# Patient Record
Sex: Female | Born: 1940 | Race: White | Hispanic: No | Marital: Married | Smoking: Former smoker
Health system: Southern US, Community
[De-identification: ages and names within clinical notes are randomized; demographics above are authoritative.]

## PROBLEM LIST (undated history)

## (undated) DIAGNOSIS — M7052 Other bursitis of knee, left knee: Secondary | ICD-10-CM

## (undated) DIAGNOSIS — N189 Chronic kidney disease, unspecified: Secondary | ICD-10-CM

## (undated) DIAGNOSIS — Z974 Presence of external hearing-aid: Secondary | ICD-10-CM

## (undated) DIAGNOSIS — Z87898 Personal history of other specified conditions: Secondary | ICD-10-CM

## (undated) DIAGNOSIS — I10 Essential (primary) hypertension: Secondary | ICD-10-CM

## (undated) DIAGNOSIS — S81002A Unspecified open wound, left knee, initial encounter: Secondary | ICD-10-CM

## (undated) DIAGNOSIS — E785 Hyperlipidemia, unspecified: Secondary | ICD-10-CM

## (undated) DIAGNOSIS — K5730 Diverticulosis of large intestine without perforation or abscess without bleeding: Secondary | ICD-10-CM

## (undated) DIAGNOSIS — K589 Irritable bowel syndrome without diarrhea: Secondary | ICD-10-CM

## (undated) DIAGNOSIS — Z85828 Personal history of other malignant neoplasm of skin: Secondary | ICD-10-CM

## (undated) DIAGNOSIS — E041 Nontoxic single thyroid nodule: Secondary | ICD-10-CM

## (undated) DIAGNOSIS — K219 Gastro-esophageal reflux disease without esophagitis: Secondary | ICD-10-CM

## (undated) DIAGNOSIS — Z9889 Other specified postprocedural states: Secondary | ICD-10-CM

## (undated) DIAGNOSIS — T7840XA Allergy, unspecified, initial encounter: Secondary | ICD-10-CM

## (undated) DIAGNOSIS — M810 Age-related osteoporosis without current pathological fracture: Secondary | ICD-10-CM

## (undated) DIAGNOSIS — Z87440 Personal history of urinary (tract) infections: Secondary | ICD-10-CM

## (undated) DIAGNOSIS — M1990 Unspecified osteoarthritis, unspecified site: Secondary | ICD-10-CM

## (undated) DIAGNOSIS — H269 Unspecified cataract: Secondary | ICD-10-CM

## (undated) HISTORY — PX: POSTERIOR REPAIR: SHX2254

## (undated) HISTORY — DX: Age-related osteoporosis without current pathological fracture: M81.0

## (undated) HISTORY — DX: Allergy, unspecified, initial encounter: T78.40XA

## (undated) HISTORY — PX: EXCISION MORTON'S NEUROMA: SHX5013

## (undated) HISTORY — DX: Irritable bowel syndrome, unspecified: K58.9

## (undated) HISTORY — DX: Chronic kidney disease, unspecified: N18.9

## (undated) HISTORY — PX: TRANSTHORACIC ECHOCARDIOGRAM: SHX275

## (undated) HISTORY — PX: APPENDECTOMY: SHX54

## (undated) HISTORY — PX: SPINE SURGERY: SHX786

## (undated) HISTORY — DX: Unspecified cataract: H26.9

## (undated) HISTORY — DX: Hyperlipidemia, unspecified: E78.5

## (undated) HISTORY — DX: Gastro-esophageal reflux disease without esophagitis: K21.9

## (undated) HISTORY — PX: EYE SURGERY: SHX253

## (undated) HISTORY — PX: KNEE CARTILAGE SURGERY: SHX688

## (undated) HISTORY — DX: Essential (primary) hypertension: I10

## (undated) HISTORY — PX: COLONOSCOPY: SHX174

## (undated) HISTORY — PX: JOINT REPLACEMENT: SHX530

## (undated) HISTORY — PX: TONSILLECTOMY AND ADENOIDECTOMY: SUR1326

---

## 1960-02-06 HISTORY — PX: BREAST BIOPSY: SHX20

## 1977-02-05 HISTORY — PX: VAGINAL HYSTERECTOMY: SUR661

## 1994-02-05 HISTORY — PX: KNEE ARTHROSCOPY W/ ACL RECONSTRUCTION: SHX1858

## 1998-03-29 ENCOUNTER — Ambulatory Visit (HOSPITAL_COMMUNITY): Admission: RE | Admit: 1998-03-29 | Discharge: 1998-03-29 | Payer: Self-pay | Admitting: Obstetrics & Gynecology

## 1999-06-14 ENCOUNTER — Other Ambulatory Visit: Admission: RE | Admit: 1999-06-14 | Discharge: 1999-06-14 | Payer: Self-pay | Admitting: Orthopedic Surgery

## 2002-05-21 ENCOUNTER — Ambulatory Visit (HOSPITAL_COMMUNITY): Admission: RE | Admit: 2002-05-21 | Discharge: 2002-05-21 | Payer: Self-pay | Admitting: Obstetrics and Gynecology

## 2003-03-09 ENCOUNTER — Ambulatory Visit (HOSPITAL_COMMUNITY): Admission: RE | Admit: 2003-03-09 | Discharge: 2003-03-09 | Payer: Self-pay | Admitting: Family Medicine

## 2003-04-29 ENCOUNTER — Ambulatory Visit (HOSPITAL_COMMUNITY): Admission: RE | Admit: 2003-04-29 | Discharge: 2003-04-29 | Payer: Self-pay | Admitting: Obstetrics and Gynecology

## 2006-04-03 ENCOUNTER — Ambulatory Visit: Payer: Self-pay | Admitting: Cardiovascular Disease

## 2007-05-22 ENCOUNTER — Ambulatory Visit: Payer: Self-pay | Admitting: Cardiovascular Disease

## 2008-01-09 ENCOUNTER — Ambulatory Visit: Payer: Self-pay | Admitting: Internal Medicine

## 2008-01-20 ENCOUNTER — Ambulatory Visit: Payer: Self-pay | Admitting: Internal Medicine

## 2010-04-12 ENCOUNTER — Ambulatory Visit (HOSPITAL_COMMUNITY)
Admission: RE | Admit: 2010-04-12 | Discharge: 2010-04-12 | Disposition: A | Discharge status: Home / Self Care | Payer: MEDICARE | Source: Ambulatory Visit | Attending: Family Medicine | Admitting: Family Medicine

## 2010-04-12 DIAGNOSIS — M79609 Pain in unspecified limb: Secondary | ICD-10-CM | POA: Insufficient documentation

## 2010-04-12 DIAGNOSIS — M6281 Muscle weakness (generalized): Secondary | ICD-10-CM | POA: Insufficient documentation

## 2010-04-12 DIAGNOSIS — R262 Difficulty in walking, not elsewhere classified: Secondary | ICD-10-CM | POA: Insufficient documentation

## 2010-04-12 DIAGNOSIS — IMO0001 Reserved for inherently not codable concepts without codable children: Secondary | ICD-10-CM | POA: Insufficient documentation

## 2010-04-19 ENCOUNTER — Ambulatory Visit (HOSPITAL_COMMUNITY)
Admission: RE | Admit: 2010-04-19 | Discharge: 2010-04-19 | Disposition: A | Discharge status: Home / Self Care | Payer: MEDICARE | Source: Ambulatory Visit | Attending: *Deleted | Admitting: *Deleted

## 2010-04-26 ENCOUNTER — Ambulatory Visit (HOSPITAL_COMMUNITY)
Admission: RE | Admit: 2010-04-26 | Discharge: 2010-04-26 | Disposition: A | Discharge status: Home / Self Care | Payer: MEDICARE | Source: Ambulatory Visit | Attending: *Deleted | Admitting: *Deleted

## 2010-05-01 ENCOUNTER — Ambulatory Visit (HOSPITAL_COMMUNITY)
Admission: RE | Admit: 2010-05-01 | Discharge: 2010-05-01 | Disposition: A | Discharge status: Home / Self Care | Payer: MEDICARE | Source: Ambulatory Visit | Attending: *Deleted | Admitting: *Deleted

## 2010-05-08 ENCOUNTER — Ambulatory Visit (HOSPITAL_COMMUNITY)
Admission: RE | Admit: 2010-05-08 | Discharge: 2010-05-08 | Disposition: A | Discharge status: Home / Self Care | Payer: MEDICARE | Source: Ambulatory Visit | Attending: Family Medicine | Admitting: Family Medicine

## 2010-05-08 DIAGNOSIS — M79609 Pain in unspecified limb: Secondary | ICD-10-CM | POA: Insufficient documentation

## 2010-05-08 DIAGNOSIS — R262 Difficulty in walking, not elsewhere classified: Secondary | ICD-10-CM | POA: Insufficient documentation

## 2010-05-08 DIAGNOSIS — IMO0001 Reserved for inherently not codable concepts without codable children: Secondary | ICD-10-CM | POA: Insufficient documentation

## 2010-05-08 DIAGNOSIS — M6281 Muscle weakness (generalized): Secondary | ICD-10-CM | POA: Insufficient documentation

## 2010-06-20 NOTE — Assessment & Plan Note (Signed)
[facility]                       [place] CARDIOLOGY OFFICE NOTE   NAME:[name], [name]                      MRN:          [id number]  DATE:05/22/2007                            DOB:          12-Feb-1940    [name] is seen today in follow up.  She has had previous atypical  chest pain.  She had a cardiac CT at [facility] on Jun 21, 2005, and I was able to finally review the report and get a disk of the  images   She had a right dominant circulation with no significant coronary artery  disease, coronary calcium scoring was not performed.  There were no  significant noncardiac findings. The study was read by Titus Mould.   Since I saw the patient, last, she has had 2 episodes of atypical chest  pain.  They occurred at night.  There were in recumbency.  They may  represent reflux.  They were kind of a sharp, burning-type pain.  They  lasted for less than an hour.   She has not had any problems with exercise, in fact, she and [name] husband  are going to be doing some biking around [employer] soon.  She has otherwise been active.   She has not any significant PND or orthopnea.  No lower extremity edema,  no palpitations or syncope.   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  She has had hypertension.  She has been watching [name] blood  pressure at home and they been stable on low-dose beta blocker; and we  have not had to add medication.   REVIEW OF SYSTEMS:  Otherwise negative.   MEDS INCLUDE:  1. Premarin 0.45 three times a week.  2. Calcium.  3. Glucosamine.  4. Metoprolol 25 b.i.d.  5. Baby aspirin a day.   PHYSICAL EXAMINATION:  [name] exam is remarkable for a healthy-appearing  elderly white female in no distress.  Weight is 128. blood pressure 134/80, pulse 76 and regular, afebrile,  respiratory rate 14.  HEENT:  Unremarkable.  Carotids normal without bruit.  No  lymphadenopathy, no thyromegaly, nor  JVP elevation.  LUNGS:  Clear, good diaphragmatic motion.  No wheezing.  S1 and S2 normal heart sounds.  PMI normal.  ABDOMEN:  Benign bowel sounds.  Positive AAA.  No tenderness.  No bruit.  No hepatosplenomegaly, no hepatojugular reflux.  Distal pulses are intact.  No edema.  NEURO:  Nonfocal.  SKIN:  Warm and dry.  No muscular weakness.   IMPRESSION:  1. Atypical chest pain.  Follow up stress echo.  I told the patient I      do not want to do a Myoview or a CT on [name], due to radiation      dosages.  I would also like to see which she does with exercise.      She has had some nonspecific lateral ST-segment changes in the      past, and she will need an imaging modality; and, I think, echo is      the  most reasonable  2. History of supraventricular tachycardia, quiescent.  Continue beta      blockers.  3. Borderline hypertension, currently stable.  Continue beta blocker      may need to add ACE inhibitor in the future.   So long as [name] stress echo is normal, she will be seen back in a year.     Noralyn Pick. Eden Emms, MD, [facility]  Electronically Signed    PCN/MedQ  DD: 05/22/2007  DT: 05/22/2007  Job #: [id number]

## 2010-06-23 NOTE — Assessment & Plan Note (Signed)
[facility]                       [place] CARDIOLOGY OFFICE NOTE   NAME:HAIGHTEmmilia, Sowder                      MRN:          [id number]  DATE:04/03/2006                            DOB:          08-21-1940    Ms. Salih is seen today as a new consult to me.  She is referred by Dr.  Lilyan Punt and Dr. Stephani Police, where she works at [employer].  She has previously been seen approximately 3 years ago by  Dr. Dorethea Clan.  She has history of palpitations and a brief episode of SVT.  She has been on longstanding beta blockers.  The patient is very  intelligent.  She had multiple questions today.  Her palpitations only  occur maybe once every month or two.  She has had a previous Holter  monitor.  She feels that her palpitations are stable.  She has not had  any syncope, chest pain, PND, or orthopnea.   I talked to her at length about new technology including CardioNet  monitoring should her palpitations increase in frequency.  She asked me  if her beta blocker was at a correct dose, and I told her since her  resting heart rate was in the 60s, I thought that it was.   Coronary risk factors include borderline hypertension.   FAMILY HISTORY:  Negative.   She is a nondiabetic and nonsmoker.   The patient otherwise has been doing well.   PAST MEDICAL HISTORY:  Remarkable for:  1. Some diverticulitis.  2. Previous breast biopsy.  3. Tonsillectomy.  4. She had a right ACL repair in 1996.  5. Previous hysterectomy.  6. Appendectomy.   SHE HAS NO KNOWN ALLERGIES.   REVIEW OF SYSTEMS:  As indicated above with no syncope, stable  palpitations, and no chest pain.   Her medicines include:  1. Premarin 0.5 mg a day.  2. Calcium.  3. Glucosamine.  4. Lopressor 25 b.i.d.   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  She is happily  married.  She has 2 older children in their 73s who are doing well.  She  continues to be active.   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   PHYSICAL EXAMINATION:  She looks well.  Blood pressure is 130/80.  Pulse is 68 and regular.  HEENT:  Normal.  LUNGS:  Clear.  Carotids are normal without bruit.  There is no lymphadenopathy.  ABDOMEN:  Benign.  There is no hepatosplenomegaly.  No AAA.  Distal pulses intact with no edema.  She is status post appendectomy, hysterectomy and right knee surgery.   IMPRESSION:  The patient's palpitations appear benign.  She has a  documented episode of supraventricular tachycardia by Holter previously.  She will continue her beta blocker at her current dose.  I told her she  could switch to Toprol 50 if she wanted a once-a-day drug, however, this  was not generic.   The patient appears to be borderline hypertensive.  She had some blood  pressure readings at home that are also borderline.  I told her to cut  out the salt in her diet.  She will increase her activity levels.  If  she runs over 150/90 consistently, I think she should probably be  started on Lisinopril 10 mg or Altace 5 mg.  She will call us after  monitoring her blood pressures at home.   The patient did have a cardiac CTA and calcium scoring with Dr. Katrinka Blazing  over at [facility] last fall.  I suspect that she was one of  the test patients when they started their program.  I asked her what her  calcium score was, and she did not know.  She will try to bring the  report by for me to review.  I told her if she really wanted to know  what her 5 and 10 year prognosis was, that the cardiac CTA would be the  best exam for me to be able to tell her this.   She will have fasting lipid profile checked here in our office, as this  has not been done in over 4 years.  I did tell her it would probably be  reasonable for her to take a baby aspirin a day for cardiovascular and  stroke prophylaxis.   It was a pleasure meeting Ms.  Bostock today.     Noralyn Pick. Eden Emms, MD, [facility]  Electronically Signed    PCN/MedQ  DD: 04/03/2006  DT: 04/03/2006  Job #: [id number]

## 2010-06-23 NOTE — Procedures (Signed)
NAME:  SHARETA, FISHBAUGH                         ACCOUNT NO.:  [id number]   MEDICAL RECORD NO.:  [id number]                   PATIENT TYPE:  OUT   LOCATION:  RAD                                  FACILITY:  [facility]   PHYSICIAN:  [place] Bing, M.D.               DATE OF BIRTH:  08-05-1940   DATE OF PROCEDURE:  DATE OF DISCHARGE:                                  ECHOCARDIOGRAM   REFERRING PHYSICIAN:  1. Dr. Lorin Picket A. Luking.  2. Dr. Tressia Miners.   CLINICAL DATA:  A 70 year old woman with chest pain.   M-MODE:  1. Aorta 2.8.  2. Left atrium 3.8.  3. Septum 1.2.  4. Posterior wall 1.0.  5. LV diastole 3.5.  6. LV systole 2.6.   FINDINGS:  1. Technically adequate echocardiographic study.  2. Normal left atrium, right atrium, and right ventricle.  3. Mild aortic valvular sclerosis and annular calcification.  4. Normal valve function.  5. Normal tricuspid and mitral valve; very mild mitral annular     calcification; trivial tricuspid regurgitation.  6. Normal internal dimension of the left ventricle; borderline LVH.  7. Normal regional and global LV systolic function.  8. Normal inferior vena cava.      ___________________________________________                                            [place] Bing, M.D.   RR/MEDQ  D:  04/29/2003  T:  04/29/2003  Job:  [id number]

## 2010-06-23 NOTE — Op Note (Signed)
   NAME:  [name], [name]                         ACCOUNT NO.:  [id number]   MEDICAL RECORD NO.:  [id number]                   PATIENT TYPE:  AMB   LOCATION:  DAY                                  FACILITY:  [facility]   PHYSICIAN:  Tilda Burrow, M.D.              DATE OF BIRTH:  [birth year]   DATE OF PROCEDURE:  DATE OF DISCHARGE:                                 OPERATIVE REPORT   PREOPERATIVE DIAGNOSIS:  Rectocele.   POSTOPERATIVE DIAGNOSIS:  Rectocele.   OPERATION/PROCEDURE:  Posterior repair.   ASSISTANT:  None.   ANESTHESIA:  General.   COMPLICATIONS:  None.   FINDINGS:  A small high rectocele easily reapproximated.   DESCRIPTION OF PROCEDURE:  The patient was prepped and draped for vaginal  procedure with the legs in the candy cane supports.  The vaginal epithelium  was dissected off the underlying connective tissue with a doubly gloved  right index finger placed into the rectum.  We were able to identify good  perirectal support tissue that could be pulled out with a series of  interrupted 0 Dexon pop off sutures to reapproximate and rebuild the  peroneal body.  These were tied down with a series of approximately eight  stitches.  This resulted in good peroneal body allotment.  We then proceeded  to rebuild the peroneal body itself and reduce the introitus size by  dissecting beneath the skin at the level of the hymen remnants, identify  tissues adjacent to the bulbo cavernosus muscles and pulling together also  with 0 Dexon pop off sutures.  The vaginal mucosa was trimmed slightly and  then reapproximated.  There was excellent hemostasis and no vaginal packing  was necessary and the patient had only in and out catheterization.  She will  be allowed to go home today.                                                Tilda Burrow, M.D.    JVF/MEDQ  D:  05/21/2002  T:  05/21/2002  Job:  [id number]

## 2010-06-23 NOTE — Procedures (Signed)
NAME:  [name], [name] NO.:  [id number]   MEDICAL RECORD NO.:  [id number]                   PATIENT TYPE:  OUT   LOCATION:  DFTL                                 FACILITY:  [facility]   PHYSICIAN:  Donna Bernard, M.D.             DATE OF BIRTH:  1940-10-30   DATE OF PROCEDURE:  DATE OF DISCHARGE:  03/09/2003                                    STRESS TEST   INDICATION FOR TEST:  Chest discomfort in a patient with a positive family  history for heart disease.   STRESS TEST:  Stress test was performed at standard __________ protocol.  Resting EKG revealed normal sinus rhythm with no significant ST-T changes.  The patient tolerated the first stage well.  There was normal hypertensive  response.  On into the second stage, the patient reached and surpassed her  __________ predicted heart rate of 133.  The patient, in fact, achieved a  maximum heart rate of 160.  This was achieved right at the cessation of the  test at the start of the third stage.  At 0.08 seconds past the J point,  there were no significant ST segment changes noted.  In areas where some  depression was evident the slope was nicely ascending.  During the rest  phase the ST segments remained within normal limits.   IMPRESSION:  Negative adequate stress test.   PLAN:  The patient was encouraged to get with exercise program.      ___________________________________________                                            Donna Bernard, M.D.   WSL/MEDQ  D:  03/16/2003  T:  03/17/2003  Job:  [id number]

## 2010-06-23 NOTE — Op Note (Signed)
NAME:  [name], [name]                         ACCOUNT NO.:  [id number]   MEDICAL RECORD NO.:  [id number]                   PATIENT TYPE:  AMB   LOCATION:  DAY                                  FACILITY:  [facility]   PHYSICIAN:  Tilda Burrow, M.D.              DATE OF BIRTH:  1940-11-03   DATE OF PROCEDURE:  05/21/2002  DATE OF DISCHARGE:                                 OPERATIVE REPORT   PREOPERATIVE DIAGNOSIS:  Rectocele.   POSTOPERATIVE DIAGNOSIS:  Rectocele.   PROCEDURE:  Posterior vaginal repair.   SURGEON:  Tilda Burrow, M.D.   ASSISTANTCurley Spice.   ANESTHESIA:  General with laryngeal mask anesthesia.   COMPLICATIONS:  None.   FINDINGS:  A small, high rectocele, corrected nicely.   DESCRIPTION OF PROCEDURE:  The patient was taken to the operating room and  prepped and draped for vaginal procedure with legs in the candy cane  support, with general anesthesia easily introduced.  A scopolamine patch was  placed to assist with postoperative nausea due to prior experiences.   After prepping and draping of the vagina, an Allis clamp was used to grasp  the hymen remnants on either side of the old episiotomy.  The posterior  vaginal epithelium was infiltrated with Xylocaine with epinephrine and a  transverse incision made at the level of the hymen remnant.  We then  dissected the vaginal mucosa off the underlying connective tissue and  rectocele.  Digital rectal exam was then performed with a double-gloved  right index finger and then Allis clamps used to grasp the lateral  pararectal connective tissue on either side that could be pulled into the  midline and overlie the rectocele defect.  These were pulled together with a  series of interrupted 0 Dexon sutures.  The gloved hand was removed and  changed and then tied down with good posterior perineal support achieved.  Vaginal apex was soft and flexible.  Vaginal length was not shortened.   Introitus was reduced in  size somewhat with the placement of two additional  stitches at the level of the hymen remnant that pulled together tissue from  either side of the old episiotomy scar.  The procedure was so hemostatic  that vaginal packing was not warranted.  The vaginal epithelium was pulled  together with interrupted 2-0 chromic sutures and the procedure considered  completed successfully.  In-and-out catheterization obtained approximately  100 mL of clear urine.  The patient will go home from day surgery.   Discharge medications include Tylox x15 tablets and doxycycline 100 mg  b.i.d. for seven days.  Follow-up in two weeks.                                               Tilda Burrow,  M.D.    JVF/MEDQ  D:  05/21/2002  T:  05/21/2002  Job:  [id number]

## 2010-06-23 NOTE — H&P (Signed)
NAME:  [name], [name] NO.:  [id number]   MEDICAL RECORD NO.:  [id number]                  PATIENT TYPE:   LOCATION:                                       FACILITY:   PHYSICIAN:  Tilda Burrow, M.D.              DATE OF BIRTH:  12/31/1940   DATE OF ADMISSION:  05/20/2002  DATE OF DISCHARGE:                                HISTORY & PHYSICAL   REASON FOR ADMISSION:  Rectocele repair 05/20/2002.   HISTORY OF PRESENT ILLNESS:  This 70 year old female, postmenopausal, status  post vaginal hysterectomy at age 51, admitted at this time for correction of  longstanding rectocele which has become progressively more symptomatic.  The  patient was referred to our office through the courtesy of Dr. Lacretia Nicks. Simone Curia originally in the year 2000.  She chose not to pursue referral and  evaluation for correction at that time, but after three years decided that  the symptoms of splinting and difficulty with rectal bleeding and discomfort  with bowel movements had reached the point of necessitating repair.  The  patient was seen and evaluated, confirmed that the rectocele exists, and  history seems completely consistent with symptomatic rectocele.  Additionally, the patient had colonoscopy which was negative except for  currently inactive sigmoid diverticulosis, not bleeding at this time.   PAST MEDICAL HISTORY:  Benign.   PAST SURGICAL HISTORY:  1. Tonsillectomy at age 41.  2. Appendectomy at age 79.  3. Medial meniscectomy of right knee at age 60.  40. Total hysterectomy at age 66.  5. ACL replacement of right knee at age 58.   ALLERGIES:  PENICILLIN and some PAIN MEDICINES.   MEDICATIONS:  Premarin 0.625 mg p.o. every 3 days.   SOCIAL HISTORY:  The patient is active, employed at [employer].  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   PHYSICAL EXAMINATION:  VITAL SIGNS:  Height 5 feet  1 inch, weight 126-1/2  pounds.  Blood pressure 128/70, pulse 76,  HEENT:  Pupils are equal, round, and reactive.  Extraocular movements  intact.  NECK: Supple, trachea midline.  CHEST: Clear to auscultation.  BREASTS:  Exam deferred.  HEART:  Benign.  ABDOMEN:  Slim without masses.  PELVIC:  External genitalia shows agglutination of the clitoral hood  entirely consistent with patient's complaints of decreased responsiveness.  Vaginal exam shows normal tissue with right introitus.  Cervix and uterus  are absent.  Vaginal length slightly shortened status post hysterectomy.  Adnexa negative for masses or tenderness.  RECTAL:  Rectocele warranting repair.   IMPRESSION:  1. Symptomatic rectocele.  2. History of stable sigmoid colon diverticulosis.  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   PLAN:  Posterior repair.   Lengthy discussion of procedure with specific discussion of technique  including risks of procedure including abdominal  perforation, infection,  complication warranting further repair all discussed with the patient.  She  left the area completely satisfied with apparent understanding of the  procedure and its inherent risks and potential complications.                                               Tilda Burrow, M.D.    JVF/MEDQ  D:  05/18/2002  T:  05/18/2002  Job:  [id number]   cc:   Donna Bernard, M.D.  [address]  [place]  [place] [address]  Fax: [phone]

## 2011-06-14 ENCOUNTER — Encounter (HOSPITAL_COMMUNITY): Payer: Self-pay

## 2011-06-18 NOTE — Patient Instructions (Addendum)
20 [name]  06/18/2011   Your procedure is scheduled on: 5.20.13  [facility] to [facility] [place] [facility].  Call this number if you have problems the morning of surgery: [phone]   Remember:   Do not eat food:After Midnight.  May have clear liquids:until Midnight .  Clear liquids include soda, tea, black coffee, apple or grape juice, broth.  Take these medicines the morning of surgery with A SIP OF WATER: lopressor   Do not wear jewelry, make-up or nail polish.  Do not wear lotions, powders, or perfumes. You may wear deodorant.  Do not shave 48 hours prior to surgery.  Do not bring valuables to the hospital.  Contacts, dentures or bridgework may not be worn into surgery.  Leave suitcase in the car. After surgery it may be brought to your room.  For patients admitted to the hospital, checkout time is 11:00 AM the day of discharge.   Patients discharged the day of surgery will not be allowed to drive home.  Name and phone number of your driver: family  Special Instructions: N/A   Please read over the following fact sheets that you were given: Pain Booklet, Anesthesia Post-op Instructions and Care and Recovery After Surgery   PATIENT INSTRUCTIONS POST-ANESTHESIA  IMMEDIATELY FOLLOWING SURGERY:  Do not drive or operate machinery for the first twenty four hours after surgery.  Do not make any important decisions for twenty four hours after surgery or while taking narcotic pain medications or sedatives.  If you develop intractable nausea and vomiting or a severe headache please notify your doctor immediately.  FOLLOW-UP:  Please make an appointment with your surgeon as instructed. You do not need to follow up with anesthesia unless specifically instructed to do so.  WOUND CARE INSTRUCTIONS (if applicable):  Keep a dry clean dressing on the anesthesia/puncture wound site if there is drainage.  Once the wound has quit draining you may leave it open to air.  Generally you should leave the  bandage intact for twenty four hours unless there is drainage.  If the epidural site drains for more than 36-48 hours please call the anesthesia department.  QUESTIONS?:  Please feel free to call your physician or the hospital operator if you have any questions, and they will be happy to assist you.     [facility] Department [address] [place] [phone]    Cataract A cataract is a clouding of the lens of the eye. When a lens becomes cloudy, vision is reduced based on the degree and nature of the clouding. Many cataracts reduce vision to some degree. Some cataracts make people more near-sighted as they develop. Other cataracts increase glare. Cataracts that are ignored and become worse can sometimes look white. The white color can be seen through the pupil. CAUSES   Aging. However, cataracts may occur at any age, even in newborns.   Certain drugs.   Trauma to the eye.   Certain diseases such as diabetes.   Specific eye diseases such as chronic inflammation inside the eye or a sudden attack of a rare form of glaucoma.   Inherited or acquired medical problems.  SYMPTOMS   Gradual, progressive drop in vision in the affected eye.   Severe, rapid visual loss. This most often happens when trauma is the cause.  DIAGNOSIS  To detect a cataract, an eye doctor examines the lens. Cataracts are best diagnosed with an exam of the eyes with the pupils enlarged (dilated) by  drops.  TREATMENT  For an early cataract, vision may improve by using different eyeglasses or stronger lighting. If that does not help your vision, surgery is the only effective treatment. A cataract needs to be surgically removed when vision loss interferes with your everyday activities, such as driving, reading, or watching TV. A cataract may also have to be removed if it prevents examination or treatment of another eye problem. Surgery removes the cloudy lens and usually replaces it with a  substitute lens (intraocular lens, IOL).  At a time when both you and your doctor agree, the cataract will be surgically removed. If you have cataracts in both eyes, only one is usually removed at a time. This allows the operated eye to heal and be out of danger from any possible problems after surgery (such as infection or poor wound healing). In rare cases, a cataract may be doing damage to your eye. In these cases, your caregiver may advise surgical removal right away. The vast majority of people who have cataract surgery have better vision afterward. HOME CARE INSTRUCTIONS  If you are not planning surgery, you may be asked to do the following:  Use different eyeglasses.   Use stronger or brighter lighting.   Ask your eye doctor about reducing your medicine dose or changing medicines if it is thought that a medicine caused your cataract. Changing medicines does not make the cataract go away on its own.   Become familiar with your surroundings. Poor vision can lead to injury. Avoid bumping into things on the affected side. You are at a higher risk for tripping or falling.   Exercise extreme care when driving or operating machinery.   Wear sunglasses if you are sensitive to bright light or experiencing problems with glare.  SEEK IMMEDIATE MEDICAL CARE IF:   You have a worsening or sudden vision loss.   You notice redness, swelling, or increasing pain in the eye.   You have a fever.  Document Released: 01/22/2005 Document Revised: 01/11/2011 Document Reviewed: 09/15/2010 [facility] Patient Information 2012 [place], [place].

## 2011-06-19 ENCOUNTER — Other Ambulatory Visit: Payer: Self-pay

## 2011-06-19 ENCOUNTER — Encounter (HOSPITAL_COMMUNITY): Payer: Self-pay

## 2011-06-19 ENCOUNTER — Encounter (HOSPITAL_COMMUNITY)
Admission: RE | Admit: 2011-06-19 | Discharge: 2011-06-19 | Disposition: A | Discharge status: Home / Self Care | Payer: Medicare Other | Source: Ambulatory Visit | Attending: Ophthalmology | Admitting: Ophthalmology

## 2011-06-19 HISTORY — DX: Unspecified osteoarthritis, unspecified site: M19.90

## 2011-06-19 LAB — BASIC METABOLIC PANEL
CO2: 29 mEq/L (ref 19–32)
Chloride: 97 mEq/L (ref 96–112)
Creatinine, Ser: 0.63 mg/dL (ref 0.50–1.10)
GFR calc Af Amer: >90 mL/min (ref >90)
Potassium: 4.9 mEq/L (ref 3.5–5.1)
Sodium: 140 mEq/L (ref 135–145)

## 2011-06-19 LAB — HEMOGLOBIN AND HEMATOCRIT, BLOOD
HCT: 41.2 % (ref 36.0–46.0)
Hemoglobin: 14 g/dL (ref 12.0–15.0)

## 2011-06-19 MED ORDER — CYCLOPENTOLATE-PHENYLEPHRINE 0.2-1 % OP SOLN: 1.0000 [drp] | OPHTHALMIC | Status: DC

## 2011-06-19 MED ORDER — CYCLOPENTOLATE-PHENYLEPHRINE 0.2-1 % OP SOLN
OPHTHALMIC | Status: AC
Start: 2011-06-19 — End: 2011-06-19
  Filled 2011-06-19: qty 2

## 2011-06-25 ENCOUNTER — Encounter (HOSPITAL_COMMUNITY)
Admission: RE | Disposition: A | Discharge status: Home / Self Care | Payer: Self-pay | Source: Ambulatory Visit | Attending: Ophthalmology

## 2011-06-25 ENCOUNTER — Encounter (HOSPITAL_COMMUNITY): Payer: Self-pay | Admitting: Anesthesiology

## 2011-06-25 ENCOUNTER — Ambulatory Visit (HOSPITAL_COMMUNITY): Payer: Medicare Other | Admitting: Anesthesiology

## 2011-06-25 ENCOUNTER — Encounter (HOSPITAL_COMMUNITY): Payer: Self-pay | Admitting: *Deleted

## 2011-06-25 ENCOUNTER — Ambulatory Visit (HOSPITAL_COMMUNITY)
Admission: RE | Admit: 2011-06-25 | Discharge: 2011-06-25 | Disposition: A | Discharge status: Home / Self Care | Payer: Medicare Other | Source: Ambulatory Visit | Attending: Ophthalmology | Admitting: Ophthalmology

## 2011-06-25 DIAGNOSIS — I10 Essential (primary) hypertension: Secondary | ICD-10-CM | POA: Insufficient documentation

## 2011-06-25 DIAGNOSIS — Z01810 Encounter for preprocedural cardiovascular examination: Secondary | ICD-10-CM | POA: Insufficient documentation

## 2011-06-25 DIAGNOSIS — H2510 Age-related nuclear cataract, unspecified eye: Secondary | ICD-10-CM | POA: Insufficient documentation

## 2011-06-25 DIAGNOSIS — Z79899 Other long term (current) drug therapy: Secondary | ICD-10-CM | POA: Insufficient documentation

## 2011-06-25 DIAGNOSIS — Z01812 Encounter for preprocedural laboratory examination: Secondary | ICD-10-CM | POA: Insufficient documentation

## 2011-06-25 HISTORY — PX: CATARACT EXTRACTION W/PHACO: SHX586

## 2011-06-25 SURGERY — PHACOEMULSIFICATION, CATARACT, WITH IOL INSERTION
Anesthesia: Monitor Anesthesia Care | Site: Eye | Laterality: Right | Wound class: Clean

## 2011-06-25 MED ORDER — LACTATED RINGERS IV SOLN
INTRAVENOUS | Status: DC
  Administered 2011-06-25: 1000 mL via INTRAVENOUS

## 2011-06-25 MED ORDER — EPINEPHRINE HCL 1 MG/ML IJ SOLN
INTRAOCULAR | Status: DC | PRN
  Administered 2011-06-25: 08:00:00

## 2011-06-25 MED ORDER — TETRACAINE 0.5 % OP SOLN OPTIME - NO CHARGE
OPHTHALMIC | Status: DC | PRN
  Administered 2011-06-25: 2 [drp] via OPHTHALMIC

## 2011-06-25 MED ORDER — MIDAZOLAM HCL 2 MG/2ML IJ SOLN
1.0000 mg | INTRAMUSCULAR | Status: DC | PRN
  Administered 2011-06-25: 2 mg via INTRAVENOUS

## 2011-06-25 MED ORDER — LIDOCAINE 3.5 % OP GEL OPTIME - NO CHARGE
OPHTHALMIC | Status: DC | PRN
Start: 2011-06-25 — End: 2011-06-25
  Administered 2011-06-25: 2 [drp] via OPHTHALMIC

## 2011-06-25 MED ORDER — MIDAZOLAM HCL 2 MG/2ML IJ SOLN
INTRAMUSCULAR | Status: AC
  Administered 2011-06-25: 2 mg via INTRAVENOUS
  Filled 2011-06-25: qty 2

## 2011-06-25 MED ORDER — MOXIFLOXACIN HCL 0.5 % OP SOLN - NO CHARGE
1.0000 [drp] | Freq: Once | OPHTHALMIC | Status: DC
  Filled 2011-06-25: qty 3

## 2011-06-25 MED ORDER — TETRACAINE HCL 0.5 % OP SOLN: 1.0000 [drp] | Freq: Once | OPHTHALMIC | Status: DC

## 2011-06-25 MED ORDER — FENTANYL CITRATE 0.05 MG/ML IJ SOLN
INTRAMUSCULAR | Status: AC
  Administered 2011-06-25: 25 ug via INTRAVENOUS
  Filled 2011-06-25: qty 2

## 2011-06-25 MED ORDER — FENTANYL CITRATE 0.05 MG/ML IJ SOLN
INTRAMUSCULAR | Status: AC
  Filled 2011-06-25: qty 2

## 2011-06-25 MED ORDER — KETOROLAC TROMETHAMINE 0.4 % OP SOLN - NO CHARGE
1.0000 [drp] | Freq: Once | OPHTHALMIC | Status: AC
  Administered 2011-06-25: 1 [drp] via OPHTHALMIC
  Filled 2011-06-25: qty 5

## 2011-06-25 MED ORDER — EPINEPHRINE HCL 1 MG/ML IJ SOLN
INTRAMUSCULAR | Status: AC
  Filled 2011-06-25: qty 1

## 2011-06-25 MED ORDER — BSS IO SOLN
INTRAOCULAR | Status: DC | PRN
  Administered 2011-06-25: 15 mL via INTRAOCULAR

## 2011-06-25 MED ORDER — GATIFLOXACIN 0.5 % OP SOLN OPTIME - NO CHARGE
OPHTHALMIC | Status: DC | PRN
  Administered 2011-06-25: 1 [drp] via OPHTHALMIC

## 2011-06-25 MED ORDER — NA HYALUR & NA CHOND-NA HYALUR 0.55-0.5 ML IO KIT
PACK | INTRAOCULAR | Status: DC | PRN
  Administered 2011-06-25: 1 via OPHTHALMIC

## 2011-06-25 MED ORDER — TETRACAINE HCL 0.5 % OP SOLN
OPHTHALMIC | Status: AC
  Filled 2011-06-25: qty 2

## 2011-06-25 MED ORDER — GATIFLOXACIN 0.5 % OP SOLN OPTIME - NO CHARGE
1.0000 [drp] | Freq: Once | OPHTHALMIC | Status: AC
  Administered 2011-06-25: 1 [drp] via OPHTHALMIC
  Filled 2011-06-25: qty 2.5

## 2011-06-25 MED ORDER — LACTATED RINGERS IV SOLN
INTRAVENOUS | Status: DC | PRN
  Administered 2011-06-25: 07:00:00 via INTRAVENOUS

## 2011-06-25 MED ORDER — CYCLOPENTOLATE-PHENYLEPHRINE 0.2-1 % OP SOLN
1.0000 [drp] | Freq: Once | OPHTHALMIC | Status: AC
  Administered 2011-06-25: 1 [drp] via OPHTHALMIC

## 2011-06-25 MED ORDER — FENTANYL CITRATE 0.05 MG/ML IJ SOLN
25.0000 ug | INTRAMUSCULAR | Status: DC | PRN
  Administered 2011-06-25: 25 ug via INTRAVENOUS

## 2011-06-25 SURGICAL SUPPLY — 27 items
CAPSULAR TENSION RING-AMO (OPHTHALMIC RELATED) IMPLANT
CLOTH BEACON ORANGE TIMEOUT ST (SAFETY) ×1 IMPLANT
GLOVE BIO SURGEON STRL SZ7.5 (GLOVE) ×1 IMPLANT
GLOVE BIOGEL M 6.5 STRL (GLOVE) IMPLANT
GLOVE BIOGEL PI IND STRL 6.5 (GLOVE) IMPLANT
GLOVE BIOGEL PI IND STRL 7.0 (GLOVE) IMPLANT
GLOVE BIOGEL PI INDICATOR 6.5 (GLOVE)
GLOVE BIOGEL PI INDICATOR 7.0 (GLOVE)
GLOVE ECLIPSE 6.5 STRL STRAW (GLOVE) IMPLANT
GLOVE ECLIPSE 7.5 STRL STRAW (GLOVE) IMPLANT
GLOVE EXAM NITRILE LRG STRL (GLOVE) IMPLANT
GLOVE EXAM NITRILE MD LF STRL (GLOVE) ×1 IMPLANT
GLOVE SKINSENSE NS SZ6.5 (GLOVE)
GLOVE SKINSENSE NS SZ7.0 (GLOVE)
GLOVE SKINSENSE STRL SZ6.5 (GLOVE) IMPLANT
GLOVE SKINSENSE STRL SZ7.0 (GLOVE) IMPLANT
INST SET CATARACT ~~LOC~~ (KITS) ×2 IMPLANT
KIT VITRECTOMY (OPHTHALMIC RELATED) IMPLANT
PAD ARMBOARD 7.5X6 YLW CONV (MISCELLANEOUS) ×1 IMPLANT
PROC W NO LENS (INTRAOCULAR LENS)
PROC W SPEC LENS (INTRAOCULAR LENS)
PROCESS W NO LENS (INTRAOCULAR LENS) IMPLANT
PROCESS W SPEC LENS (INTRAOCULAR LENS) IMPLANT
RING MALYGIN (MISCELLANEOUS) IMPLANT
SIGHTPATH CAT PROC W REG LENS (Ophthalmic Related) ×2 IMPLANT
VISCOELASTIC ADDITIONAL (OPHTHALMIC RELATED) IMPLANT
WATER STERILE IRR 250ML POUR (IV SOLUTION) ×1 IMPLANT

## 2011-06-25 NOTE — H&P (Signed)
I have reviewed the pre printed H&P, the patient was re-examined, and I have identified no significant interval changes in the patient's medical condition.  There is no change in the plan of care since the history and physical of record. 

## 2011-06-25 NOTE — Anesthesia Postprocedure Evaluation (Signed)
  Anesthesia Post-op Note  Patient: [name]  Procedure(s) Performed: Procedure(s) (LRB): CATARACT EXTRACTION PHACO AND INTRAOCULAR LENS PLACEMENT (IOC) (Right)  Patient Location: PACU and Short Stay  Anesthesia Type: MAC  Level of Consciousness: awake, alert  and oriented  Airway and Oxygen Therapy: Patient Spontanous Breathing  Post-op Pain: none  Post-op Assessment: Post-op Vital signs reviewed, Patient's Cardiovascular Status Stable, Respiratory Function Stable, Patent Airway, No signs of Nausea or vomiting and Pain level controlled  Post-op Vital Signs: Reviewed  Complications: No apparent anesthesia complications

## 2011-06-25 NOTE — Op Note (Signed)
See scanned op note dated today  

## 2011-06-25 NOTE — Preoperative (Signed)
Beta Blockers   Reason not to administer Beta Blockers:Not Applicable 

## 2011-06-25 NOTE — Anesthesia Preprocedure Evaluation (Signed)
Anesthesia Evaluation  Patient identified by MRN, date of birth, ID band Patient awake    Reviewed: Allergy & Precautions, H&P , NPO status , Patient's Chart, lab work & pertinent test results, reviewed documented beta blocker date and time   History of Anesthesia Complications Negative for: history of anesthetic complications  Airway Mallampati: I      Dental  (+) Teeth Intact   Pulmonary neg pulmonary ROS,  breath sounds clear to auscultation        Cardiovascular hypertension, Pt. on medications + dysrhythmias Supra Ventricular Tachycardia Rhythm:Regular Rate:Bradycardia     Neuro/Psych    GI/Hepatic negative GI ROS,   Endo/Other    Renal/GU      Musculoskeletal   Abdominal   Peds  Hematology   Anesthesia Other Findings   Reproductive/Obstetrics                           Anesthesia Physical Anesthesia Plan  ASA: II  Anesthesia Plan: MAC   Post-op Pain Management:    Induction: Intravenous  Airway Management Planned: Nasal Cannula  Additional Equipment:   Intra-op Plan:   Post-operative Plan:   Informed Consent: I have reviewed the patients History and Physical, chart, labs and discussed the procedure including the risks, benefits and alternatives for the proposed anesthesia with the patient or authorized representative who has indicated his/her understanding and acceptance.     Plan Discussed with:   Anesthesia Plan Comments:         Anesthesia Quick Evaluation  

## 2011-06-25 NOTE — Transfer of Care (Signed)
Immediate Anesthesia Transfer of Care Note  Patient: [name]  Procedure(s) Performed: Procedure(s) (LRB): CATARACT EXTRACTION PHACO AND INTRAOCULAR LENS PLACEMENT (IOC) (Right)  Patient Location: PACU and Short Stay  Anesthesia Type: MAC  Level of Consciousness: awake, alert  and oriented  Airway & Oxygen Therapy: Patient Spontanous Breathing  Post-op Assessment: Report given to PACU RN  Post vital signs: Reviewed  Complications: No apparent anesthesia complications

## 2011-06-25 NOTE — Brief Op Note (Cosign Needed)
06/25/2011  8:10 AM  PATIENT:  [name]  71 y.o. female  PRE-OPERATIVE DIAGNOSIS:  nuclear cataract right eye  POST-OPERATIVE DIAGNOSIS:  nuclear cataract right eye  PROCEDURE:  Procedure(s): CATARACT EXTRACTION PHACO AND INTRAOCULAR LENS PLACEMENT (IOC)  SURGEON:  Surgeon(s): Susa Simmonds, MD  ASSISTANTS: Marya Landry, CST   ANESTHESIA STAFF: Kristopher Key, CRNA - CRNA Laurene Footman, MD - Anesthesiologist  ANESTHESIA:   topical and MAC  REQUESTED LENS POWER: 21.0 D  LENS IMPLANT INFORMATION: Alcon SN60WF  S/n [id number]  Exp  03/2015  CUMULATIVE DISSIPATED ENERGY:8.90  INDICATIONS:see H&P  OP FINDINGS:dense nuclear sclerotic cataract  COMPLICATIONS:None  DICTATION #: none  PLAN OF CARE: see office notes-pt. instructions  PATIENT DISPOSITION:  Short Stay

## 2011-06-28 ENCOUNTER — Encounter (HOSPITAL_COMMUNITY): Payer: Self-pay | Admitting: Ophthalmology

## 2012-01-07 ENCOUNTER — Other Ambulatory Visit: Payer: Self-pay | Admitting: Family Medicine

## 2012-01-07 ENCOUNTER — Ambulatory Visit (HOSPITAL_COMMUNITY)
Admission: RE | Admit: 2012-01-07 | Discharge: 2012-01-07 | Disposition: A | Discharge status: Home / Self Care | Payer: Medicare Other | Source: Ambulatory Visit | Attending: Family Medicine | Admitting: Family Medicine

## 2012-01-07 DIAGNOSIS — M79669 Pain in unspecified lower leg: Secondary | ICD-10-CM

## 2012-01-07 DIAGNOSIS — M79642 Pain in left hand: Secondary | ICD-10-CM

## 2012-01-07 DIAGNOSIS — M19049 Primary osteoarthritis, unspecified hand: Secondary | ICD-10-CM | POA: Insufficient documentation

## 2012-01-07 DIAGNOSIS — M79609 Pain in unspecified limb: Secondary | ICD-10-CM | POA: Insufficient documentation

## 2012-04-25 ENCOUNTER — Encounter (HOSPITAL_COMMUNITY): Payer: Self-pay | Admitting: Pharmacy Technician

## 2012-04-28 NOTE — Patient Instructions (Addendum)
Your procedure is scheduled on: 05/05/2012  Report to [facility] at  615    AM.  Call this number if you have problems the morning of surgery: [phone]   Do not eat food or drink liquids :After Midnight.      Take these medicines the morning of surgery with A SIP OF WATER: lopressor   Do not wear jewelry, make-up or nail polish.  Do not wear lotions, powders, or perfumes.   Do not shave 48 hours prior to surgery.  Do not bring valuables to the hospital.  Contacts, dentures or bridgework may not be worn into surgery.  Leave suitcase in the car. After surgery it may be brought to your room.  For patients admitted to the hospital, checkout time is 11:00 AM the day of discharge.   Patients discharged the day of surgery will not be allowed to drive home.  :     Please read over the following fact sheets that you were given: Coughing and Deep Breathing, Surgical Site Infection Prevention, Anesthesia Post-op Instructions and Care and Recovery After Surgery    Cataract A cataract is a clouding of the lens of the eye. When a lens becomes cloudy, vision is reduced based on the degree and nature of the clouding. Many cataracts reduce vision to some degree. Some cataracts make people more near-sighted as they develop. Other cataracts increase glare. Cataracts that are ignored and become worse can sometimes look white. The white color can be seen through the pupil. CAUSES   Aging. However, cataracts may occur at any age, even in newborns.   Certain drugs.   Trauma to the eye.   Certain diseases such as diabetes.   Specific eye diseases such as chronic inflammation inside the eye or a sudden attack of a rare form of glaucoma.   Inherited or acquired medical problems.  SYMPTOMS   Gradual, progressive drop in vision in the affected eye.   Severe, rapid visual loss. This most often happens when trauma is the cause.  DIAGNOSIS  To detect a cataract, an eye doctor examines the lens.  Cataracts are best diagnosed with an exam of the eyes with the pupils enlarged (dilated) by drops.  TREATMENT  For an early cataract, vision may improve by using different eyeglasses or stronger lighting. If that does not help your vision, surgery is the only effective treatment. A cataract needs to be surgically removed when vision loss interferes with your everyday activities, such as driving, reading, or watching TV. A cataract may also have to be removed if it prevents examination or treatment of another eye problem. Surgery removes the cloudy lens and usually replaces it with a substitute lens (intraocular lens, IOL).  At a time when both you and your doctor agree, the cataract will be surgically removed. If you have cataracts in both eyes, only one is usually removed at a time. This allows the operated eye to heal and be out of danger from any possible problems after surgery (such as infection or poor wound healing). In rare cases, a cataract may be doing damage to your eye. In these cases, your caregiver may advise surgical removal right away. The vast majority of people who have cataract surgery have better vision afterward. HOME CARE INSTRUCTIONS  If you are not planning surgery, you may be asked to do the following:  Use different eyeglasses.   Use stronger or brighter lighting.   Ask your eye doctor about reducing your medicine dose or changing  medicines if it is thought that a medicine caused your cataract. Changing medicines does not make the cataract go away on its own.   Become familiar with your surroundings. Poor vision can lead to injury. Avoid bumping into things on the affected side. You are at a higher risk for tripping or falling.   Exercise extreme care when driving or operating machinery.   Wear sunglasses if you are sensitive to bright light or experiencing problems with glare.  SEEK IMMEDIATE MEDICAL CARE IF:   You have a worsening or sudden vision loss.   You notice  redness, swelling, or increasing pain in the eye.   You have a fever.  Document Released: 01/22/2005 Document Revised: 01/11/2011 Document Reviewed: 09/15/2010 [facility] Patient Information 2012 [place].PATIENT INSTRUCTIONS POST-ANESTHESIA  IMMEDIATELY FOLLOWING SURGERY:  Do not drive or operate machinery for the first twenty four hours after surgery.  Do not make any important decisions for twenty four hours after surgery or while taking narcotic pain medications or sedatives.  If you develop intractable nausea and vomiting or a severe headache please notify your doctor immediately.  FOLLOW-UP:  Please make an appointment with your surgeon as instructed. You do not need to follow up with anesthesia unless specifically instructed to do so.  WOUND CARE INSTRUCTIONS (if applicable):  Keep a dry clean dressing on the anesthesia/puncture wound site if there is drainage.  Once the wound has quit draining you may leave it open to air.  Generally you should leave the bandage intact for twenty four hours unless there is drainage.  If the epidural site drains for more than 36-48 hours please call the anesthesia department.  QUESTIONS?:  Please feel free to call your physician or the hospital operator if you have any questions, and they will be happy to assist you.

## 2012-04-29 ENCOUNTER — Encounter (HOSPITAL_COMMUNITY): Payer: Self-pay

## 2012-04-29 ENCOUNTER — Encounter (HOSPITAL_COMMUNITY)
Admission: RE | Admit: 2012-04-29 | Discharge: 2012-04-29 | Disposition: A | Discharge status: Home / Self Care | Payer: Medicare Other | Source: Ambulatory Visit | Attending: Ophthalmology | Admitting: Ophthalmology

## 2012-04-29 ENCOUNTER — Telehealth: Payer: Self-pay | Admitting: Family Medicine

## 2012-04-29 ENCOUNTER — Encounter (HOSPITAL_COMMUNITY): Payer: Self-pay | Admitting: Pharmacy Technician

## 2012-04-29 LAB — BASIC METABOLIC PANEL
BUN: 13 mg/dL (ref 6–23)
CO2: 30 mEq/L (ref 19–32)
Calcium: 10.1 mg/dL (ref 8.4–10.5)
GFR calc non Af Amer: 87 mL/min — ABNORMAL LOW (ref >90)
Glucose, Bld: 73 mg/dL (ref 70–99)
Potassium: 5.1 mEq/L (ref 3.5–5.1)

## 2012-04-29 MED ORDER — CYCLOPENTOLATE-PHENYLEPHRINE 0.2-1 % OP SOLN
OPHTHALMIC | Status: AC
  Filled 2012-04-29: qty 2

## 2012-04-29 NOTE — Telephone Encounter (Signed)
Tell pt I generally do not prescribe hot tub purchases. Will need ov to discuss.

## 2012-04-29 NOTE — Telephone Encounter (Signed)
Patient notified that Dr. Lubertha South generally do not prescribe hot tub purchases. Will need ov to discuss. Patient refused to come in for ov. She stated it would be pointless.

## 2012-04-29 NOTE — Telephone Encounter (Signed)
Patients states she needs a letter for [employer].  Ordered Hot Tub and was told that if she has a letter stating she needs this for Medical necessity then she gets discount.  Call Patient when letter is ready.

## 2012-05-05 ENCOUNTER — Encounter (HOSPITAL_COMMUNITY): Payer: Self-pay | Admitting: *Deleted

## 2012-05-05 ENCOUNTER — Ambulatory Visit (HOSPITAL_COMMUNITY)
Admission: RE | Admit: 2012-05-05 | Discharge: 2012-05-05 | Disposition: A | Discharge status: Home / Self Care | Payer: Medicare Other | Source: Ambulatory Visit | Attending: Ophthalmology | Admitting: Ophthalmology

## 2012-05-05 ENCOUNTER — Encounter (HOSPITAL_COMMUNITY)
Admission: RE | Disposition: A | Discharge status: Home / Self Care | Payer: Self-pay | Source: Ambulatory Visit | Attending: Ophthalmology

## 2012-05-05 ENCOUNTER — Encounter (HOSPITAL_COMMUNITY): Payer: Self-pay | Admitting: Anesthesiology

## 2012-05-05 ENCOUNTER — Ambulatory Visit (HOSPITAL_COMMUNITY): Payer: Medicare Other | Admitting: Anesthesiology

## 2012-05-05 DIAGNOSIS — Z01812 Encounter for preprocedural laboratory examination: Secondary | ICD-10-CM | POA: Insufficient documentation

## 2012-05-05 DIAGNOSIS — I10 Essential (primary) hypertension: Secondary | ICD-10-CM | POA: Insufficient documentation

## 2012-05-05 DIAGNOSIS — H2510 Age-related nuclear cataract, unspecified eye: Secondary | ICD-10-CM | POA: Insufficient documentation

## 2012-05-05 HISTORY — PX: CATARACT EXTRACTION W/PHACO: SHX586

## 2012-05-05 SURGERY — PHACOEMULSIFICATION, CATARACT, WITH IOL INSERTION
Anesthesia: Monitor Anesthesia Care | Site: Eye | Laterality: Left | Wound class: Clean

## 2012-05-05 MED ORDER — LIDOCAINE HCL 3.5 % OP GEL
OPHTHALMIC | Status: AC
  Filled 2012-05-05: qty 5

## 2012-05-05 MED ORDER — BSS IO SOLN
INTRAOCULAR | Status: DC | PRN
  Administered 2012-05-05: 15 mL via INTRAOCULAR

## 2012-05-05 MED ORDER — POVIDONE-IODINE 5 % OP SOLN
OPHTHALMIC | Status: DC | PRN
  Administered 2012-05-05: 1 via OPHTHALMIC

## 2012-05-05 MED ORDER — CYCLOPENTOLATE-PHENYLEPHRINE 0.2-1 % OP SOLN
1.0000 [drp] | Freq: Once | OPHTHALMIC | Status: AC
  Administered 2012-05-05: 1 [drp] via OPHTHALMIC

## 2012-05-05 MED ORDER — MIDAZOLAM HCL 2 MG/2ML IJ SOLN
INTRAMUSCULAR | Status: AC
  Filled 2012-05-05: qty 2

## 2012-05-05 MED ORDER — EPINEPHRINE HCL 1 MG/ML IJ SOLN
INTRAMUSCULAR | Status: AC
  Filled 2012-05-05: qty 1

## 2012-05-05 MED ORDER — EPINEPHRINE HCL 1 MG/ML IJ SOLN
INTRAMUSCULAR | Status: DC | PRN
  Administered 2012-05-05: 08:00:00

## 2012-05-05 MED ORDER — LIDOCAINE 3.5 % OP GEL OPTIME - NO CHARGE
OPHTHALMIC | Status: DC | PRN
  Administered 2012-05-05: 2 [drp] via OPHTHALMIC

## 2012-05-05 MED ORDER — LIDOCAINE HCL 3.5 % OP GEL: Freq: Once | OPHTHALMIC | Status: DC

## 2012-05-05 MED ORDER — TETRACAINE HCL 0.5 % OP SOLN
OPHTHALMIC | Status: AC
  Filled 2012-05-05: qty 2

## 2012-05-05 MED ORDER — MOXIFLOXACIN HCL 0.5 % OP SOLN - NO CHARGE
1.0000 [drp] | Freq: Once | OPHTHALMIC | Status: DC
  Filled 2012-05-05: qty 3

## 2012-05-05 MED ORDER — MIDAZOLAM HCL 5 MG/5ML IJ SOLN
INTRAMUSCULAR | Status: DC | PRN
  Administered 2012-05-05 (×2): 0.5 mg via INTRAVENOUS
  Administered 2012-05-05: 1 mg via INTRAVENOUS

## 2012-05-05 MED ORDER — TETRACAINE 0.5 % OP SOLN OPTIME - NO CHARGE
OPHTHALMIC | Status: DC | PRN
  Administered 2012-05-05: 2 [drp] via OPHTHALMIC

## 2012-05-05 MED ORDER — GATIFLOXACIN 0.5 % OP SOLN OPTIME - NO CHARGE
1.0000 [drp] | Freq: Once | OPHTHALMIC | Status: AC
  Administered 2012-05-05: 1 [drp] via OPHTHALMIC
  Filled 2012-05-05: qty 2.5

## 2012-05-05 MED ORDER — TETRACAINE HCL 0.5 % OP SOLN: 1.0000 [drp] | OPHTHALMIC | Status: DC

## 2012-05-05 MED ORDER — GATIFLOXACIN 0.5 % OP SOLN OPTIME - NO CHARGE
OPHTHALMIC | Status: DC | PRN
  Administered 2012-05-05: 1 [drp] via OPHTHALMIC

## 2012-05-05 MED ORDER — KETOROLAC TROMETHAMINE 0.4 % OP SOLN - NO CHARGE
1.0000 [drp] | Freq: Once | OPHTHALMIC | Status: AC
  Administered 2012-05-05: 1 [drp] via OPHTHALMIC
  Filled 2012-05-05: qty 5

## 2012-05-05 MED ORDER — NA HYALUR & NA CHOND-NA HYALUR 0.55-0.5 ML IO KIT
PACK | INTRAOCULAR | Status: DC | PRN
  Administered 2012-05-05: 1 via OPHTHALMIC

## 2012-05-05 MED ORDER — MIDAZOLAM HCL 2 MG/2ML IJ SOLN
1.0000 mg | INTRAMUSCULAR | Status: DC | PRN
  Administered 2012-05-05: 2 mg via INTRAVENOUS

## 2012-05-05 MED ORDER — LACTATED RINGERS IV SOLN
INTRAVENOUS | Status: DC
  Administered 2012-05-05: 1000 mL via INTRAVENOUS

## 2012-05-05 SURGICAL SUPPLY — 27 items
CAPSULAR TENSION RING-AMO (OPHTHALMIC RELATED) IMPLANT
CLOTH BEACON ORANGE TIMEOUT ST (SAFETY) ×1 IMPLANT
GLOVE BIO SURGEON STRL SZ7.5 (GLOVE) IMPLANT
GLOVE BIOGEL M 6.5 STRL (GLOVE) IMPLANT
GLOVE BIOGEL PI IND STRL 6.5 (GLOVE) IMPLANT
GLOVE BIOGEL PI IND STRL 7.0 (GLOVE) IMPLANT
GLOVE BIOGEL PI INDICATOR 6.5 (GLOVE) ×1
GLOVE BIOGEL PI INDICATOR 7.0 (GLOVE) ×1
GLOVE ECLIPSE 6.5 STRL STRAW (GLOVE) IMPLANT
GLOVE ECLIPSE 7.5 STRL STRAW (GLOVE) IMPLANT
GLOVE EXAM NITRILE LRG STRL (GLOVE) IMPLANT
GLOVE EXAM NITRILE MD LF STRL (GLOVE) IMPLANT
GLOVE SKINSENSE NS SZ6.5 (GLOVE)
GLOVE SKINSENSE NS SZ7.0 (GLOVE)
GLOVE SKINSENSE STRL SZ6.5 (GLOVE) IMPLANT
GLOVE SKINSENSE STRL SZ7.0 (GLOVE) IMPLANT
INST SET CATARACT ~~LOC~~ (KITS) ×2 IMPLANT
KIT VITRECTOMY (OPHTHALMIC RELATED) IMPLANT
PAD ARMBOARD 7.5X6 YLW CONV (MISCELLANEOUS) ×1 IMPLANT
PROC W NO LENS (INTRAOCULAR LENS)
PROC W SPEC LENS (INTRAOCULAR LENS)
PROCESS W NO LENS (INTRAOCULAR LENS) IMPLANT
PROCESS W SPEC LENS (INTRAOCULAR LENS) IMPLANT
RING MALYGIN (MISCELLANEOUS) IMPLANT
SIGHTPATH CAT PROC W REG LENS (Ophthalmic Related) ×2 IMPLANT
VISCOELASTIC ADDITIONAL (OPHTHALMIC RELATED) IMPLANT
WATER STERILE IRR 250ML POUR (IV SOLUTION) ×1 IMPLANT

## 2012-05-05 NOTE — H&P (Signed)
I have reviewed the pre printed H&P, the patient was re-examined, and I have identified no significant interval changes in the patient's medical condition.  There is no change in the plan of care since the history and physical of record. 

## 2012-05-05 NOTE — Anesthesia Preprocedure Evaluation (Signed)
Anesthesia Evaluation  Patient identified by MRN, date of birth, ID band Patient awake    Reviewed: Allergy & Precautions, H&P , NPO status , Patient's Chart, lab work & pertinent test results, reviewed documented beta blocker date and time   History of Anesthesia Complications Negative for: history of anesthetic complications  Airway Mallampati: I      Dental  (+) Teeth Intact   Pulmonary neg pulmonary ROS,  breath sounds clear to auscultation        Cardiovascular hypertension, Pt. on medications + dysrhythmias Supra Ventricular Tachycardia Rhythm:Regular Rate:Bradycardia     Neuro/Psych    GI/Hepatic negative GI ROS,   Endo/Other    Renal/GU      Musculoskeletal   Abdominal   Peds  Hematology   Anesthesia Other Findings   Reproductive/Obstetrics                           Anesthesia Physical Anesthesia Plan  ASA: II  Anesthesia Plan: MAC   Post-op Pain Management:    Induction: Intravenous  Airway Management Planned: Nasal Cannula  Additional Equipment:   Intra-op Plan:   Post-operative Plan:   Informed Consent: I have reviewed the patients History and Physical, chart, labs and discussed the procedure including the risks, benefits and alternatives for the proposed anesthesia with the patient or authorized representative who has indicated his/her understanding and acceptance.     Plan Discussed with:   Anesthesia Plan Comments:         Anesthesia Quick Evaluation

## 2012-05-05 NOTE — Brief Op Note (Signed)
05/05/2012  8:30 AM  PATIENT:  Elnora Morrison  72 y.o. female  PRE-OPERATIVE DIAGNOSIS:  nuclear cataract left eye  POST-OPERATIVE DIAGNOSIS:  nuclear cataract left eye  PROCEDURE:  Procedure(s): CATARACT EXTRACTION PHACO AND INTRAOCULAR LENS PLACEMENT (IOC)  SURGEON:  Surgeon(s): Susa Simmonds, MD  ASSISTANTS:  [facility] kENDRICK  CST  ANESTHESIA STAFF: Anesthesiologist: Laurene Footman, MD CRNA: Franco Nones, CRNA  ANESTHESIA:   topical and MAC  REQUESTED LENS POWER: 22.0  LENS IMPLANT INFORMATION:  Alcon SN60WF s/n [id number]  CUMULATIVE DISSIPATED ENERGY:12.78  INDICATIONS:see H&P indications for specifics  OP FINDINGS:dense NS  COMPLICATIONS:None  DICTATION #: see scanned note  PLAN OF CARE: KPE w iol TODAY  PATIENT DISPOSITION:  Short Stay

## 2012-05-05 NOTE — Transfer of Care (Signed)
Immediate Anesthesia Transfer of Care Note  Patient: [name]  Procedure(s) Performed: Procedure(s) (LRB): CATARACT EXTRACTION PHACO AND INTRAOCULAR LENS PLACEMENT (IOC) (Left)  Patient Location: Shortstay  Anesthesia Type: MAC  Level of Consciousness: awake  Airway & Oxygen Therapy: Patient Spontanous Breathing   Post-op Assessment: Report given to PACU RN, Post -op Vital signs reviewed and stable and Patient moving all extremities  Post vital signs: Reviewed and stable  Complications: No apparent anesthesia complications

## 2012-05-05 NOTE — Anesthesia Procedure Notes (Signed)
Procedure Name: MAC Date/Time: 05/05/2012 7:31 AM Performed by: Franco Nones Pre-anesthesia Checklist: Patient identified, Emergency Drugs available, Suction available, Timeout performed and Patient being monitored Patient Re-evaluated:Patient Re-evaluated prior to inductionOxygen Delivery Method: Nasal Cannula

## 2012-05-05 NOTE — Op Note (Signed)
SEE SCANNED NOTE DONE TOCAY

## 2012-05-05 NOTE — Anesthesia Postprocedure Evaluation (Signed)
  Anesthesia Post-op Note  Patient: [name]  Procedure(s) Performed: Procedure(s) (LRB): CATARACT EXTRACTION PHACO AND INTRAOCULAR LENS PLACEMENT (IOC) (Left)  Patient Location:  Short Stay  Anesthesia Type: MAC  Level of Consciousness: awake  Airway and Oxygen Therapy: Patient Spontanous Breathing  Post-op Pain: none  Post-op Assessment: Post-op Vital signs reviewed, Patient's Cardiovascular Status Stable, Respiratory Function Stable, Patent Airway, No signs of Nausea or vomiting and Pain level controlled  Post-op Vital Signs: Reviewed and stable  Complications: No apparent anesthesia complications

## 2012-05-06 ENCOUNTER — Encounter (HOSPITAL_COMMUNITY): Payer: Self-pay | Admitting: Ophthalmology

## 2012-07-15 ENCOUNTER — Encounter: Payer: Self-pay | Admitting: Family Medicine

## 2012-07-30 ENCOUNTER — Encounter: Payer: Self-pay | Admitting: *Deleted

## 2012-09-10 ENCOUNTER — Other Ambulatory Visit: Payer: Self-pay

## 2012-10-16 ENCOUNTER — Encounter: Payer: Self-pay | Admitting: Family Medicine

## 2012-10-16 ENCOUNTER — Ambulatory Visit (INDEPENDENT_AMBULATORY_CARE_PROVIDER_SITE_OTHER): Payer: Medicare Other | Admitting: Family Medicine

## 2012-10-16 VITALS — BP 152/82 | Temp 98.0°F | Ht 61.0 in | Wt 120.2 lb

## 2012-10-16 DIAGNOSIS — K5289 Other specified noninfective gastroenteritis and colitis: Secondary | ICD-10-CM

## 2012-10-16 DIAGNOSIS — M5430 Sciatica, unspecified side: Secondary | ICD-10-CM

## 2012-10-16 DIAGNOSIS — K529 Noninfective gastroenteritis and colitis, unspecified: Secondary | ICD-10-CM

## 2012-10-16 DIAGNOSIS — K2970 Gastritis, unspecified, without bleeding: Secondary | ICD-10-CM

## 2012-10-16 DIAGNOSIS — M5432 Sciatica, left side: Secondary | ICD-10-CM

## 2012-10-16 MED ORDER — PANTOPRAZOLE SODIUM 40 MG PO TBEC: 40.0000 mg | DELAYED_RELEASE_TABLET | Freq: Every day | ORAL | Status: DC

## 2012-10-16 MED ORDER — NORTRIPTYLINE HCL 25 MG PO CAPS: 25.0000 mg | ORAL_CAPSULE | Freq: Every day | ORAL | Status: DC

## 2012-10-16 NOTE — Progress Notes (Signed)
  Subjective:    Patient ID: [name], female    DOB: [birth year], 72 y.o.   MRN: [id number]  HPI Comments: Also complaining of lower back pain. This is a chronic condition. It is now radiating to her left shin.   Diarrhea  This is a new problem. The current episode started 1 to 4 weeks ago. The problem occurs 2 to 4 times per day. The problem has been gradually improving. Associated symptoms include bloating and increased flatus. She has tried nothing for the symptoms.   Diarrhea ten days agoon flax supplement, has helped considerably.now out of the ordinary.loose stool, small, some loose gas, no abd pain with it.  Not improving. Queries parasite.ques diverticulosis . Three to four. No one else with loose stools. Had a leg inj to glute and hamstring, bad injury, went right to the ortho, Took a long time to recover hip xray-neg  Left hip, back rad to leg and then on into shin Positive history sciatica in the past.  Review of Systems  Gastrointestinal: Positive for diarrhea, bloating and flatus.   no back injury no change in urinary habits.     Objective:   Physical Exam  Alert no acute distress. Lungs clear. Heart regular rate and rhythm. Left buttock some pain with deep palpation. Plus minus straight leg raise on the left. Abdomen soft good bowel sounds no left lower quadrant tenderness. Distinct epigastric tenderness.      Assessment & Plan:  Impression #1 gastritis discussed at length likely secondary to significant had refill use recently due to #2 #2 intermittent sciatica not enough to do MRI yet discussed at great length. #3 gastroenteritis 10 day duration. Highly doubt parasites right. Somewhat better not enough to pull trigger on full workup yet. Plan Protonix daily. Nortriptyline 25 each bedtime when necessary for neuropathic discomfort. Recheck in several weeks. WSL

## 2012-11-17 ENCOUNTER — Encounter: Payer: Self-pay | Admitting: Family Medicine

## 2012-11-17 ENCOUNTER — Ambulatory Visit (INDEPENDENT_AMBULATORY_CARE_PROVIDER_SITE_OTHER): Payer: Medicare Other | Admitting: Family Medicine

## 2012-11-17 VITALS — BP 130/90 | Ht 61.0 in | Wt 116.0 lb

## 2012-11-17 DIAGNOSIS — R109 Unspecified abdominal pain: Secondary | ICD-10-CM

## 2012-11-17 DIAGNOSIS — R21 Rash and other nonspecific skin eruption: Secondary | ICD-10-CM

## 2012-11-17 NOTE — Progress Notes (Signed)
  Subjective:    Patient ID: [name], female    DOB: [birth year], 72 y.o.   MRN: [id number]  HPI Patient is here today for f/u visit from 9/11. Her sciatica is better.  She is still having abnormal bowel movements. This has been going on for 6 weeks now.  Still having a lot of gi symptoms. Loose stools, since of tenesmus, Heartburn type symtoms,  Hx of reflux in the past,  A lot of gi symptoms  No foods no meds  Sciatica improved. Patient stopped the nortriptyline.  Patient has a spot on her left cheek. Couple weeks duration. Her prior dermatologist no longer accepts her insurance.,    Review of Systems 12 system review Otherwise negative.    Objective:   Physical Exam  Alert no apparent distress. HEENT normal. Lungs clear. Heart regular rate and rhythm. Abdomen diffuse tenderness no rebound no guarding no masses. No CVA tenderness. Straight leg raise negative. Left cheek concerning lesion which may represent superficial basal cell skin cancer discussed      Assessment & Plan:  Impression #1 persistent abdominal discomfort and loose stools. Last colonoscopy over 5 years ago. Minimal response to proton pump inhibitor. Time for GI consultation discussed. #2 skin lesion probable early skin cancer patient's dermatologist no longer accepting Medicare. At least not her product. Plan GI consult rationale discussed. Dermatology consult rationale discussed.

## 2012-11-18 ENCOUNTER — Telehealth: Payer: Self-pay | Admitting: Internal Medicine

## 2012-11-18 NOTE — Telephone Encounter (Signed)
Spoke with patient and she has had diarrhea since Labor Day. She has seen her PCP twice for this and is now referred to GI. She states she is on Protonix daily. The stool is loose with mucous. She also has bloating. Scheduled with Doug Sou, PA tomorrow at 1:30 pm.

## 2012-11-19 ENCOUNTER — Encounter: Payer: Self-pay | Admitting: *Deleted

## 2012-11-20 ENCOUNTER — Encounter: Payer: Self-pay | Admitting: Gastroenterology

## 2012-11-20 ENCOUNTER — Other Ambulatory Visit: Payer: Self-pay | Admitting: Family Medicine

## 2012-11-20 ENCOUNTER — Ambulatory Visit (INDEPENDENT_AMBULATORY_CARE_PROVIDER_SITE_OTHER): Payer: Medicare Other | Admitting: Gastroenterology

## 2012-11-20 VITALS — BP 172/98 | HR 72 | Ht 61.0 in | Wt 115.1 lb

## 2012-11-20 DIAGNOSIS — R194 Change in bowel habit: Secondary | ICD-10-CM

## 2012-11-20 DIAGNOSIS — R197 Diarrhea, unspecified: Secondary | ICD-10-CM

## 2012-11-20 DIAGNOSIS — R198 Other specified symptoms and signs involving the digestive system and abdomen: Secondary | ICD-10-CM

## 2012-11-20 MED ORDER — MOVIPREP 100 G PO SOLR: 1.0000 | Freq: Once | ORAL | Status: DC

## 2012-11-20 MED ORDER — RIFAXIMIN 550 MG PO TABS: 550.0000 mg | ORAL_TABLET | Freq: Two times a day (BID) | ORAL | Status: DC

## 2012-11-20 NOTE — Patient Instructions (Addendum)
You have been scheduled for a colonoscopy with propofol. Please follow written instructions given to you at your visit today.  Please pick up your prep kit at the pharmacy within the next 1-3 days. If you use inhalers (even only as needed), please bring them with you on the day of your procedure. Your physician has requested that you go to www.startemmi.com and enter the access code given to you at your visit today. This web site gives a general overview about your procedure. However, you should still follow specific instructions given to you by our office regarding your preparation for the procedure.  We have given you samples of the following medication to take: Xifaxan 550 mg-Take 1 tablet twice daily  We have sent the following medications to your pharmacy for you to pick up at your convenience: Xifaxan 550 mg-Take 1 tablet twice daily  CC: Dr Lina Sar, Dr Lubertha South

## 2012-11-21 ENCOUNTER — Encounter: Payer: Self-pay | Admitting: Gastroenterology

## 2012-11-21 ENCOUNTER — Encounter: Payer: Self-pay | Admitting: Internal Medicine

## 2012-11-21 DIAGNOSIS — R197 Diarrhea, unspecified: Secondary | ICD-10-CM | POA: Insufficient documentation

## 2012-11-21 DIAGNOSIS — R194 Change in bowel habit: Secondary | ICD-10-CM | POA: Insufficient documentation

## 2012-11-21 NOTE — Progress Notes (Signed)
11/21/2012 [name] [id number] 12-01-1940   HISTORY OF PRESENT ILLNESS:  Patient is a pleasant 72 year old female who is a patient of Dr. Regino Schultze.  She presents to our office today with complaints of change in bowels with diarrhea/loose stools. She says that all of her symptoms began Labor Day weekend when she had sudden onset of severe diarrhea. Despite this diarrhea she did not feel "sick ".  She was having several bowel movements a day, approximately 6-8 times per day. The severe diarrhea lasted a few days, however, she still continued with loose-watery stools and would pass some stool every time she would sit down to urinate. Her issues continued to improve slightly, but she is still having very loose stools that come out in pieces.  She also feels very bloated and has some diffuse abdominal discomfort. She reports that she has lost about 5 or 6 pounds.  She has not seen any blood in her stool.  She is very concerned about this because prior to its onset several weeks ago she had a very regular bowel regimen for quite some time.  She does take a daily probiotic. Her last colonoscopy was almost 5 years ago in December 2009. At that time she was found to have only diverticulosis in the sigmoid colon and it was recommended that she have a repeat procedure in 10 years.    Past Medical History  Diagnosis Date  . Dysrhythmia     palpatations  . Diverticulosis   . Arthritis   . HOH (hard of hearing)     bilateral hearing aids  . Hypertension   . DDD (degenerative disc disease)   . Tachyarrhythmia    Past Surgical History  Procedure Laterality Date  . Tonsillectomy    . Appendectomy    . Right knee arthroscopy    . Anterior and posterior repair    . Right acl    . Abdominal hysterectomy    . Cataract extraction w/phaco  06/25/2011    Procedure: CATARACT EXTRACTION PHACO AND INTRAOCULAR LENS PLACEMENT (IOC);  Surgeon: Susa Simmonds, MD;  Location: [facility];  Service: Ophthalmology;   Laterality: Right;  CDE: 8.90  . Excision morton's neuroma Right   . Cataract extraction w/phaco Left 05/05/2012    Procedure: CATARACT EXTRACTION PHACO AND INTRAOCULAR LENS PLACEMENT (IOC);  Surgeon: Susa Simmonds, MD;  Location: [facility];  Service: Ophthalmology;  Laterality: Left;  CDE 12.78  . Adenoidectomy    . Breast biopsy      reports that she has quit smoking. Her smoking use included Cigarettes. She has a 5 pack-year smoking history. She quit smokeless tobacco use about 51 years ago. She reports that she drinks alcohol. She reports that she does not use illicit drugs. family history includes CVA in her mother; Hypertension in her mother; Throat cancer in her father. There is no history of Pseudochol deficiency, Malignant hyperthermia, Hypotension, or Anesthesia problems. Allergies  Allergen Reactions  . Codeine     REACTION: syncope  . Hydrocodone Nausea Only    Dizzy  . Sulfonamide Derivatives Other (See Comments)    Unknown reaction  . Penicillins Rash      Outpatient Encounter Prescriptions as of 11/20/2012  Medication Sig Dispense Refill  . Calcium Carbonate (CALCIUM 600 PO) Take 600 mg by mouth daily.      Marland Kitchen estradiol (ESTRACE) 0.1 MG/GM vaginal cream Place 2 g vaginally once a week.      . Glucosamine-Chondroitin-Vit D3 1500-1200-800 MG-MG-UNIT  PACK Take 1 tablet by mouth daily.      Marland Kitchen ibuprofen (ADVIL,MOTRIN) 200 MG tablet Take 200 mg by mouth every 6 (six) hours as needed for pain.      . Multiple Vitamins-Minerals (CENTRUM SILVER) tablet Take 1 tablet by mouth daily.      . OMEGA 3 1000 MG CAPS Take 2 capsules by mouth at bedtime.      . Probiotic Product (PROBIOTIC DAILY PO) Take 1 tablet by mouth daily.      . [DISCONTINUED] metoprolol (LOPRESSOR) 50 MG tablet Take 25 mg by mouth 2 (two) times daily.      Marland Kitchen MOVIPREP 100 G SOLR Take 1 kit (200 g total) by mouth once.  1 kit  0  . rifaximin (XIFAXAN) 550 MG TABS tablet Take 1 tablet (550 mg total) by mouth 2 (two)  times daily.  12 tablet  0  . [DISCONTINUED] nortriptyline (PAMELOR) 25 MG capsule Take 1 capsule (25 mg total) by mouth at bedtime.  30 capsule  2  . [DISCONTINUED] pantoprazole (PROTONIX) 40 MG tablet Take 1 tablet (40 mg total) by mouth daily.  30 tablet  2  . [DISCONTINUED] rifaximin (XIFAXAN) 550 MG TABS tablet Take 1 tablet (550 mg total) by mouth 2 (two) times daily.  8 tablet  0   No facility-administered encounter medications on file as of 11/20/2012.     REVIEW OF SYSTEMS  : All other systems reviewed and negative except where noted in the History of Present Illness.   PHYSICAL EXAM: BP 172/98  Pulse 72  Ht 5\' 1"  (1.549 m)  Wt 115 lb 2 oz (52.22 kg)  BMI 21.76 kg/m2 General: Well developed white female in no acute distress Head: Normocephalic and atraumatic Eyes:  Sclerae anicteric, conjunctiva pink. Ears: Normal auditory acuity Lungs: Clear throughout to auscultation Heart: Regular rate and rhythm Abdomen: Soft, non-distended.  BS present.  Mild diffuse TTP without R/R/G. Rectal: Deferred.  Will be done at the time of colonoscopy. Musculoskeletal: Symmetrical with no gross deformities  Skin: No lesions on visible extremities Extremities: No edema  Neurological: Alert oriented x 4, grossly nonfocal Cervical Nodes:  No significant cervical adenopathy Psychological:  Alert and cooperative. Normal mood and affect  ASSESSMENT AND PLAN: -Change in bowels, initially acute onset of diarrhea and now persistent loose stools.  I think this may be something as benign as a gastroenteritis with post-infectious IBS, but the patient is very concerned.  Last colonoscopy was almost 5 years ago and she is requesting a repeat procedure for piece of mind and reassurance.  In the interim I am going to have her take Xifaxan 500 mg BID for ten days as well as to continue her daily probiotic.  If Xifaxan is not covered by her insurance then we could give empirically give a course of Flagyl 250 mg  TID for 10 days.  The risks, benefits, and alternatives were discussed with the patient and she consents to proceed.

## 2012-11-21 NOTE — Progress Notes (Signed)
Reviewed and agree with disposition.

## 2012-11-26 ENCOUNTER — Encounter: Payer: Self-pay | Admitting: Internal Medicine

## 2012-11-26 ENCOUNTER — Ambulatory Visit (AMBULATORY_SURGERY_CENTER): Payer: Medicare Other | Admitting: Internal Medicine

## 2012-11-26 ENCOUNTER — Encounter: Payer: Self-pay | Admitting: *Deleted

## 2012-11-26 VITALS — BP 161/92 | HR 55 | Temp 97.3°F | Resp 19 | Ht 61.0 in | Wt 115.0 lb

## 2012-11-26 DIAGNOSIS — R198 Other specified symptoms and signs involving the digestive system and abdomen: Secondary | ICD-10-CM

## 2012-11-26 DIAGNOSIS — D126 Benign neoplasm of colon, unspecified: Secondary | ICD-10-CM

## 2012-11-26 DIAGNOSIS — R197 Diarrhea, unspecified: Secondary | ICD-10-CM

## 2012-11-26 MED ORDER — SODIUM CHLORIDE 0.9 % IV SOLN: 500.0000 mL | INTRAVENOUS | Status: DC

## 2012-11-26 MED ORDER — DICYCLOMINE HCL 10 MG PO CAPS: 10.0000 mg | ORAL_CAPSULE | Freq: Two times a day (BID) | ORAL | Status: DC

## 2012-11-26 NOTE — Patient Instructions (Signed)
Discharge instructions given with verbal understanding. Handouts on polyps,diverticulosis and hemorrhoids. Resume previous medications. YOU HAD AN ENDOSCOPIC PROCEDURE TODAY AT THE [facility]: Refer to the procedure report that was given to you for any specific questions about what was found during the examination.  If the procedure report does not answer your questions, please call your gastroenterologist to clarify.  If you requested that your care partner not be given the details of your procedure findings, then the procedure report has been included in a sealed envelope for you to review at your convenience later.  YOU SHOULD EXPECT: Some feelings of bloating in the abdomen. Passage of more gas than usual.  Walking can help get rid of the air that was put into your GI tract during the procedure and reduce the bloating. If you had a lower endoscopy (such as a colonoscopy or flexible sigmoidoscopy) you may notice spotting of blood in your stool or on the toilet paper. If you underwent a bowel prep for your procedure, then you may not have a normal bowel movement for a few days.  DIET: Your first meal following the procedure should be a light meal and then it is ok to progress to your normal diet.  A half-sandwich or bowl of soup is an example of a good first meal.  Heavy or fried foods are harder to digest and may make you feel nauseous or bloated.  Likewise meals heavy in dairy and vegetables can cause extra gas to form and this can also increase the bloating.  Drink plenty of fluids but you should avoid alcoholic beverages for 24 hours.  ACTIVITY: Your care partner should take you home directly after the procedure.  You should plan to take it easy, moving slowly for the rest of the day.  You can resume normal activity the day after the procedure however you should NOT DRIVE or use heavy machinery for 24 hours (because of the sedation medicines used during the test).    SYMPTOMS TO REPORT  IMMEDIATELY: A gastroenterologist can be reached at any hour.  During normal business hours, 8:30 AM to 5:00 PM Monday through Friday, call [phone].  After hours and on weekends, please call the GI answering service at [phone] who will take a message and have the physician on call contact you.   Following lower endoscopy (colonoscopy or flexible sigmoidoscopy):  Excessive amounts of blood in the stool  Significant tenderness or worsening of abdominal pains  Swelling of the abdomen that is new, acute  Fever of 100F or higher  FOLLOW UP: If any biopsies were taken you will be contacted by phone or by letter within the next 1-3 weeks.  Call your gastroenterologist if you have not heard about the biopsies in 3 weeks.  Our staff will call the home number listed on your records the next business day following your procedure to check on you and address any questions or concerns that you may have at that time regarding the information given to you following your procedure. This is a courtesy call and so if there is no answer at the home number and we have not heard from you through the emergency physician on call, we will assume that you have returned to your regular daily activities without incident.  SIGNATURES/CONFIDENTIALITY: You and/or your care partner have signed paperwork which will be entered into your electronic medical record.  These signatures attest to the fact that that the information above on your After Visit Summary   has been reviewed and is understood.  Full responsibility of the confidentiality of this discharge information lies with you and/or your care-partner. 

## 2012-11-26 NOTE — Progress Notes (Signed)
Called to room to assist during endoscopic procedure.  Patient ID and intended procedure confirmed with present staff. Received instructions for my participation in the procedure from the performing physician.  

## 2012-11-26 NOTE — Op Note (Signed)
[facility] [address] [place], [address]   COLONOSCOPY PROCEDURE REPORT  PATIENT: [name], [name]  MR#: [id number] BIRTHDATE: Oct 27, 1940 , 72  yrs. old GENDER: Female ENDOSCOPIST: Hart Carwin, MD REFERRED BY:  Lilyan Punt, M.D. PROCEDURE DATE:  11/26/2012 PROCEDURE:   Colonoscopy with cold biopsy polypectomy ASA CLASS:   Class II INDICATIONS:Bloating, Change in bowel habits, Unexplained diarrhea, and colon 2009- diverticuli, had acute gastro-enteritis 2 months ago, still having abnormal B.M's. MEDICATIONS: MAC sedation, administered by CRNA and propofol (Diprivan) 250mg  IV  DESCRIPTION OF PROCEDURE:   After the risks and benefits and of the procedure were explained, informed consent was obtained.  A digital rectal exam revealed no abnormalities of the rectum.    The [id number] 1528 endoscope was introduced through the anus and advanced to the cecum, which was identified by both the appendix and ileocecal valve .  The quality of the prep was good, using MoviPrep .  The instrument was then slowly withdrawn as the colon was fully examined.     COLON FINDINGS: A diminutive smooth flat polyp was found in the sigmoid colon.  A polypectomy was performed with cold forceps.  The resection was complete and the polyp tissue was completely retrieved.   Mild diverticulosis was noted in the sigmoid colon. Small internal hemorrhoids were found. Random biopsies of the colon taken    Retroflexed views revealed no abnormalities.     The scope was then withdrawn from the patient and the procedure completed.  COMPLICATIONS: There were no complications. ENDOSCOPIC IMPRESSION: 1.   Diminutive flat polyp was found in the sigmoid colon; polypectomy was performed with cold forceps 2.   Mild diverticulosis was noted in the sigmoid colon 3.   Small internal hemorrhoids  RECOMMENDATIONS: Await pathology results Bentyl 10 mg po bid Metamucil 1 tsp daily OV 4 weeks Recall  colon 10 years  REPEAT EXAM:  cc:  _______________________________ eSignedHart Carwin, MD 11/26/2012 10:27 AM     PATIENT NAME:  [name], [name] MR#: [id number]

## 2012-11-27 ENCOUNTER — Telehealth: Payer: Self-pay

## 2012-11-27 NOTE — Telephone Encounter (Signed)
  Follow up Call-  Call back number 11/26/2012  Post procedure Call Back phone  # [phone]  Permission to leave phone message Yes     Patient questions:  Do you have a fever, pain , or abdominal swelling? no Pain Score  0 *  Have you tolerated food without any problems? yes  Have you been able to return to your normal activities? yes  Do you have any questions about your discharge instructions: Diet   no Medications  no Follow up visit  no  Do you have questions or concerns about your Care? no  Actions: * If pain score is 4 or above: No action needed, pain <4.

## 2012-12-01 ENCOUNTER — Encounter: Payer: Self-pay | Admitting: Internal Medicine

## 2012-12-11 ENCOUNTER — Other Ambulatory Visit: Payer: Self-pay

## 2012-12-24 ENCOUNTER — Ambulatory Visit (INDEPENDENT_AMBULATORY_CARE_PROVIDER_SITE_OTHER): Payer: Medicare Other

## 2012-12-24 DIAGNOSIS — Z23 Encounter for immunization: Secondary | ICD-10-CM

## 2012-12-30 ENCOUNTER — Ambulatory Visit: Payer: Medicare Other | Admitting: Internal Medicine

## 2013-01-13 ENCOUNTER — Encounter: Payer: Self-pay | Admitting: Family Medicine

## 2013-01-13 ENCOUNTER — Ambulatory Visit (INDEPENDENT_AMBULATORY_CARE_PROVIDER_SITE_OTHER): Payer: Medicare Other | Admitting: Family Medicine

## 2013-01-13 VITALS — BP 130/80 | Temp 97.8°F | Ht 61.0 in | Wt 116.5 lb

## 2013-01-13 DIAGNOSIS — N390 Urinary tract infection, site not specified: Secondary | ICD-10-CM

## 2013-01-13 LAB — POCT URINALYSIS DIPSTICK
Blood, UA: 500
Spec Grav, UA: <=1.005

## 2013-01-13 MED ORDER — CIPROFLOXACIN HCL 250 MG PO TABS: 250.0000 mg | ORAL_TABLET | Freq: Two times a day (BID) | ORAL | Status: DC

## 2013-01-13 NOTE — Progress Notes (Signed)
   Subjective:    Patient ID: [name], female    DOB: 11-22-1940, 71 y.o.   MRN: [id number]  Urinary Tract Infection  This is a new problem. The current episode started today. The problem occurs every urination. The problem has been unchanged. The quality of the pain is described as burning. The pain is moderate. There has been no fever. Associated symptoms include frequency and urgency. She has tried nothing for the symptoms. The treatment provided no relief.   Dysuria and discomfort   Using acid tabs and cranberry dose daily, increased urination  No vom no fever no chills  Used azo, , no hx of pyelonephritis,   Review of Systems  Genitourinary: Positive for urgency and frequency.   no chest pain no back pain no fever or chills ROS otherwise negative     Objective:   Physical Exam  Alert no apparent distress. HEENT normal. Lungs clear heart regular rate and rhythm. No CVA tenderness mild suprapubic tenderness  Urine 1520 white blood cells per high-power field      Assessment & Plan:  Impression 1 urinary tract infection many questions answered. Easily 15 minutes spent most in discussion UTIs 10 to occur during. Soon after intercourse. Patient urinates before having sex. Plan Cipro 250 twice a day 5 days. One refill to use parameters discussed. WSL

## 2013-01-20 ENCOUNTER — Encounter: Payer: Self-pay | Admitting: Internal Medicine

## 2013-01-20 ENCOUNTER — Ambulatory Visit (INDEPENDENT_AMBULATORY_CARE_PROVIDER_SITE_OTHER): Payer: Medicare Other | Admitting: Internal Medicine

## 2013-01-20 VITALS — BP 146/86 | HR 64 | Ht 60.5 in | Wt 113.0 lb

## 2013-01-20 DIAGNOSIS — K589 Irritable bowel syndrome without diarrhea: Secondary | ICD-10-CM

## 2013-01-20 NOTE — Progress Notes (Signed)
[name] 10-Mar-1940 [id number]   History of Present Illness:  This is a 72 year old white female who had a colonoscopy in October 2014 for colorectal screening and was found to have mild diverticulosis and a small polyp which came back as normal mucosa. She initially presented with diarrhea. She was started on Metamucil 1 heaping teaspoon daily and Bentyl 10 mg twice a day. She stopped taking Bentyl after about 2 weeks. She has continued on Metamucil which she increased to 2 teaspoons a day but has recently cut back down to 1 teaspoon daily. She was somewhat unhappy with me about not being able to discuss IBS with me after the procedure. She wanted to make appointments to see me but had to wait almost 2 months. In the meantime, she went online and obtained information on IBS. She is doing very well. She is having regular bowel movements, no pain or mucus. There is no family history of colon cancer. She will not need a recall colonoscopy due to age.  Past Medical History  Diagnosis Date  . Dysrhythmia     palpatations  . Diverticulosis   . Arthritis   . HOH (hard of hearing)     bilateral hearing aids  . Hypertension   . DDD (degenerative disc disease)   . Tachyarrhythmia     Past Surgical History  Procedure Laterality Date  . Tonsillectomy    . Appendectomy    . Right knee arthroscopy    . Anterior and posterior repair    . Right acl    . Abdominal hysterectomy    . Cataract extraction w/phaco  06/25/2011    Procedure: CATARACT EXTRACTION PHACO AND INTRAOCULAR LENS PLACEMENT (IOC);  Surgeon: Susa Simmonds, MD;  Location: [facility];  Service: Ophthalmology;  Laterality: Right;  CDE: 8.90  . Excision morton's neuroma Right   . Cataract extraction w/phaco Left 05/05/2012    Procedure: CATARACT EXTRACTION PHACO AND INTRAOCULAR LENS PLACEMENT (IOC);  Surgeon: Susa Simmonds, MD;  Location: [facility];  Service: Ophthalmology;  Laterality: Left;  CDE 12.78  . Adenoidectomy    . Breast  biopsy      Allergies  Allergen Reactions  . Codeine     REACTION: syncope  . Hydrocodone Nausea Only    Dizzy  . Sulfonamide Derivatives Other (See Comments)    Unknown reaction  . Penicillins Rash    Review of Systems: Currently all systems negative  The remainder of the 10 point ROS is negative except as outlined in the H&P  Physical Exam: General Appearance Well developed, in no distress Psychological Normal mood and affect  Assessment and Plan:   Problem #61 72 year old white female with irritable bowel syndrome which is currently under good control. We had a long discussion today and answered all patient's questions concerning IBS, high-fiber diet and antispasmodics. I have given her a pamphlet on gas producing foods as well as high-fiber diet and irritable bowel syndrome. She does not have to be followed regularly. I will see her on an as necessary basis.    Lina Sar 01/20/2013

## 2013-01-20 NOTE — Patient Instructions (Signed)
Dr Romero Belling

## 2013-01-22 ENCOUNTER — Encounter: Payer: Self-pay | Admitting: Family Medicine

## 2013-01-22 ENCOUNTER — Ambulatory Visit (INDEPENDENT_AMBULATORY_CARE_PROVIDER_SITE_OTHER): Payer: Medicare Other | Admitting: Family Medicine

## 2013-01-22 VITALS — BP 132/88 | HR 70 | Ht 61.0 in | Wt 111.6 lb

## 2013-01-22 DIAGNOSIS — Z79899 Other long term (current) drug therapy: Secondary | ICD-10-CM

## 2013-01-22 DIAGNOSIS — E782 Mixed hyperlipidemia: Secondary | ICD-10-CM

## 2013-01-22 NOTE — Progress Notes (Signed)
   Subjective:    Patient ID: [name], female    DOB: May 27, 1940, 72 y.o.   MRN: [id number]  [facility]. Has not had any falls in the last 6 months. Mini cog pass. Wants to get results of bone density test. Still having sciatica pain in left leg. Has had flu vaccine, pneumonia, and shingles vaccine.   Results for orders placed in visit on 01/13/13  POCT URINALYSIS DIPSTICK      Result Value Range   Color, UA       Clarity, UA       Glucose, UA       Bilirubin, UA       Ketones, UA       Spec Grav, UA <=1.005     Blood, UA 500     pH, UA 7.0     Protein, UA       Urobilinogen, UA       Nitrite, UA       Leukocytes, UA 4+      Calcium supp plus,   Exercising staying active, now regularly  Last mammo this summer and normal    Feels down at times, but now active Review of Systems  Constitutional: Negative for activity change, appetite change and fatigue.  HENT: Negative for congestion, ear discharge and rhinorrhea.   Eyes: Negative for discharge.  Respiratory: Negative for cough, chest tightness and wheezing.   Cardiovascular: Negative for chest pain.  Gastrointestinal: Negative for vomiting and abdominal pain.  Genitourinary: Negative for frequency and difficulty urinating.  Musculoskeletal: Negative for neck pain.  Allergic/Immunologic: Negative for environmental allergies and food allergies.  Neurological: Negative for weakness and headaches.  Psychiatric/Behavioral: Negative for behavioral problems and agitation.       Objective:   Physical Exam  Constitutional: She is oriented to person, place, and time. She appears well-developed and well-nourished.  HENT:  Head: Normocephalic.  Right Ear: External ear normal.  Left Ear: External ear normal.  Eyes: Pupils are equal, round, and reactive to light.  Neck: Normal range of motion. No thyromegaly present.  Cardiovascular: Normal rate, regular rhythm, normal heart sounds and intact distal pulses.     No murmur heard. Pulmonary/Chest: Effort normal and breath sounds normal. No respiratory distress. She has no wheezes.  Abdominal: Soft. Bowel sounds are normal. She exhibits no distension and no mass. There is no tenderness.  Genitourinary: Vagina normal and uterus normal.  Musculoskeletal: Normal range of motion. She exhibits no edema and no tenderness.  Lymphadenopathy:    She has no cervical adenopathy.  Neurological: She is alert and oriented to person, place, and time. She exhibits normal muscle tone.  Skin: Skin is warm and dry.  Psychiatric: She has a normal mood and affect. Her behavior is normal.          Assessment & Plan:  #1 wellness exam. #2 irritable bowel syndrome sees a specialist for this. #3 chronic sciatica ongoing concern #4 bone density test mild osteopenia and stable plan diet exercise discussed maintain yearly mammograms no need for colonoscopy at vaccines discussed. Patient to schedule appointment at one point for chronic sciatica. WSL

## 2013-01-23 ENCOUNTER — Other Ambulatory Visit: Payer: Self-pay | Admitting: Family Medicine

## 2013-01-23 LAB — BASIC METABOLIC PANEL
BUN: 11 mg/dL (ref 6–23)
CO2: 26 mEq/L (ref 19–32)
Chloride: 98 mEq/L (ref 96–112)
Creat: 0.64 mg/dL (ref 0.50–1.10)
Glucose, Bld: 86 mg/dL (ref 70–99)
Potassium: 4.5 mEq/L (ref 3.5–5.3)

## 2013-01-23 LAB — HEPATIC FUNCTION PANEL
ALT: 20 U/L (ref 0–35)
AST: 18 U/L (ref 0–37)
Albumin: 4.6 g/dL (ref 3.5–5.2)
Bilirubin, Direct: 0.1 mg/dL (ref 0.0–0.3)
Total Protein: 6.8 g/dL (ref 6.0–8.3)

## 2013-01-23 LAB — LIPID PANEL
Cholesterol: 185 mg/dL (ref 0–200)
Triglycerides: 63 mg/dL (ref <150)
VLDL: 13 mg/dL (ref 0–40)

## 2013-02-01 ENCOUNTER — Encounter: Payer: Self-pay | Admitting: Family Medicine

## 2013-02-10 HISTORY — PX: MOHS SURGERY: SUR867

## 2013-05-11 ENCOUNTER — Other Ambulatory Visit: Payer: Self-pay | Admitting: Internal Medicine

## 2013-06-01 ENCOUNTER — Ambulatory Visit (INDEPENDENT_AMBULATORY_CARE_PROVIDER_SITE_OTHER): Payer: Medicare Other | Admitting: Family Medicine

## 2013-06-01 ENCOUNTER — Encounter: Payer: Self-pay | Admitting: Family Medicine

## 2013-06-01 VITALS — BP 120/82 | Ht 61.0 in | Wt 114.2 lb

## 2013-06-01 DIAGNOSIS — M545 Low back pain, unspecified: Secondary | ICD-10-CM

## 2013-06-01 DIAGNOSIS — G8929 Other chronic pain: Secondary | ICD-10-CM | POA: Insufficient documentation

## 2013-06-01 DIAGNOSIS — M549 Dorsalgia, unspecified: Secondary | ICD-10-CM

## 2013-06-01 NOTE — Progress Notes (Signed)
   Subjective:    Patient ID: [name], female    DOB: 1940/09/22, 73 y.o.   MRN: [id number]  Back Pain This is a chronic problem. The current episode started more than 1 year ago. The problem occurs constantly. The problem has been gradually worsening since onset. The pain is present in the lumbar spine. The pain radiates to the left thigh. The pain is at a severity of 5/10. The pain is moderate. The pain is the same all the time. Stiffness is present all day. She has tried NSAIDs and home exercises (Tens Unit) for the symptoms. The treatment provided moderate relief.   Patient states she has no other concerns at this time.   Tried tens unit and it seems to help  Pain is ongoing and comes and goes  Uses advil up to sic per day  Hx of not the best lifting, did a lot of volunter ems work  2005 mri degen changes and bulging discs.  Pain comes and goes. Currently not very bad.    Review of Systems  Musculoskeletal: Positive for back pain.  no chest pain no abdominal pain no change in bowel habits     Objective:   Physical Exam Alert no apparent distress. Lungs clear. Heart regular in rhythm. Lumbar region no obvious tenderness. No sciatic notch tenderness. Negative straight leg raise.       Assessment & Plan:  Impression intermittent chronic back pain with sciatica features. Plan PT consult for exercise education. WSL

## 2013-06-09 ENCOUNTER — Ambulatory Visit (HOSPITAL_COMMUNITY)
Admission: RE | Admit: 2013-06-09 | Discharge: 2013-06-09 | Disposition: A | Discharge status: Home / Self Care | Payer: Medicare Other | Source: Ambulatory Visit | Attending: Family Medicine | Admitting: Family Medicine

## 2013-06-09 DIAGNOSIS — M6281 Muscle weakness (generalized): Secondary | ICD-10-CM | POA: Diagnosis not present

## 2013-06-09 DIAGNOSIS — M25659 Stiffness of unspecified hip, not elsewhere classified: Secondary | ICD-10-CM | POA: Insufficient documentation

## 2013-06-09 DIAGNOSIS — M545 Low back pain, unspecified: Secondary | ICD-10-CM | POA: Diagnosis not present

## 2013-06-09 DIAGNOSIS — G8929 Other chronic pain: Secondary | ICD-10-CM | POA: Insufficient documentation

## 2013-06-09 DIAGNOSIS — G5700 Lesion of sciatic nerve, unspecified lower limb: Secondary | ICD-10-CM | POA: Diagnosis not present

## 2013-06-09 DIAGNOSIS — IMO0001 Reserved for inherently not codable concepts without codable children: Secondary | ICD-10-CM | POA: Insufficient documentation

## 2013-06-09 DIAGNOSIS — I10 Essential (primary) hypertension: Secondary | ICD-10-CM | POA: Diagnosis not present

## 2013-06-09 NOTE — Evaluation (Addendum)
Physical Therapy Evaluation  Patient Details  Name: KYRIANNA BARLETTA MRN: [id number] Date of Birth: 12/07/1940  Today's Date: 06/09/2013 Time: 1345-1430 PT Time Calculation (min): 45 min    Charges: 1 evaluation, therEx 1412-1430          Visit#: 1 of 1  Re-eval:   Assessment Diagnosis: Piriformis syndrome:  difficulty sitting due to limited hip strength and mobility Prior Therapy: no  Authorization: [employer]    Authorization Visit#: 1 of 1   Past Medical History:  Past Medical History  Diagnosis Date  . Dysrhythmia     palpatations  . Diverticulosis   . Arthritis   . HOH (hard of hearing)     bilateral hearing aids  . Hypertension   . DDD (degenerative disc disease)   . Tachyarrhythmia    Past Surgical History:  Past Surgical History  Procedure Laterality Date  . Tonsillectomy    . Appendectomy    . Right knee arthroscopy    . Anterior and posterior repair    . Right acl    . Abdominal hysterectomy    . Cataract extraction w/phaco  06/25/2011    Procedure: CATARACT EXTRACTION PHACO AND INTRAOCULAR LENS PLACEMENT (IOC);  Surgeon: Williams Che, MD;  Location: [facility];  Service: Ophthalmology;  Laterality: Right;  CDE: 8.90  . Excision morton's neuroma Right   . Cataract extraction w/phaco Left 05/05/2012    Procedure: CATARACT EXTRACTION PHACO AND INTRAOCULAR LENS PLACEMENT (IOC);  Surgeon: Williams Che, MD;  Location: [facility];  Service: Ophthalmology;  Laterality: Left;  CDE 12.78  . Adenoidectomy    . Breast biopsy      Subjective Symptoms/Limitations Symptoms: pain radiates from low back down leg and into calf Pertinent History: Complaint of Lt low back pain that radiates How long can you sit comfortably?: 42min befor needing to change positions How long can you stand comfortably?: no difficulty How long can you walk comfortably?: no diffficulty Repetition: Increases Symptoms Pain Assessment Currently in Pain?: Yes Pain Location: Back Pain Orientation:  Left Pain Type: Chronic pain Pain Radiating Towards: Lt foot Pain Onset: More than a month ago Pain Frequency: Constant Pain Relieving Factors: stretching, and exercise, resting.  Multiple Pain Sites: No  Assessment LLE AROM (degrees) Overall AROM Left Lower Extremity: Within functional limits for tasks assessed LLE Strength Left Hip Flexion: 4/5 Left Hip Extension: 3+/5 Left Hip ABduction: 3+/5 Left Knee Flexion: 4/5 Left Knee Extension: 5/5  Exercise/Treatments Piriformis stretch 3way 10x 3sec each Anterior lunge 10x Lateral lunge 10x  Physical Therapy Assessment and Plan PT Assessment and Plan Clinical Impression Statement: Patient displays signs and symptoms consistent with piriformis syndrome includign limited piriformis muscle mobility and tenderness, as well as decreased glut medius and maximus strength. Patient demonstrated significant pain releif following stretching exercise s and was educated in performance of strengthenign exercises to improve hip stability and decrease strain on piriformis as patinet will only attend therapy this oone visit due to travel and busy schedule. .     Goals    Problem List Patient Active Problem List   Diagnosis Date Noted  . Chronic back pain 06/01/2013  . Change in bowel habits 11/21/2012  . Diarrhea 11/21/2012    PT - End of Session Activity Tolerance: Patient tolerated treatment well General Behavior During Therapy: WFL for tasks assessed/performed  GP   Functional Assessment Tool Used: clinical judgement Functional Limitation: Mobility: Walking and moving around Mobility: Walking and Moving Around Current Status (W0981): At least  60 percent but less than 80 percent impaired, limited or restricted Mobility: Walking and Moving Around Goal Status [phone]): At least 60 percent but less than 80 percent impaired, limited or restricted   Ijeoma Loor R Shyrl Obi 06/09/2013, 2:32 PM  Physician Documentation Your signature is required to  indicate approval of the treatment plan as stated above.  Please sign and either send electronically or make a copy of this report for your files and return this physician signed original.   Please mark one 1.__approve of plan  2. ___approve of plan with the following conditions.   ______________________________                                                          _____________________ Physician Signature                                                                                                             Date

## 2013-06-30 NOTE — Addendum Note (Signed)
Encounter addended by: Debby Bud, OT on: 06/30/2013 11:54 AM<BR>     Documentation filed: Episodes

## 2013-07-09 ENCOUNTER — Ambulatory Visit (INDEPENDENT_AMBULATORY_CARE_PROVIDER_SITE_OTHER): Payer: Medicare Other | Admitting: Nurse Practitioner

## 2013-07-09 VITALS — BP 112/68 | Temp 98.3°F | Ht 61.0 in | Wt 116.0 lb

## 2013-07-09 DIAGNOSIS — T148 Other injury of unspecified body region: Secondary | ICD-10-CM

## 2013-07-09 DIAGNOSIS — W57XXXA Bitten or stung by nonvenomous insect and other nonvenomous arthropods, initial encounter: Secondary | ICD-10-CM

## 2013-07-14 ENCOUNTER — Encounter: Payer: Self-pay | Admitting: Nurse Practitioner

## 2013-07-14 NOTE — Progress Notes (Signed)
Subjective:  Presents to discuss 2 tick bites that she has had within the past 2 weeks. Has pulled 1 off of her back area and 1 on the hip area. Had a faint rash around the 1 on her back area which has improved. Both areas are mildly pruritic. No fever. No headache. No joint pain. No other rash. States she had [facility] spotted fever 2 years ago. Keep [place] a tick calendar.  Objective:   BP 112/68  Temp(Src) 98.3 F (36.8 C) (Oral)  Ht 5\' 1"  (1.549 m)  Wt 116 lb (52.617 kg)  BMI 21.93 kg/m2 NAD. Alert, oriented. Lungs clear. Heart regular rate rhythm. 1 small slightly raised pink lesion noted on the back and the other one near the buttock. No other rash noted.  Assessment: Tick bites   Plan: Topical hydrocortisone cream as directed for itching. Daily antihistamines as directed. Lengthy discussion regarding tick disease. Patient to call back if any of the symptoms develop. Continue to keep a tick calendar.

## 2013-08-18 ENCOUNTER — Encounter: Payer: Self-pay | Admitting: Family Medicine

## 2013-10-01 ENCOUNTER — Other Ambulatory Visit: Payer: Self-pay | Admitting: Family Medicine

## 2013-10-15 NOTE — Addendum Note (Signed)
Encounter addended by: Leia Alf, PT on: 10/15/2013  5:51 PM<BR>     Documentation filed: Clinical Notes

## 2013-11-20 ENCOUNTER — Encounter: Payer: Self-pay | Admitting: Internal Medicine

## 2013-12-15 ENCOUNTER — Ambulatory Visit: Payer: Medicare Other | Admitting: *Deleted

## 2013-12-15 ENCOUNTER — Ambulatory Visit (INDEPENDENT_AMBULATORY_CARE_PROVIDER_SITE_OTHER): Payer: Medicare Other | Admitting: *Deleted

## 2013-12-15 DIAGNOSIS — Z23 Encounter for immunization: Secondary | ICD-10-CM

## 2013-12-30 ENCOUNTER — Telehealth: Payer: Self-pay | Admitting: Family Medicine

## 2013-12-30 MED ORDER — METOPROLOL TARTRATE 50 MG PO TABS: ORAL_TABLET | ORAL | Status: DC

## 2013-12-30 NOTE — Telephone Encounter (Signed)
Pt is in [place] and forgot her  metoprolol (LOPRESSOR) 50 MG tablet at home   She wants [place] to send 5 pills to [facility] there Please call it into [phone]

## 2013-12-30 NOTE — Telephone Encounter (Signed)
Patient notified

## 2014-02-12 DIAGNOSIS — L821 Other seborrheic keratosis: Secondary | ICD-10-CM | POA: Diagnosis not present

## 2014-02-12 DIAGNOSIS — Z85828 Personal history of other malignant neoplasm of skin: Secondary | ICD-10-CM | POA: Diagnosis not present

## 2014-02-12 DIAGNOSIS — B079 Viral wart, unspecified: Secondary | ICD-10-CM | POA: Diagnosis not present

## 2014-02-15 ENCOUNTER — Encounter: Payer: Self-pay | Admitting: Family Medicine

## 2014-02-15 ENCOUNTER — Ambulatory Visit (INDEPENDENT_AMBULATORY_CARE_PROVIDER_SITE_OTHER): Payer: Medicare Other | Admitting: Family Medicine

## 2014-02-15 VITALS — BP 142/88 | Ht 61.0 in | Wt 118.0 lb

## 2014-02-15 DIAGNOSIS — K219 Gastro-esophageal reflux disease without esophagitis: Secondary | ICD-10-CM | POA: Diagnosis not present

## 2014-02-15 DIAGNOSIS — I499 Cardiac arrhythmia, unspecified: Secondary | ICD-10-CM | POA: Diagnosis not present

## 2014-02-15 MED ORDER — METOPROLOL TARTRATE 50 MG PO TABS: ORAL_TABLET | ORAL | Status: DC

## 2014-02-15 NOTE — Progress Notes (Signed)
   Subjective:    Patient ID: [name], female    DOB: 07-22-1940, 74 y.o.   MRN: [id number]  HPIMed check up.  Having a fullness in throat from time to time.  Reflux.    Irregular heart beat. Taking metoprolol. Hx in the last couple months pt will experience pressure in the chest, doesn't last very long just a minute or so  Usually at night, infrequently, May or may nt wake the pt up  Pt has gallblader  Off and on occurrence of these  Develops a transient fullness in ten throat, seems to assos with rapid run of heart rate  Exercising regularly staying very active hiking and walking and exercse,, not doing cardio hard effort.  orig re for metoprolo was the run of irreg beat  During the day time does not experience the cp     Rarely gets burping belching tast Wants to discuss if she should be taking a baby asa.    Review of Systems No headache no exertional chest pain no nausea no diaphoresis no abdominal pain no change in bowel habits    Objective:   Physical Exam  Alert no apparent distress. Vitals stable. HEENT normal. Lungs clear. Heart regular in rhythm. Abdomen benign.  EKG normal sinus rhythm no significant ST-T changes      Assessment & Plan:  Impression 1 substernal chest discomfort 1 minute in duration rare in occurrence almost exclusively at night. No association with daytime symptoms despite regular exercise program. Highly likely reflux discussed #2 throat fullness transient and association with irregular heartbeats. Also highly likely benign discussed plan reassurance. No major workup this time. At the end of visit patient noted she's had progressive difficulty with knees and would like to see an orthopedic doctor. We will get her a list of good orthopedic doctors. WSL

## 2014-03-12 ENCOUNTER — Encounter: Payer: Self-pay | Admitting: Family Medicine

## 2014-03-17 DIAGNOSIS — M1711 Unilateral primary osteoarthritis, right knee: Secondary | ICD-10-CM | POA: Diagnosis not present

## 2014-03-29 DIAGNOSIS — H903 Sensorineural hearing loss, bilateral: Secondary | ICD-10-CM | POA: Diagnosis not present

## 2014-05-12 DIAGNOSIS — L570 Actinic keratosis: Secondary | ICD-10-CM | POA: Diagnosis not present

## 2014-05-12 DIAGNOSIS — C44722 Squamous cell carcinoma of skin of right lower limb, including hip: Secondary | ICD-10-CM | POA: Diagnosis not present

## 2014-05-12 DIAGNOSIS — L821 Other seborrheic keratosis: Secondary | ICD-10-CM | POA: Diagnosis not present

## 2014-05-12 DIAGNOSIS — Z85828 Personal history of other malignant neoplasm of skin: Secondary | ICD-10-CM | POA: Diagnosis not present

## 2014-05-12 DIAGNOSIS — D485 Neoplasm of uncertain behavior of skin: Secondary | ICD-10-CM | POA: Diagnosis not present

## 2014-06-09 DIAGNOSIS — C44722 Squamous cell carcinoma of skin of right lower limb, including hip: Secondary | ICD-10-CM | POA: Diagnosis not present

## 2014-07-02 DIAGNOSIS — M899 Disorder of bone, unspecified: Secondary | ICD-10-CM | POA: Diagnosis not present

## 2014-07-19 ENCOUNTER — Encounter: Payer: Self-pay | Admitting: Family Medicine

## 2014-07-19 ENCOUNTER — Encounter: Payer: Self-pay | Admitting: *Deleted

## 2014-07-19 ENCOUNTER — Ambulatory Visit (INDEPENDENT_AMBULATORY_CARE_PROVIDER_SITE_OTHER): Payer: Medicare Other | Admitting: Family Medicine

## 2014-07-19 VITALS — BP 138/86 | Ht 60.75 in | Wt 118.0 lb

## 2014-07-19 DIAGNOSIS — Z0000 Encounter for general adult medical examination without abnormal findings: Secondary | ICD-10-CM | POA: Diagnosis not present

## 2014-07-19 DIAGNOSIS — Z79899 Other long term (current) drug therapy: Secondary | ICD-10-CM

## 2014-07-19 MED ORDER — IMIQUIMOD 5 % EX CREA
TOPICAL_CREAM | CUTANEOUS | Status: DC
Start: 2014-07-19 — End: 2014-08-31

## 2014-07-19 MED ORDER — SULFAMETHOXAZOLE-TRIMETHOPRIM 800-160 MG PO TABS: 1.0000 | ORAL_TABLET | Freq: Two times a day (BID) | ORAL | Status: DC

## 2014-07-19 NOTE — Progress Notes (Signed)
Subjective:    Patient ID: [name], female    DOB: 1940-08-07, 74 y.o.   MRN: [id number]  HPI AWV- Annual Wellness Visit  The patient was seen for their annual wellness visit. The patient's past medical history, surgical history, and family history were reviewed. Pertinent vaccines were reviewed ( tetanus, pneumonia, shingles, flu) The patient's medication list was reviewed and updated.  The height and weight were entered. The patient's current BMI is: 22.48  Cognitive screening was completed. Outcome of Mini - Cog: pass  Falls within the past 6 months: one fall. Walking across road at night in the dark. Fall assessment pass.   Current tobacco usage:  (All patients who use tobacco were given written and verbal information on quitting)  Recent listing of emergency department/hospitalizations over the past year were reviewed.  current specialist the patient sees on a regular basis: none   Medicare annual wellness visit patient questionnaire was reviewed.  A written screening schedule for the patient for the next 5-10 years was given. Appropriate discussion of followup regarding next visit was discussed.  Concerns about UTI. No symptoms now. Keep bactrim on hand and needs refill to keep on hand.   Wants to discuss stool test. Last colonoscopy was Oct  2014.   Colon was doing oct 2014  BMs doing so much better  Exercise wide open staying active, [place]  >this Results for orders placed or performed in visit on 01/22/13  Lipid panel  Result Value Ref Range   Cholesterol 185 0 - 200 mg/dL   Triglycerides 63 <150 mg/dL   HDL 65 >39 mg/dL   Total CHOL/HDL Ratio 2.8 Ratio   VLDL 13 0 - 40 mg/dL   LDL Cholesterol 107 (H) 0 - 99 mg/dL  Basic metabolic panel  Result Value Ref Range   Sodium 136 135 - 145 mEq/L   Potassium 4.5 3.5 - 5.3 mEq/L   Chloride 98 96 - 112 mEq/L   CO2 26 19 - 32 mEq/L   Glucose, Bld 86 70 - 99 mg/dL   BUN 11 6 - 23  mg/dL   Creat 0.64 0.50 - 1.10 mg/dL   Calcium 9.2 8.4 - 10.5 mg/dL  Hepatic function panel  Result Value Ref Range   Total Bilirubin 0.7 0.3 - 1.2 mg/dL   Bilirubin, Direct 0.1 0.0 - 0.3 mg/dL   Indirect Bilirubin 0.6 0.0 - 0.9 mg/dL   Alkaline Phosphatase 64 39 - 117 U/L   AST 18 0 - 37 U/L   ALT 20 0 - 35 U/L   Total Protein 6.8 6.0 - 8.3 g/dL   Albumin 4.6 3.5 - 5.2 g/dL      Review of Systems  Constitutional: Negative for activity change, appetite change and fatigue.  HENT: Negative for congestion, ear discharge and rhinorrhea.   Eyes: Negative for discharge.  Respiratory: Negative for cough, chest tightness and wheezing.   Cardiovascular: Negative for chest pain.  Gastrointestinal: Negative for vomiting and abdominal pain.  Genitourinary: Negative for frequency and difficulty urinating.  Musculoskeletal: Negative for neck pain.  Allergic/Immunologic: Negative for environmental allergies and food allergies.  Neurological: Negative for weakness and headaches.  Psychiatric/Behavioral: Negative for behavioral problems and agitation.  All other systems reviewed and are negative.      Objective:   Physical Exam  Constitutional: She is oriented to person, place, and time. She appears well-developed and well-nourished.  HENT:  Head: Normocephalic.  Right Ear: External ear normal.  Left Ear: External ear normal.  Eyes: Pupils are equal, round, and reactive to light.  Neck: Normal range of motion. No thyromegaly present.  Cardiovascular: Normal rate, regular rhythm, normal heart sounds and intact distal pulses.   No murmur heard. Pulmonary/Chest: Effort normal and breath sounds normal. No respiratory distress. She has no wheezes.  Abdominal: Soft. Bowel sounds are normal. She exhibits no distension and no mass. There is no tenderness.  Genitourinary:  Breast exam within normal limits  Musculoskeletal: Normal range of motion. She exhibits no edema or tenderness.    Lymphadenopathy:    She has no cervical adenopathy.  Neurological: She is alert and oriented to person, place, and time. She exhibits normal muscle tone.  Skin: Skin is warm and dry.  Psychiatric: She has a normal mood and affect. Her behavior is normal.  Vitals reviewed.         Assessment & Plan:  Impression well adult exam #2 osteopenia on bone density test. Test shows virtually same strength is 2 years ago. Positive family history of osteoporosis. Personal history of osteopenia but T score remains at 1.5 therefore recommend no prescription medication. Plan heme occult cards. Diet exercise discussed. WSL

## 2014-07-19 NOTE — Patient Instructions (Signed)
Molluscum Contagiosum Molluscum contagiosum is a viral infection of the skin that causes smooth surfaced, firm, small (3 to 5 mm), dome-shaped bumps (papules) which are flesh-colored. The bumps usually do not hurt or itch. In children, they most often appear on the face, trunk, arms and legs. In adults, the growths are commonly found on the genitals, thighs, face, neck, and belly (abdomen). The infection may be spread to others by close (skin to skin) contact (such as occurs in schools and swimming pools), sharing towels and clothing, and through sexual contact. The bumps usually disappear without treatment in 2 to 4 months, especially in children. You may have them treated to avoid spreading them. Scraping (curetting) the middle part (central plug) of the bump with a needle or sharp curette, or application of liquid nitrogen for 8 or 9 seconds usually cures the infection. HOME CARE INSTRUCTIONS   Do not scratch the bumps. This may spread the infection to other parts of the body and to other people.  Avoid close contact with others, including sexual contact, until the bumps disappear. Do not share towels or clothing.  If liquid nitrogen was used, blisters will form. Leave the blisters alone and cover with a bandage. The tops will fall off by themselves in 7 to 14 days.  Four months without a lesion is usually a cure. SEEK IMMEDIATE MEDICAL CARE IF:  You have a fever.  You develop swelling, redness, pain, tenderness, or warmth in the areas of the bumps. They may be infected. Document Released: 01/20/2000 Document Revised: 04/16/2011 Document Reviewed: 07/02/2008 [facility] Patient Information 2015 [place], [place]. This information is not intended to replace advice given to you by your health care provider. Make sure you discuss any questions you have with your health care provider.

## 2014-07-22 ENCOUNTER — Telehealth: Payer: Self-pay | Admitting: *Deleted

## 2014-07-22 NOTE — Telephone Encounter (Signed)
Copy of pt's bone density test from [facility] sent back in folder on bookshelf. Pt in office this week.

## 2014-07-23 NOTE — Telephone Encounter (Signed)
[facility] 6/17

## 2014-07-23 NOTE — Telephone Encounter (Signed)
Notify pt bones nearly as strong as before only fiv per cent further loss, t score still wy too low at 1.5 to consider rx meds, even with fam hx of osteoporosis, stay on same approach

## 2014-07-26 ENCOUNTER — Encounter: Payer: Self-pay | Admitting: Family Medicine

## 2014-07-27 NOTE — Telephone Encounter (Signed)
[facility] 07/27/14

## 2014-07-28 NOTE — Telephone Encounter (Signed)
Discussed with patient. Patient advised bones nearly as strong as before only five percent further loss, t- score still way too low at 1.5 to consider rx meds, even with fam hx of osteoporosis, stay on same approach. Patient verbalized understanding.

## 2014-07-28 NOTE — Telephone Encounter (Signed)
[facility] 6/22

## 2014-08-02 ENCOUNTER — Other Ambulatory Visit: Payer: Self-pay

## 2014-08-24 ENCOUNTER — Telehealth: Payer: Self-pay | Admitting: Family Medicine

## 2014-08-24 ENCOUNTER — Other Ambulatory Visit: Payer: Self-pay

## 2014-08-24 DIAGNOSIS — T148 Other injury of unspecified body region: Secondary | ICD-10-CM | POA: Diagnosis not present

## 2014-08-24 DIAGNOSIS — Z79899 Other long term (current) drug therapy: Secondary | ICD-10-CM | POA: Diagnosis not present

## 2014-08-24 DIAGNOSIS — W57XXXA Bitten or stung by nonvenomous insect and other nonvenomous arthropods, initial encounter: Secondary | ICD-10-CM

## 2014-08-24 DIAGNOSIS — Z0000 Encounter for general adult medical examination without abnormal findings: Secondary | ICD-10-CM | POA: Diagnosis not present

## 2014-08-24 NOTE — Telephone Encounter (Signed)
Patient walked in today stating that she was going in for routine labs at [facility] ordered by Dr.Steve. She stated that she had some recent tick bites with muscle stiffness and wanted to know if we could add a lyme titter to her blood work so that she would not have to get stuck multiple times. This nurse talked with Dr. Nicki Reaper and told him patient's concerns. Dr.Scott gave me a verbal order to do a Lyme disease titter on the patient and to have her follow up with Dr.Steve within the next few weeks. Patient verbalized understanding.

## 2014-08-25 LAB — BASIC METABOLIC PANEL
BUN/Creatinine Ratio: 14 (ref 11–26)
BUN: 10 mg/dL (ref 8–27)
CALCIUM: 9.6 mg/dL (ref 8.7–10.3)
CHLORIDE: 98 mmol/L (ref 97–108)
CO2: 26 mmol/L (ref 18–29)
Creatinine, Ser: 0.73 mg/dL (ref 0.57–1.00)
GFR calc Af Amer: 93 mL/min/{1.73_m2} (ref >59)
GFR, EST NON AFRICAN AMERICAN: 81 mL/min/{1.73_m2} (ref >59)
GLUCOSE: 91 mg/dL (ref 65–99)
POTASSIUM: 4.6 mmol/L (ref 3.5–5.2)
SODIUM: 138 mmol/L (ref 134–144)

## 2014-08-25 LAB — HEPATIC FUNCTION PANEL
ALT: 20 IU/L (ref 0–32)
AST: 18 IU/L (ref 0–40)
Albumin: 4.9 g/dL — ABNORMAL HIGH (ref 3.5–4.8)
Alkaline Phosphatase: 67 IU/L (ref 39–117)
BILIRUBIN TOTAL: 0.6 mg/dL (ref 0.0–1.2)
BILIRUBIN, DIRECT: 0.16 mg/dL (ref 0.00–0.40)
TOTAL PROTEIN: 7 g/dL (ref 6.0–8.5)

## 2014-08-25 LAB — LIPID PANEL
CHOL/HDL RATIO: 2.2 ratio (ref 0.0–4.4)
Cholesterol, Total: 216 mg/dL — ABNORMAL HIGH (ref 100–199)
HDL: 98 mg/dL (ref >39)
LDL Calculated: 103 mg/dL — ABNORMAL HIGH (ref 0–99)
TRIGLYCERIDES: 76 mg/dL (ref 0–149)
VLDL Cholesterol Cal: 15 mg/dL (ref 5–40)

## 2014-08-25 LAB — B. BURGDORFI ANTIBODIES: Lyme IgG/IgM Ab: <0.91 {ISR} (ref 0.00–0.90)

## 2014-08-31 ENCOUNTER — Encounter: Payer: Self-pay | Admitting: Nurse Practitioner

## 2014-08-31 ENCOUNTER — Ambulatory Visit (INDEPENDENT_AMBULATORY_CARE_PROVIDER_SITE_OTHER): Payer: Medicare Other | Admitting: Nurse Practitioner

## 2014-08-31 VITALS — BP 130/82 | Ht 61.0 in | Wt 119.0 lb

## 2014-08-31 DIAGNOSIS — E782 Mixed hyperlipidemia: Secondary | ICD-10-CM | POA: Diagnosis not present

## 2014-09-01 ENCOUNTER — Encounter: Payer: Self-pay | Admitting: Nurse Practitioner

## 2014-09-01 NOTE — Progress Notes (Signed)
Subjective:  Presents to discuss most recent lab work. Healthy diet. Active lifestyle.  Objective:   BP 130/82 mmHg  Ht 5\' 1"  (1.549 m)  Wt 119 lb (53.978 kg)  BMI 22.50 kg/m2 NAD. Alert, oriented. Reviewed results from most recent lab work 08/24/2014. Questions and concerns discussed. HDL 98, LDL 103.  Assessment: Mixed hyperlipidemia resolved  Plan: Continue healthy diet and regular activity. Reassured about cholesterol due to extremely high HDL and LDL close to 100. Resume routine follow-up.

## 2014-09-06 DIAGNOSIS — Z1231 Encounter for screening mammogram for malignant neoplasm of breast: Secondary | ICD-10-CM | POA: Diagnosis not present

## 2014-09-22 ENCOUNTER — Other Ambulatory Visit: Payer: Self-pay | Admitting: *Deleted

## 2014-09-22 DIAGNOSIS — L821 Other seborrheic keratosis: Secondary | ICD-10-CM | POA: Diagnosis not present

## 2014-09-22 DIAGNOSIS — Z0000 Encounter for general adult medical examination without abnormal findings: Secondary | ICD-10-CM

## 2014-09-22 DIAGNOSIS — D485 Neoplasm of uncertain behavior of skin: Secondary | ICD-10-CM | POA: Diagnosis not present

## 2014-09-22 DIAGNOSIS — L820 Inflamed seborrheic keratosis: Secondary | ICD-10-CM | POA: Diagnosis not present

## 2014-09-22 DIAGNOSIS — Z85828 Personal history of other malignant neoplasm of skin: Secondary | ICD-10-CM | POA: Diagnosis not present

## 2014-09-22 LAB — POC HEMOCCULT BLD/STL (HOME/3-CARD/SCREEN)
Card #2 Fecal Occult Blod, POC: NEGATIVE
Card #3 Fecal Occult Blood, POC: NEGATIVE
FECAL OCCULT BLD: NEGATIVE

## 2014-10-01 ENCOUNTER — Encounter: Payer: Self-pay | Admitting: Family Medicine

## 2014-11-19 ENCOUNTER — Telehealth: Payer: Self-pay | Admitting: Family Medicine

## 2014-11-19 MED ORDER — METOPROLOL TARTRATE 50 MG PO TABS
ORAL_TABLET | ORAL | Status: DC
Start: 2014-11-19 — End: 2015-04-08

## 2014-11-19 NOTE — Telephone Encounter (Signed)
Called patient and informed her that prescription was sent into requested pharmacy.

## 2014-11-19 NOTE — Telephone Encounter (Signed)
Patient is out of town and needing medication refill on metoprolol 50 mg. She would like 90 day supply if possible. Faxed to [facility] in [place] #[phone]

## 2014-12-16 ENCOUNTER — Ambulatory Visit (INDEPENDENT_AMBULATORY_CARE_PROVIDER_SITE_OTHER): Payer: Medicare Other

## 2014-12-16 DIAGNOSIS — Z23 Encounter for immunization: Secondary | ICD-10-CM | POA: Diagnosis not present

## 2015-01-20 DIAGNOSIS — H04522 Eversion of left lacrimal punctum: Secondary | ICD-10-CM | POA: Diagnosis not present

## 2015-02-16 DIAGNOSIS — L821 Other seborrheic keratosis: Secondary | ICD-10-CM | POA: Diagnosis not present

## 2015-02-16 DIAGNOSIS — C44519 Basal cell carcinoma of skin of other part of trunk: Secondary | ICD-10-CM | POA: Diagnosis not present

## 2015-02-16 DIAGNOSIS — D1801 Hemangioma of skin and subcutaneous tissue: Secondary | ICD-10-CM | POA: Diagnosis not present

## 2015-02-16 DIAGNOSIS — Z85828 Personal history of other malignant neoplasm of skin: Secondary | ICD-10-CM | POA: Diagnosis not present

## 2015-02-16 DIAGNOSIS — D485 Neoplasm of uncertain behavior of skin: Secondary | ICD-10-CM | POA: Diagnosis not present

## 2015-02-16 DIAGNOSIS — L812 Freckles: Secondary | ICD-10-CM | POA: Diagnosis not present

## 2015-02-16 DIAGNOSIS — D225 Melanocytic nevi of trunk: Secondary | ICD-10-CM | POA: Diagnosis not present

## 2015-02-17 ENCOUNTER — Other Ambulatory Visit: Payer: Self-pay | Admitting: Family Medicine

## 2015-03-02 DIAGNOSIS — H538 Other visual disturbances: Secondary | ICD-10-CM | POA: Diagnosis not present

## 2015-03-28 DIAGNOSIS — M1711 Unilateral primary osteoarthritis, right knee: Secondary | ICD-10-CM | POA: Diagnosis not present

## 2015-03-28 DIAGNOSIS — M25561 Pain in right knee: Secondary | ICD-10-CM | POA: Diagnosis not present

## 2015-04-08 ENCOUNTER — Other Ambulatory Visit: Payer: Self-pay | Admitting: Family Medicine

## 2015-04-21 DIAGNOSIS — C44519 Basal cell carcinoma of skin of other part of trunk: Secondary | ICD-10-CM | POA: Diagnosis not present

## 2015-04-22 ENCOUNTER — Ambulatory Visit (INDEPENDENT_AMBULATORY_CARE_PROVIDER_SITE_OTHER): Payer: Medicare Other | Admitting: Family Medicine

## 2015-04-22 ENCOUNTER — Encounter: Payer: Self-pay | Admitting: Family Medicine

## 2015-04-22 VITALS — BP 172/108 | Ht 60.75 in | Wt 122.0 lb

## 2015-04-22 DIAGNOSIS — I10 Essential (primary) hypertension: Secondary | ICD-10-CM | POA: Diagnosis not present

## 2015-04-22 MED ORDER — METOPROLOL TARTRATE 50 MG PO TABS: ORAL_TABLET | ORAL | Status: DC

## 2015-04-22 MED ORDER — LOSARTAN POTASSIUM 50 MG PO TABS: 50.0000 mg | ORAL_TABLET | ORAL | Status: DC

## 2015-04-22 NOTE — Progress Notes (Signed)
   Subjective:    Patient ID: [name], female    DOB: 01/21/1941, 75 y.o.   MRN: [id number]  Hypertension This is a chronic problem. The current episode started more than 1 year ago. Treatments tried: metoprolol. There are no compliance problems (very active, eats healthy).    BP yesterday was 191/104, 182/98, 173/97, 168/101, 182/98, 176/98. Increased metoprolol to one whole tablet last night and one whole tab this am instead of one half bid.   Today BP 162/95, 169/91.   Numbers decent in the past,    BP. Patient states completely compliant with medications   Review of Systems  no headache no chest pain no back pain abdominal pain no change in bowel habits    Objective:   Physical Exam   alert vital stable HET normal blood pressure repeat 162/92 both arms. Lungs clear. Heart regular in rhythm ankles without edema      Assessment & Plan:   impression hypertension suboptimal control discussed at great length. We can questions answered 25 minutes spent most in discussion plan maintain 1 metoprolol twice a day. Add losartan 50 every morning side effects benefits discussed. Symptom care discussed. Other questions answered chronic blood work follow-up as scheduled WSL

## 2015-04-26 DIAGNOSIS — M1711 Unilateral primary osteoarthritis, right knee: Secondary | ICD-10-CM | POA: Diagnosis not present

## 2015-05-03 ENCOUNTER — Other Ambulatory Visit: Payer: Self-pay | Admitting: Family Medicine

## 2015-05-03 DIAGNOSIS — M1711 Unilateral primary osteoarthritis, right knee: Secondary | ICD-10-CM | POA: Diagnosis not present

## 2015-05-11 DIAGNOSIS — M1711 Unilateral primary osteoarthritis, right knee: Secondary | ICD-10-CM | POA: Diagnosis not present

## 2015-05-17 ENCOUNTER — Encounter: Payer: Self-pay | Admitting: Family Medicine

## 2015-05-17 ENCOUNTER — Ambulatory Visit (INDEPENDENT_AMBULATORY_CARE_PROVIDER_SITE_OTHER): Payer: Medicare Other | Admitting: Family Medicine

## 2015-05-17 ENCOUNTER — Ambulatory Visit (HOSPITAL_COMMUNITY)
Admission: RE | Admit: 2015-05-17 | Discharge: 2015-05-17 | Disposition: A | Discharge status: Home / Self Care | Payer: Medicare Other | Source: Ambulatory Visit | Attending: Family Medicine | Admitting: Family Medicine

## 2015-05-17 VITALS — BP 132/86 | Ht 60.75 in | Wt 122.0 lb

## 2015-05-17 DIAGNOSIS — M1611 Unilateral primary osteoarthritis, right hip: Secondary | ICD-10-CM | POA: Diagnosis not present

## 2015-05-17 DIAGNOSIS — K2100 Gastro-esophageal reflux disease with esophagitis, without bleeding: Secondary | ICD-10-CM

## 2015-05-17 DIAGNOSIS — M19071 Primary osteoarthritis, right ankle and foot: Secondary | ICD-10-CM | POA: Diagnosis not present

## 2015-05-17 DIAGNOSIS — M79661 Pain in right lower leg: Secondary | ICD-10-CM | POA: Diagnosis not present

## 2015-05-17 DIAGNOSIS — M8580 Other specified disorders of bone density and structure, unspecified site: Secondary | ICD-10-CM | POA: Diagnosis not present

## 2015-05-17 DIAGNOSIS — M1711 Unilateral primary osteoarthritis, right knee: Secondary | ICD-10-CM | POA: Diagnosis not present

## 2015-05-17 MED ORDER — METOPROLOL TARTRATE 50 MG PO TABS: ORAL_TABLET | ORAL | Status: DC

## 2015-05-17 MED ORDER — LOSARTAN POTASSIUM 50 MG PO TABS: 50.0000 mg | ORAL_TABLET | ORAL | Status: DC

## 2015-05-17 NOTE — Progress Notes (Signed)
   Subjective:    Patient ID: [name], female    DOB: 20-Mar-1940, 75 y.o.   MRN: [id number]  Hypertension This is a chronic problem. Treatments tried: losartan, metoprolol. There are no compliance problems.   pt brought in copy of bp readings. Blood pressure numbers much improved on current regimen.  Pt wants to discuss bone density report. Done in May of 2016.  Pt has stopped taking calcium because of reports she has read about calcium. Pt using yogurt daily, and taking a glass of milk regulaly. His stopped her calcium supplement. Bone density test reveals osteopenia  Pain in both hips. Pain for about 6 months. Sciatica pain sometime, more often and right leg. Also pain in the hands  Pain in right shin. Started last summer. Pt states she had skin cancer in the same area the pain is in. Worry somewhat about whether the cancer moderate spread to her shin.   Review of Systems No headache, no major weight loss or weight gain, no chest pain no back pain abdominal pain no change in bowel habits complete ROS otherwise negative     Objective:   Physical Exam Alert vital stable no acute distress H&T normal lungs clear heart regular rhythm hip good internal and external rotation bilateral some pain bilateral. Negative straight leg raise on the right right anterior shin some tenderness to deep palpation ankles without edema blood pressure good on repeat       Assessment & Plan:  Impression hypertension improved control discussed #2 osteopenia discussed patient should maintain calcium and vitamin D. Rationale discussed. #3 right leg pain with concern on part patient #4 history of intermittent sciatica not a big problem now #5 probable element of hip arthritis discuss plan diet exercise discussed. When necessary medication use of anti-inflammatory discussed maintain same blood pressure meds recheck Year for wellness exam WSL

## 2015-05-21 DIAGNOSIS — M1611 Unilateral primary osteoarthritis, right hip: Secondary | ICD-10-CM | POA: Insufficient documentation

## 2015-05-21 DIAGNOSIS — M8580 Other specified disorders of bone density and structure, unspecified site: Secondary | ICD-10-CM | POA: Insufficient documentation

## 2015-06-22 DIAGNOSIS — M1711 Unilateral primary osteoarthritis, right knee: Secondary | ICD-10-CM | POA: Diagnosis not present

## 2015-08-16 ENCOUNTER — Encounter: Payer: Self-pay | Admitting: Family Medicine

## 2015-08-16 ENCOUNTER — Ambulatory Visit (INDEPENDENT_AMBULATORY_CARE_PROVIDER_SITE_OTHER): Payer: Medicare Other | Admitting: Family Medicine

## 2015-08-16 VITALS — BP 120/80 | Temp 98.1°F | Ht 60.75 in | Wt 120.4 lb

## 2015-08-16 DIAGNOSIS — R21 Rash and other nonspecific skin eruption: Secondary | ICD-10-CM | POA: Diagnosis not present

## 2015-08-16 MED ORDER — DOXYCYCLINE HYCLATE 100 MG PO TABS: 100.0000 mg | ORAL_TABLET | Freq: Two times a day (BID) | ORAL | Status: DC

## 2015-08-16 NOTE — Progress Notes (Signed)
   Subjective:    Patient ID: [name], female    DOB: Dec 10, 1940, 75 y.o.   MRN: [id number]  Rash This is a new problem. The current episode started in the past 7 days. The affected locations include the face and groin. It is unknown if there was an exposure to a precipitant. Treatments tried: Saline Soaks.   Patient has concerns of mole to left shoulder.   Below eyelid and irritated, now spreading  Now has a tick bite with a rash, then the rash went  Now resolved today,,  No working theories on the facial rash, usually uses seventy or similar  Some use of wart complound applic to a gson     Review of Systems  Skin: Positive for rash.   No cough no congestion no headache    Objective:   Physical Exam  Alert vital stable face abruption impetigo-like in nature crusty yellow region swollen comply corrections and tenderness and surrounding inflammation      Assessment & Plan:   Impression impetigo plan docs he 100 twice a day 10 days local measures discussed multiple questions answered

## 2015-08-31 ENCOUNTER — Other Ambulatory Visit: Payer: Self-pay | Admitting: Family Medicine

## 2015-09-19 ENCOUNTER — Encounter: Payer: Self-pay | Admitting: Family Medicine

## 2015-09-19 ENCOUNTER — Ambulatory Visit (INDEPENDENT_AMBULATORY_CARE_PROVIDER_SITE_OTHER): Payer: Medicare Other | Admitting: Family Medicine

## 2015-09-19 VITALS — BP 120/74 | Temp 98.6°F | Ht 60.75 in | Wt 120.0 lb

## 2015-09-19 DIAGNOSIS — R300 Dysuria: Secondary | ICD-10-CM

## 2015-09-19 DIAGNOSIS — Z87440 Personal history of urinary (tract) infections: Secondary | ICD-10-CM | POA: Diagnosis not present

## 2015-09-19 DIAGNOSIS — N3000 Acute cystitis without hematuria: Secondary | ICD-10-CM

## 2015-09-19 LAB — POCT URINALYSIS DIPSTICK
PH UA: 8
Spec Grav, UA: <=1.005

## 2015-09-19 MED ORDER — LOSARTAN POTASSIUM 50 MG PO TABS: 50.0000 mg | ORAL_TABLET | Freq: Every morning | ORAL | 0 refills | Status: DC

## 2015-09-19 MED ORDER — SULFAMETHOXAZOLE-TRIMETHOPRIM 800-160 MG PO TABS: 1.0000 | ORAL_TABLET | Freq: Two times a day (BID) | ORAL | 1 refills | Status: DC

## 2015-09-19 NOTE — Progress Notes (Signed)
   Subjective:    Patient ID: [name], female    DOB: 06-12-1940, 75 y.o.   MRN: [id number]  Urinary Tract Infection   This is a new problem. The current episode started yesterday. Pertinent negatives include no flank pain. Treatments tried: azo, bactrim.   Pt would like a refill on antibiotic because she states she gets frequent uti.  She denies high fevers chills sweats  Review of Systems  Constitutional: Negative for fatigue and fever.  Respiratory: Negative for cough.   Gastrointestinal: Negative for abdominal pain.  Genitourinary: Positive for dysuria. Negative for flank pain.       Objective:   Physical Exam  Constitutional: She appears well-developed.  HENT:  Head: Normocephalic.  Nose: Nose normal.  Mouth/Throat: Oropharynx is clear and moist. No oropharyngeal exudate.  Neck: Neck supple.  Cardiovascular: Normal rate and normal heart sounds.   No murmur heard. Pulmonary/Chest: Effort normal and breath sounds normal. She has no wheezes.  Abdominal: Soft. She exhibits no distension. There is no tenderness.  Lymphadenopathy:    She has no cervical adenopathy.  Skin: Skin is warm and dry.  Nursing note and vitals reviewed.   Patient overall doing very well lungs clear heart regular abdomen soft      Assessment & Plan:  UTI antibiotics prescribed warning signs discussed follow-up if problems

## 2015-09-19 NOTE — Addendum Note (Signed)
Addended by: Carmelina Noun on: 09/19/2015 01:39 PM   Modules accepted: Orders

## 2015-09-21 ENCOUNTER — Other Ambulatory Visit: Payer: Self-pay

## 2015-09-21 LAB — URINE CULTURE

## 2015-09-21 MED ORDER — SULFAMETHOXAZOLE-TRIMETHOPRIM 800-160 MG PO TABS: 1.0000 | ORAL_TABLET | Freq: Two times a day (BID) | ORAL | 1 refills | Status: DC

## 2015-09-21 MED ORDER — CEFPROZIL 500 MG PO TABS
500.0000 mg | ORAL_TABLET | Freq: Two times a day (BID) | ORAL | 0 refills | Status: DC
Start: 2015-09-21 — End: 2015-09-22

## 2015-09-22 MED ORDER — CEFPROZIL 500 MG PO TABS: 500.0000 mg | ORAL_TABLET | Freq: Two times a day (BID) | ORAL | 0 refills | Status: DC

## 2015-09-22 NOTE — Addendum Note (Signed)
Addended by: Ofilia Neas R on: 09/22/2015 02:08 PM   Modules accepted: Orders

## 2015-10-04 DIAGNOSIS — Z1231 Encounter for screening mammogram for malignant neoplasm of breast: Secondary | ICD-10-CM | POA: Diagnosis not present

## 2015-10-07 ENCOUNTER — Telehealth: Payer: Self-pay | Admitting: Family Medicine

## 2015-10-07 NOTE — Telephone Encounter (Signed)
Please review screening mammogram results in Results folder, from [place].

## 2015-12-01 ENCOUNTER — Other Ambulatory Visit: Payer: Self-pay | Admitting: Family Medicine

## 2015-12-09 ENCOUNTER — Encounter: Payer: Self-pay | Admitting: Family Medicine

## 2015-12-09 DIAGNOSIS — M8580 Other specified disorders of bone density and structure, unspecified site: Secondary | ICD-10-CM

## 2015-12-12 DIAGNOSIS — M1711 Unilateral primary osteoarthritis, right knee: Secondary | ICD-10-CM | POA: Diagnosis not present

## 2015-12-13 ENCOUNTER — Encounter: Payer: Self-pay | Admitting: Family Medicine

## 2015-12-13 ENCOUNTER — Ambulatory Visit (INDEPENDENT_AMBULATORY_CARE_PROVIDER_SITE_OTHER): Payer: Medicare Other | Admitting: Family Medicine

## 2015-12-13 VITALS — BP 132/80 | Ht 61.0 in | Wt 120.0 lb

## 2015-12-13 DIAGNOSIS — I10 Essential (primary) hypertension: Secondary | ICD-10-CM | POA: Diagnosis not present

## 2015-12-13 DIAGNOSIS — Z23 Encounter for immunization: Secondary | ICD-10-CM | POA: Diagnosis not present

## 2015-12-13 DIAGNOSIS — Z0000 Encounter for general adult medical examination without abnormal findings: Secondary | ICD-10-CM

## 2015-12-13 MED ORDER — METOPROLOL TARTRATE 50 MG PO TABS: ORAL_TABLET | ORAL | 1 refills | Status: DC

## 2015-12-13 MED ORDER — LOSARTAN POTASSIUM 50 MG PO TABS: 50.0000 mg | ORAL_TABLET | Freq: Every morning | ORAL | 0 refills | Status: DC

## 2015-12-13 NOTE — Progress Notes (Signed)
   Subjective:    Patient ID: [name], female    DOB: 10/21/1940, 75 y.o.   MRN: [id number]  HPI  AWV- Annual Wellness Visit  The patient was seen for their annual wellness visit. The patient's past medical history, surgical history, and family history were reviewed. Pertinent vaccines were reviewed ( tetanus, pneumonia, shingles, flu) The patient's medication list was reviewed and updated.  The height and weight were entered. The patient's current BMI is: 22.67 kg/m  Cognitive screening was completed. Outcome of Mini - Cog: Pass  Falls within the past 6 months:None   Current tobacco usage: None  (All patients who use tobacco were given written and verbal information on quitting)  Recent listing of emergency department/hospitalizations over the past year were reviewed.  current specialist the patient sees on a regular basis: Patient see Orthopedic specialist for knee. Every 6 months.    Medicare annual wellness visit patient questionnaire was reviewed.  A written screening schedule for the patient for the next 5-10 years was given. Appropriate discussion of followup regarding next visit was discussed.  Pt had very bad pneum vaccine rsn in the past    Blood pressure medicine and blood pressure levels reviewed today with patient. Compliant with blood pressure medicine. States does not miss a dose. No obvious side effects. Blood pressure generally good when checked elsewhere. Watching salt intake.  Pos hx of osteopenia 124 ovder 78  Review of Systems  Constitutional: Negative.  Negative for activity change, appetite change and fatigue.  HENT: Negative for congestion, ear discharge and rhinorrhea.   Eyes: Negative for discharge.  Respiratory: Negative for cough, chest tightness and wheezing.   Cardiovascular: Negative for chest pain.  Gastrointestinal: Negative for abdominal pain and vomiting.  Genitourinary: Negative for difficulty urinating and frequency.    Musculoskeletal: Negative for neck pain.  Allergic/Immunologic: Negative for environmental allergies and food allergies.  Neurological: Negative for weakness and headaches.  Psychiatric/Behavioral: Negative for agitation and behavioral problems.  All other systems reviewed and are negative.      Objective:   Physical Exam  Constitutional: She is oriented to person, place, and time. She appears well-developed and well-nourished.  HENT:  Head: Normocephalic.  Right Ear: External ear normal.  Left Ear: External ear normal.  Eyes: Pupils are equal, round, and reactive to light.  Neck: Normal range of motion. No thyromegaly present.  Cardiovascular: Normal rate, regular rhythm, normal heart sounds and intact distal pulses.   No murmur heard. Pulmonary/Chest: Effort normal and breath sounds normal. No respiratory distress. She has no wheezes.  Abdominal: Soft. Bowel sounds are normal. She exhibits no distension and no mass. There is no tenderness.  Musculoskeletal: Normal range of motion. She exhibits no edema or tenderness.  Lymphadenopathy:    She has no cervical adenopathy.  Neurological: She is alert and oriented to person, place, and time. She exhibits normal muscle tone.  Skin: Skin is warm and dry.  Psychiatric: She has a normal mood and affect. Her behavior is normal.  Vitals reviewed.         Assessment & Plan:  Well adult exam up-to-date on colonoscopy. Diet/exercise discussed up-to-date on vaccinations Hemoccult cards today #2 hypertension good control discussed maintain same meds #3 osteopenia bone density test stable discussed to maintain vitamin D and calcium supplement. Appropriate blood work discussed. Medications refilled.

## 2015-12-19 DIAGNOSIS — M1711 Unilateral primary osteoarthritis, right knee: Secondary | ICD-10-CM | POA: Diagnosis not present

## 2015-12-20 DIAGNOSIS — Z0000 Encounter for general adult medical examination without abnormal findings: Secondary | ICD-10-CM | POA: Diagnosis not present

## 2015-12-21 LAB — LIPID PANEL
CHOL/HDL RATIO: 2.1 ratio (ref 0.0–4.4)
Cholesterol, Total: 214 mg/dL — ABNORMAL HIGH (ref 100–199)
HDL: 102 mg/dL (ref >39)
LDL CALC: 100 mg/dL — AB (ref 0–99)
Triglycerides: 62 mg/dL (ref 0–149)
VLDL CHOLESTEROL CAL: 12 mg/dL (ref 5–40)

## 2015-12-21 LAB — BASIC METABOLIC PANEL
BUN/Creatinine Ratio: 18 (ref 12–28)
BUN: 12 mg/dL (ref 8–27)
CALCIUM: 9.9 mg/dL (ref 8.7–10.3)
CO2: 26 mmol/L (ref 18–29)
CREATININE: 0.67 mg/dL (ref 0.57–1.00)
Chloride: 97 mmol/L (ref 96–106)
GFR calc Af Amer: 99 mL/min/{1.73_m2} (ref >59)
GFR, EST NON AFRICAN AMERICAN: 86 mL/min/{1.73_m2} (ref >59)
GLUCOSE: 92 mg/dL (ref 65–99)
Potassium: 4.9 mmol/L (ref 3.5–5.2)
Sodium: 140 mmol/L (ref 134–144)

## 2015-12-21 LAB — HEPATIC FUNCTION PANEL
ALBUMIN: 4.9 g/dL — AB (ref 3.5–4.8)
ALK PHOS: 57 IU/L (ref 39–117)
ALT: 25 IU/L (ref 0–32)
AST: 23 IU/L (ref 0–40)
Bilirubin Total: 0.6 mg/dL (ref 0.0–1.2)
Bilirubin, Direct: 0.17 mg/dL (ref 0.00–0.40)
TOTAL PROTEIN: 6.7 g/dL (ref 6.0–8.5)

## 2015-12-23 ENCOUNTER — Encounter: Payer: Self-pay | Admitting: Family Medicine

## 2015-12-23 ENCOUNTER — Other Ambulatory Visit: Payer: Self-pay | Admitting: *Deleted

## 2015-12-23 DIAGNOSIS — Z0000 Encounter for general adult medical examination without abnormal findings: Secondary | ICD-10-CM

## 2015-12-23 LAB — POC HEMOCCULT BLD/STL (HOME/3-CARD/SCREEN)
Card #2 Fecal Occult Blod, POC: NEGATIVE
FECAL OCCULT BLD: NEGATIVE
Fecal Occult Blood, POC: NEGATIVE

## 2015-12-26 DIAGNOSIS — M1711 Unilateral primary osteoarthritis, right knee: Secondary | ICD-10-CM | POA: Diagnosis not present

## 2016-01-18 DIAGNOSIS — M1711 Unilateral primary osteoarthritis, right knee: Secondary | ICD-10-CM | POA: Diagnosis not present

## 2016-01-24 DIAGNOSIS — M1711 Unilateral primary osteoarthritis, right knee: Secondary | ICD-10-CM | POA: Diagnosis not present

## 2016-02-08 DIAGNOSIS — M1711 Unilateral primary osteoarthritis, right knee: Secondary | ICD-10-CM | POA: Diagnosis not present

## 2016-02-09 DIAGNOSIS — M5136 Other intervertebral disc degeneration, lumbar region: Secondary | ICD-10-CM | POA: Diagnosis not present

## 2016-02-09 DIAGNOSIS — M25552 Pain in left hip: Secondary | ICD-10-CM | POA: Diagnosis not present

## 2016-02-10 DIAGNOSIS — M1711 Unilateral primary osteoarthritis, right knee: Secondary | ICD-10-CM | POA: Diagnosis not present

## 2016-02-14 DIAGNOSIS — M1711 Unilateral primary osteoarthritis, right knee: Secondary | ICD-10-CM | POA: Diagnosis not present

## 2016-02-16 DIAGNOSIS — M1711 Unilateral primary osteoarthritis, right knee: Secondary | ICD-10-CM | POA: Diagnosis not present

## 2016-02-17 DIAGNOSIS — L814 Other melanin hyperpigmentation: Secondary | ICD-10-CM | POA: Diagnosis not present

## 2016-02-17 DIAGNOSIS — L821 Other seborrheic keratosis: Secondary | ICD-10-CM | POA: Diagnosis not present

## 2016-02-17 DIAGNOSIS — L570 Actinic keratosis: Secondary | ICD-10-CM | POA: Diagnosis not present

## 2016-02-17 DIAGNOSIS — L905 Scar conditions and fibrosis of skin: Secondary | ICD-10-CM | POA: Diagnosis not present

## 2016-02-17 DIAGNOSIS — Z85828 Personal history of other malignant neoplasm of skin: Secondary | ICD-10-CM | POA: Diagnosis not present

## 2016-02-20 DIAGNOSIS — M1711 Unilateral primary osteoarthritis, right knee: Secondary | ICD-10-CM | POA: Diagnosis not present

## 2016-02-21 DIAGNOSIS — M1711 Unilateral primary osteoarthritis, right knee: Secondary | ICD-10-CM | POA: Diagnosis not present

## 2016-02-24 ENCOUNTER — Telehealth: Payer: Self-pay | Admitting: Family Medicine

## 2016-02-24 ENCOUNTER — Encounter: Payer: Self-pay | Admitting: Family Medicine

## 2016-02-24 ENCOUNTER — Ambulatory Visit (INDEPENDENT_AMBULATORY_CARE_PROVIDER_SITE_OTHER): Payer: Medicare Other | Admitting: Family Medicine

## 2016-02-24 VITALS — BP 168/98 | Ht 61.0 in | Wt 121.0 lb

## 2016-02-24 DIAGNOSIS — R55 Syncope and collapse: Secondary | ICD-10-CM

## 2016-02-24 DIAGNOSIS — I10 Essential (primary) hypertension: Secondary | ICD-10-CM | POA: Diagnosis not present

## 2016-02-24 NOTE — Telephone Encounter (Signed)
Patient scheduled office visit today for follow up

## 2016-02-24 NOTE — Progress Notes (Signed)
   Subjective:    Patient ID: [name], female    DOB: 25-Dec-1940, 76 y.o.   MRN: [id number]  Hypertension  This is a chronic problem. The current episode started more than 1 year ago.  pt brought in bp readings.   Passed out last night. Called ems but did not transport to hospital.   intnese pain , severe in nature  Felt very dizzy  Felt like was going to pass out  Passed out completely  Then completely passed out  Pt saw hte e  Ems folks did a telemetry,All normal per patient.  147 ove 78    BP has been up at times the past recent weeks. Particularly last few days. Patient brings in numbers most in the 150s to 160 no spot number of per 80s to low 90s  Review of Systems No headache, no major weight loss or weight gain, no chest pain no back pain abdominal pain no change in bowel habits complete ROS otherwise negative     Objective:   Physical Exam Alert vitals stable, NAD. Blood pressure Still mildly elevated on repeat. HEENT normal. Lungs clear. Heart regular rate and rhythm. Blood pressure 152/88 bilateral.  No major orthostatic changes     Assessment & Plan:  Impression #1 status post vasovagal syncope. Occurred in the background of sudden intense neck pain when she woke up with a crick-like pain in her neck. Also complicated by occurring immediately after urinating. Also complicated by been on multiple meds for blood pressure #2 recent elevation of blood pressure. Plan very long discussion held. #1 in problem #2 laying against each other with problem #1 temporarily low blood pressure too low, and problem #2 sustained elevated. For now will maintain blood pressure medications at same level. Cardiology referral due to syncopal event. Rationale discussed.

## 2016-02-24 NOTE — Telephone Encounter (Signed)
Patient having BP problems.  States it has been running high for the last few days.  Anywhere from 150/90 to 181/90. Was feeling bad last night so called EMS and they said BP was normal so did not transport to ER wants to get in today but only same day appts left.  Please call pt with advice at [phone]. (BP usually comes down after she takes her medicine.)

## 2016-02-24 NOTE — Telephone Encounter (Signed)
Call pt ive her appt today

## 2016-02-27 ENCOUNTER — Encounter: Payer: Self-pay | Admitting: Family Medicine

## 2016-03-21 ENCOUNTER — Ambulatory Visit: Payer: Medicare Other | Admitting: Cardiology

## 2016-03-26 DIAGNOSIS — M1711 Unilateral primary osteoarthritis, right knee: Secondary | ICD-10-CM | POA: Diagnosis not present

## 2016-03-30 ENCOUNTER — Encounter (INDEPENDENT_AMBULATORY_CARE_PROVIDER_SITE_OTHER): Payer: Self-pay

## 2016-03-30 ENCOUNTER — Encounter: Payer: Self-pay | Admitting: Gastroenterology

## 2016-03-30 ENCOUNTER — Ambulatory Visit (INDEPENDENT_AMBULATORY_CARE_PROVIDER_SITE_OTHER): Payer: Medicare Other | Admitting: Gastroenterology

## 2016-03-30 VITALS — BP 150/78 | HR 72 | Ht 60.0 in | Wt 122.1 lb

## 2016-03-30 DIAGNOSIS — K582 Mixed irritable bowel syndrome: Secondary | ICD-10-CM

## 2016-03-30 DIAGNOSIS — Z1211 Encounter for screening for malignant neoplasm of colon: Secondary | ICD-10-CM | POA: Diagnosis not present

## 2016-03-30 NOTE — Progress Notes (Signed)
HPI :  76 y/o female with a history of hypertension , diverticulosis , IBS , here for follow up for IBS. Previously seen by Dr. Olevia Perches, last in 01/2013.   She had been on metamucil and flax seed for her symptoms over time, and increased water intake. Compared to how she was prior to this intervention, she has had significant improvement. She has not had a follow up since her last colonoscopy in 2014. She reported prior to starting this regimen she would have intermittent loose stools and constipation, as well as difficulty controlling bowel movements. She had some gas and bloating previously along with cramping.   On her present regimen she is having regular bowel movements, most days she will have a bowel movement, formed and soft. She previously had rectal bleeding in the setting of straining which resolved after starting metamucil. She tolerates the metamucil without gas or bloating. She's had negative hemeoccult in 2017 and 2016.  No FH of colon cancer.   Colonoscopy 2014 - mild diverticulosis without adenomas. No evidence of microscopic colitis.   Past Medical History:  Diagnosis Date  . Arthritis   . DDD (degenerative disc disease)   . Diverticulosis   . Dysrhythmia    palpatations  . HOH (hard of hearing)    bilateral hearing aids  . Hypertension   . IBS (irritable bowel syndrome)   . Tachyarrhythmia      Past Surgical History:  Procedure Laterality Date  . ABDOMINAL HYSTERECTOMY    . ADENOIDECTOMY    . ANTERIOR AND POSTERIOR REPAIR    . APPENDECTOMY    . BREAST BIOPSY    . CATARACT EXTRACTION W/PHACO  06/25/2011   Procedure: CATARACT EXTRACTION PHACO AND INTRAOCULAR LENS PLACEMENT (IOC);  Surgeon: Williams Che, MD;  Location: [facility];  Service: Ophthalmology;  Laterality: Right;  CDE: 8.90  . CATARACT EXTRACTION W/PHACO Left 05/05/2012   Procedure: CATARACT EXTRACTION PHACO AND INTRAOCULAR LENS PLACEMENT (IOC);  Surgeon: Williams Che, MD;  Location: [facility];   Service: Ophthalmology;  Laterality: Left;  CDE 12.78  . EXCISION MORTON'S NEUROMA Right   . right ACL    . right knee arthroscopy    . TONSILLECTOMY     Family History  Problem Relation Age of Onset  . Hypertension Mother   . CVA Mother   . Throat cancer Father   . Pseudochol deficiency Neg Hx   . Malignant hyperthermia Neg Hx   . Hypotension Neg Hx   . Anesthesia problems Neg Hx    Social History  Substance Use Topics  . Smoking status: Former Smoker    Packs/day: 0.50    Years: 10.00    Types: Cigarettes    Quit date: 04/29/1961  . Smokeless tobacco: Never Used  . Alcohol use Yes     Comment: socially   Current Outpatient Prescriptions  Medication Sig Dispense Refill  . estradiol (ESTRACE) 0.1 MG/GM vaginal cream Place 2 g vaginally once a week.    . Flaxseed, Linseed, (FLAX PO) Take by mouth daily.    Marland Kitchen ibuprofen (ADVIL,MOTRIN) 200 MG tablet Take 200 mg by mouth as needed. Reported on 08/16/2015    . losartan (COZAAR) 50 MG tablet Take 1 tablet (50 mg total) by mouth every morning. 90 tablet 0  . metoprolol (LOPRESSOR) 50 MG tablet TAKE (1) TABLET TWICE DAILY. 180 tablet 1  . Multiple Vitamins-Minerals (CENTRUM SILVER) tablet Take 1 tablet by mouth daily.    . OMEGA 3 1000 MG  CAPS Take 1 capsule by mouth at bedtime.     . Probiotic Product (PROBIOTIC DAILY PO) Take 1 tablet by mouth daily.    . Psyllium (METAMUCIL PO) Take by mouth daily.     No current facility-administered medications for this visit.    Allergies  Allergen Reactions  . Codeine     REACTION: syncope  . Hydrocodone Nausea Only    Dizzy  . Penicillins Rash     Review of Systems: All systems reviewed and negative except where noted in HPI.   Lab Results  Component Value Date   HGB 14.4 04/29/2012   HCT 41.7 04/29/2012    Lab Results  Component Value Date   CREATININE 0.67 12/20/2015   BUN 12 12/20/2015   NA 140 12/20/2015   K 4.9 12/20/2015   CL 97 12/20/2015   CO2 26 12/20/2015    Lab Results  Component Value Date   ALT 25 12/20/2015   AST 23 12/20/2015   ALKPHOS 57 12/20/2015   BILITOT 0.6 12/20/2015     Physical Exam: BP (!) 150/78 (BP Location: Left Arm, Patient Position: Sitting, Cuff Size: Normal)   Pulse 72   Ht 5' (1.524 m) Comment: height measured without shoes  Wt 122 lb 2 oz (55.4 kg)   BMI 23.85 kg/m  Constitutional: Pleasant,well-developed, female in no acute distress. HEENT: Normocephalic and atraumatic. Conjunctivae are normal. No scleral icterus. Neck supple.  Cardiovascular: Normal rate, regular rhythm.  Pulmonary/chest: Effort normal and breath sounds normal. No wheezing, rales or rhonchi. Abdominal: Soft, nondistended, nontender.There are no masses palpable. No hepatomegaly. Extremities: no edema Lymphadenopathy: No cervical adenopathy noted. Neurological: Alert and oriented to person place and time. Skin: Skin is warm and dry. No rashes noted. Psychiatric: Normal mood and affect. Behavior is normal.   ASSESSMENT AND PLAN: 76 year old female here to establish care for IBS, we also discussed colon cancer screening.  IBS - mixed formed IBS historically, she is responded very well to fiber supplementation which has regularized her bowel habits. She has no symptoms at this time that bother her. I discussed IBS with her in general and treatment options moving forward. She can continue her fiber supplementation in contact me in the interim if she has any questions or concerns.   Colon cancer screening - her last colonoscopy was in 2014 which was normal without polyps. I counseled her that she does not need any further stool testing given this result, and that this colonoscopy is valid for 10 years. Further I don't feel she warrants any further colon cancer screening in general, given she has no history of adenomatous polyps, her next exam would not be due until she is 76 years old, at which point the risks of screening likely outweigh the  benefits. She verbalized understanding and agreed.  [place] Cellar, MD [facility] Pager [phone]  CC: Mikey Kirschner, MD

## 2016-03-30 NOTE — Patient Instructions (Signed)
No changes per Dr Havery Moros.

## 2016-04-05 ENCOUNTER — Other Ambulatory Visit: Payer: Self-pay | Admitting: Family Medicine

## 2016-04-09 ENCOUNTER — Encounter: Payer: Self-pay | Admitting: Cardiology

## 2016-04-09 ENCOUNTER — Ambulatory Visit (INDEPENDENT_AMBULATORY_CARE_PROVIDER_SITE_OTHER): Payer: Medicare Other | Admitting: Cardiology

## 2016-04-09 VITALS — BP 142/80 | HR 65 | Ht 61.0 in | Wt 120.0 lb

## 2016-04-09 DIAGNOSIS — R002 Palpitations: Secondary | ICD-10-CM

## 2016-04-09 DIAGNOSIS — Z823 Family history of stroke: Secondary | ICD-10-CM

## 2016-04-09 DIAGNOSIS — I10 Essential (primary) hypertension: Secondary | ICD-10-CM | POA: Diagnosis not present

## 2016-04-09 DIAGNOSIS — Z87898 Personal history of other specified conditions: Secondary | ICD-10-CM

## 2016-04-09 NOTE — Patient Instructions (Signed)
Your physician wants you to follow-up in: 1 year with Dr Ferne Reus will receive a reminder letter in the mail two months in advance. If you don't receive a letter, please call our office to schedule the follow-up appointment.   Your physician has requested that you have an echocardiogram. Echocardiography is a painless test that uses sound waves to create images of your heart. It provides your doctor with information about the size and shape of your heart and how well your heart's chambers and valves are working. This procedure takes approximately one hour. There are no restrictions for this procedure.    Your physician has requested that you have a carotid duplex. This test is an ultrasound of the carotid arteries in your neck. It looks at blood flow through these arteries that supply the brain with blood. Allow one hour for this exam. There are no restrictions or special instructions.   Your physician recommends that you continue on your current medications as directed. Please refer to the Current Medication list given to you today.    If you need a refill on your cardiac medications before your next appointment, please call your pharmacy.    Thank you for choosing [facility] !

## 2016-04-09 NOTE — Progress Notes (Signed)
Cardiology Office Note  Date: 04/09/2016   ID: ARALEE OKUBO, DOB 1940/06/10, MRN [id number]  PCP: Mickie Hillier, MD  Consulting Cardiologist: Rozann Lesches, MD   Chief Complaint  Patient presents with  . History of syncope    History of Present Illness: [name] is a 76 y.o. female referred for cardiology consultation by Dr. Wolfgang Phoenix. She is referred for the evaluation of an apparent episode of vasovagal syncope based on review of Dr. Lance Sell office note in January. We discussed her symptoms today. She states that she has been feeling fine, no recent palpitations or dizziness. She had fallen asleep in a chair with her neck turned, woke up with a lot of pain and a "crick" in her neck. She got up to go to the bathroom, while she was urinating she passed out. She states that she has had syncope in the past after a very painful injection. Otherwise she reports no exertional shortness of breath, no significant palpitations on beta blocker, no prior history of cardiac arrhythmia. No sudden unexplained syncope with activity.  She states that her mother had a history of poorly controlled blood pressure and stroke, and that they are very similar in general. She has been compliant with her medications including Lopressor and Cozaar.  I personally reviewed her ECG from today which shows sinus rhythm with nonspecific T-wave changes.  She does not report any history of cardiovascular disease. Palpitations were worked up approximately 10 years ago without obvious arrhythmia. No known atrial fibrillation.  Past Medical History:  Diagnosis Date  . Arthritis   . DDD (degenerative disc disease)   . Diverticulosis   . Essential hypertension   . HOH (hard of hearing)    Bilateral hearing aids  . IBS (irritable bowel syndrome)   . Palpitations     Past Surgical History:  Procedure Laterality Date  . ABDOMINAL HYSTERECTOMY    . ADENOIDECTOMY    . ANTERIOR AND POSTERIOR REPAIR    .  APPENDECTOMY    . BREAST BIOPSY    . CATARACT EXTRACTION W/PHACO  06/25/2011   Procedure: CATARACT EXTRACTION PHACO AND INTRAOCULAR LENS PLACEMENT (IOC);  Surgeon: Williams Che, MD;  Location: [facility];  Service: Ophthalmology;  Laterality: Right;  CDE: 8.90  . CATARACT EXTRACTION W/PHACO Left 05/05/2012   Procedure: CATARACT EXTRACTION PHACO AND INTRAOCULAR LENS PLACEMENT (IOC);  Surgeon: Williams Che, MD;  Location: [facility];  Service: Ophthalmology;  Laterality: Left;  CDE 12.78  . EXCISION MORTON'S NEUROMA Right   . Right ACL    . Right knee arthroscopy    . TONSILLECTOMY      Current Outpatient Prescriptions  Medication Sig Dispense Refill  . estradiol (ESTRACE) 0.1 MG/GM vaginal cream Place 2 g vaginally once a week.    . Flaxseed, Linseed, (FLAX PO) Take by mouth daily.    Marland Kitchen ibuprofen (ADVIL,MOTRIN) 200 MG tablet Take 200 mg by mouth as needed. Reported on 08/16/2015    . losartan (COZAAR) 50 MG tablet TAKE 1 TABLET BY MOUTH EVERY MORNING. 90 tablet 0  . metoprolol (LOPRESSOR) 50 MG tablet TAKE (1) TABLET TWICE DAILY. 180 tablet 1  . Multiple Vitamins-Minerals (CENTRUM SILVER) tablet Take 1 tablet by mouth daily.    . OMEGA 3 1000 MG CAPS Take 1 capsule by mouth at bedtime.     . Probiotic Product (PROBIOTIC DAILY PO) Take 1 tablet by mouth daily.    . Psyllium (METAMUCIL PO) Take by mouth daily.  No current facility-administered medications for this visit.    Allergies:  Codeine; Hydrocodone; and Penicillins   Social History: The patient  reports that she quit smoking about 54 years ago. Her smoking use included Cigarettes. She has a 5.00 pack-year smoking history. She has never used smokeless tobacco. She reports that she drinks alcohol. She reports that she does not use drugs.   Family History: The patient's family history includes CVA in her mother; Hypertension in her mother; Throat cancer in her father.   ROS:  Please see the history of present illness. Otherwise,  complete review of systems is positive for none.  All other systems are reviewed and negative.   Physical Exam: VS:  BP (!) 142/80 (BP Location: Right Arm)   Pulse 65   Ht 5\' 1"  (1.549 m)   Wt 120 lb (54.4 kg)   SpO2 99%   BMI 22.67 kg/m , BMI Body mass index is 22.67 kg/m.  Wt Readings from Last 3 Encounters:  04/09/16 120 lb (54.4 kg)  03/30/16 122 lb 2 oz (55.4 kg)  02/24/16 121 lb (54.9 kg)    General: Thin elderly woman, appears comfortable at rest. HEENT: Conjunctiva and lids normal, oropharynx clear with moist mucosa. Neck: Supple, no elevated JVP or carotid bruits, no thyromegaly. Lungs: Clear to auscultation, nonlabored breathing at rest. Cardiac: Regular rate and rhythm, no S3 or significant systolic murmur, no pericardial rub. Abdomen: Soft, nontender, bowel sounds present, no guarding or rebound. Extremities: No pitting edema, distal pulses 2+. Skin: Warm and dry. Musculoskeletal: No kyphosis. Neuropsychiatric: Alert and oriented x3, affect grossly appropriate.  ECG: I personally reviewed the tracing from 02/15/2014 which showed normal sinus rhythm with nonspecific ST changes.  Recent Labwork: 12/20/2015: ALT 25; AST 23; BUN 12; Creatinine, Ser 0.67; Potassium 4.9; Sodium 140     Component Value Date/Time   CHOL 214 (H) 12/20/2015 0825   TRIG 62 12/20/2015 0825   HDL 102 12/20/2015 0825   CHOLHDL 2.1 12/20/2015 0825   CHOLHDL 2.8 01/22/2013 0803   VLDL 13 01/22/2013 0803   LDLCALC 100 (H) 12/20/2015 0825   Assessment and Plan:  1. History of syncope. Based on description, I agree with Dr. Lindi Adie assessment that this was most likely a vasovagal event precipitated by severe pain. She had a similar episode years ago. She does have a history of palpitations but states that this is been well controlled on beta blocker, and she did not have any palpitations at the time that she had her syncopal event. Plan is to obtain an echocardiogram to ensure no major cardiac  structural abnormalities. Otherwise would continue observation.  2. History of palpitations with no documented arrhythmia over time. She has done very well on metoprolol. No changes made today.  3. Essential hypertension. Patient on Cozaar and metoprolol. States that her mother had a history of stroke and that they are very similar individuals. No history of carotid artery disease. I do not think that her syncope represented a neurological event. We will obtain screening carotid Dopplers. May be worth taking a baby aspirin a day if she has any evidence of even mild atherosclerosis.  4. Last lipid panel showed LDL 100 and HDL 102. She exercises regularly and watches her diet.  Current medicines were reviewed with the patient today.   Orders Placed This Encounter  Procedures  . US Carotid Duplex Bilateral  . EKG 12-Lead  . ECHOCARDIOGRAM COMPLETE    Disposition: Call with test results. Follow-up in one year  otherwise.  Signed, Satira Sark, MD, [facility] 04/09/2016 11:01 AM     [facility] [address] [place], [place] [address] Phone: [phone]; Fax: [phone]

## 2016-04-16 ENCOUNTER — Other Ambulatory Visit (HOSPITAL_COMMUNITY): Payer: Medicare Other

## 2016-04-17 ENCOUNTER — Ambulatory Visit (HOSPITAL_COMMUNITY)
Admission: RE | Admit: 2016-04-17 | Discharge: 2016-04-17 | Disposition: A | Discharge status: Home / Self Care | Payer: Medicare Other | Source: Ambulatory Visit | Attending: Cardiology | Admitting: Cardiology

## 2016-04-17 DIAGNOSIS — I071 Rheumatic tricuspid insufficiency: Secondary | ICD-10-CM | POA: Insufficient documentation

## 2016-04-17 DIAGNOSIS — I6523 Occlusion and stenosis of bilateral carotid arteries: Secondary | ICD-10-CM | POA: Insufficient documentation

## 2016-04-17 DIAGNOSIS — Z87898 Personal history of other specified conditions: Secondary | ICD-10-CM

## 2016-04-17 DIAGNOSIS — R55 Syncope and collapse: Secondary | ICD-10-CM | POA: Insufficient documentation

## 2016-04-17 DIAGNOSIS — E041 Nontoxic single thyroid nodule: Secondary | ICD-10-CM | POA: Insufficient documentation

## 2016-04-17 LAB — ECHOCARDIOGRAM COMPLETE
CHL CUP DOP CALC LVOT VTI: 21.8 cm
CHL CUP MV DEC (S): 239
CHL CUP TV REG PEAK VELOCITY: 266 cm/s
E decel time: 239 msec
E/e' ratio: 10.72
FS: 34 % (ref 28–44)
IVS/LV PW RATIO, ED: 1.02
LA diam end sys: 36 mm
LA diam index: 2.34 cm/m2
LA vol: 38.5 mL
LASIZE: 36 mm
LAVOLA4C: 34 mL
LAVOLIN: 25 mL/m2
LDCA: 2.27 cm2
LV PW d: 8.65 mm — AB (ref 0.6–1.1)
LV e' LATERAL: 6.85 cm/s
LV sys vol index: 13 mL/m2
LV sys vol: 20 mL (ref 14–42)
LVDIAVOL: 54 mL (ref 46–106)
LVDIAVOLIN: 35 mL/m2
LVEEAVG: 10.72
LVEEMED: 10.72
LVOT diameter: 17 mm
LVOT peak grad rest: 4 mmHg
LVOT peak vel: 94 cm/s
LVOTSV: 49 mL
Lateral S' vel: 10.3 cm/s
MV pk A vel: 71 m/s
MV pk E vel: 73.4 m/s
MVPG: 2 mmHg
RV sys press: 31 mmHg
Simpson's disk: 64
Stroke v: 35 ml
TAPSE: 23.6 mm
TDI e' lateral: 6.85
TDI e' medial: 7.07
TRMAXVEL: 266 cm/s

## 2016-04-17 NOTE — Progress Notes (Signed)
*  PRELIMINARY RESULTS* Echocardiogram 2D Echocardiogram has been performed.  [name] 04/17/2016, 10:49 AM

## 2016-04-23 ENCOUNTER — Ambulatory Visit (INDEPENDENT_AMBULATORY_CARE_PROVIDER_SITE_OTHER): Payer: Medicare Other | Admitting: Family Medicine

## 2016-04-23 ENCOUNTER — Encounter: Payer: Self-pay | Admitting: Family Medicine

## 2016-04-23 VITALS — BP 122/86 | Ht 61.0 in | Wt 123.0 lb

## 2016-04-23 DIAGNOSIS — E041 Nontoxic single thyroid nodule: Secondary | ICD-10-CM | POA: Diagnosis not present

## 2016-04-23 MED ORDER — TOBRAMYCIN-DEXAMETHASONE 0.3-0.1 % OP SUSP: 1.0000 [drp] | Freq: Four times a day (QID) | OPHTHALMIC | 1 refills | Status: DC | PRN

## 2016-04-23 NOTE — Progress Notes (Signed)
Subjective  Patient arrives office for evaluation. Please see prior notes. Patient had a syncopal event. We went along with the cardiology referral, though I felt likelihood of heart disease very low.    Complete imaging tests and cardiology order tests and cardiology note reviewed today in presence of patient. She is reassured now as far as heart disease.  Unfortunately her carotid workup which cardiologist felt was a good idea revealed presence of thyroid nodule solitary.  No symptoms of high or low thyroid.  Patient gives a history of remote thyroid nodules many many years ago. Never had to have a biopsy next  Reports no neck pain or throat pain.  No headache, no major weight loss or weight gain, no chest pain no back pain abdominal pain no change in bowel habits complete ROS otherwise negative   Physical exam  Alert and oriented, vitals reviewed and stable, NAD ENT-TM's and ext canals no obvious palpable thyroid nodule. WNL bilat via otoscopic exam Soft palate, tonsils and post pharynx WNL via oropharyngeal exam Neck-symmetric, no masses; thyroid nonpalpable and nontender Pulmonary-no tachypnea or accessory muscle use; Clear without wheezes via auscultation Card--no abnrml murmurs, rhythm reg and rate WNL Carotid pulses symmetric, without bruits   Impression incidentaloma discovered with solitary thyroid nodule. Pros and cons of further evaluation discussed. Many questions answered. Patient like to press on. Ultrasound ordered. Further recommendations based on results.  Greater than 50% of this 25 minute face to face visit was spent in counseling and discussion and coordination of care regarding the above diagnosis/diagnosies

## 2016-04-27 ENCOUNTER — Ambulatory Visit (HOSPITAL_COMMUNITY): Payer: Medicare Other

## 2016-05-01 ENCOUNTER — Ambulatory Visit (HOSPITAL_COMMUNITY)
Admission: RE | Admit: 2016-05-01 | Discharge: 2016-05-01 | Disposition: A | Discharge status: Home / Self Care | Payer: Medicare Other | Source: Ambulatory Visit | Attending: Family Medicine | Admitting: Family Medicine

## 2016-05-01 DIAGNOSIS — E042 Nontoxic multinodular goiter: Secondary | ICD-10-CM | POA: Diagnosis not present

## 2016-05-01 DIAGNOSIS — E041 Nontoxic single thyroid nodule: Secondary | ICD-10-CM | POA: Diagnosis present

## 2016-05-04 ENCOUNTER — Ambulatory Visit (HOSPITAL_COMMUNITY): Payer: Medicare Other

## 2016-06-08 ENCOUNTER — Ambulatory Visit (INDEPENDENT_AMBULATORY_CARE_PROVIDER_SITE_OTHER): Payer: Medicare Other | Admitting: Family Medicine

## 2016-06-08 ENCOUNTER — Encounter: Payer: Self-pay | Admitting: Family Medicine

## 2016-06-08 VITALS — BP 130/82 | Ht 61.0 in | Wt 121.0 lb

## 2016-06-08 DIAGNOSIS — I10 Essential (primary) hypertension: Secondary | ICD-10-CM

## 2016-06-08 MED ORDER — LOSARTAN POTASSIUM 50 MG PO TABS: 50.0000 mg | ORAL_TABLET | Freq: Every morning | ORAL | 1 refills | Status: DC

## 2016-06-08 MED ORDER — METOPROLOL TARTRATE 50 MG PO TABS: ORAL_TABLET | ORAL | 1 refills | Status: DC

## 2016-06-08 NOTE — Progress Notes (Signed)
   Subjective:    Patient ID: [name], female    DOB: [birth year], 76 y.o.   MRN: [id number]  Hyperlipidemia  This is a chronic problem. Treatments tried: omega 3. There are no compliance problems.  Risk factors for coronary artery disease include dyslipidemia, hypertension and post-menopausal.   Patient also following up on blood pressure  BP good overall  .exrcise mostly good, yoga once per wk  Staying active doing a lot f steps these days     Blood pressure medicine and blood pressure levels reviewed today with patient. Compliant with blood pressure medicine. States does not miss a dose. No obvious side effects. Blood pressure generally good when checked elsewhere. Watching salt intake.   Review of Systems No headache, no major weight loss or weight gain, no chest pain no back pain abdominal pain no change in bowel habits complete ROS otherwise negative     Objective:   Physical Exam Alert vitals stable, NAD. Blood pressure good on repeat. HEENT normal. Lungs clear. Heart regular rate and rhythm.        Assessment & Plan:  Impression hypertension good control discussed maintain same meds. Diet exercise discussed. Recheck in 6 months. For wellness plus chronic. 1.9 or 10 months will need thyroid ultrasound follow-up

## 2016-08-06 DIAGNOSIS — S80211A Abrasion, right knee, initial encounter: Secondary | ICD-10-CM | POA: Diagnosis not present

## 2016-08-06 DIAGNOSIS — S80212A Abrasion, left knee, initial encounter: Secondary | ICD-10-CM | POA: Diagnosis not present

## 2016-08-09 DIAGNOSIS — I10 Essential (primary) hypertension: Secondary | ICD-10-CM | POA: Diagnosis not present

## 2016-08-09 DIAGNOSIS — M795 Residual foreign body in soft tissue: Secondary | ICD-10-CM | POA: Diagnosis not present

## 2016-08-09 DIAGNOSIS — L02416 Cutaneous abscess of left lower limb: Secondary | ICD-10-CM | POA: Diagnosis not present

## 2016-08-09 DIAGNOSIS — S8992XA Unspecified injury of left lower leg, initial encounter: Secondary | ICD-10-CM | POA: Diagnosis not present

## 2016-08-13 ENCOUNTER — Ambulatory Visit (INDEPENDENT_AMBULATORY_CARE_PROVIDER_SITE_OTHER): Payer: Medicare Other | Admitting: Family Medicine

## 2016-08-13 ENCOUNTER — Encounter: Payer: Self-pay | Admitting: Family Medicine

## 2016-08-13 VITALS — BP 170/90 | Temp 98.3°F | Ht 61.0 in | Wt 121.0 lb

## 2016-08-13 DIAGNOSIS — L03116 Cellulitis of left lower limb: Secondary | ICD-10-CM | POA: Diagnosis not present

## 2016-08-13 MED ORDER — SULFAMETHOXAZOLE-TRIMETHOPRIM 800-160 MG PO TABS: 1.0000 | ORAL_TABLET | Freq: Two times a day (BID) | ORAL | 0 refills | Status: DC

## 2016-08-13 NOTE — Progress Notes (Signed)
   Subjective:    Patient ID: [name], female    DOB: August 22, 1940, 76 y.o.   MRN: [id number]  Wound Check   [place] the beach and has a wound on left knee. Was [place] beach and they prescribed Cephalexin 500 mg 1 Q 6 hours for 10 days, also sulfamethoxazole 1 twice daily for 10 days. Wants Dr. Wolfgang Phoenix to make sure it is healing well.  Took a fall at the beach ad injured leg, tripped and fell and struck hand s and knees  Had a chuk of wood tuck in the knee  Pulled it out  Tried to cean very well  Self dressed and rx'ed    Pos inflammed and tender  No other concerns.  Review of Systems No headache, no major weight loss or weight gain, no chest pain no back pain abdominal pain no change in bowel habits complete ROS otherwise negative     Objective:   Physical Exam  Alert vitals stable, NAD. Blood pressure good on repeat. HEENT normal. Lungs clear. Heart regular rate and rhythm. 2 puncture wounds with ulceration left anterior knee positive prepatellar erythema and tenderness      Assessment & Plan:  Puncture wound with secondary cellulitis seen in urgent care then ER. Large splinter removed. Expect slow healing rationale discussed. Wound care discussed additional Bactrim prescription

## 2016-08-14 ENCOUNTER — Other Ambulatory Visit: Payer: Self-pay | Admitting: *Deleted

## 2016-08-14 ENCOUNTER — Telehealth: Payer: Self-pay | Admitting: Family Medicine

## 2016-08-14 ENCOUNTER — Other Ambulatory Visit: Payer: Self-pay

## 2016-08-14 DIAGNOSIS — S80259A Superficial foreign body, unspecified knee, initial encounter: Secondary | ICD-10-CM

## 2016-08-14 DIAGNOSIS — S80252S Superficial foreign body, left knee, sequela: Secondary | ICD-10-CM

## 2016-08-14 NOTE — Telephone Encounter (Signed)
Called pt she states pain in knee woke her at 3:30 this am. There is a small hole with drainage coming out. She states she could see something dark in the hole but was unable to pull it out. Consult with dr Richardson Landry. Set up appt with surgeon. Called [facility] [place] [facility] and they state to fax notes, insurance card and demographics. Everything faxed to [facility]. Pt notifed they will contact her for appt.

## 2016-08-14 NOTE — Telephone Encounter (Signed)
Patient was seen yesterday for a puncture wound in her knee by Dr. Richardson Landry.  She said she thinks there is still something in her knee.  It is still sore and pus coming out and she wants to know what Dr. Richardson Landry recommends.  She is requesting a call back ASAP.

## 2016-08-15 ENCOUNTER — Ambulatory Visit (INDEPENDENT_AMBULATORY_CARE_PROVIDER_SITE_OTHER): Payer: Medicare Other | Admitting: Family Medicine

## 2016-08-15 ENCOUNTER — Encounter: Payer: Self-pay | Admitting: Family Medicine

## 2016-08-15 VITALS — BP 136/80 | Ht 61.0 in | Wt 121.0 lb

## 2016-08-15 DIAGNOSIS — S80852A Superficial foreign body, left lower leg, initial encounter: Secondary | ICD-10-CM

## 2016-08-15 DIAGNOSIS — M1711 Unilateral primary osteoarthritis, right knee: Secondary | ICD-10-CM | POA: Diagnosis not present

## 2016-08-15 NOTE — Progress Notes (Signed)
   Subjective:    Patient ID: [name], female    DOB: 09-Mar-1940, 76 y.o.   MRN: [id number]  HPI: Patient is back today following up on her knee injury. She thinks there may be a piece of debris in the wound and would like [place] to look at it again.    Review of Systems No headache, no major weight loss or weight gain, no chest pain no back pain abdominal pain no change in bowel habits complete ROS otherwise negative     Objective:   Physical Exam Alert vitals stable, NAD. Blood pressure good on repeat. HEENT normal. Lungs clear. Heart regular rate and rhythm.  Left knee distinctly inflamed patient was anesthetized. Trait. Sterilize. Incision was made. A large piece of wood was removed. Exploration of when revealed another fragment of wood. Wound care discussed with patient maintain antibiotics      Assessment & Plan:

## 2016-08-20 ENCOUNTER — Telehealth: Payer: Self-pay | Admitting: *Deleted

## 2016-08-20 DIAGNOSIS — S81009A Unspecified open wound, unspecified knee, initial encounter: Secondary | ICD-10-CM

## 2016-08-20 NOTE — Telephone Encounter (Signed)
DR Richardson Landry to see

## 2016-08-20 NOTE — Telephone Encounter (Signed)
Patient is aware she was re referred and to expect a call from our office or Dr. Arnoldo Morale office for the date and time. Informed that if wound gets worse in the mean time to call our office for further instructions.

## 2016-08-20 NOTE — Telephone Encounter (Signed)
Pt called today still stating the injury to her knee is seeping pus, and she is aware that you told her to be patient with it that it would take some time to heal. She still has one more week of antibx to take.She would like to go ahead with the referral to Dr. Julio Alm it does not get any better. She had had an appt with Dr. Doreene Burke she cancelled it because she came in and you took out the debris from her wound. Please advise, Thanks.

## 2016-08-20 NOTE — Telephone Encounter (Signed)
Patient called requesting to speak to [name] only regarding this problem with the injection in her leg, per patient [name] knows more about it and she don't want to have to start over explaining the problem. Please advise

## 2016-08-20 NOTE — Telephone Encounter (Signed)
Referral to [facility] for wound

## 2016-08-22 ENCOUNTER — Telehealth: Payer: Self-pay | Admitting: Family Medicine

## 2016-08-22 DIAGNOSIS — M1711 Unilateral primary osteoarthritis, right knee: Secondary | ICD-10-CM | POA: Diagnosis not present

## 2016-08-22 MED ORDER — CEFPROZIL 500 MG PO TABS: 500.0000 mg | ORAL_TABLET | Freq: Two times a day (BID) | ORAL | 0 refills | Status: DC

## 2016-08-22 NOTE — Telephone Encounter (Signed)
Per Dr Richardson Landry: [name] 500mg  #14 one twice a day for 7 days Prescription sent electronically to pharmacy. Patient notified.

## 2016-08-22 NOTE — Telephone Encounter (Signed)
Pt called wanting to speak to a nurse regarding a possible uti that she has. Pt has these regularly and states that she is currently on an antibiotic for a knee infection that she seen Dr. Richardson Landry for. Pt had some left over antibiotics that she had from a previous uti that she had that she took last night as well. Please advise.

## 2016-08-22 NOTE — Telephone Encounter (Signed)
Patient states she feels strongly it is a UTI that is resistant to the other meds she was on and thinks she needs Cefzil to clear up the UTI

## 2016-08-24 ENCOUNTER — Telehealth: Payer: Self-pay | Admitting: Family Medicine

## 2016-08-24 NOTE — Telephone Encounter (Signed)
Spoke with patient about referral for knee wound States she was being see by her ortho, Dr. Theda Sers, for an injection in opposite knee He examined the wounded knee & set her up for surgical intervention for 08/28/16 Pt wanted Dr. Richardson Landry to know that this is being taken care of  (Dr. Arnoldo Morale won't see wounds to a joint & [facility] was a month out with appt offer)

## 2016-08-24 NOTE — Telephone Encounter (Signed)
Pt is requesting a call back from a nurse. Pt states that she has some information that she needs passed on the the Dr.

## 2016-08-24 NOTE — Telephone Encounter (Signed)
noted 

## 2016-08-27 ENCOUNTER — Encounter (HOSPITAL_BASED_OUTPATIENT_CLINIC_OR_DEPARTMENT_OTHER): Payer: Self-pay | Admitting: *Deleted

## 2016-08-27 ENCOUNTER — Ambulatory Visit: Payer: Self-pay | Admitting: Orthopedic Surgery

## 2016-08-27 NOTE — Progress Notes (Signed)
NPO AFTER MN  W/ CLEAR LIQUIDS UNTIL 0930 (NO CREAM /MILK PRODUCTS).  ARRIVE AT 6859.  NEEDS ISTAT .   CURRENT EKG IN CHART AND EPIC.  WILL TAKE COZAAR AND LOPRESSOR AM DOS W/ SIP OF WATER.  PT AWARE OWER.

## 2016-08-30 ENCOUNTER — Ambulatory Visit (HOSPITAL_BASED_OUTPATIENT_CLINIC_OR_DEPARTMENT_OTHER)
Admission: RE | Admit: 2016-08-30 | Discharge: 2016-08-30 | Disposition: A | Discharge status: Home / Self Care | Payer: Medicare Other | Source: Ambulatory Visit | Attending: Specialist | Admitting: Specialist

## 2016-08-30 ENCOUNTER — Encounter (HOSPITAL_BASED_OUTPATIENT_CLINIC_OR_DEPARTMENT_OTHER): Payer: Self-pay

## 2016-08-30 ENCOUNTER — Ambulatory Visit (HOSPITAL_BASED_OUTPATIENT_CLINIC_OR_DEPARTMENT_OTHER): Payer: Medicare Other | Admitting: Anesthesiology

## 2016-08-30 ENCOUNTER — Encounter (HOSPITAL_BASED_OUTPATIENT_CLINIC_OR_DEPARTMENT_OTHER)
Admission: RE | Disposition: A | Discharge status: Home / Self Care | Payer: Self-pay | Source: Ambulatory Visit | Attending: Specialist

## 2016-08-30 DIAGNOSIS — Z9889 Other specified postprocedural states: Secondary | ICD-10-CM

## 2016-08-30 DIAGNOSIS — R194 Change in bowel habit: Secondary | ICD-10-CM | POA: Diagnosis not present

## 2016-08-30 DIAGNOSIS — M7042 Prepatellar bursitis, left knee: Secondary | ICD-10-CM | POA: Diagnosis not present

## 2016-08-30 DIAGNOSIS — Z87891 Personal history of nicotine dependence: Secondary | ICD-10-CM | POA: Diagnosis not present

## 2016-08-30 DIAGNOSIS — Z85828 Personal history of other malignant neoplasm of skin: Secondary | ICD-10-CM | POA: Insufficient documentation

## 2016-08-30 DIAGNOSIS — Z79899 Other long term (current) drug therapy: Secondary | ICD-10-CM | POA: Diagnosis not present

## 2016-08-30 DIAGNOSIS — I10 Essential (primary) hypertension: Secondary | ICD-10-CM | POA: Diagnosis not present

## 2016-08-30 DIAGNOSIS — M1711 Unilateral primary osteoarthritis, right knee: Secondary | ICD-10-CM | POA: Diagnosis not present

## 2016-08-30 DIAGNOSIS — R197 Diarrhea, unspecified: Secondary | ICD-10-CM | POA: Diagnosis not present

## 2016-08-30 DIAGNOSIS — M795 Residual foreign body in soft tissue: Secondary | ICD-10-CM | POA: Insufficient documentation

## 2016-08-30 DIAGNOSIS — Z79890 Hormone replacement therapy (postmenopausal): Secondary | ICD-10-CM | POA: Insufficient documentation

## 2016-08-30 DIAGNOSIS — S80852A Superficial foreign body, left lower leg, initial encounter: Secondary | ICD-10-CM | POA: Diagnosis not present

## 2016-08-30 DIAGNOSIS — M71162 Other infective bursitis, left knee: Secondary | ICD-10-CM | POA: Diagnosis not present

## 2016-08-30 HISTORY — DX: Personal history of other specified conditions: Z87.898

## 2016-08-30 HISTORY — DX: Nontoxic single thyroid nodule: E04.1

## 2016-08-30 HISTORY — DX: Personal history of urinary (tract) infections: Z87.440

## 2016-08-30 HISTORY — PX: INCISION AND DRAINAGE WOUND WITH FOREIGN BODY REMOVAL: SHX5635

## 2016-08-30 HISTORY — DX: Diverticulosis of large intestine without perforation or abscess without bleeding: K57.30

## 2016-08-30 HISTORY — DX: Unspecified open wound, left knee, initial encounter: S81.002A

## 2016-08-30 HISTORY — DX: Other specified postprocedural states: Z98.890

## 2016-08-30 HISTORY — DX: Other bursitis of knee, left knee: M70.52

## 2016-08-30 HISTORY — DX: Other specified postprocedural states: Z85.828

## 2016-08-30 HISTORY — DX: Presence of external hearing-aid: Z97.4

## 2016-08-30 HISTORY — DX: Personal history of other malignant neoplasm of skin: Z85.828

## 2016-08-30 LAB — POCT I-STAT 4, (NA,K, GLUC, HGB,HCT)
GLUCOSE: 90 mg/dL (ref 65–99)
HEMATOCRIT: 42 % (ref 36.0–46.0)
HEMOGLOBIN: 14.3 g/dL (ref 12.0–15.0)
POTASSIUM: 4.2 mmol/L (ref 3.5–5.1)
SODIUM: 133 mmol/L — AB (ref 135–145)

## 2016-08-30 SURGERY — INCISION AND DRAINAGE WOUND WITH FOREIGN BODY REMOVAL
Anesthesia: General | Site: Knee | Laterality: Left

## 2016-08-30 MED ORDER — PROPOFOL 10 MG/ML IV BOLUS
INTRAVENOUS | Status: AC
  Filled 2016-08-30: qty 20

## 2016-08-30 MED ORDER — DEXAMETHASONE SODIUM PHOSPHATE 4 MG/ML IJ SOLN
INTRAMUSCULAR | Status: DC | PRN
  Administered 2016-08-30: 10 mg via INTRAVENOUS

## 2016-08-30 MED ORDER — VANCOMYCIN HCL IN DEXTROSE 1-5 GM/200ML-% IV SOLN
1000.0000 mg | INTRAVENOUS | Status: AC
  Administered 2016-08-30: 1000 mg via INTRAVENOUS
  Filled 2016-08-30 (×2): qty 200

## 2016-08-30 MED ORDER — LACTATED RINGERS IV SOLN
INTRAVENOUS | Status: DC
  Administered 2016-08-30: 15:00:00 via INTRAVENOUS
  Filled 2016-08-30: qty 1000

## 2016-08-30 MED ORDER — LIDOCAINE 2% (20 MG/ML) 5 ML SYRINGE
INTRAMUSCULAR | Status: DC | PRN
Start: 2016-08-30 — End: 2016-08-30
  Administered 2016-08-30: 60 mg via INTRAVENOUS

## 2016-08-30 MED ORDER — FENTANYL CITRATE (PF) 100 MCG/2ML IJ SOLN
INTRAMUSCULAR | Status: AC
  Filled 2016-08-30: qty 2

## 2016-08-30 MED ORDER — BUPIVACAINE-EPINEPHRINE 0.5% -1:200000 IJ SOLN
INTRAMUSCULAR | Status: DC | PRN
  Administered 2016-08-30: 8 mL

## 2016-08-30 MED ORDER — PROPOFOL 10 MG/ML IV BOLUS
INTRAVENOUS | Status: DC | PRN
  Administered 2016-08-30: 140 mg via INTRAVENOUS

## 2016-08-30 MED ORDER — EPHEDRINE 5 MG/ML INJ
INTRAVENOUS | Status: AC
  Filled 2016-08-30: qty 10

## 2016-08-30 MED ORDER — LACTATED RINGERS IV SOLN
INTRAVENOUS | Status: DC
  Filled 2016-08-30: qty 1000

## 2016-08-30 MED ORDER — CEPHALEXIN 500 MG PO CAPS: 500.0000 mg | ORAL_CAPSULE | Freq: Three times a day (TID) | ORAL | 0 refills | Status: DC

## 2016-08-30 MED ORDER — HYDROCODONE-ACETAMINOPHEN 5-325 MG PO TABS: 1.0000 | ORAL_TABLET | Freq: Four times a day (QID) | ORAL | 0 refills | Status: DC | PRN

## 2016-08-30 MED ORDER — PROMETHAZINE HCL 25 MG/ML IJ SOLN
6.2500 mg | INTRAMUSCULAR | Status: DC | PRN
  Filled 2016-08-30: qty 1

## 2016-08-30 MED ORDER — CHLORHEXIDINE GLUCONATE 4 % EX LIQD
60.0000 mL | Freq: Once | CUTANEOUS | Status: DC
  Filled 2016-08-30: qty 118

## 2016-08-30 MED ORDER — LIDOCAINE 2% (20 MG/ML) 5 ML SYRINGE
INTRAMUSCULAR | Status: AC
  Filled 2016-08-30: qty 5

## 2016-08-30 MED ORDER — WHITE PETROLATUM GEL
Status: AC
  Filled 2016-08-30: qty 5

## 2016-08-30 MED ORDER — FENTANYL CITRATE (PF) 100 MCG/2ML IJ SOLN
INTRAMUSCULAR | Status: DC | PRN
  Administered 2016-08-30: 25 ug via INTRAVENOUS
  Administered 2016-08-30: 50 ug via INTRAVENOUS
  Administered 2016-08-30: 25 ug via INTRAVENOUS

## 2016-08-30 MED ORDER — FENTANYL CITRATE (PF) 100 MCG/2ML IJ SOLN
25.0000 ug | INTRAMUSCULAR | Status: DC | PRN
  Filled 2016-08-30: qty 1

## 2016-08-30 MED ORDER — ONDANSETRON HCL 4 MG PO TABS: 4.0000 mg | ORAL_TABLET | Freq: Three times a day (TID) | ORAL | 0 refills | Status: DC | PRN

## 2016-08-30 MED ORDER — ONDANSETRON HCL 4 MG/2ML IJ SOLN
INTRAMUSCULAR | Status: DC | PRN
  Administered 2016-08-30: 4 mg via INTRAVENOUS

## 2016-08-30 MED FILL — HYDROCODON-APAP 5-325: 5-325 | 7 days supply | Qty: 30 | Fill #0

## 2016-08-30 MED FILL — ONDANSETRON HCL 4 MG TABLET: 4 | 6 days supply | Qty: 20 | Fill #0

## 2016-08-30 MED FILL — CEPHALEXIN 500 MG CAPSULE: 500 | 7 days supply | Qty: 21 | Fill #0

## 2016-08-30 SURGICAL SUPPLY — 44 items
BANDAGE ESMARK 6X9 LF (GAUZE/BANDAGES/DRESSINGS) IMPLANT
BLADE 15 SAFETY STRL DISP (BLADE) ×2 IMPLANT
BNDG CMPR 9X6 STRL LF SNTH (GAUZE/BANDAGES/DRESSINGS) ×1
BNDG ESMARK 6X9 LF (GAUZE/BANDAGES/DRESSINGS) ×3
BNDG GAUZE ELAST 4 BULKY (GAUZE/BANDAGES/DRESSINGS) ×2 IMPLANT
CATH ROBINSON RED A/P 8FR (CATHETERS) ×2 IMPLANT
CLOSURE WOUND 1/2 X4 (GAUZE/BANDAGES/DRESSINGS) ×1
COVER BACK TABLE 80X110 HD (DRAPES) ×2 IMPLANT
CUFF TOURN SGL QUICK 24 (TOURNIQUET CUFF) ×3
CUFF TRNQT CYL 24X4X40X1 (TOURNIQUET CUFF) IMPLANT
DRAPE EXTREMITY T 121X128X90 (DRAPE) ×2 IMPLANT
DRAPE U-SHAPE 47X51 STRL (DRAPES) ×2 IMPLANT
DRSG PAD ABDOMINAL 8X10 ST (GAUZE/BANDAGES/DRESSINGS) ×4 IMPLANT
DURAPREP 26ML APPLICATOR (WOUND CARE) ×2 IMPLANT
ELECT REM PT RETURN 9FT ADLT (ELECTROSURGICAL) ×3
ELECTRODE REM PT RTRN 9FT ADLT (ELECTROSURGICAL) IMPLANT
GAUZE SPONGE 4X4 12PLY STRL (GAUZE/BANDAGES/DRESSINGS) ×2 IMPLANT
GAUZE SPONGE 4X4 12PLY STRL LF (GAUZE/BANDAGES/DRESSINGS) ×2 IMPLANT
GAUZE XEROFORM 1X8 LF (GAUZE/BANDAGES/DRESSINGS) ×2 IMPLANT
GLOVE BIO SURGEON STRL SZ7.5 (GLOVE) ×2 IMPLANT
GLOVE BIO SURGEON STRL SZ8 (GLOVE) ×2 IMPLANT
GLOVE BIOGEL PI IND STRL 7.5 (GLOVE) IMPLANT
GLOVE BIOGEL PI IND STRL 8 (GLOVE) IMPLANT
GLOVE BIOGEL PI INDICATOR 7.5 (GLOVE) ×6
GLOVE BIOGEL PI INDICATOR 8 (GLOVE) ×4
GOWN STRL REUS W/TWL LRG LVL3 (GOWN DISPOSABLE) ×4 IMPLANT
GOWN STRL REUS W/TWL XL LVL3 (GOWN DISPOSABLE) ×4 IMPLANT
IMMOBILIZER KNEE 22 UNIV (SOFTGOODS) ×2 IMPLANT
PACK BASIN DAY SURGERY FS (CUSTOM PROCEDURE TRAY) ×2 IMPLANT
PAD ABD 8X10 STRL (GAUZE/BANDAGES/DRESSINGS) ×2 IMPLANT
PENCIL BUTTON HOLSTER BLD 10FT (ELECTRODE) ×2 IMPLANT
STOCKINETTE IMPERVIOUS LG (DRAPES) ×2 IMPLANT
STRIP CLOSURE SKIN 1/2X4 (GAUZE/BANDAGES/DRESSINGS) ×1 IMPLANT
SUT ETHILON 4 0 PS 2 18 (SUTURE) ×2 IMPLANT
SUT MNCRL AB 4-0 PS2 18 (SUTURE) ×2 IMPLANT
SUT VIC AB 2-0 SH 27 (SUTURE) ×3
SUT VIC AB 2-0 SH 27XBRD (SUTURE) IMPLANT
SWAB COLLECTION DEVICE MRSA (MISCELLANEOUS) ×2 IMPLANT
SWAB CULTURE ESWAB REG 1ML (MISCELLANEOUS) ×2 IMPLANT
SYR CONTROL 10ML LL (SYRINGE) ×2 IMPLANT
TUBE CONNECTING 12'X1/4 (SUCTIONS) ×1
TUBE CONNECTING 12X1/4 (SUCTIONS) ×1 IMPLANT
TUBING SUCTION 1/4X6FT (MISCELLANEOUS) ×2 IMPLANT
YANKAUER SUCT BULB TIP NO VENT (SUCTIONS) ×2 IMPLANT

## 2016-08-30 NOTE — Interval H&P Note (Signed)
History and Physical Interval Note:  08/30/2016 4:30 PM  [name]  has presented today for surgery, with the diagnosis of left knee pre patellar bursa infection and foreign body  The various methods of treatment have been discussed with the patient and family. After consideration of risks, benefits and other options for treatment, the patient has consented to  Procedure(s): LEFT KNEE INCISION AND DRAINAGE WOUND WITH FOREIGN BODY REMOVAL (Left) as a surgical intervention .  The patient's history has been reviewed, patient examined, no change in status, stable for surgery.  I have reviewed the patient's chart and labs.  Questions were answered to the patient's satisfaction.     Thaddeaus Monica ANDREW

## 2016-08-30 NOTE — Op Note (Signed)
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 Debridement of necrotic skin with scar revision. Removal of multiple wood foreign bodies. Surgeon Hart Robinsons M.D. Jerilynn Mages PA-C assistant Anesthesia general Estimated blood loss minimal Drains one small drain Tourniquet time 20 minutes Complications none Disposition PACU stable.  Operative details Patient family counseling holding area cracks side marked. Taken to the OR placed in supine position under general anesthesia. Left lower extremity elevated prepped with DuraPrep and draped into sterile fashion. Timeout done confirming last side. Exsanguinated with Esmarch tourniquet inflated to 2 50 mmHg. The previous open area was proximal which extended distally but there is a of hard necrotic skin. Based on his articular 15 blade sharply excised the old scar from distal to proximal excising the dead skin. Mealey visible were multiple large pieces of wood slivers. These were sequentially removed thoroughly. All necrotic tissue was debrided. There is no frank pus or purulence found. Foreign-body reaction was excised. Skin fashion mobilized medial and lateral. Procedure remained extra-articular. The copious irrigated with saline. Loosely closed the subcutaneous with 2-0 undyed Vicryl. Placed a small drain from distal to proximal, distal part of wound. Loosely closed over the top of this with nylon suture. Sterile compressive dressings applied in a after anesthetizing wound edges were Sensorcaine. Sterile dressings applied. Immobilizer. Tourniquet deflated after dressing applied. A she'll awakened taken from operating room to PACU stable condition. Since no purulence was found patient with discharge to home. Would slivers were given to  patient's husband.  To help with patient positioning prepping and draping surgical procedure and closure Mr. Jerilynn Mages PA-C assistance was needed.

## 2016-08-30 NOTE — Anesthesia Procedure Notes (Signed)
Procedure Name: LMA Insertion Date/Time: 08/30/2016 4:50 PM Performed by: Denna Haggard D Pre-anesthesia Checklist: Patient identified, Emergency Drugs available, Suction available and Patient being monitored Patient Re-evaluated:Patient Re-evaluated prior to induction Oxygen Delivery Method: Circle system utilized Preoxygenation: Pre-oxygenation with 100% oxygen Induction Type: IV induction Ventilation: Mask ventilation without difficulty LMA: LMA inserted LMA Size: 4.0 Number of attempts: 1 Airway Equipment and Method: Bite block Placement Confirmation: positive ETCO2 Tube secured with: Tape Dental Injury: Teeth and Oropharynx as per pre-operative assessment

## 2016-08-30 NOTE — Anesthesia Preprocedure Evaluation (Signed)
Anesthesia Evaluation  Patient identified by MRN, date of birth, ID band Patient awake    Reviewed: Allergy & Precautions, NPO status , Patient's Chart, lab work & pertinent test results  Airway Mallampati: II  TM Distance: >3 FB Neck ROM: Full    Dental no notable dental hx.    Pulmonary neg pulmonary ROS, former smoker,    Pulmonary exam normal breath sounds clear to auscultation       Cardiovascular hypertension, Normal cardiovascular exam Rhythm:Regular Rate:Normal     Neuro/Psych negative neurological ROS  negative psych ROS   GI/Hepatic negative GI ROS, Neg liver ROS,   Endo/Other  negative endocrine ROS  Renal/GU negative Renal ROS  negative genitourinary   Musculoskeletal negative musculoskeletal ROS (+)   Abdominal   Peds negative pediatric ROS (+)  Hematology negative hematology ROS (+)   Anesthesia Other Findings   Reproductive/Obstetrics negative OB ROS                             Anesthesia Physical Anesthesia Plan  ASA: II  Anesthesia Plan: General   Post-op Pain Management:    Induction: Intravenous  PONV Risk Score and Plan: 1 and Ondansetron and Dexamethasone  Airway Management Planned: LMA  Additional Equipment:   Intra-op Plan:   Post-operative Plan: Extubation in OR  Informed Consent: I have reviewed the patients History and Physical, chart, labs and discussed the procedure including the risks, benefits and alternatives for the proposed anesthesia with the patient or authorized representative who has indicated his/her understanding and acceptance.   Dental advisory given  Plan Discussed with: CRNA and Surgeon  Anesthesia Plan Comments:         Anesthesia Quick Evaluation

## 2016-08-30 NOTE — Discharge Instructions (Signed)

## 2016-08-30 NOTE — Anesthesia Postprocedure Evaluation (Signed)
Anesthesia Post Note  Patient: [name]  Procedure(s) Performed: Procedure(s) (LRB): LEFT KNEE INCISION AND DRAINAGE WOUND WITH FOREIGN BODY REMOVAL (Left)     Patient location during evaluation: PACU Anesthesia Type: General Level of consciousness: awake Pain management: pain level controlled Vital Signs Assessment: post-procedure vital signs reviewed and stable Respiratory status: spontaneous breathing Cardiovascular status: stable Anesthetic complications: no    Last Vitals:  Vitals:   08/30/16 1800 08/30/16 1815  BP: (!) 151/88 (!) 141/86  Pulse: 79 81  Resp: 15 19  Temp:      Last Pain:  Vitals:   08/30/16 1815  TempSrc:   PainSc: 2                  Leane Loring

## 2016-08-30 NOTE — H&P (View-Only) (Signed)
NPO AFTER MN  W/ CLEAR LIQUIDS UNTIL 0930 (NO CREAM /MILK PRODUCTS).  ARRIVE AT 7322.  NEEDS ISTAT .   CURRENT EKG IN CHART AND EPIC.  WILL TAKE COZAAR AND LOPRESSOR AM DOS W/ SIP OF WATER.  PT AWARE OWER.

## 2016-08-30 NOTE — H&P (Signed)
TOTAL KNEE ADMISSION H&P   Subjective:  Chief Complaint:left knee pain.  HPI: [name], 76 y.o. female, Patient presents with joint discomfort that had been persistent for several weeks now. Related to a fall at beach. She visited a urgent and attempted and in office I &D. Multiple rounds of ABX. Despite conservative treatments, the patients discomfort has not improved. Imaging was obtained. Other conservative and surgical treatments were discussed in detail. Patient wishes to proceed with surgery as consented. Denies SOB, CP, or calf pain. No Fever, chills, or nausea/ vomiting.   Patient Active Problem List   Diagnosis Date Noted  . History of recurrent UTIs 09/19/2015  . Osteopenia 05/21/2015  . Osteoarthritis of right hip 05/21/2015  . Arrhythmia, long term 02/15/2014  . Esophageal reflux 02/15/2014  . Chronic back pain 06/01/2013  . Change in bowel habits 11/21/2012  . Diarrhea 11/21/2012   Past Medical History:  Diagnosis Date  . Arthritis   . Diverticulosis of colon   . Essential hypertension   . History of basal cell carcinoma (BCC) excision    02-10-2013  left cheek  s/p  moh's sx  . History of palpitations    cardiolgoist-  dr Domenic Polite  . History of recurrent UTIs   . History of syncope    2015-- felt to vasovagal response to pain (per cardiologist note)  . IBS (irritable bowel syndrome)   . Open wound of left knee    warm soaks daily /  neosprin and dressing daily / per per still has drainage  . Patellar bursitis of left knee    pre-patella septic bursitis w/ foreign body  . Right thyroid nodule    x2 right side incidental finding on carotid duplex 03/ 2018-- thyroid ultrasound done , benign , follow-up in one year  . Wears hearing aid in both ears     Past Surgical History:  Procedure Laterality Date  . APPENDECTOMY  age 86  . BREAST BIOPSY  1962   benign  . CATARACT EXTRACTION W/PHACO  06/25/2011   Procedure: CATARACT EXTRACTION PHACO AND INTRAOCULAR  LENS PLACEMENT (IOC);  Surgeon: Williams Che, MD;  Location: [facility];  Service: Ophthalmology;  Laterality: Right;  CDE: 8.90  . CATARACT EXTRACTION W/PHACO Left 05/05/2012   Procedure: CATARACT EXTRACTION PHACO AND INTRAOCULAR LENS PLACEMENT (IOC);  Surgeon: Williams Che, MD;  Location: [facility];  Service: Ophthalmology;  Laterality: Left;  CDE 12.78  . COLONOSCOPY  last one 11-26-2012  . EXCISION MORTON'S NEUROMA Right 2003  approx.  Marland Kitchen KNEE ARTHROSCOPY W/ ACL RECONSTRUCTION Right 1996  . KNEE CARTILAGE SURGERY Right age 24  . MOHS SURGERY  02/10/2013   left cheek -- BCC  . POSTERIOR REPAIR  05-21-2002   dr Glo Herring   symptomatic recetocele  . TONSILLECTOMY AND ADENOIDECTOMY  age 79  . TRANSTHORACIC ECHOCARDIOGRAM  04-17-2016  dr Domenic Polite   ef 60-65%/  trivial MR/ mild TR  . VAGINAL HYSTERECTOMY  1979   w/  Bilateral Salpingoophorectomy    Prescriptions Prior to Admission  Medication Sig Dispense Refill Last Dose  . cefPROZIL (CEFZIL) 500 MG tablet Take 1 tablet (500 mg total) by mouth 2 (two) times daily. 14 tablet 0 08/27/2016  . Flaxseed, Linseed, (FLAX PO) Take by mouth every morning.    08/29/2016 at Unknown time  . ibuprofen (ADVIL,MOTRIN) 200 MG tablet Take 200 mg by mouth as needed. Reported on 08/16/2015   Past Month at Unknown time  . losartan (COZAAR) 50 MG  tablet Take 1 tablet (50 mg total) by mouth every morning. 90 tablet 1 08/30/2016 at 0700  . metoprolol (LOPRESSOR) 50 MG tablet TAKE (1) TABLET TWICE DAILY. (Patient taking differently: Take 50 mg by mouth 2 (two) times daily. TAKE (1) TABLET TWICE DAILY.) 180 tablet 1 08/30/2016 at 0700  . Multiple Vitamins-Minerals (CENTRUM SILVER) tablet Take 1 tablet by mouth daily.   08/28/2016  . OMEGA 3 1000 MG CAPS Take 1 capsule by mouth at bedtime.    08/29/2016 at Unknown time  . Probiotic Product (PROBIOTIC DAILY PO) Take 1 tablet by mouth daily.   08/30/2016 at 0700  . Psyllium (METAMUCIL PO) Take by mouth every evening.     08/30/2016 at 0800  . sulfamethoxazole-trimethoprim (BACTRIM DS,SEPTRA DS) 800-160 MG tablet Take 1 tablet by mouth 2 (two) times daily. 20 tablet 0 08/29/2016  . estradiol (ESTRACE) 0.1 MG/GM vaginal cream Place 2 g vaginally once a week.   More than a month at Unknown time  . tobramycin-dexamethasone (TOBRADEX) ophthalmic solution Place 1 drop into the left eye every 6 (six) hours as needed. 5 mL 1 More than a month at Unknown time   Allergies  Allergen Reactions  . Codeine Other (See Comments)    REACTION: syncope  . Hydrocodone Nausea Only    Dizzy  . Penicillins Rash    Social History  Substance Use Topics  . Smoking status: Former Smoker    Packs/day: 0.50    Years: 10.00    Types: Cigarettes    Quit date: 04/30/1962  . Smokeless tobacco: Never Used  . Alcohol use 8.4 oz/week    14 Glasses of wine per week     Comment: 2 wine daily    Family History  Problem Relation Age of Onset  . Hypertension Mother   . CVA Mother   . Throat cancer Father   . Pseudochol deficiency Neg Hx   . Malignant hyperthermia Neg Hx   . Hypotension Neg Hx   . Anesthesia problems Neg Hx      Review of Systems  Constitutional: Negative.   HENT: Negative.   Eyes: Negative.   Respiratory: Negative.   Cardiovascular: Negative.   Gastrointestinal: Negative.   Genitourinary: Negative.   Musculoskeletal: Positive for joint pain.  Skin: Negative.   Neurological: Negative.   Endo/Heme/Allergies: Negative.   Psychiatric/Behavioral: Negative.     Objective:  Physical Exam  Constitutional: She is oriented to person, place, and time. She appears well-developed.  HENT:  Head: Normocephalic.  Eyes: Pupils are equal, round, and reactive to light.  Neck: Normal range of motion.  Cardiovascular: Intact distal pulses.   Respiratory: Effort normal.  GI: Soft.  Genitourinary:  Genitourinary Comments: deferred  Musculoskeletal:  Left knee erythema  With purulent drainage. LLE grossly n/v intact.   Neurological: She is alert and oriented to person, place, and time.  Skin: There is erythema.  Psychiatric: Her behavior is normal.    Vital signs in last 24 hours: Temp:  [98.2 F (36.8 C)] 98.2 F (36.8 C) (07/26 1352) Pulse Rate:  [66] 66 (07/26 1352) Resp:  [18] 18 (07/26 1352) BP: (134)/(79) 134/79 (07/26 1352) SpO2:  [99 %] 99 % (07/26 1352) Weight:  [52.8 kg (116 lb 8 oz)] 52.8 kg (116 lb 8 oz) (07/26 1352)  Labs:   Estimated body mass index is 22.01 kg/m as calculated from the following:   Height as of this encounter: 5\' 1"  (1.549 m).   Weight as of this encounter:  52.8 kg (116 lb 8 oz).    Assessment/Plan:  Left septic pre-patella bursa: I&D as consented D/c home Follow instructions F/u in office

## 2016-08-30 NOTE — Transfer of Care (Signed)
Immediate Anesthesia Transfer of Care Note  Patient: CORTNY BAMBACH  Procedure(s) Performed: Procedure(s) (LRB): LEFT KNEE INCISION AND DRAINAGE WOUND WITH FOREIGN BODY REMOVAL (Left)  Patient Location: PACU  Anesthesia Type: General  Level of Consciousness: awake, oriented, sedated and patient cooperative  Airway & Oxygen Therapy: Patient Spontanous Breathing and Patient connected to face mask oxygen  Post-op Assessment: Report given to PACU RN and Post -op Vital signs reviewed and stable  Post vital signs: Reviewed and stable  Complications: No apparent anesthesia complications  Last Vitals:  Vitals:   08/30/16 1352 08/30/16 1737  BP: 134/79 (!) (P) 144/73  Pulse: 66   Resp: 18 (P) 18  Temp: 36.8 C (P) 36.4 C    Last Pain:  Vitals:   08/30/16 1352  TempSrc: Oral      Patients Stated Pain Goal: 7 (08/30/16 1425)

## 2016-08-31 ENCOUNTER — Encounter (HOSPITAL_BASED_OUTPATIENT_CLINIC_OR_DEPARTMENT_OTHER): Payer: Self-pay | Admitting: Specialist

## 2016-09-04 LAB — AEROBIC/ANAEROBIC CULTURE W GRAM STAIN (SURGICAL/DEEP WOUND): Culture: NO GROWTH

## 2016-10-02 ENCOUNTER — Other Ambulatory Visit: Payer: Self-pay | Admitting: *Deleted

## 2016-10-02 MED ORDER — METOPROLOL TARTRATE 50 MG PO TABS: ORAL_TABLET | ORAL | 0 refills | Status: DC

## 2016-10-17 DIAGNOSIS — Z4789 Encounter for other orthopedic aftercare: Secondary | ICD-10-CM | POA: Diagnosis not present

## 2016-10-17 DIAGNOSIS — M71161 Other infective bursitis, right knee: Secondary | ICD-10-CM | POA: Diagnosis not present

## 2016-10-23 ENCOUNTER — Ambulatory Visit (INDEPENDENT_AMBULATORY_CARE_PROVIDER_SITE_OTHER): Payer: Medicare Other | Admitting: Family Medicine

## 2016-10-23 ENCOUNTER — Encounter: Payer: Self-pay | Admitting: Family Medicine

## 2016-10-23 VITALS — BP 120/76 | Ht 61.0 in | Wt 118.0 lb

## 2016-10-23 DIAGNOSIS — I10 Essential (primary) hypertension: Secondary | ICD-10-CM | POA: Diagnosis not present

## 2016-10-23 NOTE — Progress Notes (Signed)
   Subjective:    Patient ID: [name], female    DOB: 1940/09/08, 76 y.o.   MRN: [id number]  HPI Patient arrives for surgical clearance for knee replacement in January-Patient is also having to obtain clearance from her cardiologist  Hx of progressive knee troubles  Now due for knee replacement  Blood pressure medicine and blood pressure levels reviewed today with patient. Compliant with blood pressure medicine. States does not miss a dose. No obvious side effects. Blood pressure generally good when checked elsewhere. Watching salt intake.    Due for thi  Review of Systems No headache, no major weight loss or weight gain, no chest pain no back pain abdominal pain no change in bowel habits complete ROS otherwise negative     Objective:   Physical Exam  Alert vitals stable, NAD. Blood pressure good on repeat. HEENT normal. Lungs clear. Heart regular rate and rhythm.       Assessment & Plan:  Impression hypertension. Good control discussed maintain same meds. Presurgical assessment discussed. Should be able to handle anesthesia and perioperative challenge of total knee replacement. Discussed. Form filled out.

## 2016-10-23 NOTE — Telephone Encounter (Signed)
ERROR

## 2016-11-27 DIAGNOSIS — M1711 Unilateral primary osteoarthritis, right knee: Secondary | ICD-10-CM | POA: Diagnosis not present

## 2016-12-03 ENCOUNTER — Ambulatory Visit: Payer: Medicare Other | Admitting: Family Medicine

## 2016-12-18 ENCOUNTER — Ambulatory Visit (INDEPENDENT_AMBULATORY_CARE_PROVIDER_SITE_OTHER): Payer: Medicare Other | Admitting: Family Medicine

## 2016-12-18 ENCOUNTER — Encounter: Payer: Self-pay | Admitting: Family Medicine

## 2016-12-18 VITALS — BP 122/76 | Ht 61.0 in | Wt 116.8 lb

## 2016-12-18 DIAGNOSIS — Z0001 Encounter for general adult medical examination with abnormal findings: Secondary | ICD-10-CM | POA: Diagnosis not present

## 2016-12-18 DIAGNOSIS — Z23 Encounter for immunization: Secondary | ICD-10-CM | POA: Diagnosis not present

## 2016-12-18 DIAGNOSIS — I7300 Raynaud's syndrome without gangrene: Secondary | ICD-10-CM

## 2016-12-18 DIAGNOSIS — Z0000 Encounter for general adult medical examination without abnormal findings: Secondary | ICD-10-CM

## 2016-12-18 MED ORDER — METOPROLOL TARTRATE 50 MG PO TABS: ORAL_TABLET | ORAL | 0 refills | Status: DC

## 2016-12-18 MED ORDER — LOSARTAN POTASSIUM 50 MG PO TABS: 50.0000 mg | ORAL_TABLET | Freq: Every morning | ORAL | 1 refills | Status: DC

## 2016-12-18 NOTE — Patient Instructions (Signed)
Raynaud Phenomenon Raynaud phenomenon is a condition that affects the blood vessels (arteries) that carry blood to your fingers and toes. The arteries that supply blood to your ears or the tip of your nose might also be affected. Raynaud phenomenon causes the arteries to temporarily narrow. As a result, the flow of blood to the affected areas is temporarily decreased. This usually occurs in response to cold temperatures or stress. During an attack, the skin in the affected areas turns white. You may also feel tingling or numbness in those areas. Attacks usually last for only a brief period, and then the blood flow to the area returns to normal. In most cases, Raynaud phenomenon does not cause serious health problems. What are the causes? For many people with this condition, the cause is not known. Raynaud phenomenon is sometimes associated with other diseases, such as scleroderma or lupus. What increases the risk? Raynaud phenomenon can affect anyone, but it develops most often in people who are 20-40 years old. It affects more females than males. What are the signs or symptoms? Symptoms of Raynaud phenomenon may occur when you are exposed to cold temperatures or when you have emotional stress. The symptoms may last for a few minutes or up to several hours. They usually affect your fingers but may also affect your toes, ears, or the tip of your nose. Symptoms may include:  Changes in skin color. The skin in the affected areas will turn pale or white. The skin may then change from white to bluish to red as normal blood flow returns to the area.  Numbness, tingling, or pain in the affected areas.  In severe cases, sores may develop in the affected areas. How is this diagnosed? Your health care provider will do a physical exam and take your medical history. You may be asked to put your hands in cold water to check for a reaction to cold temperature. Blood tests may be done to check for other diseases or  conditions. Your health care provider may also order a test to check the movement of blood through your arteries and veins (vascular ultrasound). How is this treated? Treatment often involves making lifestyle changes and taking steps to control your exposure to cold temperatures. For more severe cases, medicine (calcium channel blockers) may be used to improve blood flow. Surgery is sometimes done to block the nerves that control the affected arteries, but this is rare. Follow these instructions at home:  Avoid exposure to cold by taking these steps: ? If possible, stay indoors during cold weather. ? When you go outside during cold weather, dress in layers and wear mittens, a hat, a scarf, and warm footwear. ? Wear mittens or gloves when handling ice or frozen food. ? Use holders for glasses or cans containing cold drinks. ? Let warm water run for a while before taking a shower or bath. ? Warm up the car before driving in cold weather.  If possible, avoid stressful and emotional situations. Exercise, meditation, and yoga may help you cope with stress. Biofeedback may be useful.  Do not use any tobacco products, including cigarettes, chewing tobacco, or electronic cigarettes. If you need help quitting, ask your health care provider.  Avoid secondhand smoke.  Limit your use of caffeine. Switch to decaffeinated coffee, tea, and soda. Avoid chocolate.  Wear loose fitting socks and comfortable, roomy shoes.  Avoid vibrating tools and machinery.  Take medicines only as directed by your health care provider. Contact a health care provider if:    Your discomfort becomes worse despite lifestyle changes.  You develop sores on your fingers or toes that do not heal.  Your fingers or toes turn black.  You have breaks in the skin on your fingers or toes.  You have a fever.  You have pain or swelling in your joints.  You have a rash.  Your symptoms occur on only one side of your body. This  information is not intended to replace advice given to you by your health care provider. Make sure you discuss any questions you have with your health care provider. Document Released: 01/20/2000 Document Revised: 06/30/2015 Document Reviewed: 07/27/2015 Elsevier Interactive Patient Education  2017 [employer]  

## 2016-12-18 NOTE — Progress Notes (Signed)
Subjective:    Patient ID: [name], female    DOB: 26-May-1940, 76 y.o.   MRN: [id number]  HPI AWV- Annual Wellness Visit  The patient was seen for their annual wellness visit. The patient's past medical history, surgical history, and family history were reviewed. Pertinent vaccines were reviewed ( tetanus, pneumonia, shingles, flu) The patient's medication list was reviewed and updated.  The height and weight were entered. The patient's current BMI is:22.08  Cognitive screening was completed. Outcome of Mini - Cog: pass  Falls within the past 6 months:not since summer  Current tobacco usage: none (All patients who use tobacco were given written and verbal information on quitting)  Recent listing of emergency department/hospitalizations over the past year were reviewed.  current specialist the patient sees on a regular basis: ortho for knee- due to have knee replacement in January  Medicare annual wellness visit patient questionnaire was reviewed.  A written screening schedule for the patient for the next 5-10 years was given. Appropriate discussion of followup regarding next visit was discussed.   Pressing to do a knee replacement, injec not helping, can't do  Long-standing history of rainouts disease.  Seems to be worsening at this time.  Uncomfortable.  Starting to affect her ability to do things on cold days.  Wonders if anything can be Raynaud's diease strikes; occur s  onecce or twice per wk   Longstanding hx worsening now   Colonoscopy doing wel , does not ned for six yrs   mammo due this week , and will get one        Review of Systems  Constitutional: Negative for activity change, appetite change and fatigue.  HENT: Negative for congestion and rhinorrhea.   Eyes: Negative for discharge.  Respiratory: Negative for cough, chest tightness and wheezing.   Cardiovascular: Negative for chest pain.  Gastrointestinal: Negative for abdominal pain, blood in  stool and vomiting.  Endocrine: Negative for polyphagia.  Genitourinary: Negative for difficulty urinating and frequency.  Musculoskeletal: Negative for neck pain.  Skin: Negative for color change.  Allergic/Immunologic: Negative for environmental allergies and food allergies.  Neurological: Negative for weakness and headaches.  Psychiatric/Behavioral: Negative for agitation and behavioral problems.  All other systems reviewed and are negative.      Objective:   Physical Exam  Constitutional: She is oriented to person, place, and time. She appears well-developed and well-nourished.  HENT:  Head: Normocephalic and atraumatic.  Right Ear: External ear normal.  Left Ear: External ear normal.  Eyes: Right eye exhibits no discharge. Left eye exhibits no discharge.  Neck: Normal range of motion. No tracheal deviation present.  Cardiovascular: Normal rate, regular rhythm, normal heart sounds and intact distal pulses. Exam reveals no gallop.  No murmur heard. Pulmonary/Chest: Effort normal and breath sounds normal. No stridor. No respiratory distress. She has no wheezes. She has no rales.  Abdominal: Soft. Bowel sounds are normal. She exhibits no distension and no mass. There is no tenderness. There is no rebound and no guarding.  Musculoskeletal: Normal range of motion. She exhibits no edema or tenderness.  Bilateral blanching distal fingertips.  During the course of the exam in the office became erythematous and more warm.  Ankle pulses excellent radial and ulnar pulses   Lymphadenopathy:    She has no cervical adenopathy.  Neurological: She is alert and oriented to person, place, and time. She exhibits normal muscle tone.  Skin: Skin is warm and dry.  Psychiatric: She has a normal  mood and affect. Her behavior is normal.  Vitals reviewed.         Assessment & Plan:  Impression 1 wellness exam.  Up-to-date on colonoscopy.  Up-to-date on mammogram.  Blood work reviewed.  2.   Rainouts disease.  Discussed at length.  Education of admission given.  Offered potential intervention with changing losartan to calcium channel blocker.  Patient to consider

## 2016-12-20 ENCOUNTER — Telehealth: Payer: Self-pay | Admitting: Family Medicine

## 2016-12-20 DIAGNOSIS — I10 Essential (primary) hypertension: Secondary | ICD-10-CM

## 2016-12-20 DIAGNOSIS — Z13220 Encounter for screening for lipoid disorders: Secondary | ICD-10-CM

## 2016-12-20 DIAGNOSIS — Z79899 Other long term (current) drug therapy: Secondary | ICD-10-CM

## 2016-12-20 NOTE — Telephone Encounter (Signed)
Patient seen Dr. Richardson Landry on 12/18/16.  She wants to clarify if she needs to have labs drawn?

## 2016-12-20 NOTE — Telephone Encounter (Signed)
m7 lip liv

## 2016-12-20 NOTE — Telephone Encounter (Signed)
Spoke with patient and informed her per Dr.Steve McGrath were ordered. Patient verbalized understanding.

## 2016-12-21 ENCOUNTER — Encounter: Payer: Self-pay | Admitting: Family Medicine

## 2016-12-21 DIAGNOSIS — Z1231 Encounter for screening mammogram for malignant neoplasm of breast: Secondary | ICD-10-CM | POA: Diagnosis not present

## 2016-12-25 ENCOUNTER — Telehealth: Payer: Self-pay | Admitting: Family Medicine

## 2016-12-25 NOTE — Telephone Encounter (Signed)
Review screening mammogram results in results folder. °

## 2016-12-31 ENCOUNTER — Other Ambulatory Visit: Payer: Self-pay | Admitting: Family Medicine

## 2017-01-16 ENCOUNTER — Ambulatory Visit: Payer: Self-pay | Admitting: Orthopedic Surgery

## 2017-01-17 DIAGNOSIS — I10 Essential (primary) hypertension: Secondary | ICD-10-CM | POA: Diagnosis not present

## 2017-01-17 DIAGNOSIS — Z13220 Encounter for screening for lipoid disorders: Secondary | ICD-10-CM | POA: Diagnosis not present

## 2017-01-17 DIAGNOSIS — Z79899 Other long term (current) drug therapy: Secondary | ICD-10-CM | POA: Diagnosis not present

## 2017-01-18 LAB — HEPATIC FUNCTION PANEL
ALBUMIN: 4.8 g/dL (ref 3.5–4.8)
ALT: 23 IU/L (ref 0–32)
AST: 21 IU/L (ref 0–40)
Alkaline Phosphatase: 60 IU/L (ref 39–117)
BILIRUBIN TOTAL: 0.4 mg/dL (ref 0.0–1.2)
BILIRUBIN, DIRECT: 0.14 mg/dL (ref 0.00–0.40)
Total Protein: 7.1 g/dL (ref 6.0–8.5)

## 2017-01-18 LAB — BASIC METABOLIC PANEL
BUN / CREAT RATIO: 12 (ref 12–28)
BUN: 9 mg/dL (ref 8–27)
CHLORIDE: 97 mmol/L (ref 96–106)
CO2: 27 mmol/L (ref 20–29)
Calcium: 9.8 mg/dL (ref 8.7–10.3)
Creatinine, Ser: 0.74 mg/dL (ref 0.57–1.00)
GFR calc Af Amer: 90 mL/min/{1.73_m2} (ref >59)
GFR calc non Af Amer: 78 mL/min/{1.73_m2} (ref >59)
GLUCOSE: 93 mg/dL (ref 65–99)
POTASSIUM: 4.9 mmol/L (ref 3.5–5.2)
SODIUM: 137 mmol/L (ref 134–144)

## 2017-01-18 LAB — LIPID PANEL
CHOLESTEROL TOTAL: 221 mg/dL — AB (ref 100–199)
Chol/HDL Ratio: 2.1 ratio (ref 0.0–4.4)
HDL: 104 mg/dL (ref >39)
LDL Calculated: 100 mg/dL — ABNORMAL HIGH (ref 0–99)
TRIGLYCERIDES: 84 mg/dL (ref 0–149)
VLDL Cholesterol Cal: 17 mg/dL (ref 5–40)

## 2017-01-20 ENCOUNTER — Encounter: Payer: Self-pay | Admitting: Family Medicine

## 2017-01-23 DIAGNOSIS — Z961 Presence of intraocular lens: Secondary | ICD-10-CM | POA: Diagnosis not present

## 2017-01-23 DIAGNOSIS — H04552 Acquired stenosis of left nasolacrimal duct: Secondary | ICD-10-CM | POA: Diagnosis not present

## 2017-02-11 NOTE — H&P (Signed)
TOTAL KNEE ADMISSION H&P  Patient is being admitted for right total knee arthroplasty.  Subjective:  Chief Complaint:right knee pain.  HPI: [name], 76 y.o. female, has a history of pain and functional disability in the right knee due to arthritis and has failed non-surgical conservative treatments for greater than 12 weeks to includecorticosteriod injections, viscosupplementation injections, flexibility and strengthening excercises and weight reduction as appropriate.  Onset of symptoms was gradual, starting 4 years ago with gradually worsening course since that time. The patient noted prior procedures on the knee to include  ACL reconstruction on the right knee(s).  Patient currently rates pain in the right knee(s) at 7 out of 10 with activity. Patient has worsening of pain with activity and weight bearing, pain that interferes with activities of daily living and pain with passive range of motion.  Patient has evidence of subchondral sclerosis, periarticular osteophytes and joint space narrowing by imaging studies. 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 There is no active infection.  Patient Active Problem List   Diagnosis Date Noted  . S/P knee surgery 08/30/2016  . History of recurrent UTIs 09/19/2015  . Osteopenia 05/21/2015  . Osteoarthritis of right hip 05/21/2015  . Arrhythmia, long term 02/15/2014  . Esophageal reflux 02/15/2014  . Chronic back pain 06/01/2013  . Change in bowel habits 11/21/2012  . Diarrhea 11/21/2012   Past Medical History:  Diagnosis Date  . Arthritis   . Diverticulosis of colon   . Essential hypertension   . History of basal cell carcinoma (BCC) excision    02-10-2013  left cheek  s/p  moh's sx  . History of palpitations    cardiolgoist-  dr Domenic Polite  . History of recurrent UTIs   . History of syncope    2015-- felt to vasovagal response to pain (per  cardiologist note)  . IBS (irritable bowel syndrome)   . Open wound of left knee    warm soaks daily /  neosprin and dressing daily / per per still has drainage  . Patellar bursitis of left knee    pre-patella septic bursitis w/ foreign body  . Right thyroid nodule    x2 right side incidental finding on carotid duplex 03/ 2018-- thyroid ultrasound done , benign , follow-up in one year  . Wears hearing aid in both ears     Past Surgical History:  Procedure Laterality Date  . APPENDECTOMY  age 17  . BREAST BIOPSY  1962   benign  . CATARACT EXTRACTION W/PHACO  06/25/2011   Procedure: CATARACT EXTRACTION PHACO AND INTRAOCULAR LENS PLACEMENT (IOC);  Surgeon: Williams Che, MD;  Location: [facility];  Service: Ophthalmology;  Laterality: Right;  CDE: 8.90  . CATARACT EXTRACTION W/PHACO Left 05/05/2012   Procedure: CATARACT EXTRACTION PHACO AND INTRAOCULAR LENS PLACEMENT (IOC);  Surgeon: Williams Che, MD;  Location: [facility];  Service: Ophthalmology;  Laterality: Left;  CDE 12.78  . COLONOSCOPY  last one 11-26-2012  . EXCISION MORTON'S NEUROMA Right 2003  approx.  . INCISION AND DRAINAGE WOUND WITH FOREIGN BODY REMOVAL Left 08/30/2016   Procedure: LEFT KNEE INCISION AND DRAINAGE WOUND WITH FOREIGN BODY REMOVAL;  Surgeon: Sydnee Cabal, MD;  Location: [place];  Service: Orthopedics;  Laterality: Left;  . KNEE ARTHROSCOPY W/ ACL RECONSTRUCTION Right 1996  . KNEE CARTILAGE SURGERY Right age 78  . MOHS SURGERY  02/10/2013   left cheek --  BCC  . POSTERIOR REPAIR  05-21-2002   dr Glo Herring   symptomatic recetocele  . TONSILLECTOMY AND ADENOIDECTOMY  age 18  . TRANSTHORACIC ECHOCARDIOGRAM  04-17-2016  dr Domenic Polite   ef 60-65%/  trivial MR/ mild TR  . VAGINAL HYSTERECTOMY  1979   w/  Bilateral Salpingoophorectomy    No current facility-administered medications for this encounter.    Current Outpatient Medications  Medication Sig Dispense Refill Last Dose  . estradiol (ESTRACE)  0.1 MG/GM vaginal cream Place 2 g vaginally once a week.   More than a month at Unknown time  . Flaxseed, Linseed, (FLAX PO) Take by mouth every morning.    08/29/2016 at Unknown time  . losartan (COZAAR) 50 MG tablet Take 1 tablet (50 mg total) every morning by mouth. 90 tablet 1   . losartan (COZAAR) 50 MG tablet TAKE 1 TABLET BY MOUTH EVERY MORNING. 90 tablet 1   . metoprolol tartrate (LOPRESSOR) 50 MG tablet TAKE (1) TABLET TWICE DAILY. 180 tablet 0   . Multiple Vitamins-Minerals (CENTRUM SILVER) tablet Take 1 tablet by mouth daily.   08/28/2016  . OMEGA 3 1000 MG CAPS Take 1 capsule by mouth at bedtime.    08/29/2016 at Unknown time  . Probiotic Product (PROBIOTIC DAILY PO) Take 1 tablet by mouth daily.   08/30/2016 at 0700  . Psyllium (METAMUCIL PO) Take by mouth every evening.    08/30/2016 at 0800  . tobramycin-dexamethasone (TOBRADEX) ophthalmic solution Place 1 drop into the left eye every 6 (six) hours as needed. 5 mL 1 More than a month at Unknown time   Allergies  Allergen Reactions  . Codeine Other (See Comments)    REACTION: syncope  . Hydrocodone Nausea Only    Dizzy  . Penicillins Rash    Social History   Tobacco Use  . Smoking status: Former Smoker    Packs/day: 0.50    Years: 10.00    Pack years: 5.00    Types: Cigarettes    Last attempt to quit: 04/30/1962    Years since quitting: 54.8  . Smokeless tobacco: Never Used  Substance Use Topics  . Alcohol use: Yes    Alcohol/week: 8.4 oz    Types: 14 Glasses of wine per week    Comment: 2 wine daily    Family History  Problem Relation Age of Onset  . Hypertension Mother   . CVA Mother   . Throat cancer Father   . Pseudochol deficiency Neg Hx   . Malignant hyperthermia Neg Hx   . Hypotension Neg Hx   . Anesthesia problems Neg Hx      Review of Systems  Constitutional: Negative.   HENT: Negative.   Eyes: Negative.   Respiratory: Negative.   Cardiovascular: Negative.   Gastrointestinal: Negative.    Genitourinary: Negative.   Musculoskeletal: Positive for joint pain.  Skin: Negative.   Neurological: Negative.   Endo/Heme/Allergies: Negative.   Psychiatric/Behavioral: Negative.     Objective:  Physical Exam  Constitutional: She is oriented to person, place, and time. She appears well-developed.  HENT:  Head: Normocephalic.  Eyes: EOM are normal.  Neck: Normal range of motion.  Cardiovascular: Normal rate and normal heart sounds.  Respiratory: Effort normal.  GI: Soft.  Genitourinary:  Genitourinary Comments: deferred  Musculoskeletal: Normal range of motion.  Neurological: She is alert and oriented to person, place, and time. She has normal reflexes.  Skin: Skin is warm and dry.  Psychiatric: Her behavior is normal.  Vital signs in last 24 hours: BP: ()/()  Arterial Line BP: ()/()   Labs:   Estimated body mass index is 22.07 kg/m as calculated from the following:   Height as of 12/18/16: 5\' 1"  (1.549 m).   Weight as of 12/18/16: 53 kg (116 lb 12.8 oz).   Imaging Review Plain radiographs demonstrate moderate degenerative joint disease of the right knee(s). The overall alignment ismild varus. The bone quality appears to be good for age and reported activity level.  Assessment/Plan:  End stage arthritis, right knee   The patient history, physical examination, clinical judgment of the provider and imaging studies are consistent with end stage degenerative joint disease of the right knee(s) and total knee arthroplasty is deemed medically necessary. The treatment options including medical management, injection therapy arthroscopy and arthroplasty were discussed at length. The risks and benefits of total knee arthroplasty were presented and reviewed. The risks due to aseptic loosening, infection, stiffness, patella tracking problems, thromboembolic complications and other imponderables were discussed. The patient acknowledged the explanation, agreed to proceed with the  plan and consent was signed. Patient is being admitted for inpatient treatment for surgery, pain control, PT, OT, prophylactic antibiotics, VTE prophylaxis, progressive ambulation and ADL's and discharge planning. The patient is planning to be discharged home with home health services.   Will use IV tranexamic acid. Contraindications and adverse affects of Tranexamic acid discussed in detail. Patient denies any of these at this time and understands the risks and benefits.

## 2017-02-15 ENCOUNTER — Encounter: Payer: Self-pay | Admitting: Family Medicine

## 2017-02-15 NOTE — Telephone Encounter (Signed)
ERROR

## 2017-02-22 ENCOUNTER — Other Ambulatory Visit (HOSPITAL_COMMUNITY): Payer: Self-pay | Admitting: Emergency Medicine

## 2017-02-22 NOTE — Progress Notes (Signed)
Cardiac clearance Dr Rozann Lesches 10-19-16 on chart  Medical clearance Dr Baltazar Apo 10-23-16 epic   ECHO 04-17-16 epic   EKG 04-09-16 epic

## 2017-02-22 NOTE — Patient Instructions (Signed)
JAMISHA HOESCHEN  02/22/2017   Your procedure is scheduled on: 03-01-17   Report to [facility] Main  Entrance    Report to admitting at 10:45AM    Call this number if you have problems the morning of surgery [phone]     Remember: Do not eat food :After Midnight. YOU MAY HAVE CLEAR LIQUIDS FROM MIDNIGHT UNTIL 7:15AM! NOTHING BY MOUTH AFTER 7:15AM!     Take these medicines the morning of surgery with A SIP OF WATER: METOPROLOL                           You may not have any metal on your body including hair pins and              piercings  Do not wear jewelry, make-up, lotions, powders or perfumes, deodorant             Do not wear nail polish.  Do not shave  48 hours prior to surgery.     Do not bring valuables to the hospital. [place].  Contacts, dentures or bridgework may not be worn into surgery.  Leave suitcase in the car. After surgery it may be brought to your room.                 Please read over the following fact sheets you were given: _____________________________________________________________________    CLEAR LIQUID DIET   Foods Allowed                                                                     Foods Excluded  Coffee and tea, regular and decaf                             liquids that you cannot  Plain Jell-O in any flavor                                             see through such as: Fruit ices (not with fruit pulp)                                     milk, soups, orange juice  Iced Popsicles                                    All solid food Carbonated beverages, regular and diet                                    Cranberry, grape and apple juices Sports drinks like Gatorade Lightly seasoned clear broth or consume(fat free) Sugar, honey syrup  Sample Menu  Breakfast                                Lunch                                     Supper Cranberry juice                     Beef broth                            Chicken broth Jell-O                                     Grape juice                           Apple juice Coffee or tea                        Jell-O                                      Popsicle                                                Coffee or tea                        Coffee or tea  _____________________________________________________________________  [facility] - Preparing for Surgery Before surgery, you can play an important role.  Because skin is not sterile, your skin needs to be as free of germs as possible.  You can reduce the number of germs on your skin by washing with CHG (chlorahexidine gluconate) soap before surgery.  CHG is an antiseptic cleaner which kills germs and bonds with the skin to continue killing germs even after washing. Please DO NOT use if you have an allergy to CHG or antibacterial soaps.  If your skin becomes reddened/irritated stop using the CHG and inform your nurse when you arrive at Short Stay. Do not shave (including legs and underarms) for at least 48 hours prior to the first CHG shower.  You may shave your face/neck. Please follow these instructions carefully:  1.  Shower with CHG Soap the night before surgery and the  morning of Surgery.  2.  If you choose to wash your hair, wash your hair first as usual with your  normal  shampoo.  3.  After you shampoo, rinse your hair and body thoroughly to remove the  shampoo.                           4.  Use CHG as you would any other liquid soap.  You can apply chg directly  to the skin and wash                       Gently with a scrungie  or clean washcloth.  5.  Apply the CHG Soap to your body ONLY FROM THE NECK DOWN.   Do not use on face/ open                           Wound or open sores. Avoid contact with eyes, ears mouth and genitals (private parts).                       Wash face,  Genitals (private parts) with your normal soap.             6.  Wash  thoroughly, paying special attention to the area where your surgery  will be performed.  7.  Thoroughly rinse your body with warm water from the neck down.  8.  DO NOT shower/wash with your normal soap after using and rinsing off  the CHG Soap.                9.  Pat yourself dry with a clean towel.            10.  Wear clean pajamas.            11.  Place clean sheets on your bed the night of your first shower and do not  sleep with pets. Day of Surgery : Do not apply any lotions/deodorants the morning of surgery.  Please wear clean clothes to the hospital/surgery center.  FAILURE TO FOLLOW THESE INSTRUCTIONS MAY RESULT IN THE CANCELLATION OF YOUR SURGERY PATIENT SIGNATURE_________________________________  NURSE SIGNATURE__________________________________  ________________________________________________________________________   Adam Phenix  An incentive spirometer is a tool that can help keep your lungs clear and active. This tool measures how well you are filling your lungs with each breath. Taking long deep breaths may help reverse or decrease the chance of developing breathing (pulmonary) problems (especially infection) following:  A long period of time when you are unable to move or be active. BEFORE THE PROCEDURE   If the spirometer includes an indicator to show your best effort, your nurse or respiratory therapist will set it to a desired goal.  If possible, sit up straight or lean slightly forward. Try not to slouch.  Hold the incentive spirometer in an upright position. INSTRUCTIONS FOR USE  1. Sit on the edge of your bed if possible, or sit up as far as you can in bed or on a chair. 2. Hold the incentive spirometer in an upright position. 3. Breathe out normally. 4. Place the mouthpiece in your mouth and seal your lips tightly around it. 5. Breathe in slowly and as deeply as possible, raising the piston or the ball toward the top of the column. 6. Hold your  breath for 3-5 seconds or for as long as possible. Allow the piston or ball to fall to the bottom of the column. 7. Remove the mouthpiece from your mouth and breathe out normally. 8. Rest for a few seconds and repeat Steps 1 through 7 at least 10 times every 1-2 hours when you are awake. Take your time and take a few normal breaths between deep breaths. 9. The spirometer may include an indicator to show your best effort. Use the indicator as a goal to work toward during each repetition. 10. After each set of 10 deep breaths, practice coughing to be sure your lungs are clear. If you have an incision (the cut made at the time of surgery), support your incision  when coughing by placing a pillow or rolled up towels firmly against it. Once you are able to get out of bed, walk around indoors and cough well. You may stop using the incentive spirometer when instructed by your caregiver.  RISKS AND COMPLICATIONS  Take your time so you do not get dizzy or light-headed.  If you are in pain, you may need to take or ask for pain medication before doing incentive spirometry. It is harder to take a deep breath if you are having pain. AFTER USE  Rest and breathe slowly and easily.  It can be helpful to keep track of a log of your progress. Your caregiver can provide you with a simple table to help with this. If you are using the spirometer at home, follow these instructions: [place] IF:   You are having difficultly using the spirometer.  You have trouble using the spirometer as often as instructed.  Your pain medication is not giving enough relief while using the spirometer.  You develop fever of 100.5 F (38.1 C) or higher. SEEK IMMEDIATE MEDICAL CARE IF:   You cough up bloody sputum that had not been present before.  You develop fever of 102 F (38.9 C) or greater.  You develop worsening pain at or near the incision site. MAKE SURE YOU:   Understand these instructions.  Will watch  your condition.  Will get help right away if you are not doing well or get worse. Document Released: 06/04/2006 Document Revised: 04/16/2011 Document Reviewed: 08/05/2006 [facility] Patient Information 2014 [place], [place].   ________________________________________________________________________

## 2017-02-25 ENCOUNTER — Ambulatory Visit: Payer: Medicare Other | Admitting: Family Medicine

## 2017-02-25 ENCOUNTER — Encounter: Payer: Self-pay | Admitting: Family Medicine

## 2017-02-25 VITALS — BP 158/94 | Ht 61.0 in | Wt 119.0 lb

## 2017-02-25 DIAGNOSIS — L989 Disorder of the skin and subcutaneous tissue, unspecified: Secondary | ICD-10-CM | POA: Diagnosis not present

## 2017-02-25 DIAGNOSIS — I10 Essential (primary) hypertension: Secondary | ICD-10-CM

## 2017-02-25 NOTE — Progress Notes (Signed)
   Subjective:    Patient ID: [name], female    DOB: [birth year], 77 y.o.   MRN: [id number]  HPIpt arrives today for surgical clearance. Having right knee TKA on 1/25.    Skin surg associate in [place] ,dr leschesher Attempt does not want to see any more.  He has a lesion on the left thigh that is concerning her.  She does not want to wait several months for referral as scheduled.  Blood pressure medicine and blood pressure levels reviewed today with patient. Compliant with blood pressure medicine. States does not miss a dose. No obvious side effects. Blood pressure generally good when checked elsewhere. Watching salt intake.  Facing knee replacement.  Ready to get it done.  Occurring soon.   Review of Systems No headache, no major weight loss or weight gain, no chest pain no back pain abdominal pain no change in bowel habits complete ROS otherwise negative     Objective:   Physical Exam  Alert and oriented, vitals reviewed and stable, NAD ENT-TM's and ext canals WNL bilat via otoscopic exam Soft palate, tonsils and post pharynx WNL via oropharyngeal exam Neck-symmetric, no masses; thyroid nonpalpable and nontender Pulmonary-no tachypnea or accessory muscle use; Clear without wheezes via auscultation Card--no abnrml murmurs, rhythm reg and rate WNL Carotid pulses symmetric, without bruits       Assessment & Plan:  Impression hypertension suboptimally controlled today discussed with 2 close to surgery to consider change in medication.  Discussed.  Surgical clearance filled out.  Skin lesion discussed Derm referral good idea  Greater than 50% of this 25 minute face to face visit was spent in counseling and discussion and coordination of care regarding the above diagnosis/diagnosies  Irritated scaly patch left anterior thigh/Derm referral encouraged

## 2017-02-26 ENCOUNTER — Encounter (HOSPITAL_COMMUNITY): Payer: Self-pay

## 2017-02-26 ENCOUNTER — Other Ambulatory Visit: Payer: Self-pay

## 2017-02-26 ENCOUNTER — Encounter (HOSPITAL_COMMUNITY)
Admission: RE | Admit: 2017-02-26 | Discharge: 2017-02-26 | Disposition: A | Discharge status: Home / Self Care | Payer: Medicare Other | Source: Ambulatory Visit | Attending: Specialist | Admitting: Specialist

## 2017-02-26 DIAGNOSIS — M1711 Unilateral primary osteoarthritis, right knee: Secondary | ICD-10-CM | POA: Insufficient documentation

## 2017-02-26 DIAGNOSIS — K219 Gastro-esophageal reflux disease without esophagitis: Secondary | ICD-10-CM | POA: Diagnosis not present

## 2017-02-26 DIAGNOSIS — Z87440 Personal history of urinary (tract) infections: Secondary | ICD-10-CM | POA: Insufficient documentation

## 2017-02-26 DIAGNOSIS — Z8249 Family history of ischemic heart disease and other diseases of the circulatory system: Secondary | ICD-10-CM | POA: Insufficient documentation

## 2017-02-26 DIAGNOSIS — Z885 Allergy status to narcotic agent status: Secondary | ICD-10-CM | POA: Insufficient documentation

## 2017-02-26 DIAGNOSIS — Z85828 Personal history of other malignant neoplasm of skin: Secondary | ICD-10-CM | POA: Diagnosis not present

## 2017-02-26 DIAGNOSIS — H26491 Other secondary cataract, right eye: Secondary | ICD-10-CM | POA: Diagnosis not present

## 2017-02-26 DIAGNOSIS — I10 Essential (primary) hypertension: Secondary | ICD-10-CM | POA: Insufficient documentation

## 2017-02-26 DIAGNOSIS — Z01812 Encounter for preprocedural laboratory examination: Secondary | ICD-10-CM | POA: Diagnosis not present

## 2017-02-26 DIAGNOSIS — Z0183 Encounter for blood typing: Secondary | ICD-10-CM | POA: Diagnosis not present

## 2017-02-26 DIAGNOSIS — Z87891 Personal history of nicotine dependence: Secondary | ICD-10-CM | POA: Diagnosis not present

## 2017-02-26 DIAGNOSIS — Z823 Family history of stroke: Secondary | ICD-10-CM | POA: Insufficient documentation

## 2017-02-26 DIAGNOSIS — H40013 Open angle with borderline findings, low risk, bilateral: Secondary | ICD-10-CM | POA: Diagnosis not present

## 2017-02-26 DIAGNOSIS — M8580 Other specified disorders of bone density and structure, unspecified site: Secondary | ICD-10-CM | POA: Insufficient documentation

## 2017-02-26 DIAGNOSIS — Z808 Family history of malignant neoplasm of other organs or systems: Secondary | ICD-10-CM | POA: Insufficient documentation

## 2017-02-26 DIAGNOSIS — Z79899 Other long term (current) drug therapy: Secondary | ICD-10-CM | POA: Diagnosis not present

## 2017-02-26 DIAGNOSIS — Z961 Presence of intraocular lens: Secondary | ICD-10-CM | POA: Diagnosis not present

## 2017-02-26 LAB — ABO/RH: ABO/RH(D): O POS

## 2017-02-26 LAB — URINALYSIS, ROUTINE W REFLEX MICROSCOPIC
BILIRUBIN URINE: NEGATIVE
GLUCOSE, UA: NEGATIVE mg/dL
HGB URINE DIPSTICK: NEGATIVE
Ketones, ur: NEGATIVE mg/dL
Leukocytes, UA: NEGATIVE
Nitrite: NEGATIVE
PROTEIN: NEGATIVE mg/dL
Specific Gravity, Urine: 1.009 (ref 1.005–1.030)
pH: 7 (ref 5.0–8.0)

## 2017-02-26 LAB — CBC
HEMATOCRIT: 41.5 % (ref 36.0–46.0)
HEMOGLOBIN: 14.3 g/dL (ref 12.0–15.0)
MCH: 33.1 pg (ref 26.0–34.0)
MCHC: 34.5 g/dL (ref 30.0–36.0)
MCV: 96.1 fL (ref 78.0–100.0)
Platelets: 224 10*3/uL (ref 150–400)
RBC: 4.32 MIL/uL (ref 3.87–5.11)
RDW: 12.1 % (ref 11.5–15.5)
WBC: 7.2 10*3/uL (ref 4.0–10.5)

## 2017-02-26 LAB — APTT: aPTT: 28 seconds (ref 24–36)

## 2017-02-26 LAB — PROTIME-INR
INR: 0.97
Prothrombin Time: 12.8 seconds (ref 11.4–15.2)

## 2017-02-26 LAB — SURGICAL PCR SCREEN
MRSA, PCR: NEGATIVE
Staphylococcus aureus: NEGATIVE

## 2017-02-26 LAB — BASIC METABOLIC PANEL
ANION GAP: 8 (ref 5–15)
BUN: 11 mg/dL (ref 6–20)
CHLORIDE: 100 mmol/L — AB (ref 101–111)
CO2: 25 mmol/L (ref 22–32)
Calcium: 9.5 mg/dL (ref 8.9–10.3)
Creatinine, Ser: 0.66 mg/dL (ref 0.44–1.00)
GFR calc non Af Amer: >60 mL/min (ref >60)
Glucose, Bld: 91 mg/dL (ref 65–99)
POTASSIUM: 4.2 mmol/L (ref 3.5–5.1)
SODIUM: 133 mmol/L — AB (ref 135–145)

## 2017-02-27 ENCOUNTER — Ambulatory Visit: Payer: Self-pay | Admitting: Orthopedic Surgery

## 2017-03-01 ENCOUNTER — Inpatient Hospital Stay (HOSPITAL_COMMUNITY): Admission: RE | Admit: 2017-03-01 | Payer: Medicare Other | Source: Ambulatory Visit | Admitting: Specialist

## 2017-03-01 ENCOUNTER — Encounter (HOSPITAL_COMMUNITY): Admission: RE | Payer: Self-pay | Source: Ambulatory Visit

## 2017-03-01 ENCOUNTER — Ambulatory Visit: Payer: Medicare Other

## 2017-03-01 ENCOUNTER — Encounter: Payer: Self-pay | Admitting: Family Medicine

## 2017-03-01 DIAGNOSIS — M1711 Unilateral primary osteoarthritis, right knee: Secondary | ICD-10-CM | POA: Diagnosis not present

## 2017-03-01 DIAGNOSIS — Z96652 Presence of left artificial knee joint: Secondary | ICD-10-CM | POA: Diagnosis not present

## 2017-03-01 DIAGNOSIS — M25561 Pain in right knee: Secondary | ICD-10-CM | POA: Diagnosis not present

## 2017-03-01 DIAGNOSIS — Z96651 Presence of right artificial knee joint: Secondary | ICD-10-CM | POA: Diagnosis not present

## 2017-03-01 LAB — TYPE AND SCREEN
ABO/RH(D): O POS
ANTIBODY SCREEN: NEGATIVE

## 2017-03-01 SURGERY — ARTHROPLASTY, KNEE, TOTAL
Anesthesia: Spinal | Site: Knee | Laterality: Right

## 2017-03-01 NOTE — Progress Notes (Signed)
Patient called back and reviewed with her via phone the new date and time of surgery Patient instructed to arrive at Admitting on 03/14/2017 at 1015 am and nothing to eat or drink after midnight . Patient instructed to take medications with a sip of water. Patient verbalized understanding. Patient states that she is now having surgery with Dr. Lyla Glassing.

## 2017-03-04 ENCOUNTER — Telehealth: Payer: Self-pay | Admitting: Family Medicine

## 2017-03-04 NOTE — Telephone Encounter (Signed)
Added in Epic

## 2017-03-04 NOTE — Telephone Encounter (Signed)
Patient wanted to make a note in her chart that she received the pneumonia vaccine on 02/28/17 at [facility].

## 2017-03-12 DIAGNOSIS — H04222 Epiphora due to insufficient drainage, left lacrimal gland: Secondary | ICD-10-CM | POA: Diagnosis not present

## 2017-03-12 DIAGNOSIS — H04221 Epiphora due to insufficient drainage, right lacrimal gland: Secondary | ICD-10-CM | POA: Diagnosis not present

## 2017-03-12 DIAGNOSIS — H26491 Other secondary cataract, right eye: Secondary | ICD-10-CM | POA: Diagnosis not present

## 2017-03-12 DIAGNOSIS — H40013 Open angle with borderline findings, low risk, bilateral: Secondary | ICD-10-CM | POA: Diagnosis not present

## 2017-03-12 DIAGNOSIS — H04552 Acquired stenosis of left nasolacrimal duct: Secondary | ICD-10-CM | POA: Diagnosis not present

## 2017-03-12 DIAGNOSIS — Z961 Presence of intraocular lens: Secondary | ICD-10-CM | POA: Diagnosis not present

## 2017-03-12 DIAGNOSIS — H04551 Acquired stenosis of right nasolacrimal duct: Secondary | ICD-10-CM | POA: Diagnosis not present

## 2017-03-12 DIAGNOSIS — H04412 Chronic dacryocystitis of left lacrimal passage: Secondary | ICD-10-CM | POA: Diagnosis not present

## 2017-03-13 MED ORDER — TRANEXAMIC ACID 1000 MG/10ML IV SOLN
1000.0000 mg | INTRAVENOUS | Status: AC
  Administered 2017-03-14: 1000 mg via INTRAVENOUS
  Filled 2017-03-13: qty 1100

## 2017-03-13 NOTE — Progress Notes (Signed)
Spoke to patient and informed her of new time change for surgery 03/14/2017. Patient instructed to report to Short Stay at [facility] at 0530 am and nothing to drink or eat after midnight and take morning medication with a sip of water only. Patient verbalized understanding.

## 2017-03-13 NOTE — Anesthesia Preprocedure Evaluation (Signed)
Anesthesia Evaluation  Patient identified by MRN, date of birth, ID band Patient awake    Reviewed: Allergy & Precautions, NPO status , Patient's Chart, lab work & pertinent test results, reviewed documented beta blocker date and time   Airway Mallampati: II  TM Distance: >3 FB Neck ROM: Full    Dental no notable dental hx.    Pulmonary neg pulmonary ROS, former smoker,    Pulmonary exam normal breath sounds clear to auscultation       Cardiovascular hypertension, Pt. on home beta blockers and Pt. on medications Normal cardiovascular exam Rhythm:Regular Rate:Normal     Neuro/Psych negative neurological ROS  negative psych ROS   GI/Hepatic Neg liver ROS, GERD  ,  Endo/Other  negative endocrine ROS  Renal/GU negative Renal ROS     Musculoskeletal  (+) Arthritis ,   Abdominal   Peds  Hematology negative hematology ROS (+)   Anesthesia Other Findings   Reproductive/Obstetrics negative OB ROS                             Anesthesia Physical  Anesthesia Plan  ASA: II  Anesthesia Plan: Spinal   Post-op Pain Management:  Regional for Post-op pain   Induction:   PONV Risk Score and Plan: 1 and Ondansetron and Propofol infusion  Airway Management Planned:   Additional Equipment:   Intra-op Plan:   Post-operative Plan:   Informed Consent: I have reviewed the patients History and Physical, chart, labs and discussed the procedure including the risks, benefits and alternatives for the proposed anesthesia with the patient or authorized representative who has indicated his/her understanding and acceptance.   Dental advisory given  Plan Discussed with: CRNA  Anesthesia Plan Comments:         Anesthesia Quick Evaluation

## 2017-03-14 ENCOUNTER — Inpatient Hospital Stay (HOSPITAL_COMMUNITY): Payer: Medicare Other | Admitting: Anesthesiology

## 2017-03-14 ENCOUNTER — Inpatient Hospital Stay (HOSPITAL_COMMUNITY): Payer: Medicare Other

## 2017-03-14 ENCOUNTER — Other Ambulatory Visit: Payer: Self-pay

## 2017-03-14 ENCOUNTER — Encounter (HOSPITAL_COMMUNITY)
Admission: RE | Disposition: A | Discharge status: Home / Self Care | Payer: Self-pay | Source: Home / Self Care | Attending: Orthopedic Surgery

## 2017-03-14 ENCOUNTER — Encounter (HOSPITAL_COMMUNITY): Payer: Self-pay | Admitting: Emergency Medicine

## 2017-03-14 ENCOUNTER — Inpatient Hospital Stay (HOSPITAL_COMMUNITY)
Admission: RE | Admit: 2017-03-14 | Discharge: 2017-03-16 | DRG: 470 | Disposition: A | Discharge status: Home / Self Care | Payer: Medicare Other | Attending: Orthopedic Surgery | Admitting: Orthopedic Surgery

## 2017-03-14 DIAGNOSIS — K219 Gastro-esophageal reflux disease without esophagitis: Secondary | ICD-10-CM | POA: Diagnosis present

## 2017-03-14 DIAGNOSIS — K589 Irritable bowel syndrome without diarrhea: Secondary | ICD-10-CM | POA: Diagnosis present

## 2017-03-14 DIAGNOSIS — Z880 Allergy status to penicillin: Secondary | ICD-10-CM

## 2017-03-14 DIAGNOSIS — Z885 Allergy status to narcotic agent status: Secondary | ICD-10-CM | POA: Diagnosis not present

## 2017-03-14 DIAGNOSIS — D62 Acute posthemorrhagic anemia: Secondary | ICD-10-CM | POA: Diagnosis not present

## 2017-03-14 DIAGNOSIS — M25561 Pain in right knee: Secondary | ICD-10-CM | POA: Diagnosis present

## 2017-03-14 DIAGNOSIS — M8580 Other specified disorders of bone density and structure, unspecified site: Secondary | ICD-10-CM | POA: Diagnosis not present

## 2017-03-14 DIAGNOSIS — Z8249 Family history of ischemic heart disease and other diseases of the circulatory system: Secondary | ICD-10-CM

## 2017-03-14 DIAGNOSIS — Z974 Presence of external hearing-aid: Secondary | ICD-10-CM

## 2017-03-14 DIAGNOSIS — G8929 Other chronic pain: Secondary | ICD-10-CM | POA: Diagnosis not present

## 2017-03-14 DIAGNOSIS — Z87891 Personal history of nicotine dependence: Secondary | ICD-10-CM

## 2017-03-14 DIAGNOSIS — E041 Nontoxic single thyroid nodule: Secondary | ICD-10-CM | POA: Diagnosis present

## 2017-03-14 DIAGNOSIS — I10 Essential (primary) hypertension: Secondary | ICD-10-CM | POA: Diagnosis present

## 2017-03-14 DIAGNOSIS — M1711 Unilateral primary osteoarthritis, right knee: Secondary | ICD-10-CM | POA: Diagnosis not present

## 2017-03-14 DIAGNOSIS — M1611 Unilateral primary osteoarthritis, right hip: Secondary | ICD-10-CM | POA: Diagnosis not present

## 2017-03-14 DIAGNOSIS — G8918 Other acute postprocedural pain: Secondary | ICD-10-CM | POA: Diagnosis not present

## 2017-03-14 DIAGNOSIS — M25761 Osteophyte, right knee: Secondary | ICD-10-CM | POA: Diagnosis not present

## 2017-03-14 DIAGNOSIS — M21161 Varus deformity, not elsewhere classified, right knee: Secondary | ICD-10-CM | POA: Diagnosis present

## 2017-03-14 DIAGNOSIS — Z96651 Presence of right artificial knee joint: Secondary | ICD-10-CM

## 2017-03-14 DIAGNOSIS — M1731 Unilateral post-traumatic osteoarthritis, right knee: Secondary | ICD-10-CM

## 2017-03-14 DIAGNOSIS — Z85828 Personal history of other malignant neoplasm of skin: Secondary | ICD-10-CM

## 2017-03-14 DIAGNOSIS — Z471 Aftercare following joint replacement surgery: Secondary | ICD-10-CM | POA: Diagnosis not present

## 2017-03-14 DIAGNOSIS — Z79899 Other long term (current) drug therapy: Secondary | ICD-10-CM | POA: Diagnosis not present

## 2017-03-14 DIAGNOSIS — Z87440 Personal history of urinary (tract) infections: Secondary | ICD-10-CM

## 2017-03-14 DIAGNOSIS — Z79890 Hormone replacement therapy (postmenopausal): Secondary | ICD-10-CM | POA: Diagnosis not present

## 2017-03-14 HISTORY — PX: KNEE ARTHROPLASTY: SHX992

## 2017-03-14 LAB — TYPE AND SCREEN
ABO/RH(D): O POS
ANTIBODY SCREEN: NEGATIVE

## 2017-03-14 SURGERY — ARTHROPLASTY, KNEE, TOTAL, USING IMAGELESS COMPUTER-ASSISTED NAVIGATION
Anesthesia: Spinal | Site: Knee | Laterality: Right

## 2017-03-14 MED ORDER — SODIUM CHLORIDE 0.9 % IJ SOLN
INTRAMUSCULAR | Status: DC | PRN
  Administered 2017-03-14: 29 mL

## 2017-03-14 MED ORDER — BUPIVACAINE-EPINEPHRINE 0.25% -1:200000 IJ SOLN
INTRAMUSCULAR | Status: AC
  Filled 2017-03-14: qty 1

## 2017-03-14 MED ORDER — MENTHOL 3 MG MT LOZG: 1.0000 | LOZENGE | OROMUCOSAL | Status: DC | PRN

## 2017-03-14 MED ORDER — ONDANSETRON HCL 4 MG PO TABS: 4.0000 mg | ORAL_TABLET | Freq: Four times a day (QID) | ORAL | Status: DC | PRN

## 2017-03-14 MED ORDER — ISOPROPYL ALCOHOL 70 % SOLN
Status: DC | PRN
  Administered 2017-03-14: 1 via TOPICAL

## 2017-03-14 MED ORDER — KETOROLAC TROMETHAMINE 30 MG/ML IJ SOLN
INTRAMUSCULAR | Status: AC
  Filled 2017-03-14: qty 1

## 2017-03-14 MED ORDER — LIDOCAINE 2% (20 MG/ML) 5 ML SYRINGE
INTRAMUSCULAR | Status: AC
  Filled 2017-03-14: qty 5

## 2017-03-14 MED ORDER — ALUM & MAG HYDROXIDE-SIMETH 200-200-20 MG/5ML PO SUSP: 30.0000 mL | ORAL | Status: DC | PRN

## 2017-03-14 MED ORDER — HYDROMORPHONE HCL 1 MG/ML IJ SOLN: 0.2500 mg | INTRAMUSCULAR | Status: DC | PRN

## 2017-03-14 MED ORDER — PHENOL 1.4 % MT LIQD
1.0000 | OROMUCOSAL | Status: DC | PRN
  Filled 2017-03-14: qty 177

## 2017-03-14 MED ORDER — SENNA 8.6 MG PO TABS: 2.0000 | ORAL_TABLET | Freq: Every day | ORAL | 3 refills | Status: DC

## 2017-03-14 MED ORDER — PROPOFOL 10 MG/ML IV BOLUS
INTRAVENOUS | Status: AC
  Filled 2017-03-14: qty 40

## 2017-03-14 MED ORDER — BUPIVACAINE IN DEXTROSE 0.75-8.25 % IT SOLN
INTRATHECAL | Status: DC | PRN
  Administered 2017-03-14: 1.8 mL via INTRATHECAL

## 2017-03-14 MED ORDER — METHOCARBAMOL 500 MG PO TABS
500.0000 mg | ORAL_TABLET | Freq: Four times a day (QID) | ORAL | Status: DC | PRN
  Administered 2017-03-14 – 2017-03-16 (×2): 500 mg via ORAL
  Filled 2017-03-14 (×2): qty 1

## 2017-03-14 MED ORDER — TRANEXAMIC ACID 1000 MG/10ML IV SOLN
1000.0000 mg | INTRAVENOUS | Status: DC
  Filled 2017-03-14: qty 10

## 2017-03-14 MED ORDER — MEPERIDINE HCL 50 MG/ML IJ SOLN: 6.2500 mg | INTRAMUSCULAR | Status: DC | PRN

## 2017-03-14 MED ORDER — ARTIFICIAL TEARS OPHTHALMIC OINT
TOPICAL_OINTMENT | Freq: Two times a day (BID) | OPHTHALMIC | Status: DC
  Administered 2017-03-14 – 2017-03-15 (×2): via OPHTHALMIC
  Filled 2017-03-14: qty 3.5

## 2017-03-14 MED ORDER — ASPIRIN 81 MG PO CHEW
81.0000 mg | CHEWABLE_TABLET | Freq: Two times a day (BID) | ORAL | Status: DC
  Administered 2017-03-14 – 2017-03-16 (×4): 81 mg via ORAL
  Filled 2017-03-14 (×4): qty 1

## 2017-03-14 MED ORDER — DOCUSATE SODIUM 100 MG PO CAPS
100.0000 mg | ORAL_CAPSULE | Freq: Two times a day (BID) | ORAL | Status: DC
  Administered 2017-03-14 – 2017-03-16 (×4): 100 mg via ORAL
  Filled 2017-03-14 (×4): qty 1

## 2017-03-14 MED ORDER — ONDANSETRON HCL 4 MG/2ML IJ SOLN
INTRAMUSCULAR | Status: DC | PRN
  Administered 2017-03-14: 4 mg via INTRAVENOUS

## 2017-03-14 MED ORDER — RISAQUAD PO CAPS
1.0000 | ORAL_CAPSULE | Freq: Every day | ORAL | Status: DC
  Administered 2017-03-14 – 2017-03-16 (×3): 1 via ORAL
  Filled 2017-03-14 (×3): qty 1

## 2017-03-14 MED ORDER — BUPIVACAINE-EPINEPHRINE 0.25% -1:200000 IJ SOLN
INTRAMUSCULAR | Status: DC | PRN
  Administered 2017-03-14: 30 mL

## 2017-03-14 MED ORDER — LIDOCAINE 2% (20 MG/ML) 5 ML SYRINGE
INTRAMUSCULAR | Status: DC | PRN
  Administered 2017-03-14: 100 mg via INTRAVENOUS

## 2017-03-14 MED ORDER — EPHEDRINE 5 MG/ML INJ
INTRAVENOUS | Status: AC
  Filled 2017-03-14: qty 10

## 2017-03-14 MED ORDER — SODIUM CHLORIDE 0.9 % IV SOLN: INTRAVENOUS | Status: DC

## 2017-03-14 MED ORDER — DEXAMETHASONE SODIUM PHOSPHATE 10 MG/ML IJ SOLN
INTRAMUSCULAR | Status: AC
  Filled 2017-03-14: qty 1

## 2017-03-14 MED ORDER — SODIUM CHLORIDE 0.9 % IR SOLN
Status: DC | PRN
  Administered 2017-03-14: 4000 mL

## 2017-03-14 MED ORDER — KETOROLAC TROMETHAMINE 30 MG/ML IJ SOLN: 15.0000 mg | Freq: Once | INTRAMUSCULAR | Status: DC | PRN

## 2017-03-14 MED ORDER — KETOROLAC TROMETHAMINE 15 MG/ML IJ SOLN
7.5000 mg | Freq: Four times a day (QID) | INTRAMUSCULAR | Status: AC
  Administered 2017-03-14 – 2017-03-15 (×4): 7.5 mg via INTRAVENOUS
  Filled 2017-03-14 (×4): qty 1

## 2017-03-14 MED ORDER — PROPOFOL 500 MG/50ML IV EMUL
INTRAVENOUS | Status: DC | PRN
  Administered 2017-03-14: 40 ug/kg/min via INTRAVENOUS

## 2017-03-14 MED ORDER — LACTATED RINGERS IV SOLN
INTRAVENOUS | Status: DC | PRN
  Administered 2017-03-14: 06:00:00 via INTRAVENOUS

## 2017-03-14 MED ORDER — PROMETHAZINE HCL 25 MG/ML IJ SOLN: 6.2500 mg | INTRAMUSCULAR | Status: DC | PRN

## 2017-03-14 MED ORDER — POLYETHYLENE GLYCOL 3350 17 G PO PACK: 17.0000 g | PACK | Freq: Every day | ORAL | Status: DC | PRN

## 2017-03-14 MED ORDER — MIDAZOLAM HCL 2 MG/2ML IJ SOLN
INTRAMUSCULAR | Status: AC
  Filled 2017-03-14: qty 2

## 2017-03-14 MED ORDER — DEXAMETHASONE SODIUM PHOSPHATE 10 MG/ML IJ SOLN
10.0000 mg | Freq: Once | INTRAMUSCULAR | Status: AC
  Administered 2017-03-14: 10 mg via INTRAVENOUS

## 2017-03-14 MED ORDER — DIPHENHYDRAMINE HCL 12.5 MG/5ML PO ELIX: 12.5000 mg | ORAL_SOLUTION | ORAL | Status: DC | PRN

## 2017-03-14 MED ORDER — LACTATED RINGERS IV SOLN: INTRAVENOUS | Status: DC

## 2017-03-14 MED ORDER — CHLORHEXIDINE GLUCONATE 4 % EX LIQD: 60.0000 mL | Freq: Once | CUTANEOUS | Status: DC

## 2017-03-14 MED ORDER — PROPOFOL 10 MG/ML IV BOLUS
INTRAVENOUS | Status: AC
  Filled 2017-03-14: qty 20

## 2017-03-14 MED ORDER — ACETAMINOPHEN 325 MG PO TABS
650.0000 mg | ORAL_TABLET | ORAL | Status: DC | PRN
  Administered 2017-03-14: 650 mg via ORAL
  Filled 2017-03-14: qty 2

## 2017-03-14 MED ORDER — SALINE SPRAY 0.65 % NA SOLN
1.0000 | Freq: Every day | NASAL | Status: DC | PRN
  Filled 2017-03-14: qty 44

## 2017-03-14 MED ORDER — ONDANSETRON HCL 4 MG/2ML IJ SOLN: 4.0000 mg | Freq: Four times a day (QID) | INTRAMUSCULAR | Status: DC | PRN

## 2017-03-14 MED ORDER — ACETAMINOPHEN 10 MG/ML IV SOLN
1000.0000 mg | INTRAVENOUS | Status: AC
  Administered 2017-03-14: 1000 mg via INTRAVENOUS
  Filled 2017-03-14: qty 100

## 2017-03-14 MED ORDER — FENTANYL CITRATE (PF) 100 MCG/2ML IJ SOLN
INTRAMUSCULAR | Status: DC | PRN
  Administered 2017-03-14: 50 ug via INTRAVENOUS

## 2017-03-14 MED ORDER — SODIUM CHLORIDE 0.9 % IJ SOLN
INTRAMUSCULAR | Status: AC
  Filled 2017-03-14: qty 50

## 2017-03-14 MED ORDER — VANCOMYCIN HCL IN DEXTROSE 1-5 GM/200ML-% IV SOLN
1000.0000 mg | INTRAVENOUS | Status: DC
  Filled 2017-03-14: qty 200

## 2017-03-14 MED ORDER — POVIDONE-IODINE 10 % EX SWAB
2.0000 "application " | Freq: Once | CUTANEOUS | Status: AC
  Administered 2017-03-14: 2 via TOPICAL

## 2017-03-14 MED ORDER — PSYLLIUM 95 % PO PACK
1.0000 | PACK | Freq: Every day | ORAL | Status: DC
  Administered 2017-03-14 – 2017-03-15 (×2): 1 via ORAL
  Filled 2017-03-14 (×3): qty 1

## 2017-03-14 MED ORDER — MIDAZOLAM HCL 5 MG/5ML IJ SOLN
INTRAMUSCULAR | Status: DC | PRN
  Administered 2017-03-14: 1 mg via INTRAVENOUS

## 2017-03-14 MED ORDER — HYDROMORPHONE HCL 2 MG PO TABS
1.0000 mg | ORAL_TABLET | ORAL | 0 refills | Status: DC | PRN
Start: 2017-03-14 — End: 2017-06-05

## 2017-03-14 MED ORDER — DOCUSATE SODIUM 100 MG PO CAPS: 100.0000 mg | ORAL_CAPSULE | Freq: Two times a day (BID) | ORAL | 3 refills | Status: DC

## 2017-03-14 MED ORDER — ISOPROPYL ALCOHOL 70 % SOLN
Status: AC
  Filled 2017-03-14: qty 480

## 2017-03-14 MED ORDER — ONDANSETRON HCL 4 MG PO TABS: 4.0000 mg | ORAL_TABLET | Freq: Three times a day (TID) | ORAL | 0 refills | Status: DC | PRN

## 2017-03-14 MED ORDER — KETOROLAC TROMETHAMINE 30 MG/ML IJ SOLN
INTRAMUSCULAR | Status: DC | PRN
  Administered 2017-03-14: 30 mg

## 2017-03-14 MED ORDER — ASPIRIN 81 MG PO TABS: 81.0000 mg | ORAL_TABLET | Freq: Two times a day (BID) | ORAL | 1 refills | Status: DC

## 2017-03-14 MED ORDER — SODIUM CHLORIDE 0.9 % IV BOLUS (SEPSIS)
500.0000 mL | Freq: Once | INTRAVENOUS | Status: AC
  Administered 2017-03-14: 500 mL via INTRAVENOUS

## 2017-03-14 MED ORDER — METOCLOPRAMIDE HCL 5 MG PO TABS: 5.0000 mg | ORAL_TABLET | Freq: Three times a day (TID) | ORAL | Status: DC | PRN

## 2017-03-14 MED ORDER — FENTANYL CITRATE (PF) 100 MCG/2ML IJ SOLN
INTRAMUSCULAR | Status: AC
  Filled 2017-03-14: qty 2

## 2017-03-14 MED ORDER — METOCLOPRAMIDE HCL 5 MG/ML IJ SOLN: 5.0000 mg | Freq: Three times a day (TID) | INTRAMUSCULAR | Status: DC | PRN

## 2017-03-14 MED ORDER — CLINDAMYCIN PHOSPHATE 900 MG/50ML IV SOLN
900.0000 mg | INTRAVENOUS | Status: AC
  Administered 2017-03-14: 900 mg via INTRAVENOUS
  Filled 2017-03-14: qty 50

## 2017-03-14 MED ORDER — CLINDAMYCIN PHOSPHATE 600 MG/50ML IV SOLN
600.0000 mg | Freq: Four times a day (QID) | INTRAVENOUS | Status: AC
  Administered 2017-03-14 (×2): 600 mg via INTRAVENOUS
  Filled 2017-03-14 (×2): qty 50

## 2017-03-14 MED ORDER — STERILE WATER FOR IRRIGATION IR SOLN
Status: DC | PRN
  Administered 2017-03-14: 2000 mL

## 2017-03-14 MED ORDER — ACETAMINOPHEN 650 MG RE SUPP: 650.0000 mg | RECTAL | Status: DC | PRN

## 2017-03-14 MED ORDER — DEXAMETHASONE SODIUM PHOSPHATE 10 MG/ML IJ SOLN
10.0000 mg | Freq: Once | INTRAMUSCULAR | Status: AC
  Administered 2017-03-15: 10 mg via INTRAVENOUS
  Filled 2017-03-14: qty 1

## 2017-03-14 MED ORDER — SODIUM CHLORIDE 0.9 % IV SOLN
INTRAVENOUS | Status: DC
  Administered 2017-03-14 – 2017-03-15 (×3): via INTRAVENOUS

## 2017-03-14 MED ORDER — ROPIVACAINE HCL 7.5 MG/ML IJ SOLN
INTRAMUSCULAR | Status: DC | PRN
  Administered 2017-03-14: 20 mL via PERINEURAL

## 2017-03-14 MED ORDER — HYDROMORPHONE HCL 1 MG/ML IJ SOLN
0.5000 mg | INTRAMUSCULAR | Status: DC | PRN
  Administered 2017-03-14: 15:00:00 0.5 mg via INTRAVENOUS
  Filled 2017-03-14: qty 1

## 2017-03-14 MED ORDER — EPHEDRINE SULFATE-NACL 50-0.9 MG/10ML-% IV SOSY
PREFILLED_SYRINGE | INTRAVENOUS | Status: DC | PRN
  Administered 2017-03-14 (×2): 5 mg via INTRAVENOUS

## 2017-03-14 MED ORDER — METOPROLOL TARTRATE 50 MG PO TABS
50.0000 mg | ORAL_TABLET | Freq: Two times a day (BID) | ORAL | Status: DC
  Administered 2017-03-15 – 2017-03-16 (×3): 50 mg via ORAL
  Filled 2017-03-14 (×4): qty 1

## 2017-03-14 MED ORDER — METHOCARBAMOL 1000 MG/10ML IJ SOLN
500.0000 mg | Freq: Four times a day (QID) | INTRAVENOUS | Status: DC | PRN
  Filled 2017-03-14: qty 5

## 2017-03-14 MED ORDER — LOSARTAN POTASSIUM 50 MG PO TABS
50.0000 mg | ORAL_TABLET | Freq: Every morning | ORAL | Status: DC
  Administered 2017-03-15 – 2017-03-16 (×2): 50 mg via ORAL
  Filled 2017-03-14 (×2): qty 1

## 2017-03-14 MED ORDER — 0.9 % SODIUM CHLORIDE (POUR BTL) OPTIME
TOPICAL | Status: DC | PRN
  Administered 2017-03-14: 1000 mL

## 2017-03-14 MED ORDER — HYDROMORPHONE HCL 2 MG PO TABS
1.0000 mg | ORAL_TABLET | ORAL | Status: DC | PRN
  Administered 2017-03-15 (×2): 1 mg via ORAL
  Administered 2017-03-15 – 2017-03-16 (×4): 2 mg via ORAL
  Filled 2017-03-14 (×7): qty 1

## 2017-03-14 MED ORDER — SENNA 8.6 MG PO TABS
2.0000 | ORAL_TABLET | Freq: Every day | ORAL | Status: DC
  Administered 2017-03-14 – 2017-03-15 (×2): 17.2 mg via ORAL
  Filled 2017-03-14 (×2): qty 2

## 2017-03-14 SURGICAL SUPPLY — 68 items
ADH SKN CLS APL DERMABOND .7 (GAUZE/BANDAGES/DRESSINGS) ×1
BAG SPEC THK2 15X12 ZIP CLS (MISCELLANEOUS) ×1
BAG ZIPLOCK 12X15 (MISCELLANEOUS) ×2 IMPLANT
BANDAGE ACE 4X5 VEL STRL LF (GAUZE/BANDAGES/DRESSINGS) ×3 IMPLANT
BANDAGE ACE 6X5 VEL STRL LF (GAUZE/BANDAGES/DRESSINGS) ×3 IMPLANT
BLADE SAW RECIPROCATING 77.5 (BLADE) ×3 IMPLANT
BONE CEMENT SIMPLEX TOBRAMYCIN (Cement) ×6 IMPLANT
CAPT KNEE TRIATH TK-4 ×2 IMPLANT
CEMENT BONE SIMPLEX TOBRAMYCIN (Cement) IMPLANT
CHLORAPREP W/TINT 26ML (MISCELLANEOUS) ×6 IMPLANT
COVER SURGICAL LIGHT HANDLE (MISCELLANEOUS) ×3 IMPLANT
CUFF TOURN SGL QUICK 34 (TOURNIQUET CUFF) ×3
CUFF TRNQT CYL 34X4X40X1 (TOURNIQUET CUFF) ×1 IMPLANT
DECANTER SPIKE VIAL GLASS SM (MISCELLANEOUS) ×6 IMPLANT
DERMABOND ADVANCED (GAUZE/BANDAGES/DRESSINGS) ×2
DERMABOND ADVANCED .7 DNX12 (GAUZE/BANDAGES/DRESSINGS) ×2 IMPLANT
DRAPE SHEET LG 3/4 BI-LAMINATE (DRAPES) ×6 IMPLANT
DRAPE U-SHAPE 47X51 STRL (DRAPES) ×3 IMPLANT
DRESSING AQUACEL AG SP 3.5X10 (GAUZE/BANDAGES/DRESSINGS) IMPLANT
DRSG AQUACEL AG SP 3.5X10 (GAUZE/BANDAGES/DRESSINGS) ×3
ELECT BLADE TIP CTD 4 INCH (ELECTRODE) ×3 IMPLANT
ELECT REM PT RETURN 15FT ADLT (MISCELLANEOUS) ×3 IMPLANT
GAUZE SPONGE 4X4 12PLY STRL (GAUZE/BANDAGES/DRESSINGS) ×3 IMPLANT
GLOVE BIO SURGEON STRL SZ7 (GLOVE) ×2 IMPLANT
GLOVE BIO SURGEON STRL SZ8.5 (GLOVE) ×6 IMPLANT
GLOVE BIOGEL M STRL SZ7.5 (GLOVE) ×2 IMPLANT
GLOVE BIOGEL PI IND STRL 7.0 (GLOVE) IMPLANT
GLOVE BIOGEL PI IND STRL 7.5 (GLOVE) IMPLANT
GLOVE BIOGEL PI IND STRL 8.5 (GLOVE) ×1 IMPLANT
GLOVE BIOGEL PI INDICATOR 7.0 (GLOVE) ×6
GLOVE BIOGEL PI INDICATOR 7.5 (GLOVE) ×8
GLOVE BIOGEL PI INDICATOR 8.5 (GLOVE) ×4
GLOVE SURG SS PI 7.0 STRL IVOR (GLOVE) ×2 IMPLANT
GOWN SPEC L3 XXLG W/TWL (GOWN DISPOSABLE) ×3 IMPLANT
GOWN STRL REUS W/TWL LRG LVL3 (GOWN DISPOSABLE) ×2 IMPLANT
GOWN STRL REUS W/TWL XL LVL3 (GOWN DISPOSABLE) ×6 IMPLANT
HANDPIECE INTERPULSE COAX TIP (DISPOSABLE) ×3
HOOD PEEL AWAY FLYTE STAYCOOL (MISCELLANEOUS) ×6 IMPLANT
K-WIRE 0.9  100L (WIRE) ×2 IMPLANT
K-WIRE NON THREAD 1.1 (WIRE) ×3
KWIRE NON THREAD 1.1 (WIRE) IMPLANT
MARKER SKIN DUAL TIP RULER LAB (MISCELLANEOUS) ×3 IMPLANT
NDL SPNL 18GX3.5 QUINCKE PK (NEEDLE) ×1 IMPLANT
NEEDLE SPNL 18GX3.5 QUINCKE PK (NEEDLE) ×3 IMPLANT
PACK TOTAL KNEE CUSTOM (KITS) ×3 IMPLANT
PADDING CAST COTTON 6X4 STRL (CAST SUPPLIES) ×3 IMPLANT
POSITIONER SURGICAL ARM (MISCELLANEOUS) ×3 IMPLANT
SAW OSC TIP CART 19.5X105X1.3 (SAW) ×3 IMPLANT
SEALER BIPOLAR AQUA 6.0 (INSTRUMENTS) ×3 IMPLANT
SET HNDPC FAN SPRY TIP SCT (DISPOSABLE) ×1 IMPLANT
SET PAD KNEE POSITIONER (MISCELLANEOUS) ×3 IMPLANT
STEM CEMENTED TRIATHLON (Stem) ×2 IMPLANT
STEM EXTENDER 25MM (Stem) ×2 IMPLANT
SUCTION FRAZIER HANDLE 12FR (TUBING) ×2
SUCTION TUBE FRAZIER 12FR DISP (TUBING) ×1 IMPLANT
SUT MNCRL AB 3-0 PS2 18 (SUTURE) ×3 IMPLANT
SUT MON AB 2-0 CT1 36 (SUTURE) ×6 IMPLANT
SUT STRATAFIX PDO 1 14 VIOLET (SUTURE) ×3
SUT STRATFX PDO 1 14 VIOLET (SUTURE) ×1
SUT VIC AB 1 CT1 36 (SUTURE) ×9 IMPLANT
SUT VIC AB 2-0 CT1 27 (SUTURE) ×3
SUT VIC AB 2-0 CT1 TAPERPNT 27 (SUTURE) ×1 IMPLANT
SUTURE STRATFX PDO 1 14 VIOLET (SUTURE) ×1 IMPLANT
SYR 50ML LL SCALE MARK (SYRINGE) ×3 IMPLANT
TOWER CARTRIDGE SMART MIX (DISPOSABLE) ×2 IMPLANT
TRAY FOLEY CATH SILVER 14FR (SET/KITS/TRAYS/PACK) ×2 IMPLANT
WRAP KNEE MAXI GEL POST OP (GAUZE/BANDAGES/DRESSINGS) ×3 IMPLANT
YANKAUER SUCT BULB TIP 10FT TU (MISCELLANEOUS) ×3 IMPLANT

## 2017-03-14 NOTE — Anesthesia Procedure Notes (Signed)
Spinal  Start time: 03/14/2017 7:35 AM End time: 03/14/2017 7:39 AM Staffing Resident/CRNA: Victoriano Lain, CRNA Preanesthetic Checklist Completed: patient identified, site marked, surgical consent, pre-op evaluation, timeout performed, IV checked, risks and benefits discussed and monitors and equipment checked Spinal Block Patient position: sitting Prep: ChloraPrep and site prepped and draped Patient monitoring: heart rate, continuous pulse ox and blood pressure Approach: midline Location: L3-4 Injection technique: single-shot Needle Needle type: Pencan  Needle gauge: 24 G Needle length: 10 cm Additional Notes Spinal kit expiration date checked and verified. One attempt. + CSF, - heme. Pt tolerated well.

## 2017-03-14 NOTE — Op Note (Signed)
OPERATIVE REPORT  SURGEON: Rod Can, MD   ASSISTANT: Nehemiah Massed, PA-C.  PREOPERATIVE DIAGNOSIS: Posttraumatic Right knee arthritis.   POSTOPERATIVE DIAGNOSIS: Posttraumatic  Right knee arthritis.   PROCEDURE: Right total knee arthroplasty with partial hardware removal.   IMPLANTS: Stryker Triathlon CR femur, size 4. Stryker universal tibia, size 4, with 12 x 15 mm cemented stem and 25 mm stem extender.. X3 polyethelyene insert, size 9 mm, CS. 3 button asymmetric patella, size 29 mm. Simplex P bone cement.  ANESTHESIA:  Regional and Spinal  TOURNIQUET TIME: 21 min at 340mm Hg.  ESTIMATED BLOOD LOSS: -250 mL  ANTIBIOTICS: 900 mg clindamycin.  DRAINS: None.  COMPLICATIONS: None   CONDITION: PACU - hemodynamically stable.   BRIEF CLINICAL NOTE: [name] is a 77 y.o. female with a long-standing history of posttraumatic Right knee arthritis with history of previous ACL reconstruction. After failing conservative management, the patient was indicated for total knee arthroplasty. The risks, benefits, and alternatives to the procedure were explained, and the patient elected to proceed.  PROCEDURE IN DETAIL: Regional anesthesia was obtained in the pre-op holding area. Once inside the operative room, spinal anesthesia was obtained, and a foley catheter was inserted. The patient was then positioned, a nonsterile tourniquet was placed, and the lower extremity was prepped and draped in the normal sterile surgical fashion. A time-out was called verifying side and site of surgery. The patient received IV antibiotics within 60 minutes of beginning the procedure.   An anterior approach to the knee was performed utilizing a mid vastus arthrotomy. A medial release was performed and the patellar fat pad was excised. Stryker navigation was used to cut the distal femur perpendicular to  the mechanical axis. A freehand patellar resection was performed, and the patella was sized an prepared with 3 lug holes.  Nagivation was used to make a neutral proximal tibia resection, taking 3 mm of bone from the less affected medial side with 3 degrees of slope. The menisci were excised. A spacer block was placed, and the alignment and balance in extension were confirmed.  I then returned my attention to the tibia.  Within the tibial cut I was able to locate the graft tunnel.  I debrided the graft material and then was able to locate the interference screw.  The screw was buried in bone.  I was able to loosen the bone implant interface with an osteotome and remove the screw.  After debridement of the graft tunnel, she did have a contained defect within the proximal tibia.  The distal femur was sized using the 3-degree external rotation guide referencing the posterior femoral cortex. The appropriate 4-in-1 cutting block was pinned into place. Rotation was checked using Whiteside's line, the epicondylar axis, and then confirmed with a spacer block in flexion. The remaining femoral cuts were performed, taking care to protect the MCL.  The tibia was sized and the trial tray was pinned into place. The remaining trail components were inserted. The knee was stable to varus and valgus stress through a full range of motion. The patella tracked centrally, and the PCL was well balanced. The trial components were removed, and the proximal tibial surface was prepared for a 50 mm stem with a 25 mm extension to bypass the screw tract. The extremity was exsanguinated with an Esmarch, and the tourniquet was inflated. Small drill holes were made in the sclerotic subchondral bone.The cut bony surfaces were irrigated with pulse lavage. Final tibial and patellar components were cemented into place  and excess cement was cleared.  The femoral implant was press-fit.  The trial insert was placed, and the knee was brought into  extension while the cement polymerized. Once the cement was hard, the knee was tested for a final time and found to be well balanced. The trial insert was exchanged for the real polyethylene insert.  The wound was copiously irrigated with normal saline with pulse lavage. Marcaine solution was injected into the periarticular soft tissue. The wound was closed in layers using #1 Vicryl and V-Loc for the fascia, 2-0 Vicryl for the subcutaneous fat, 2-0 Monocryl for the deep dermal layer, 3-0 running Monocryl subcuticular Stitch, and Dermabond for the skin. Once the glue was fully dried, an Aquacell Ag and compressive dressing were applied. The tourniquet was let down, and the patient was transported to the recovery room in stable ondition. Sponge, needle, and instrument counts were correct at the end of the case x2. The patient tolerated the procedure well and there were no known complications.  Please note that a surgical assistant was a medical necessity for this procedure in order to perform it in a safe and expeditious manner. Surgical assistant was necessary to retract the ligaments and vital neurovascular structures to prevent injury to them and also necessary for proper positioning of the limb to allow for anatomic placement of the prosthesis.

## 2017-03-14 NOTE — Discharge Instructions (Signed)
° °Dr. Corinn Stoltzfus °Total Joint Specialist °[facility] °[address] °[place], [place] [address] °[phone] ° °TOTAL KNEE REPLACEMENT POSTOPERATIVE DIRECTIONS ° ° ° °Knee Rehabilitation, Guidelines Following Surgery  °Results after knee surgery are often greatly improved when you follow the exercise, range of motion and muscle strengthening exercises prescribed by your doctor. Safety measures are also important to protect the knee from further injury. Any time any of these exercises cause you to have increased pain or swelling in your knee joint, decrease the amount until you are comfortable again and slowly increase them. If you have problems or questions, call your caregiver or physical therapist for advice.  ° °WEIGHT BEARING °Weight bearing as tolerated with assist device (walker, cane, etc) as directed, use it as long as suggested by your surgeon or therapist, typically at least 4-6 weeks. ° °HOME CARE INSTRUCTIONS  °Remove items at home which could result in a fall. This includes throw rugs or furniture in walking pathways.  °Continue medications as instructed at time of discharge. °You may have some home medications which will be placed on hold until you complete the course of blood thinner medication.  °You may start showering once you are discharged home but do not submerge the incision under water. Just pat the incision dry and apply a dry gauze dressing on daily. °Walk with walker as instructed.  °You may resume a sexual relationship in one month or when given the OK by your doctor.  °· Use walker as long as suggested by your caregivers. °· Avoid periods of inactivity such as sitting longer than an hour when not asleep. This helps prevent blood clots.  °You may put full weight on your legs and walk as much as is comfortable.  °You may return to work once you are cleared by your doctor.  °Do not drive a car for 6 weeks or until released by you surgeon.  °· Do not drive  while taking narcotics.  °Wear the elastic stockings for three weeks following surgery during the day but you may remove then at night. °Make sure you keep all of your appointments after your operation with all of your doctors and caregivers. You should call the office at the above phone number and make an appointment for approximately two weeks after the date of your surgery. °Do not remove your surgical dressing. The dressing is waterproof; you may take showers in 3 days, but do not take tub baths or submerge the dressing. °Please pick up a stool softener and laxative for home use as long as you are requiring pain medications. °· ICE to the affected knee every three hours for 30 minutes at a time and then as needed for pain and swelling.  Continue to use ice on the knee for pain and swelling from surgery. You may notice swelling that will progress down to the foot and ankle.  This is normal after surgery.  Elevate the leg when you are not up walking on it.   °It is important for you to complete the blood thinner medication as prescribed by your doctor. °· Continue to use the breathing machine which will help keep your temperature down.  It is common for your temperature to cycle up and down following surgery, especially at night when you are not up moving around and exerting yourself.  The breathing machine keeps your lungs expanded and your temperature down. ° °RANGE OF MOTION AND STRENGTHENING EXERCISES  °Rehabilitation of the knee is important following   a knee injury or an operation. After just a few days of immobilization, the muscles of the thigh which control the knee become weakened and shrink (atrophy). Knee exercises are designed to build up the tone and strength of the thigh muscles and to improve knee motion. Often times heat used for twenty to thirty minutes before working out will loosen up your tissues and help with improving the range of motion but do not use heat for the first two weeks following  surgery. These exercises can be done on a training (exercise) mat, on the floor, on a table or on a bed. Use what ever works the best and is most comfortable for you Knee exercises include:  °Leg Lifts - While your knee is still immobilized in a splint or cast, you can do straight leg raises. Lift the leg to 60 degrees, hold for 3 sec, and slowly lower the leg. Repeat 10-20 times 2-3 times daily. Perform this exercise against resistance later as your knee gets better.  °Quad and Hamstring Sets - Tighten up the muscle on the front of the thigh (Quad) and hold for 5-10 sec. Repeat this 10-20 times hourly. Hamstring sets are done by pushing the foot backward against an object and holding for 5-10 sec. Repeat as with quad sets.  °A rehabilitation program following serious knee injuries can speed recovery and prevent re-injury in the future due to weakened muscles. Contact your doctor or a physical therapist for more information on knee rehabilitation.  ° °SKILLED REHAB INSTRUCTIONS: °If the patient is transferred to a skilled rehab facility following release from the hospital, a list of the current medications will be sent to the facility for the patient to continue.  When discharged from the skilled rehab facility, please have the facility set up the patient's Home Health Physical Therapy prior to being released. Also, the skilled facility will be responsible for providing the patient with their medications at time of release from the facility to include their pain medication, the muscle relaxants, and their blood thinner medication. If the patient is still at the rehab facility at time of the two week follow up appointment, the skilled rehab facility will also need to assist the patient in arranging follow up appointment in our office and any transportation needs. ° °MAKE SURE YOU:  °Understand these instructions.  °Will watch your condition.  °Will get help right away if you are not doing well or get worse.   ° ° °Pick up stool softner and laxative for home use following surgery while on pain medications. °Do NOT remove your dressing. You may shower.  °Do not take tub baths or submerge incision under water. °May shower starting three days after surgery. °Please use a clean towel to pat the incision dry following showers. °Continue to use ice for pain and swelling after surgery. °Do not use any lotions or creams on the incision until instructed by your surgeon. ° °

## 2017-03-14 NOTE — Evaluation (Signed)
Physical Therapy Evaluation Patient Details Name: [name] MRN: [id number] DOB: 06-05-1940 Today's Date: 03/14/2017   History of Present Illness  Pt s/p R TKR and with hx of syncope  Clinical Impression  Pt s/p R TKR and presents with decreased R LE strength/ROM and post op pain limiting functional mobility.  This date, pt also limited by decreased BP with attempts to ambulate 73/43 - RN aware.  Pt should progress to dc home with family assist.    Follow Up Recommendations DC plan and follow up therapy as arranged by surgeon    Equipment Recommendations  None recommended by PT    Recommendations for Other Services OT consult     Precautions / Restrictions Precautions Precautions: Knee;Fall Restrictions Weight Bearing Restrictions: No Other Position/Activity Restrictions: WBAT      Mobility  Bed Mobility Overal bed mobility: Needs Assistance Bed Mobility: Supine to Sit     Supine to sit: Min assist     General bed mobility comments: cues for sequence and use of L LE to self assist  Transfers Overall transfer level: Needs assistance Equipment used: Rolling walker (2 wheeled) Transfers: Sit to/from Stand Sit to Stand: Min assist         General transfer comment: cues for LE management and use of UEs to self assist  Ambulation/Gait Ambulation/Gait assistance: Min assist Ambulation Distance (Feet): 11 Feet Assistive device: Rolling walker (2 wheeled) Gait Pattern/deviations: Step-to pattern;Decreased step length - right;Decreased step length - left;Shuffle;Trunk flexed Gait velocity: decr Gait velocity interpretation: Below normal speed for age/gender General Gait Details: cues for sequence, posture and position from [employer]            Wheelchair Mobility    Modified Rankin (Stroke Patients Only)       Balance                                             Pertinent Vitals/Pain Pain Assessment: Faces Faces Pain Scale:  Hurts little more Pain Location: R knee Pain Descriptors / Indicators: Aching;Sore Pain Intervention(s): Limited activity within patient's tolerance;Monitored during session;Premedicated before session    [place] expects to be discharged to:: Private residence Living Arrangements: Spouse/significant other Available Help at Discharge: Family Type of Home: House Home Access: Stairs to enter   Technical brewer of Steps: 1 Home Layout: One level Home Equipment: Environmental consultant - 2 wheels;Cane - single point      Prior Function Level of Independence: Independent               Hand Dominance        Extremity/Trunk Assessment   Upper Extremity Assessment Upper Extremity Assessment: Overall WFL for tasks assessed    Lower Extremity Assessment Lower Extremity Assessment: RLE deficits/detail RLE Deficits / Details: Able to perform IND SLR    Cervical / Trunk Assessment Cervical / Trunk Assessment: Normal  Communication   Communication: No difficulties  Cognition Arousal/Alertness: Awake/alert Behavior During Therapy: WFL for tasks assessed/performed Overall Cognitive Status: Within Functional Limits for tasks assessed                                        General Comments      Exercises Total Joint Exercises Ankle Circles/Pumps: AROM;Both;15 reps;Supine   Assessment/Plan  PT Assessment Patient needs continued PT services  PT Problem List Decreased strength;Decreased range of motion;Decreased activity tolerance;Decreased mobility;Decreased knowledge of use of DME;Pain       PT Treatment Interventions DME instruction;Gait training;Stair training;Functional mobility training;Therapeutic activities;Therapeutic exercise;Patient/family education    PT Goals (Current goals can be found in the Care Plan section)  Acute Rehab PT Goals Patient Stated Goal: Regain IND PT Goal Formulation: With patient Time For Goal Achievement:  03/21/17 Potential to Achieve Goals: Good    Frequency 7X/week   Barriers to discharge        Co-evaluation               AM-PAC PT "6 Clicks" Daily Activity  Outcome Measure Difficulty turning over in bed (including adjusting bedclothes, sheets and blankets)?: Unable Difficulty moving from lying on back to sitting on the side of the bed? : Unable Difficulty sitting down on and standing up from a chair with arms (e.g., wheelchair, bedside commode, etc,.)?: Unable Help needed moving to and from a bed to chair (including a wheelchair)?: A Little Help needed walking in hospital room?: A Little Help needed climbing 3-5 steps with a railing? : A Lot 6 Click Score: 11    End of Session Equipment Utilized During Treatment: Gait belt Activity Tolerance: Patient limited by fatigue;Other (comment)(orthostatic) Patient left: in chair;with call bell/phone within reach;with family/visitor present Nurse Communication: Mobility status PT Visit Diagnosis: Difficulty in walking, not elsewhere classified (R26.2)    Time: 0034-9179 PT Time Calculation (min) (ACUTE ONLY): 45 min   Charges:   PT Evaluation $PT Eval Low Complexity: 1 Low PT Treatments $Gait Training: 8-22 mins $Therapeutic Activity: 8-22 mins   PT G Codes:        Pg [phone]   Lailana Shira 03/14/2017, 5:05 PM

## 2017-03-14 NOTE — Anesthesia Postprocedure Evaluation (Signed)
Anesthesia Post Note  Patient: [name]  Procedure(s) Performed: RIGHT TOTAL KNEE ARTHROPLASTY WITH COMPUTER NAVIGATION AND REMOVAL OF HARDWARE (Right Knee)     Patient location during evaluation: PACU Anesthesia Type: Spinal Level of consciousness: awake and alert Pain management: pain level controlled Vital Signs Assessment: post-procedure vital signs reviewed and stable Respiratory status: spontaneous breathing and respiratory function stable Cardiovascular status: blood pressure returned to baseline and stable Postop Assessment: spinal receding Anesthetic complications: no    Last Vitals:  Vitals:   03/14/17 1045 03/14/17 1100  BP: (!) 136/92 (!) 141/94  Pulse: 71 69  Resp: 14 13  Temp:    SpO2: 99% 99%    Last Pain:  Vitals:   03/14/17 1037  TempSrc:   PainSc: 0-No pain    LLE Motor Response: Purposeful movement (03/14/17 1100) LLE Sensation: Decreased (03/14/17 1100) RLE Motor Response: Purposeful movement (03/14/17 1100) RLE Sensation: Decreased (03/14/17 1100) L Sensory Level: S4-Perianal area (03/14/17 1100) R Sensory Level: S4-Perianal area (03/14/17 1100)  Nolon Nations

## 2017-03-14 NOTE — H&P (Signed)
TOTAL KNEE ADMISSION H&P  Patient is being admitted for right total knee arthroplasty.  Subjective:  Chief Complaint:right knee pain.  HPI: [name], 77 y.o. female, has a history of pain and functional disability in the right knee due to arthritis and has failed non-surgical conservative treatments for greater than 12 weeks to includecorticosteriod injections, viscosupplementation injections, flexibility and strengthening excercises and weight reduction as appropriate.  Onset of symptoms was gradual, starting 4 years ago with gradually worsening course since that time. The patient noted prior procedures on the knee to include  ACL reconstruction on the right knee(s).  Patient currently rates pain in the right knee(s) at 7 out of 10 with activity. Patient has worsening of pain with activity and weight bearing, pain that interferes with activities of daily living and pain with passive range of motion.  Patient has evidence of subchondral sclerosis, periarticular osteophytes and joint space narrowing by imaging studies. This patient has had previous ACL reconstruction.  There is no active infection.      Patient Active Problem List   Diagnosis Date Noted  . S/P knee surgery 08/30/2016  . History of recurrent UTIs 09/19/2015  . Osteopenia 05/21/2015  . Osteoarthritis of right hip 05/21/2015  . Arrhythmia, long term 02/15/2014  . Esophageal reflux 02/15/2014  . Chronic back pain 06/01/2013  . Change in bowel habits 11/21/2012  . Diarrhea 11/21/2012       Past Medical History:  Diagnosis Date  . Arthritis   . Diverticulosis of colon   . Essential hypertension   . History of basal cell carcinoma (BCC) excision    02-10-2013  left cheek  s/p  moh's sx  . History of palpitations    cardiolgoist-  dr Domenic Polite  . History of recurrent UTIs   . History of syncope    2015-- felt to vasovagal response to pain (per cardiologist note)  . IBS (irritable bowel syndrome)    . Open wound of left knee    warm soaks daily /  neosprin and dressing daily / per per still has drainage  . Patellar bursitis of left knee    pre-patella septic bursitis w/ foreign body  . Right thyroid nodule    x2 right side incidental finding on carotid duplex 03/ 2018-- thyroid ultrasound done , benign , follow-up in one year  . Wears hearing aid in both ears          Past Surgical History:  Procedure Laterality Date  . APPENDECTOMY  age 19  . BREAST BIOPSY  1962   benign  . CATARACT EXTRACTION W/PHACO  06/25/2011   Procedure: CATARACT EXTRACTION PHACO AND INTRAOCULAR LENS PLACEMENT (IOC);  Surgeon: Williams Che, MD;  Location: [facility];  Service: Ophthalmology;  Laterality: Right;  CDE: 8.90  . CATARACT EXTRACTION W/PHACO Left 05/05/2012   Procedure: CATARACT EXTRACTION PHACO AND INTRAOCULAR LENS PLACEMENT (IOC);  Surgeon: Williams Che, MD;  Location: [facility];  Service: Ophthalmology;  Laterality: Left;  CDE 12.78  . COLONOSCOPY  last one 11-26-2012  . EXCISION MORTON'S NEUROMA Right 2003  approx.  . INCISION AND DRAINAGE WOUND WITH FOREIGN BODY REMOVAL Left 08/30/2016   Procedure: LEFT KNEE INCISION AND DRAINAGE WOUND WITH FOREIGN BODY REMOVAL;  Surgeon: Sydnee Cabal, MD;  Location: [facility];  Service: Orthopedics;  Laterality: Left;  . KNEE ARTHROSCOPY W/ ACL RECONSTRUCTION Right 1996  . KNEE CARTILAGE SURGERY Right age 81  . MOHS SURGERY  02/10/2013   left cheek --  BCC  . POSTERIOR REPAIR  05-21-2002   dr Glo Herring   symptomatic recetocele  . TONSILLECTOMY AND ADENOIDECTOMY  age 70  . TRANSTHORACIC ECHOCARDIOGRAM  04-17-2016  dr Domenic Polite   ef 60-65%/  trivial MR/ mild TR  . VAGINAL HYSTERECTOMY  1979   w/  Bilateral Salpingoophorectomy    No current facility-administered medications for this encounter.           Current Outpatient Medications  Medication Sig Dispense Refill Last Dose  . estradiol (ESTRACE) 0.1 MG/GM  vaginal cream Place 2 g vaginally once a week.   More than a month at Unknown time  . Flaxseed, Linseed, (FLAX PO) Take by mouth every morning.    08/29/2016 at Unknown time  . losartan (COZAAR) 50 MG tablet Take 1 tablet (50 mg total) every morning by mouth. 90 tablet 1   . losartan (COZAAR) 50 MG tablet TAKE 1 TABLET BY MOUTH EVERY MORNING. 90 tablet 1   . metoprolol tartrate (LOPRESSOR) 50 MG tablet TAKE (1) TABLET TWICE DAILY. 180 tablet 0   . Multiple Vitamins-Minerals (CENTRUM SILVER) tablet Take 1 tablet by mouth daily.   08/28/2016  . OMEGA 3 1000 MG CAPS Take 1 capsule by mouth at bedtime.    08/29/2016 at Unknown time  . Probiotic Product (PROBIOTIC DAILY PO) Take 1 tablet by mouth daily.   08/30/2016 at 0700  . Psyllium (METAMUCIL PO) Take by mouth every evening.    08/30/2016 at 0800  . tobramycin-dexamethasone (TOBRADEX) ophthalmic solution Place 1 drop into the left eye every 6 (six) hours as needed. 5 mL 1 More than a month at Unknown time        Allergies  Allergen Reactions  . Codeine Other (See Comments)    REACTION: syncope  . Hydrocodone Nausea Only    Dizzy  . Penicillins Rash    Social History        Tobacco Use  . Smoking status: Former Smoker    Packs/day: 0.50    Years: 10.00    Pack years: 5.00    Types: Cigarettes    Last attempt to quit: 04/30/1962    Years since quitting: 54.8  . Smokeless tobacco: Never Used  Substance Use Topics  . Alcohol use: Yes    Alcohol/week: 8.4 oz    Types: 14 Glasses of wine per week    Comment: 2 wine daily         Family History  Problem Relation Age of Onset  . Hypertension Mother   . CVA Mother   . Throat cancer Father   . Pseudochol deficiency Neg Hx   . Malignant hyperthermia Neg Hx   . Hypotension Neg Hx   . Anesthesia problems Neg Hx      Review of Systems  Constitutional: Negative.   HENT: Negative.   Eyes: Negative.   Respiratory: Negative.    Cardiovascular: Negative.   Gastrointestinal: Negative.   Genitourinary: Negative.   Musculoskeletal: Positive for joint pain.  Skin: Negative.   Neurological: Negative.   Endo/Heme/Allergies: Negative.   Psychiatric/Behavioral: Negative.     Objective:  Physical Exam  Constitutional: She is oriented to person, place, and time. She appears well-developed.  HENT:  Head: Normocephalic.  Eyes: EOM are normal.  Neck: Normal range of motion.  Cardiovascular: Normal rate and normal heart sounds.  Respiratory: Effort normal.  GI: Soft.  Genitourinary:  Genitourinary Comments: deferred  Musculoskeletal: Normal range of motion.  Neurological: She is alert and oriented to person,  place, and time. She has normal reflexes.  Skin: Skin is warm and dry.  Psychiatric: Her behavior is normal.    Vital signs in last 24 hours: BP: ()/()  Arterial Line BP: ()/()   Labs:   Estimated body mass index is 22.07 kg/m as calculated from the following:   Height as of 12/18/16: 5\' 1"  (1.549 m).   Weight as of 12/18/16: 53 kg (116 lb 12.8 oz).   Imaging Review Plain radiographs demonstrate moderate degenerative joint disease of the right knee(s). The overall alignment is significant varus. The bone quality appears to be good for age and reported activity level.  Assessment/Plan:  End stage arthritis, right knee   The patient history, physical examination, clinical judgment of the provider and imaging studies are consistent with end stage degenerative joint disease of the right knee(s) and total knee arthroplasty is deemed medically necessary. The treatment options including medical management, injection therapy arthroscopy and arthroplasty were discussed at length. The risks and benefits of total knee arthroplasty were presented and reviewed. The risks due to aseptic loosening, infection, stiffness, patella tracking problems, thromboembolic complications and other imponderables  were discussed. The patient acknowledged the explanation, agreed to proceed with the plan and consent was signed. Patient is being admitted for inpatient treatment for surgery, pain control, PT, OT, prophylactic antibiotics, VTE prophylaxis, progressive ambulation and ADL's and discharge planning. The patient is planning to be discharged home with outpatient PT.

## 2017-03-14 NOTE — Anesthesia Procedure Notes (Signed)
Anesthesia Regional Block: Adductor canal block   Pre-Anesthetic Checklist: ,, timeout performed, Correct Patient, Correct Site, Correct Laterality, Correct Procedure, Correct Position, site marked, Risks and benefits discussed,  Surgical consent,  Pre-op evaluation,  At surgeon's request and post-op pain management  Laterality: Right  Prep: chloraprep       Needles:  Injection technique: Single-shot  Needle Type: Stimiplex     Needle Length: 9cm  Needle Gauge: 21     Additional Needles:   Procedures:,,,, ultrasound used (permanent image in chart),,,,  Narrative:  Start time: 03/14/2017 7:15 AM End time: 03/14/2017 7:17 AM Injection made incrementally with aspirations every 5 mL.  Performed by: Personally  Anesthesiologist: Nolon Nations, MD  Additional Notes: BP cuff, EKG monitors applied. Sedation begun. Artery and nerve location verified with U/S and anesthetic injected incrementally, slowly, and after negative aspirations under direct u/s guidance. Good fascial /perineural spread. Tolerated well.

## 2017-03-14 NOTE — Transfer of Care (Signed)
Immediate Anesthesia Transfer of Care Note  Patient: [name]  Procedure(s) Performed: RIGHT TOTAL KNEE ARTHROPLASTY WITH COMPUTER NAVIGATION AND REMOVAL OF HARDWARE (Right Knee)  Patient Location: PACU  Anesthesia Type:Regional and Spinal  Level of Consciousness: awake, alert , oriented and patient cooperative  Airway & Oxygen Therapy: Patient Spontanous Breathing and Patient connected to face mask oxygen  Post-op Assessment: Report given to RN and Post -op Vital signs reviewed and stable  Post vital signs: Reviewed and stable  Last Vitals:  Vitals:   03/14/17 0545  BP: (!) 162/94  Pulse: 65  Resp: 18  Temp: 36.8 C  SpO2: 100%    Last Pain:  Vitals:   03/14/17 0545  TempSrc: Oral      Patients Stated Pain Goal: 4 (57/32/20 2542)  Complications: No apparent anesthesia complications

## 2017-03-14 NOTE — Progress Notes (Signed)
Pt originally was scheduled as Dr. Theda Sers pt.  Now is Dr. Sid Falcon pt.  Spoke with Dr. Lyla Glassing regarding clindamycin and vancomycin order during surgery. Dr. Lyla Glassing gave verbal order to discontinue vancomycin and to only administer clindamycin. Courtney Heys, CRNA notified of this.

## 2017-03-14 NOTE — Discharge Summary (Signed)
Physician Discharge Summary  Patient ID: [name] MRN: [id number] DOB/AGE: December 16, 1940 77 y.o.  Admit date: 03/14/2017 Discharge date: 03/16/2017  Admission Diagnoses:  Post-traumatic osteoarthritis of right knee  Discharge Diagnoses:  Principal Problem:   Post-traumatic osteoarthritis of right knee   Past Medical History:  Diagnosis Date  . Arthritis   . Diverticulosis of colon   . Essential hypertension   . History of basal cell carcinoma (BCC) excision    02-10-2013  left cheek  s/p  moh's sx  . History of palpitations    cardiolgoist-  dr Domenic Polite  . History of recurrent UTIs   . History of syncope    2015-- felt to vasovagal response to pain (per cardiologist note)  . IBS (irritable bowel syndrome)   . Open wound of left knee    warm soaks daily /  neosprin and dressing daily / per per still has drainage  . Patellar bursitis of left knee    pre-patella septic bursitis w/ foreign body  . Right thyroid nodule    x2 right side incidental finding on carotid duplex 03/ 2018-- thyroid ultrasound done , benign , follow-up in one year  . Wears hearing aid in both ears     Surgeries: Procedure(s): RIGHT TOTAL KNEE ARTHROPLASTY WITH COMPUTER NAVIGATION AND REMOVAL OF HARDWARE on 03/14/2017   Consultants (if any):   Discharged Condition: Improved  Hospital Course: [name] is an 77 y.o. female who was admitted 03/14/2017 with a diagnosis of Post-traumatic osteoarthritis of right knee and went to the operating room on 03/14/2017 and underwent the above named procedures.    She was given perioperative antibiotics:  Anti-infectives (From admission, onward)   Start     Dose/Rate Route Frequency Ordered Stop   03/14/17 1400  clindamycin (CLEOCIN) IVPB 600 mg     600 mg 100 mL/hr over 30 Minutes Intravenous Every 6 hours 03/14/17 1131 03/14/17 2055   03/14/17 0540  vancomycin (VANCOCIN) IVPB 1000 mg/200 mL premix  Status:  Discontinued     1,000 mg 200 mL/hr over 60  Minutes Intravenous On call to O.R. 03/14/17 0540 03/14/17 0700   03/14/17 0538  clindamycin (CLEOCIN) IVPB 900 mg     900 mg 100 mL/hr over 30 Minutes Intravenous On call to O.R. 03/14/17 8841 03/14/17 6606    .  She was given sequential compression devices, early ambulation, and ASA for DVT prophylaxis.  She benefited maximally from the hospital stay and there were no complications.    Recent vital signs:  Vitals:   03/15/17 2018 03/16/17 0526  BP: (!) 146/78 (!) 155/78  Pulse: 73 81  Resp: 13 17  Temp: 98.3 F (36.8 C) 97.9 F (36.6 C)  SpO2: 100% 100%    Recent laboratory studies:  Lab Results  Component Value Date   HGB 8.9 (L) 03/16/2017   HGB 10.7 (L) 03/15/2017   HGB 14.3 02/26/2017   Lab Results  Component Value Date   WBC 7.7 03/16/2017   PLT 176 03/16/2017   Lab Results  Component Value Date   INR 0.97 02/26/2017   Lab Results  Component Value Date   NA 136 03/15/2017   K 4.0 03/15/2017   CL 105 03/15/2017   CO2 24 03/15/2017   BUN 9 03/15/2017   CREATININE 0.70 03/15/2017   GLUCOSE 100 (H) 03/15/2017    Discharge Medications:   Allergies as of 03/16/2017      Reactions   Codeine Other (See Comments)  REACTION: syncope   Hydrocodone Nausea Only   Dizzy, syncope   Penicillins Rash   Has patient had a PCN reaction causing immediate rash, facial/tongue/throat swelling, SOB or lightheadedness with hypotension: Yes Has patient had a PCN reaction causing severe rash involving mucus membranes or skin necrosis: No Has patient had a PCN reaction that required hospitalization: No Has patient had a PCN reaction occurring within the last 10 years: No If all of the above answers are "NO", then may proceed with Cephalosporin use.      Medication List    TAKE these medications   acetaminophen 500 MG tablet Commonly known as:  TYLENOL Take 500 mg by mouth daily as needed for mild pain. Take with IBU   aspirin 81 MG tablet Take 1 tablet (81 mg total)  by mouth 2 (two) times daily after a meal.   CENTRUM SILVER tablet Take 1 tablet by mouth daily.   docusate sodium 100 MG capsule Commonly known as:  COLACE Take 1 capsule (100 mg total) by mouth 2 (two) times daily.   estradiol 0.1 MG/GM vaginal cream Commonly known as:  ESTRACE Place 2 g vaginally daily as needed (lubrication).   FLAX PO Take 1 Dose by mouth every morning.   HYDROmorphone 2 MG tablet Commonly known as:  DILAUDID Take 0.5-1 tablets (1-2 mg total) by mouth every 4 (four) hours as needed (knee pain).   ibuprofen 200 MG tablet Commonly known as:  ADVIL,MOTRIN Take 400 mg by mouth daily as needed for moderate pain. Take with Tylenol   losartan 50 MG tablet Commonly known as:  COZAAR TAKE 1 TABLET BY MOUTH EVERY MORNING.   METAMUCIL PO Take 1 Scoop by mouth every evening.   metoprolol tartrate 50 MG tablet Commonly known as:  LOPRESSOR TAKE (1) TABLET TWICE DAILY.   Omega 3 1000 MG Caps Take 1,000 mg by mouth at bedtime.   ondansetron 4 MG tablet Commonly known as:  ZOFRAN Take 1 tablet (4 mg total) by mouth every 8 (eight) hours as needed for nausea or vomiting.   PROBIOTIC DAILY PO Take 1 tablet by mouth daily.   senna 8.6 MG Tabs tablet Commonly known as:  SENOKOT Take 2 tablets (17.2 mg total) by mouth at bedtime.   sodium chloride 0.65 % Soln nasal spray Commonly known as:  OCEAN Place 1 spray into both nostrils daily as needed for congestion.   SYSTANE OP Place 1 drop into the left eye 2 (two) times daily.   tobramycin-dexamethasone ophthalmic solution Commonly known as:  TOBRADEX Place 1 drop into the left eye every 6 (six) hours as needed. What changed:    when to take this  reasons to take this       Diagnostic Studies: Dg Knee Right Port  Result Date: 03/14/2017 CLINICAL DATA:  Post right total knee replacement EXAM: PORTABLE RIGHT KNEE - 1-2 VIEW COMPARISON:  05/17/2015 FINDINGS: Changes of right knee replacement. Soft  tissue and joint space gas noted. Old screw in the distal femur from prior ACL repair. No hardware or bony complicating feature. IMPRESSION: Right knee replacement.  No complicating feature. Electronically Signed   By: Rolm Baptise M.D.   On: 03/14/2017 11:04    Disposition: 01-Home or Self Care  Discharge Instructions    Call MD / Call 911   Complete by:  As directed    If you experience chest pain or shortness of breath, CALL 911 and be transported to the hospital emergency room.  If  you develope a fever above 101 F, pus (white drainage) or increased drainage or redness at the wound, or calf pain, call your surgeon's office.   Constipation Prevention   Complete by:  As directed    Drink plenty of fluids.  Prune juice may be helpful.  You may use a stool softener, such as Colace (over the counter) 100 mg twice a day.  Use MiraLax (over the counter) for constipation as needed.   Diet - low sodium heart healthy   Complete by:  As directed    Increase activity slowly as tolerated   Complete by:  As directed       Follow-up Information    Jerriyah Louis, [name], MD. Schedule an appointment as soon as possible for a visit in 2 week(s).   Specialty:  Orthopedic Surgery Why:  For wound re-check Contact information: [address] [place] [address] [phone]           Signed: Hilton Cork Roselyne Stalnaker 03/17/2017, 8:25 PM

## 2017-03-15 LAB — BASIC METABOLIC PANEL
ANION GAP: 7 (ref 5–15)
BUN: 9 mg/dL (ref 6–20)
CALCIUM: 8.7 mg/dL — AB (ref 8.9–10.3)
CHLORIDE: 105 mmol/L (ref 101–111)
CO2: 24 mmol/L (ref 22–32)
CREATININE: 0.7 mg/dL (ref 0.44–1.00)
GFR calc non Af Amer: >60 mL/min (ref >60)
GLUCOSE: 100 mg/dL — AB (ref 65–99)
Potassium: 4 mmol/L (ref 3.5–5.1)
Sodium: 136 mmol/L (ref 135–145)

## 2017-03-15 LAB — CBC
HEMATOCRIT: 30.5 % — AB (ref 36.0–46.0)
HEMOGLOBIN: 10.7 g/dL — AB (ref 12.0–15.0)
MCH: 33.2 pg (ref 26.0–34.0)
MCHC: 35.1 g/dL (ref 30.0–36.0)
MCV: 94.7 fL (ref 78.0–100.0)
Platelets: 203 10*3/uL (ref 150–400)
RBC: 3.22 MIL/uL — ABNORMAL LOW (ref 3.87–5.11)
RDW: 12.1 % (ref 11.5–15.5)
WBC: 11.4 10*3/uL — ABNORMAL HIGH (ref 4.0–10.5)

## 2017-03-15 NOTE — Progress Notes (Signed)
Physical Therapy Treatment Patient Details Name: [name] MRN: [id number] DOB: 11-29-1940 Today's Date: 03/15/2017    History of Present Illness Pt s/p R TKR and with hx of syncope    PT Comments    Marked improvement in activity tolerance with no c/o dizziness.  Pt hopeful for dc home tomorrow.   Follow Up Recommendations  DC plan and follow up therapy as arranged by surgeon     Equipment Recommendations  None recommended by PT    Recommendations for Other Services OT consult     Precautions / Restrictions Precautions Precautions: Knee;Fall Restrictions Weight Bearing Restrictions: No Other Position/Activity Restrictions: WBAT    Mobility  Bed Mobility         Supine to sit: Supervision     General bed mobility comments: HOB raised  Transfers Overall transfer level: Needs assistance Equipment used: Rolling walker (2 wheeled)   Sit to Stand: Supervision;Min guard         General transfer comment: cues for UE/LE placement.  Initially min guard for safety; pt with h/o syncope  Ambulation/Gait Ambulation/Gait assistance: Min assist;Min guard Ambulation Distance (Feet): 111 Feet Assistive device: Rolling walker (2 wheeled) Gait Pattern/deviations: Step-to pattern;Decreased step length - right;Decreased step length - left;Shuffle;Trunk flexed Gait velocity: decr Gait velocity interpretation: Below normal speed for age/gender General Gait Details: cues for sequence, posture and position from [employer]            Wheelchair Mobility    Modified Rankin (Stroke Patients Only)       Balance Overall balance assessment: No apparent balance deficits (not formally assessed)                                          Cognition Arousal/Alertness: Awake/alert Behavior During Therapy: WFL for tasks assessed/performed Overall Cognitive Status: Within Functional Limits for tasks assessed                                         Exercises Total Joint Exercises Ankle Circles/Pumps: AROM;Both;15 reps;Supine Quad Sets: AROM;Both;10 reps;Supine Heel Slides: AAROM;15 reps;Right;Supine Straight Leg Raises: AAROM;Right;15 reps;Supine Goniometric ROM: AAROM R knee -10 - 60    General Comments        Pertinent Vitals/Pain Pain Assessment: 0-10 Pain Score: 3  Faces Pain Scale: Hurts a little bit Pain Location: R knee Pain Descriptors / Indicators: Aching;Sore Pain Intervention(s): Limited activity within patient's tolerance;Monitored during session;Premedicated before session;Ice applied    Home Living Family/patient expects to be discharged to:: Private residence Living Arrangements: Spouse/significant other Available Help at Discharge: Family         Home Equipment: Gilford Rile - 2 wheels;Cane - single point      Prior Function Level of Independence: Independent          PT Goals (current goals can now be found in the care plan section) Acute Rehab PT Goals Patient Stated Goal: Regain IND PT Goal Formulation: With patient Time For Goal Achievement: 03/21/17 Potential to Achieve Goals: Good Progress towards PT goals: Progressing toward goals    Frequency    7X/week      PT Plan Current plan remains appropriate    Co-evaluation              AM-PAC PT "6 Clicks"  Daily Activity  Outcome Measure  Difficulty turning over in bed (including adjusting bedclothes, sheets and blankets)?: Unable Difficulty moving from lying on back to sitting on the side of the bed? : Unable Difficulty sitting down on and standing up from a chair with arms (e.g., wheelchair, bedside commode, etc,.)?: Unable Help needed moving to and from a bed to chair (including a wheelchair)?: A Little Help needed walking in hospital room?: A Little Help needed climbing 3-5 steps with a railing? : A Little 6 Click Score: 12    End of Session Equipment Utilized During Treatment: Gait belt Activity Tolerance:  Patient limited by fatigue;Other (comment) Patient left: in chair;with call bell/phone within reach;with family/visitor present Nurse Communication: Mobility status PT Visit Diagnosis: Difficulty in walking, not elsewhere classified (R26.2)     Time: 1010-1035 PT Time Calculation (min) (ACUTE ONLY): 25 min  Charges:  $Gait Training: 8-22 mins $Therapeutic Exercise: 8-22 mins                    G Codes:       Pg [phone]    Willett Lefeber 03/15/2017, 11:31 AM

## 2017-03-15 NOTE — Progress Notes (Signed)
Physical Therapy Treatment Patient Details Name: [name] MRN: [id number] DOB: Dec 09, 1940 Today's Date: 03/15/2017    History of Present Illness Pt s/p R TKR and with hx of syncope    PT Comments    Pt continues very motivated and progressing well with mobility.  Pt hopeful for return home tomorrow.   Follow Up Recommendations  DC plan and follow up therapy as arranged by surgeon     Equipment Recommendations  None recommended by PT    Recommendations for Other Services OT consult     Precautions / Restrictions Precautions Precautions: Knee;Fall Restrictions Weight Bearing Restrictions: No Other Position/Activity Restrictions: WBAT    Mobility  Bed Mobility                  Transfers Overall transfer level: Needs assistance Equipment used: Rolling walker (2 wheeled) Transfers: Sit to/from Stand Sit to Stand: Supervision;Min guard         General transfer comment: cues for UE/LE placement.  Initially min guard for safety; pt with h/o syncope  Ambulation/Gait Ambulation/Gait assistance: Min guard Ambulation Distance (Feet): 147 Feet Assistive device: Rolling walker (2 wheeled) Gait Pattern/deviations: Step-to pattern;Decreased step length - right;Decreased step length - left;Shuffle;Trunk flexed Gait velocity: decr Gait velocity interpretation: Below normal speed for age/gender General Gait Details: cues for sequence, posture and position from [employer]            Wheelchair Mobility    Modified Rankin (Stroke Patients Only)       Balance Overall balance assessment: No apparent balance deficits (not formally assessed)                                          Cognition Arousal/Alertness: Awake/alert Behavior During Therapy: WFL for tasks assessed/performed Overall Cognitive Status: Within Functional Limits for tasks assessed                                        Exercises Total Joint  Exercises Ankle Circles/Pumps: AROM;Both;15 reps;Supine Quad Sets: AROM;Both;10 reps;Supine Heel Slides: AAROM;15 reps;Right;Supine Straight Leg Raises: AAROM;Right;15 reps;Supine Goniometric ROM: AAROM R knee -10 - 60    General Comments        Pertinent Vitals/Pain Pain Assessment: 0-10 Pain Score: 7  Pain Location: R knee with knee flex - 3/10 at rest Pain Descriptors / Indicators: Aching;Sore Pain Intervention(s): Limited activity within patient's tolerance;Monitored during session;Patient requesting pain meds-RN notified;Ice applied    Home Living                      Prior Function            PT Goals (current goals can now be found in the care plan section) Acute Rehab PT Goals Patient Stated Goal: Regain IND PT Goal Formulation: With patient Time For Goal Achievement: 03/21/17 Potential to Achieve Goals: Good Progress towards PT goals: Progressing toward goals    Frequency    7X/week      PT Plan Current plan remains appropriate    Co-evaluation              AM-PAC PT "6 Clicks" Daily Activity  Outcome Measure  Difficulty turning over in bed (including adjusting bedclothes, sheets and blankets)?: A Lot Difficulty moving from lying  on back to sitting on the side of the bed? : A Lot Difficulty sitting down on and standing up from a chair with arms (e.g., wheelchair, bedside commode, etc,.)?: A Lot Help needed moving to and from a bed to chair (including a wheelchair)?: A Little Help needed walking in hospital room?: A Little Help needed climbing 3-5 steps with a railing? : A Little 6 Click Score: 15    End of Session Equipment Utilized During Treatment: Gait belt Activity Tolerance: Patient limited by fatigue;Other (comment) Patient left: in chair;with call bell/phone within reach;with family/visitor present Nurse Communication: Mobility status PT Visit Diagnosis: Difficulty in walking, not elsewhere classified (R26.2)     Time:  8185-6314 PT Time Calculation (min) (ACUTE ONLY): 23 min  Charges:  $Gait Training: 8-22 mins $Therapeutic Exercise: 8-22 mins                    G Codes:       Pg [phone]    [name] 03/15/2017, 2:12 PM

## 2017-03-15 NOTE — Progress Notes (Signed)
   Subjective:  Patient reports pain as mild.  She does state some increased pain yesterday with therapy but did tolerate therapy well.  She denies shortness of breath, chest pain or dizziness.  No nausea or vomiting.  Objective:   VITALS:   Vitals:   03/14/17 1333 03/14/17 2104 03/15/17 0054 03/15/17 0542  BP: 138/75 (!) 151/73 (!) 142/75 122/62  Pulse: 63 85 89 86  Resp: 15 16 15 17   Temp: (!) 97.5 F (36.4 C) 97.8 F (36.6 C) 98.1 F (36.7 C) 98.4 F (36.9 C)  TempSrc: Oral Oral Oral Oral  SpO2:  98% 99% 98%  Weight:      Height:       Right leg:  Neurovascular intact Sensation intact distally Intact pulses distally Dorsiflexion/Plantar flexion intact Incision: dressing C/D/I   Lab Results  Component Value Date   WBC 11.4 (H) 03/15/2017   HGB 10.7 (L) 03/15/2017   HCT 30.5 (L) 03/15/2017   MCV 94.7 03/15/2017   PLT 203 03/15/2017   BMET    Component Value Date/Time   NA 136 03/15/2017 0619   NA 137 01/17/2017 0825   K 4.0 03/15/2017 0619   CL 105 03/15/2017 0619   CO2 24 03/15/2017 0619   GLUCOSE 100 (H) 03/15/2017 0619   BUN 9 03/15/2017 0619   BUN 9 01/17/2017 0825   CREATININE 0.70 03/15/2017 0619   CREATININE 0.64 01/22/2013 0803   CALCIUM 8.7 (L) 03/15/2017 0619   GFRNONAA >60 03/15/2017 0619   GFRAA >60 03/15/2017 5361     Assessment/Plan: 1 Day Post-Op   Principal Problem:   Post-traumatic osteoarthritis of right knee   Advance diet Up with therapy Acute blood loss anemia with stable hemoglobin this morning and no symptoms. Weight-bear as tolerated to right lower extremity. Aspirin and SCDs for DVT prophylaxis.  Plan for DC home Saturday   Nicholes Stairs 03/15/2017, 7:31 AM   Geralynn Rile, MD [phone]

## 2017-03-15 NOTE — Plan of Care (Signed)
Plan of care reviewed and discussed with patient.   

## 2017-03-15 NOTE — Evaluation (Signed)
Occupational Therapy Evaluation Patient Details Name: [name] MRN: [id number] DOB: 1940-10-10 Today's Date: 03/15/2017    History of Present Illness Pt s/p R TKR and with hx of syncope   Clinical Impression   This 77 year old female was admitted for the above sx. Will plan to see her once more when her husband is here to educate on shower transfer.    Follow Up Recommendations  No OT follow up;Supervision/Assistance - 24 hour    Equipment Recommendations  None recommended by OT(pt may want to consider shower seat)  This is an out of pocket expense    Recommendations for Other Services       Precautions / Restrictions Precautions Precautions: Knee;Fall Restrictions Weight Bearing Restrictions: No Other Position/Activity Restrictions: WBAT      Mobility Bed Mobility         Supine to sit: Supervision     General bed mobility comments: HOB raised  Transfers   Equipment used: Rolling walker (2 wheeled)   Sit to Stand: Supervision;Min guard         General transfer comment: cues for UE/LE placement.  Initially min guard for safety; pt with h/o syncope    Balance                                           ADL either performed or assessed with clinical judgement   ADL Overall ADL's : Needs assistance/impaired Eating/Feeding: Independent   Grooming: Oral care;Supervision/safety;Standing   Upper Body Bathing: Set up;Sitting   Lower Body Bathing: Supervison/ safety;Sit to/from stand   Upper Body Dressing : Set up;Sitting   Lower Body Dressing: Minimal assistance;Sit to/from stand   Toilet Transfer: Min guard;Ambulation;RW;Comfort height toilet   Toileting- Clothing Manipulation and Hygiene: Supervision/safety;Sit to/from stand         General ADL Comments: ambulated to bathroom and performed ADL.  Cues for hand placement on higher commode without anything to hold to (like home)  Reviewed general safety and precautions      Vision         Perception     Praxis      Pertinent Vitals/Pain Pain Assessment: Faces Faces Pain Scale: Hurts a little bit Pain Location: R knee Pain Descriptors / Indicators: Aching;Sore Pain Intervention(s): Limited activity within patient's tolerance;Monitored during session;Premedicated before session;Repositioned     Hand Dominance     Extremity/Trunk Assessment Upper Extremity Assessment Upper Extremity Assessment: Overall WFL for tasks assessed           Communication Communication Communication: HOH   Cognition Arousal/Alertness: Awake/alert Behavior During Therapy: WFL for tasks assessed/performed Overall Cognitive Status: Within Functional Limits for tasks assessed                                     General Comments       Exercises     Shoulder Instructions      Home Living Family/patient expects to be discharged to:: Private residence Living Arrangements: Spouse/significant other Available Help at Discharge: Family               Bathroom Shower/Tub: Walk-in shower   Bathroom Toilet: Handicapped height     Home Equipment: Environmental consultant - 2 wheels;Cane - single point          Prior Functioning/Environment  Level of Independence: Independent                 OT Problem List: Decreased activity tolerance;Pain;Decreased knowledge of use of DME or AE      OT Treatment/Interventions: Self-care/ADL training;DME and/or AE instruction;Patient/family education    OT Goals(Current goals can be found in the care plan section) Acute Rehab OT Goals Patient Stated Goal: Regain IND OT Goal Formulation: With patient Time For Goal Achievement: 03/18/17 Potential to Achieve Goals: Good ADL Goals Pt Will Perform Tub/Shower Transfer: Shower transfer;with min guard assist;ambulating;shower seat  OT Frequency: Min 2X/week   Barriers to D/C:            Co-evaluation              AM-PAC PT "6 Clicks" Daily Activity      Outcome Measure Help from another person eating meals?: None Help from another person taking care of personal grooming?: A Little Help from another person toileting, which includes using toliet, bedpan, or urinal?: A Little Help from another person bathing (including washing, rinsing, drying)?: A Little Help from another person to put on and taking off regular upper body clothing?: A Little Help from another person to put on and taking off regular lower body clothing?: A Little 6 Click Score: 19   End of Session    Activity Tolerance: Patient tolerated treatment well Patient left: in chair;with call bell/phone within reach  OT Visit Diagnosis: Pain Pain - Right/Left: Right Pain - part of body: Knee                Time: 9476-5465 OT Time Calculation (min): 21 min Charges:  OT General Charges $OT Visit: 1 Visit OT Evaluation $OT Eval Low Complexity: 1 Low G-Codes:     [place], OTR/L [phone] 03/15/2017  Tayo Maute 03/15/2017, 10:17 AM

## 2017-03-16 LAB — CBC
HEMATOCRIT: 25.1 % — AB (ref 36.0–46.0)
Hemoglobin: 8.9 g/dL — ABNORMAL LOW (ref 12.0–15.0)
MCH: 33.6 pg (ref 26.0–34.0)
MCHC: 35.5 g/dL (ref 30.0–36.0)
MCV: 94.7 fL (ref 78.0–100.0)
Platelets: 176 10*3/uL (ref 150–400)
RBC: 2.65 MIL/uL — ABNORMAL LOW (ref 3.87–5.11)
RDW: 12.3 % (ref 11.5–15.5)
WBC: 7.7 10*3/uL (ref 4.0–10.5)

## 2017-03-16 NOTE — Progress Notes (Signed)
Occupational Therapy Treatment Patient Details Name: MONTY MCCARRELL MRN: [id number] DOB: Dec 17, 1940 Today's Date: 03/16/2017    History of present illness Pt s/p R TKR and with hx of syncope   OT comments  All education completed this session  Follow Up Recommendations  No OT follow up;Supervision/Assistance - 24 hour    Equipment Recommendations  None recommended by OT    Recommendations for Other Services      Precautions / Restrictions Precautions Precautions: Knee;Fall Restrictions Weight Bearing Restrictions: No Other Position/Activity Restrictions: WBAT       Mobility Bed Mobility Overal bed mobility: Needs Assistance Bed Mobility: Supine to Sit     Supine to sit: Supervision     General bed mobility comments: oob  Transfers Overall transfer level: Needs assistance Equipment used: Rolling walker (2 wheeled) Transfers: Sit to/from Stand Sit to Stand: Supervision         General transfer comment: cues for use of UEs to self assist    Balance                                           ADL either performed or assessed with clinical judgement   ADL                           Toilet Transfer: Supervision/safety;Ambulation       Tub/ Shower Transfer: Walk-in shower;Min guard;Ambulation     General ADL Comments: practiced shower transfer, simulating ledge due to accessible shower stall in hospital.  Recommended use of seat in shower for first few showers.  Pt may have an old 3:1; reminded her legs need to be wiped down if used to minimize rusting. Also educated that some people use a resin outdoor chair, but chair needs to be stabilized in shower to make it safe     Vision       Perception     Praxis      Cognition Arousal/Alertness: Awake/alert Behavior During Therapy: WFL for tasks assessed/performed Overall Cognitive Status: Within Functional Limits for tasks assessed                                           Exercises    Shoulder Instructions       General Comments      Pertinent Vitals/ Pain       Pain Assessment: 0-10 Pain Score: 4  Pain Location: R knee Pain Descriptors / Indicators: Sore Pain Intervention(s): Limited activity within patient's tolerance;Monitored during session;Premedicated before session;Repositioned;Ice applied  Home Living                                          Prior Functioning/Environment              Frequency           Progress Toward Goals  OT Goals(current goals can now be found in the care plan section)  Progress towards OT goals: Progressing toward goals  Acute Rehab OT Goals Patient Stated Goal: Regain IND  Plan      Co-evaluation  AM-PAC PT "6 Clicks" Daily Activity     Outcome Measure   Help from another person eating meals?: None Help from another person taking care of personal grooming?: A Little Help from another person toileting, which includes using toliet, bedpan, or urinal?: A Little Help from another person bathing (including washing, rinsing, drying)?: A Little Help from another person to put on and taking off regular upper body clothing?: A Little Help from another person to put on and taking off regular lower body clothing?: A Little 6 Click Score: 19    End of Session    OT Visit Diagnosis: Pain Pain - Right/Left: Right Pain - part of body: Knee   Activity Tolerance Patient tolerated treatment well   Patient Left in chair;with call bell/phone within reach   Nurse Communication          Time: 6283-6629 OT Time Calculation (min): 11 min  Charges: OT General Charges $OT Visit: 1 Visit OT Treatments $Self Care/Home Management : 8-22 mins  Lesle Chris, OTR/L [phone] 03/16/2017   Raechal Raben 03/16/2017, 12:09 PM

## 2017-03-16 NOTE — Discharge Summary (Signed)
Physician Discharge Summary  Patient ID: [name] MRN: [id number] DOB/AGE: [birth year] 77 y.o.  Admit date: 03/14/2017 Discharge date: 03/16/2017 03/16/17  Procedures:  Procedure(s) (LRB): RIGHT TOTAL KNEE ARTHROPLASTY WITH COMPUTER NAVIGATION AND REMOVAL OF HARDWARE (Right)  Attending Physician:  Dr. Rod Salinas  Admission Diagnoses:   Right knee end stage osteoarthritis  Discharge Diagnoses:  Principal Problem:   Post-traumatic osteoarthritis of right knee  Past Medical History:  Diagnosis Date  . Arthritis   . Diverticulosis of colon   . Essential hypertension   . History of basal cell carcinoma (BCC) excision    02-10-2013  left cheek  s/p  moh's sx  . History of palpitations    cardiolgoist-  dr [name]  . History of recurrent UTIs   . History of syncope    2015-- felt to vasovagal response to pain (per cardiologist note)  . IBS (irritable bowel syndrome)   . Open wound of left knee    warm soaks daily /  neosprin and dressing daily / per per still has drainage  . Patellar bursitis of left knee    pre-patella septic bursitis w/ foreign body  . Right thyroid nodule    x2 right side incidental finding on carotid duplex 03/ 2018-- thyroid ultrasound done , benign , follow-up in one year  . Wears hearing aid in both ears     HPI:    Pt admitted for worsening pain in right knee due to end stage osteoarthritis.   PCP: Melissa Kirschner, MD   Discharged Condition: good  Hospital Course:  Patient underwent the above stated procedure on 03/14/2017. Patient tolerated the procedure well and brought to the recovery room in good condition and subsequently to the floor.   Disposition: 01-Home or Self Care with follow up in 2 weeks   Follow-up Information    [name], Melissa Edelman, MD. Schedule an appointment as soon as possible for a visit in 2 week(s).   Specialty:  Orthopedic Surgery Why:  For wound re-check Contact information: [address] [place] [address] [phone]           Discharge Instructions    Call MD / Call 911   Complete by:  As directed    If you experience chest pain or shortness of breath, CALL 911 and be transported to the hospital emergency room.  If you develope a fever above 101 F, pus (white drainage) or increased drainage or redness at the wound, or calf pain, call your surgeon's office.   Constipation Prevention   Complete by:  As directed    Drink plenty of fluids.  Prune juice may be helpful.  You may use a stool softener, such as Colace (over the counter) 100 mg twice a day.  Use MiraLax (over the counter) for constipation as needed.   Diet - low sodium heart healthy   Complete by:  As directed    Increase activity slowly as tolerated   Complete by:  As directed       Allergies as of 03/16/2017      Reactions   Codeine Other (See Comments)   REACTION: syncope   Hydrocodone Nausea Only   Dizzy, syncope   Penicillins Rash   Has patient had a PCN reaction causing immediate rash, facial/tongue/throat swelling, SOB or lightheadedness with hypotension: Yes Has patient had a PCN reaction causing severe rash involving mucus membranes or skin necrosis: No Has patient had a PCN reaction that required hospitalization: No Has  patient had a PCN reaction occurring within the last 10 years: No If all of the above answers are "NO", then may proceed with Cephalosporin use.      Medication List    TAKE these medications   acetaminophen 500 MG tablet Commonly known as:  TYLENOL Take 500 mg by mouth daily as needed for mild pain. Take with IBU   aspirin 81 MG tablet Take 1 tablet (81 mg total) by mouth 2 (two) times daily after a meal.   CENTRUM SILVER tablet Take 1 tablet by mouth daily.   docusate sodium 100 MG capsule Commonly known as:  COLACE Take 1 capsule (100 mg total) by mouth 2 (two) times daily.   estradiol 0.1 MG/GM vaginal cream Commonly known as:  ESTRACE Place 2 g vaginally daily as  needed (lubrication).   FLAX PO Take 1 Dose by mouth every morning.   HYDROmorphone 2 MG tablet Commonly known as:  DILAUDID Take 0.5-1 tablets (1-2 mg total) by mouth every 4 (four) hours as needed (knee pain).   ibuprofen 200 MG tablet Commonly known as:  ADVIL,MOTRIN Take 400 mg by mouth daily as needed for moderate pain. Take with Tylenol   losartan 50 MG tablet Commonly known as:  COZAAR TAKE 1 TABLET BY MOUTH EVERY MORNING.   METAMUCIL PO Take 1 Scoop by mouth every evening.   metoprolol tartrate 50 MG tablet Commonly known as:  LOPRESSOR TAKE (1) TABLET TWICE DAILY.   Omega 3 1000 MG Caps Take 1,000 mg by mouth at bedtime.   ondansetron 4 MG tablet Commonly known as:  ZOFRAN Take 1 tablet (4 mg total) by mouth every 8 (eight) hours as needed for nausea or vomiting.   PROBIOTIC DAILY PO Take 1 tablet by mouth daily.   senna 8.6 MG Tabs tablet Commonly known as:  SENOKOT Take 2 tablets (17.2 mg total) by mouth at bedtime.   sodium chloride 0.65 % Soln nasal spray Commonly known as:  OCEAN Place 1 spray into both nostrils daily as needed for congestion.   SYSTANE OP Place 1 drop into the left eye 2 (two) times daily.   tobramycin-dexamethasone ophthalmic solution Commonly known as:  TOBRADEX Place 1 drop into the left eye every 6 (six) hours as needed. What changed:    when to take this  reasons to take this        [name], [place] is now [facility]  [place] [address], [address], [place], [place] [address] Phone: [phone] [email or web address] Facebook  [employer]

## 2017-03-16 NOTE — Progress Notes (Signed)
   Subjective: 2 Days Post-Op Procedure(s) (LRB): RIGHT TOTAL KNEE ARTHROPLASTY WITH COMPUTER NAVIGATION AND REMOVAL OF HARDWARE (Right)  Pt doing well Mild soreness in the knee but progressing well Denies any new symptoms or issues Patient reports pain as mild.  Objective:   VITALS:   Vitals:   03/15/17 2018 03/16/17 0526  BP: (!) 146/78 (!) 155/78  Pulse: 73 81  Resp: 13 17  Temp: 98.3 F (36.8 C) 97.9 F (36.6 C)  SpO2: 100% 100%    Right knee incision healing well Ace removed nv intact distally No rashes or edema distally  LABS Recent Labs    03/15/17 0619 03/16/17 0540  HGB 10.7* 8.9*  HCT 30.5* 25.1*  WBC 11.4* 7.7  PLT 203 176    Recent Labs    03/15/17 0619  NA 136  K 4.0  BUN 9  CREATININE 0.70  GLUCOSE 100*     Assessment/Plan: 2 Days Post-Op Procedure(s) (LRB): RIGHT TOTAL KNEE ARTHROPLASTY WITH COMPUTER NAVIGATION AND REMOVAL OF HARDWARE (Right) D/c home today after therapy  F/u in 2 weeks for recheck Therapy scheduled for Monday Pain management as needed   Merla Riches PA-C, [place] is now [employer]  [place] [address], [place], [place], [place] [address] Phone: [phone] [email or web address] Facebook  [employer]

## 2017-03-16 NOTE — Progress Notes (Signed)
Physical Therapy Treatment Patient Details Name: [name] MRN: [id number] DOB: 05-14-1940 Today's Date: 03/16/2017    History of Present Illness Pt s/p R TKR and with hx of syncope    PT Comments    Pt progressing well with mobility and eager for dc home.  Reviewed stairs and home therex with written instruction provided.   Follow Up Recommendations  DC plan and follow up therapy as arranged by surgeon     Equipment Recommendations  None recommended by PT    Recommendations for Other Services OT consult     Precautions / Restrictions Precautions Precautions: Knee;Fall Restrictions Weight Bearing Restrictions: No Other Position/Activity Restrictions: WBAT    Mobility  Bed Mobility Overal bed mobility: Needs Assistance Bed Mobility: Supine to Sit     Supine to sit: Supervision        Transfers Overall transfer level: Needs assistance Equipment used: Rolling walker (2 wheeled) Transfers: Sit to/from Stand Sit to Stand: Supervision;Min guard         General transfer comment: cues for use of UEs to self assist  Ambulation/Gait Ambulation/Gait assistance: Min guard;Supervision Ambulation Distance (Feet): 200 Feet(and 15' to bathroom) Assistive device: Rolling walker (2 wheeled) Gait Pattern/deviations: Step-to pattern;Decreased step length - right;Decreased step length - left;Shuffle;Trunk flexed Gait velocity: decr Gait velocity interpretation: Below normal speed for age/gender General Gait Details: cues for sequence, posture and position from RW   Stairs Stairs: Yes   Stair Management: No rails;Step to pattern;Backwards;Forwards;With walker Number of Stairs: 2 General stair comments: single step twice with RW - once fwd and once bkwd  Wheelchair Mobility    Modified Rankin (Stroke Patients Only)       Balance                                            Cognition Arousal/Alertness: Awake/alert Behavior During Therapy:  WFL for tasks assessed/performed Overall Cognitive Status: Within Functional Limits for tasks assessed                                        Exercises Total Joint Exercises Ankle Circles/Pumps: AROM;Both;15 reps;Supine Quad Sets: AROM;Both;10 reps;Supine Heel Slides: AAROM;Right;Supine;20 reps Straight Leg Raises: AAROM;Right;Supine;20 reps Knee Flexion: AROM;AAROM;Right;10 reps;Seated Goniometric ROM: AAROM R knee -10 - 60    General Comments        Pertinent Vitals/Pain Pain Assessment: 0-10 Pain Score: 4  Pain Location: R knee Pain Descriptors / Indicators: Aching;Sore Pain Intervention(s): Limited activity within patient's tolerance;Monitored during session;Premedicated before session;Ice applied    Home Living                      Prior Function            PT Goals (current goals can now be found in the care plan section) Acute Rehab PT Goals Patient Stated Goal: Regain IND PT Goal Formulation: With patient Time For Goal Achievement: 03/21/17 Potential to Achieve Goals: Good Progress towards PT goals: Progressing toward goals    Frequency    7X/week      PT Plan Current plan remains appropriate    Co-evaluation              AM-PAC PT "6 Clicks" Daily Activity  Outcome Measure  Difficulty turning  over in bed (including adjusting bedclothes, sheets and blankets)?: A Lot Difficulty moving from lying on back to sitting on the side of the bed? : A Lot Difficulty sitting down on and standing up from a chair with arms (e.g., wheelchair, bedside commode, etc,.)?: A Lot Help needed moving to and from a bed to chair (including a wheelchair)?: A Little Help needed walking in hospital room?: A Little Help needed climbing 3-5 steps with a railing? : A Little 6 Click Score: 15    End of Session Equipment Utilized During Treatment: Gait belt Activity Tolerance: Patient tolerated treatment well Patient left: in chair;with call  bell/phone within reach Nurse Communication: Mobility status PT Visit Diagnosis: Difficulty in walking, not elsewhere classified (R26.2)     Time: 3361-2244 PT Time Calculation (min) (ACUTE ONLY): 42 min  Charges:                       G Codes:       Pg [phone]    Lindel Marcell 03/16/2017, 10:55 AM

## 2017-03-16 NOTE — Progress Notes (Signed)
Discharge Planning:NCM spoke to pt and states she has been arranged for outpt PT at [facility]. She has RW and 3n1 bedside commode at home. Husband will be at home to assist in care. Jonnie Finner RN CCM Case Mgmt phone [phone]

## 2017-03-16 NOTE — Progress Notes (Signed)
Discharged from floor via w/c for transport home by car. Belongings & spouse with pt. No changes in assessment. [name]  

## 2017-03-18 ENCOUNTER — Ambulatory Visit (HOSPITAL_COMMUNITY): Payer: Medicare Other | Attending: Orthopedic Surgery

## 2017-03-18 ENCOUNTER — Encounter (HOSPITAL_COMMUNITY): Payer: Self-pay

## 2017-03-18 DIAGNOSIS — M25661 Stiffness of right knee, not elsewhere classified: Secondary | ICD-10-CM | POA: Diagnosis not present

## 2017-03-18 DIAGNOSIS — R600 Localized edema: Secondary | ICD-10-CM | POA: Insufficient documentation

## 2017-03-18 DIAGNOSIS — M6281 Muscle weakness (generalized): Secondary | ICD-10-CM | POA: Insufficient documentation

## 2017-03-18 DIAGNOSIS — R262 Difficulty in walking, not elsewhere classified: Secondary | ICD-10-CM | POA: Diagnosis not present

## 2017-03-18 NOTE — Patient Instructions (Signed)
  QUAD SET  Tighten your top thigh muscle as you attempt to press the back of your knee downward towards the table.  Perform 1-2x/day, 2-3 sets of 10-15 reps, holding for 3-5 seconds at the top.   HEEL SLIDES - LONG SIT WITH TOWEL AND BELT  While in a sitting position or lying down, place a small hand towel under your heel. Next, loop a belt, towel or bed sheet around your foot and pull your knee into a bend position as your foot slides towards your buttock. Hold a gentle stretch and then return back to original position.  Perform 1-2x/day, 2-3 sets of 10-15 reps, holding for 3-5 seconds at the top.   Short Arc Quads (SAQs)  While lying face up, place two rolled towels under the knee to lift it about 5 inches off the mat. Bring your toes towards your head (dorsiflexion)then straighten the knee for a hold of 3-5 seconds. Slowly lower your leg back to the mat.   Perform 1-2x/day, 2-3 sets of 10-15 reps, holding for 3-5 seconds at the top.

## 2017-03-18 NOTE — Therapy (Signed)
[facility] [address] [place], [place], [address] Phone: [phone]   Fax:  [phone]  Physical Therapy Evaluation  Patient Details  Name: [name] MRN: [id number] Date of Birth: Jan 20, 1941 Referring Provider: Rod Can, MD   Encounter Date: 03/18/2017  PT End of Session - 03/18/17 1512    Visit Number  1    Number of Visits  19    Date for PT Re-Evaluation  04/08/17    Authorization Type  [employer] Medicare    Authorization Time Period  03/18/17 to 04/29/17    PT Start Time  1426    PT Stop Time  1510    PT Time Calculation (min)  44 min    Equipment Utilized During Treatment  Gait belt    Activity Tolerance  Patient tolerated treatment well    Behavior During Therapy  [facility] for tasks assessed/performed       Past Medical History:  Diagnosis Date  . Arthritis   . Diverticulosis of colon   . Essential hypertension   . History of basal cell carcinoma (BCC) excision    02-10-2013  left cheek  s/p  moh's sx  . History of palpitations    cardiolgoist-  dr Domenic Polite  . History of recurrent UTIs   . History of syncope    2015-- felt to vasovagal response to pain (per cardiologist note)  . IBS (irritable bowel syndrome)   . Open wound of left knee    warm soaks daily /  neosprin and dressing daily / per per still has drainage  . Patellar bursitis of left knee    pre-patella septic bursitis w/ foreign body  . Right thyroid nodule    x2 right side incidental finding on carotid duplex 03/ 2018-- thyroid ultrasound done , benign , follow-up in one year  . Wears hearing aid in both ears     Past Surgical History:  Procedure Laterality Date  . APPENDECTOMY  age 14  . BREAST BIOPSY  1962   benign  . CATARACT EXTRACTION W/PHACO  06/25/2011   Procedure: CATARACT EXTRACTION PHACO AND INTRAOCULAR LENS PLACEMENT (IOC);  Surgeon: Williams Che, MD;  Location: [facility];  Service: Ophthalmology;  Laterality: Right;  CDE: 8.90  .  CATARACT EXTRACTION W/PHACO Left 05/05/2012   Procedure: CATARACT EXTRACTION PHACO AND INTRAOCULAR LENS PLACEMENT (IOC);  Surgeon: Williams Che, MD;  Location: [facility];  Service: Ophthalmology;  Laterality: Left;  CDE 12.78  . COLONOSCOPY  last one 11-26-2012  . EXCISION MORTON'S NEUROMA Right 2003  approx.  . INCISION AND DRAINAGE WOUND WITH FOREIGN BODY REMOVAL Left 08/30/2016   Procedure: LEFT KNEE INCISION AND DRAINAGE WOUND WITH FOREIGN BODY REMOVAL;  Surgeon: Sydnee Cabal, MD;  Location: [facility];  Service: Orthopedics;  Laterality: Left;  . KNEE ARTHROPLASTY Right 03/14/2017   Procedure: RIGHT TOTAL KNEE ARTHROPLASTY WITH COMPUTER NAVIGATION AND REMOVAL OF HARDWARE;  Surgeon: Rod Can, MD;  Location: [facility];  Service: Orthopedics;  Laterality: Right;  Adductor Block  . KNEE ARTHROSCOPY W/ ACL RECONSTRUCTION Right 1996  . KNEE CARTILAGE SURGERY Right age 86  . MOHS SURGERY  02/10/2013   left cheek -- BCC  . POSTERIOR REPAIR  05-21-2002   dr Glo Herring   symptomatic recetocele  . TONSILLECTOMY AND ADENOIDECTOMY  age 72  . TRANSTHORACIC ECHOCARDIOGRAM  04-17-2016  dr Domenic Polite   ef 60-65%/  trivial MR/ mild TR  . VAGINAL HYSTERECTOMY  1979   w/  Bilateral Salpingoophorectomy    There were no vitals filed for this visit.   Subjective Assessment - 03/18/17 1432    Subjective  Pt states that she underwent R TKR by Dr. Lyla Glassing on 03/14/17. She states that she has had pain in her knee for years. She is currently on a RW but prior to her surgery she was not using any AD. She states that when she starts to move, her knee pain will increase and her muscles are very sore. She's also having some burning at the medial knee. She did not have HHPT. She states that long term, she would like to get back to skiing.     Limitations  Sitting;Walking;House hold activities    How long can you sit comfortably?  depends on the chair    How long can you stand comfortably?  hasn't done  much standing yet    How long can you walk comfortably?  just around the house    Patient Stated Goals  to be the best that I can; get back to as close to where I was prior    Currently in Pain?  No/denies         [facility] PT Assessment - 03/18/17 0001      Assessment   Medical Diagnosis  R TKR    Referring Provider  Rod Can, MD    Onset Date/Surgical Date  03/14/17    Next MD Visit  03/27/17    Prior Therapy  none for TKR      Balance Screen   Has the patient fallen in the past 6 months  No    Has the patient had a decrease in activity level because of a fear of falling?   No    Is the patient reluctant to leave their home because of a fear of falling?   No      Prior Function   Level of Independence  Independent    Vocation  Retired    Leisure  outdoors, exercising, yoga      Observation/Other Assessments-Edema    Edema  Circumferential      Circumferential Edema   Circumferential - Right  14.25" joint line    Circumferential - Left   12" joint line      Sensation   Light Touch  Appears Intact      ROM / Strength   AROM / PROM / Strength  AROM;Strength      AROM   AROM Assessment Site  Knee    Right/Left Knee  Right    Right Knee Extension  14    Right Knee Flexion  91      Strength   Strength Assessment Site  Hip;Knee;Ankle    Right Hip Flexion  4-/5    Right Hip Extension  4-/5    Right Hip ABduction  4/5    Left Hip Flexion  4+/5    Left Hip Extension  4-/5    Left Hip ABduction  4+/5    Right Knee Flexion  4+/5    Right Knee Extension  4+/5    Left Knee Flexion  5/5    Left Knee Extension  5/5    Right Ankle Dorsiflexion  4+/5    Left Ankle Dorsiflexion  4/5      Palpation   Patella mobility  sup/inf hypomobile    Palpation comment  increased tenderness to palpation of distal VMO and medial joint line      Ambulation/Gait  Ambulation Distance (Feet)  402 Feet 3MWT    Assistive device  Rolling walker    Gait Pattern  Step-through  pattern;Decreased stance time - right;Decreased hip/knee flexion - right;Right circumduction;Antalgic;Trendelenburg    Gait Comments  decre R hip ext, increased R knee flex at heel strike, decr knee flex at swing through      Balance   Balance Assessed  Yes      Static Standing Balance   Static Standing - Balance Support  No upper extremity supported    Static Standing Balance -  Activities   Single Leg Stance - Right Leg;Single Leg Stance - Left Leg    Static Standing - Comment/# of Minutes  R: 30 sec L: 30 sec      Standardized Balance Assessment   Standardized Balance Assessment  Five Times Sit to Stand;Timed Up and Go Test    Five times sit to stand comments   13 sec, chair, no UE      Timed Up and Go Test   Normal TUG (seconds)  16.3 RW          Objective measurements completed on examination: See above findings.      PT Education - 03/18/17 1506    Education provided  Yes    Education Details  exam findings, POC, HEP    Person(s) Educated  Patient;Spouse    Methods  Explanation;Demonstration;Handout    Comprehension  Verbalized understanding;Returned demonstration       PT Short Term Goals - 03/18/17 1613      PT SHORT TERM GOAL #1   Title  Pt will be independent with HEP and perform consistently in order to maximize retrun to PLOF.    Time  3    Period  Weeks    Status  New    Target Date  04/08/17      PT SHORT TERM GOAL #2   Title  Pt will have improved knee AROM from 8-110deg to maximize gait and decrease pain.    Time  3    Period  Weeks    Status  New      PT SHORT TERM GOAL #3   Title  Pt will have decreased R knee joint line edema by 2" or > to decrease pain, maximize ROM, and maximize gait.    Time  3    Period  Weeks    Status  New        PT Long Term Goals - 03/18/17 1615      PT LONG TERM GOAL #1   Title  Pt will have imrpoved ROM to 5-120deg or better to maximize sitting tolerance and stair ambulation.    Time  6    Period  Weeks     Status  New    Target Date  04/29/17      PT LONG TERM GOAL #2   Title  Pt will have at least 4+/5 or > MMT of all muscle groups tested in order to maximize gait, functional strength, and promote return to PLOF.    Time  6    Period  Weeks    Status  New      PT LONG TERM GOAL #3   Title  Pt will be able to perform the TUG in 10 sec or < with LRAD to demo improved functional strength and gait.    Time  6    Period  Weeks    Status  New  PT LONG TERM GOAL #4   Title  Pt will have improved 3MWT by 164ft or > with LRAD and min to no gait deviations to demo improved overall functional strength, gait, and community ambulation.    Time  6    Period  Weeks    Status  New      PT LONG TERM GOAL #5   Title  Pt will report being able to participate in her regular exercise routine and hobbies/outdoor activities without R knee pain to demo pt's return to PLOF and to maximize overall health.    Time  6    Period  Weeks    Status  New             Plan - 03/18/17 1609    Clinical Impression Statement  Pt is pleasant 77 YO F who presents to OPPT s/p R TKR on 03/14/17 by Dr. Lyla Glassing. Pt presents with post-op swelling/edema, deficits in MMT, ROM, functional strength, ambulation, and difficulty completing functional tasks. She reports that prior to her TKR, her R knee extension was always limited due to an old ACL surgery. Her AROM this date was 14-91. Pt needs skilled PT intervention to address these deficits in order to improve ROM, decrease pain, and maximize return to PLOF.    History and Personal Factors relevant to plan of care:  motivated to participate with PT, very active, healthy individual; arthritis, HTN,     Clinical Presentation  Stable    Clinical Presentation due to:  edema, ROM, MMT, gait, functional strength, 5xSTS, TUG, 3MWT    Clinical Decision Making  Low    Rehab Potential  Excellent    PT Frequency  3x / week    PT Duration  6 weeks    PT Treatment/Interventions   ADLs/Self Care Home Management;Cryotherapy;Electrical Stimulation;Moist Heat;Ultrasound;DME Instruction;Gait training;Stair training;Functional mobility training;Therapeutic activities;Therapeutic exercise;Balance training;Neuromuscular re-education;Patient/family education;Orthotic Fit/Training;Manual techniques;Scar mobilization;Passive range of motion;Dry needling;Energy conservation;Taping    PT Next Visit Plan  Review goals and HEP; initial focus on mobility and decreasing edema; initiate bike, HS and calf stretching in standing, knee drives on step, standing TKE, rockerboard, LAQ; manual for edema management and soft tissue restrictions    PT Home Exercise Plan  eval: quad sets, heel slides, SAQ    Consulted and Agree with Plan of Care  Patient       Patient will benefit from skilled therapeutic intervention in order to improve the following deficits and impairments:  Abnormal gait, Decreased activity tolerance, Decreased mobility, Decreased range of motion, Decreased strength, Difficulty walking, Hypomobility, Increased edema, Increased muscle spasms, Impaired flexibility, Pain  Visit Diagnosis: Stiffness of right knee, not elsewhere classified - Plan: PT plan of care cert/re-cert  Muscle weakness (generalized) - Plan: PT plan of care cert/re-cert  Localized edema - Plan: PT plan of care cert/re-cert  Difficulty in walking, not elsewhere classified - Plan: PT plan of care cert/re-cert     Problem List Patient Active Problem List   Diagnosis Date Noted  . Post-traumatic osteoarthritis of right knee 03/14/2017  . S/P knee surgery 08/30/2016  . History of recurrent UTIs 09/19/2015  . Osteopenia 05/21/2015  . Osteoarthritis of right hip 05/21/2015  . Arrhythmia, long term 02/15/2014  . Esophageal reflux 02/15/2014  . Chronic back pain 06/01/2013  . Change in bowel habits 11/21/2012  . Diarrhea 11/21/2012      Geraldine Solar PT, DPT  [place], [place],  [place] Phone: [phone]   Fax:  [phone]  Name: [name] MRN: [id number] Date of Birth: 01-Jan-1941

## 2017-03-20 ENCOUNTER — Ambulatory Visit (HOSPITAL_COMMUNITY): Payer: Medicare Other

## 2017-03-20 DIAGNOSIS — M25661 Stiffness of right knee, not elsewhere classified: Secondary | ICD-10-CM

## 2017-03-20 DIAGNOSIS — M6281 Muscle weakness (generalized): Secondary | ICD-10-CM | POA: Diagnosis not present

## 2017-03-20 DIAGNOSIS — R262 Difficulty in walking, not elsewhere classified: Secondary | ICD-10-CM

## 2017-03-20 DIAGNOSIS — R600 Localized edema: Secondary | ICD-10-CM

## 2017-03-20 NOTE — Therapy (Signed)
[facility] [place], [place], [address] Phone: [phone]   Fax:  [phone]  Physical Therapy Treatment  Patient Details  Name: [name] MRN: [id number] Date of Birth: 1941-01-15 Referring Provider: Rod Can, MD   Encounter Date: 03/20/2017  PT End of Session - 03/20/17 1359    Visit Number  2    Number of Visits  19    Date for PT Re-Evaluation  04/08/17    Authorization Type  [employer] Medicare    Authorization Time Period  03/18/17 to 04/29/17    PT Start Time  1347    PT Stop Time  1435    PT Time Calculation (min)  48 min    Equipment Utilized During Treatment  Gait belt    Activity Tolerance  Patient tolerated treatment well    Behavior During Therapy  [facility] for tasks assessed/performed       Past Medical History:  Diagnosis Date  . Arthritis   . Diverticulosis of colon   . Essential hypertension   . History of basal cell carcinoma (BCC) excision    02-10-2013  left cheek  s/p  moh's sx  . History of palpitations    cardiolgoist-  dr Domenic Polite  . History of recurrent UTIs   . History of syncope    2015-- felt to vasovagal response to pain (per cardiologist note)  . IBS (irritable bowel syndrome)   . Open wound of left knee    warm soaks daily /  neosprin and dressing daily / per per still has drainage  . Patellar bursitis of left knee    pre-patella septic bursitis w/ foreign body  . Right thyroid nodule    x2 right side incidental finding on carotid duplex 03/ 2018-- thyroid ultrasound done , benign , follow-up in one year  . Wears hearing aid in both ears     Past Surgical History:  Procedure Laterality Date  . APPENDECTOMY  age 15  . BREAST BIOPSY  1962   benign  . CATARACT EXTRACTION W/PHACO  06/25/2011   Procedure: CATARACT EXTRACTION PHACO AND INTRAOCULAR LENS PLACEMENT (IOC);  Surgeon: Williams Che, MD;  Location: [facility];  Service: Ophthalmology;  Laterality: Right;  CDE: 8.90  .  CATARACT EXTRACTION W/PHACO Left 05/05/2012   Procedure: CATARACT EXTRACTION PHACO AND INTRAOCULAR LENS PLACEMENT (IOC);  Surgeon: Williams Che, MD;  Location: [facility];  Service: Ophthalmology;  Laterality: Left;  CDE 12.78  . COLONOSCOPY  last one 11-26-2012  . EXCISION MORTON'S NEUROMA Right 2003  approx.  . INCISION AND DRAINAGE WOUND WITH FOREIGN BODY REMOVAL Left 08/30/2016   Procedure: LEFT KNEE INCISION AND DRAINAGE WOUND WITH FOREIGN BODY REMOVAL;  Surgeon: Sydnee Cabal, MD;  Location: [facility];  Service: Orthopedics;  Laterality: Left;  . KNEE ARTHROPLASTY Right 03/14/2017   Procedure: RIGHT TOTAL KNEE ARTHROPLASTY WITH COMPUTER NAVIGATION AND REMOVAL OF HARDWARE;  Surgeon: Rod Can, MD;  Location: [facility];  Service: Orthopedics;  Laterality: Right;  Adductor Block  . KNEE ARTHROSCOPY W/ ACL RECONSTRUCTION Right 1996  . KNEE CARTILAGE SURGERY Right age 39  . MOHS SURGERY  02/10/2013   left cheek -- BCC  . POSTERIOR REPAIR  05-21-2002   dr Glo Herring   symptomatic recetocele  . TONSILLECTOMY AND ADENOIDECTOMY  age 63  . TRANSTHORACIC ECHOCARDIOGRAM  04-17-2016  dr Domenic Polite   ef 60-65%/  trivial MR/ mild TR  . VAGINAL HYSTERECTOMY  1979   w/  Bilateral Salpingoophorectomy    There were no vitals filed for this visit.  Subjective Assessment - 03/20/17 1359    Subjective  Pt presents to therapy and states that she was very sore following Monday's evaluation. She is feeling better today but yesterday was rough.     Limitations  Sitting;Walking;House hold activities    How long can you sit comfortably?  depends on the chair    How long can you stand comfortably?  hasn't done much standing yet    How long can you walk comfortably?  just around the house    Patient Stated Goals  to be the best that I can; get back to as close to where I was prior    Currently in Pain?  Yes    Pain Score  1     Pain Location  Knee    Pain Orientation  Right    Pain Descriptors  / Indicators  Sore    Pain Type  Surgical pain    Pain Onset  In the past 7 days    Pain Frequency  Intermittent    Aggravating Factors   WB    Pain Relieving Factors  rest    Effect of Pain on Daily Activities  increases           [facility] Adult PT Treatment/Exercise - 03/20/17 0001      Exercises   Exercises  Knee/Hip      Knee/Hip Exercises: Stretches   Passive Hamstring Stretch  Right;3 reps;30 seconds    Passive Hamstring Stretch Limitations  supine with rope    Knee: Self-Stretch to increase Flexion  Right    Knee: Self-Stretch Limitations  10x5-10" holds    Press photographer  Both;3 reps;30 seconds    Gastroc Stretch Limitations  slant board      Knee/Hip Exercises: Aerobic   Stationary Bike  x4 mins at beginning of session, seat 9, 1/2 revolutions      Knee/Hip Exercises: Seated   Long Arc Quad  Right;10 reps      Knee/Hip Exercises: Supine   Quad Sets  Right;10 reps    [employer] Limitations  3" holds    Short Arc [employer]  Right;10 reps    Short Arc Quad Sets Limitations  3" holds    Heel Slides  Right;10 reps    Heel Slides Limitations  3" holds    Knee Extension Limitations  12    Knee Flexion Limitations  77      Manual Therapy   Manual Therapy  Edema management    Manual therapy comments  completed separate rest of treatment    Edema Management  retro massage with BLE elevated           PT Education - 03/20/17 1447    Education provided  Yes    Education Details  reviewed initial goals    Person(s) Educated  Patient    Methods  Explanation;Handout    Comprehension  Verbalized understanding;Returned demonstration       PT Short Term Goals - 03/18/17 1613      PT SHORT TERM GOAL #1   Title  Pt will be independent with HEP and perform consistently in order to maximize retrun to PLOF.    Time  3    Period  Weeks    Status  New    Target Date  04/08/17      PT SHORT TERM GOAL #2   Title  Pt will have  improved knee AROM from 8-110deg to  maximize gait and decrease pain.    Time  3    Period  Weeks    Status  New      PT SHORT TERM GOAL #3   Title  Pt will have decreased R knee joint line edema by 2" or > to decrease pain, maximize ROM, and maximize gait.    Time  3    Period  Weeks    Status  New        PT Long Term Goals - 03/18/17 1615      PT LONG TERM GOAL #1   Title  Pt will have imrpoved ROM to 5-120deg or better to maximize sitting tolerance and stair ambulation.    Time  6    Period  Weeks    Status  New    Target Date  04/29/17      PT LONG TERM GOAL #2   Title  Pt will have at least 4+/5 or > MMT of all muscle groups tested in order to maximize gait, functional strength, and promote return to PLOF.    Time  6    Period  Weeks    Status  New      PT LONG TERM GOAL #3   Title  Pt will be able to perform the TUG in 10 sec or < with LRAD to demo improved functional strength and gait.    Time  6    Period  Weeks    Status  New      PT LONG TERM GOAL #4   Title  Pt will have improved 3MWT by 1103ft or > with LRAD and min to no gait deviations to demo improved overall functional strength, gait, and community ambulation.    Time  6    Period  Weeks    Status  New      PT LONG TERM GOAL #5   Title  Pt will report being able to participate in her regular exercise routine and hobbies/outdoor activities without R knee pain to demo pt's return to PLOF and to maximize overall health.    Time  6    Period  Weeks    Status  New            Plan - 03/20/17 1447    Clinical Impression Statement  Pt presented to therapy today with reports of increased pain and soreness following evaluation on Monday. Pt stating that she pushed herself really hard Monday and thinks she overdid it; she voiced concern regarding pushing herself too much as she did not want to cause any regression in her therapy. PT explained that she is still in the acute phase of healing as she is 6 days post-op and educated her that  increased soreness is not uncommon. PT explained that as her swelling and ROM improve, her pain will decrease and she verbalized understanding. Reviewed initial goals and issued copy of eval with no f/u questions. Session focused on mobility and decreasing edema. See how pt responded to this session and make adjustments accordingly. Ended with edema management and pt reported that it felt great. AROM 12-77 this date (was 14-91 at eval). Slowly progress pt within pain tolerance.    Rehab Potential  Excellent    PT Frequency  3x / week    PT Duration  6 weeks    PT Treatment/Interventions  ADLs/Self Care Home Management;Cryotherapy;Electrical Stimulation;Moist Heat;Ultrasound;DME Instruction;Gait training;Stair training;Functional mobility training;Therapeutic activities;Therapeutic exercise;Balance  training;Neuromuscular re-education;Patient/family education;Orthotic Fit/Training;Manual techniques;Scar mobilization;Passive range of motion;Dry needling;Energy conservation;Taping    PT Next Visit Plan  initial focus on mobility and decreasing edema; manual for edema management and soft tissue restrictions    PT Home Exercise Plan  eval: quad sets, heel slides, SAQ    Consulted and Agree with Plan of Care  Patient       Patient will benefit from skilled therapeutic intervention in order to improve the following deficits and impairments:  Abnormal gait, Decreased activity tolerance, Decreased mobility, Decreased range of motion, Decreased strength, Difficulty walking, Hypomobility, Increased edema, Increased muscle spasms, Impaired flexibility, Pain  Visit Diagnosis: Stiffness of right knee, not elsewhere classified  Muscle weakness (generalized)  Localized edema  Difficulty in walking, not elsewhere classified     Problem List Patient Active Problem List   Diagnosis Date Noted  . Post-traumatic osteoarthritis of right knee 03/14/2017  . S/P knee surgery 08/30/2016  . History of recurrent  UTIs 09/19/2015  . Osteopenia 05/21/2015  . Osteoarthritis of right hip 05/21/2015  . Arrhythmia, long term 02/15/2014  . Esophageal reflux 02/15/2014  . Chronic back pain 06/01/2013  . Change in bowel habits 11/21/2012  . Diarrhea 11/21/2012      Geraldine Solar PT, DPT  [facility] [address] [place], [place], [address] Phone: [phone]   Fax:  [phone]  Name: [name] MRN: [id number] Date of Birth: 06/08/1940

## 2017-03-22 ENCOUNTER — Encounter (HOSPITAL_COMMUNITY): Payer: Self-pay

## 2017-03-22 ENCOUNTER — Ambulatory Visit (HOSPITAL_COMMUNITY): Payer: Medicare Other

## 2017-03-22 DIAGNOSIS — R600 Localized edema: Secondary | ICD-10-CM

## 2017-03-22 DIAGNOSIS — M25661 Stiffness of right knee, not elsewhere classified: Secondary | ICD-10-CM | POA: Diagnosis not present

## 2017-03-22 DIAGNOSIS — R262 Difficulty in walking, not elsewhere classified: Secondary | ICD-10-CM | POA: Diagnosis not present

## 2017-03-22 DIAGNOSIS — M6281 Muscle weakness (generalized): Secondary | ICD-10-CM

## 2017-03-22 NOTE — Therapy (Signed)
[facility] [place], [place], [address] Phone: [phone]   Fax:  [phone]  Physical Therapy Treatment  Patient Details  Name: [name] MRN: [id number] Date of Birth: [birth year] Referring Provider: Rod Can, MD   Encounter Date: 03/22/2017  PT End of Session - 03/22/17 1013    Visit Number  3    Number of Visits  19    Date for PT Re-Evaluation  04/08/17    Authorization Type  [employer] Medicare    Authorization Time Period  03/18/17 to 04/29/17    PT Start Time  0946 30min on bike, no charge    PT Stop Time  1030    PT Time Calculation (min)  44 min    Activity Tolerance  Patient tolerated treatment well    Behavior During Therapy  [facility] for tasks assessed/performed       Past Medical History:  Diagnosis Date  . Arthritis   . Diverticulosis of colon   . Essential hypertension   . History of basal cell carcinoma (BCC) excision    02-10-2013  left cheek  s/p  moh's sx  . History of palpitations    cardiolgoist-  dr Domenic Polite  . History of recurrent UTIs   . History of syncope    2015-- felt to vasovagal response to pain (per cardiologist note)  . IBS (irritable bowel syndrome)   . Open wound of left knee    warm soaks daily /  neosprin and dressing daily / per per still has drainage  . Patellar bursitis of left knee    pre-patella septic bursitis w/ foreign body  . Right thyroid nodule    x2 right side incidental finding on carotid duplex 03/ 2018-- thyroid ultrasound done , benign , follow-up in one year  . Wears hearing aid in both ears     Past Surgical History:  Procedure Laterality Date  . APPENDECTOMY  age 39  . BREAST BIOPSY  1962   benign  . CATARACT EXTRACTION W/PHACO  06/25/2011   Procedure: CATARACT EXTRACTION PHACO AND INTRAOCULAR LENS PLACEMENT (IOC);  Surgeon: Williams Che, MD;  Location: [facility];  Service: Ophthalmology;  Laterality: Right;  CDE: 8.90  . CATARACT EXTRACTION W/PHACO  Left 05/05/2012   Procedure: CATARACT EXTRACTION PHACO AND INTRAOCULAR LENS PLACEMENT (IOC);  Surgeon: Williams Che, MD;  Location: [facility];  Service: Ophthalmology;  Laterality: Left;  CDE 12.78  . COLONOSCOPY  last one 11-26-2012  . EXCISION MORTON'S NEUROMA Right 2003  approx.  . INCISION AND DRAINAGE WOUND WITH FOREIGN BODY REMOVAL Left 08/30/2016   Procedure: LEFT KNEE INCISION AND DRAINAGE WOUND WITH FOREIGN BODY REMOVAL;  Surgeon: Sydnee Cabal, MD;  Location: [facility];  Service: Orthopedics;  Laterality: Left;  . KNEE ARTHROPLASTY Right 03/14/2017   Procedure: RIGHT TOTAL KNEE ARTHROPLASTY WITH COMPUTER NAVIGATION AND REMOVAL OF HARDWARE;  Surgeon: Rod Can, MD;  Location: [facility];  Service: Orthopedics;  Laterality: Right;  Adductor Block  . KNEE ARTHROSCOPY W/ ACL RECONSTRUCTION Right 1996  . KNEE CARTILAGE SURGERY Right age 55  . MOHS SURGERY  02/10/2013   left cheek -- BCC  . POSTERIOR REPAIR  05-21-2002   dr Glo Herring   symptomatic recetocele  . TONSILLECTOMY AND ADENOIDECTOMY  age 89  . TRANSTHORACIC ECHOCARDIOGRAM  04-17-2016  dr Domenic Polite   ef 60-65%/  trivial MR/ mild TR  . VAGINAL HYSTERECTOMY  1979   w/  Bilateral Salpingoophorectomy  There were no vitals filed for this visit.  Subjective Assessment - 03/22/17 0950    Subjective  Pt stated she is doing good considering recent surgery.  Knee feels tight with pain scale 2/10 today, can increased to 6-7/10 when pushes the ROM     Patient Stated Goals  to be the best that I can; get back to as close to where I was prior    Currently in Pain?  Yes    Pain Score  2     Pain Location  Knee    Pain Orientation  Right    Pain Descriptors / Indicators  Tightness;Sore    Pain Type  Surgical pain    Pain Onset  In the past 7 days    Pain Frequency  Intermittent    Aggravating Factors   WB    Pain Relieving Factors  rest    Effect of Pain on Daily Activities  increases         [facility] PT Assessment  - 03/22/17 0001      Assessment   Medical Diagnosis  R TKR    Referring Provider  Rod Can, MD    Onset Date/Surgical Date  03/14/17    Next MD Visit  03/27/17    Prior Therapy  none for TKR      AROM   AROM Assessment Site  Knee    Right/Left Knee  Right    Right Knee Extension  7    Right Knee Flexion  93                  [facility] Adult PT Treatment/Exercise - 03/22/17 0001      Knee/Hip Exercises: Stretches   Active Hamstring Stretch  3 reps;30 seconds supine      Knee/Hip Exercises: Aerobic   Stationary Bike  x4 mins at beginning of session, seat 9, 1/2 revolutions      Knee/Hip Exercises: Supine   Quad Sets  Right;10 reps    [employer] Limitations  3" holds    Short Arc [employer]  Right;15 reps    Short Arc Quad Sets Limitations  3" holds    Heel Slides  Right;10 reps    Heel Slides Limitations  3" holds    Knee Extension  AROM    Knee Extension Limitations  7    Knee Flexion  AROM    Knee Flexion Limitations  93      Manual Therapy   Manual Therapy  Edema management    Manual therapy comments  completed separate rest of treatment    Edema Management  retro massage with BLE elevated               PT Short Term Goals - 03/18/17 1613      PT SHORT TERM GOAL #1   Title  Pt will be independent with HEP and perform consistently in order to maximize retrun to PLOF.    Time  3    Period  Weeks    Status  New    Target Date  04/08/17      PT SHORT TERM GOAL #2   Title  Pt will have improved knee AROM from 8-110deg to maximize gait and decrease pain.    Time  3    Period  Weeks    Status  New      PT SHORT TERM GOAL #3   Title  Pt will have decreased R knee joint line edema by  2" or > to decrease pain, maximize ROM, and maximize gait.    Time  3    Period  Weeks    Status  New        PT Long Term Goals - 03/18/17 1615      PT LONG TERM GOAL #1   Title  Pt will have imrpoved ROM to 5-120deg or better to maximize sitting tolerance  and stair ambulation.    Time  6    Period  Weeks    Status  New    Target Date  04/29/17      PT LONG TERM GOAL #2   Title  Pt will have at least 4+/5 or > MMT of all muscle groups tested in order to maximize gait, functional strength, and promote return to PLOF.    Time  6    Period  Weeks    Status  New      PT LONG TERM GOAL #3   Title  Pt will be able to perform the TUG in 10 sec or < with LRAD to demo improved functional strength and gait.    Time  6    Period  Weeks    Status  New      PT LONG TERM GOAL #4   Title  Pt will have improved 3MWT by 186ft or > with LRAD and min to no gait deviations to demo improved overall functional strength, gait, and community ambulation.    Time  6    Period  Weeks    Status  New      PT LONG TERM GOAL #5   Title  Pt will report being able to participate in her regular exercise routine and hobbies/outdoor activities without R knee pain to demo pt's return to PLOF and to maximize overall health.    Time  6    Period  Weeks    Status  New            Plan - 03/22/17 1019    Clinical Impression Statement  Pt progressing well with therapy today with reports of minimal pain through session.  Session focus on knee mobility with manual techniques and therex.  Pt presents with minimal edema, mainly medial portion of quad.  Pt improved AROM to 7-93 degrees (was 12-77 degrees last session.  No reports of increased pain through session.      Rehab Potential  Excellent    PT Frequency  3x / week    PT Duration  6 weeks    PT Treatment/Interventions  ADLs/Self Care Home Management;Cryotherapy;Electrical Stimulation;Moist Heat;Ultrasound;DME Instruction;Gait training;Stair training;Functional mobility training;Therapeutic activities;Therapeutic exercise;Balance training;Neuromuscular re-education;Patient/family education;Orthotic Fit/Training;Manual techniques;Scar mobilization;Passive range of motion;Dry needling;Energy conservation;Taping    PT  Next Visit Plan  initial focus on mobility and decreasing edema; manual for edema management and soft tissue restrictions    PT Home Exercise Plan  eval: quad sets, heel slides, SAQ       Patient will benefit from skilled therapeutic intervention in order to improve the following deficits and impairments:  Abnormal gait, Decreased activity tolerance, Decreased mobility, Decreased range of motion, Decreased strength, Difficulty walking, Hypomobility, Increased edema, Increased muscle spasms, Impaired flexibility, Pain  Visit Diagnosis: Stiffness of right knee, not elsewhere classified  Muscle weakness (generalized)  Localized edema  Difficulty in walking, not elsewhere classified     Problem List Patient Active Problem List   Diagnosis Date Noted  . Post-traumatic osteoarthritis of right knee 03/14/2017  . S/P  knee surgery 08/30/2016  . History of recurrent UTIs 09/19/2015  . Osteopenia 05/21/2015  . Osteoarthritis of right hip 05/21/2015  . Arrhythmia, long term 02/15/2014  . Esophageal reflux 02/15/2014  . Chronic back pain 06/01/2013  . Change in bowel habits 11/21/2012  . Diarrhea 11/21/2012   Ihor Austin, [place]; [place]  Aldona Lento 03/22/2017, 11:33 AM  [facility] [address] [place], [place], [address] Phone: [phone]   Fax:  [phone]  Name: [name] MRN: [id number] Date of Birth: 06/11/1940

## 2017-03-25 ENCOUNTER — Other Ambulatory Visit: Payer: Self-pay

## 2017-03-25 ENCOUNTER — Encounter (HOSPITAL_COMMUNITY): Payer: Self-pay

## 2017-03-25 ENCOUNTER — Ambulatory Visit (HOSPITAL_COMMUNITY): Payer: Medicare Other

## 2017-03-25 DIAGNOSIS — R262 Difficulty in walking, not elsewhere classified: Secondary | ICD-10-CM

## 2017-03-25 DIAGNOSIS — M6281 Muscle weakness (generalized): Secondary | ICD-10-CM

## 2017-03-25 DIAGNOSIS — R600 Localized edema: Secondary | ICD-10-CM | POA: Diagnosis not present

## 2017-03-25 DIAGNOSIS — M25661 Stiffness of right knee, not elsewhere classified: Secondary | ICD-10-CM

## 2017-03-25 NOTE — Therapy (Signed)
[place], [place], [address] Phone: [phone]   Fax:  [phone]  Physical Therapy Treatment  Patient Details  Name: [name] MRN: [id number] Date of Birth: 20-Oct-1940 Referring Provider: Rod Can, MD   Encounter Date: 03/25/2017  PT End of Session - 03/25/17 1348    Visit Number  4    Number of Visits  19    Date for PT Re-Evaluation  04/08/17    Authorization Type  [employer] Medicare    Authorization Time Period  03/18/17 to 04/29/17    PT Start Time  4403    PT Stop Time  1431    PT Time Calculation (min)  43 min    Activity Tolerance  Patient tolerated treatment well;No increased pain    Behavior During Therapy  WFL for tasks assessed/performed       Past Medical History:  Diagnosis Date  . Arthritis   . Diverticulosis of colon   . Essential hypertension   . History of basal cell carcinoma (BCC) excision    02-10-2013  left cheek  s/p  moh's sx  . History of palpitations    cardiolgoist-  dr Domenic Polite  . History of recurrent UTIs   . History of syncope    2015-- felt to vasovagal response to pain (per cardiologist note)  . IBS (irritable bowel syndrome)   . Open wound of left knee    warm soaks daily /  neosprin and dressing daily / per per still has drainage  . Patellar bursitis of left knee    pre-patella septic bursitis w/ foreign body  . Right thyroid nodule    x2 right side incidental finding on carotid duplex 03/ 2018-- thyroid ultrasound done , benign , follow-up in one year  . Wears hearing aid in both ears     Past Surgical History:  Procedure Laterality Date  . APPENDECTOMY  age 81  . BREAST BIOPSY  1962   benign  . CATARACT EXTRACTION W/PHACO  06/25/2011   Procedure: CATARACT EXTRACTION PHACO AND INTRAOCULAR LENS PLACEMENT (IOC);  Surgeon: Williams Che, MD;  Location: [facility];  Service: Ophthalmology;  Laterality: Right;  CDE: 8.90  . CATARACT EXTRACTION W/PHACO Left  05/05/2012   Procedure: CATARACT EXTRACTION PHACO AND INTRAOCULAR LENS PLACEMENT (IOC);  Surgeon: Williams Che, MD;  Location: [facility];  Service: Ophthalmology;  Laterality: Left;  CDE 12.78  . COLONOSCOPY  last one 11-26-2012  . EXCISION MORTON'S NEUROMA Right 2003  approx.  . INCISION AND DRAINAGE WOUND WITH FOREIGN BODY REMOVAL Left 08/30/2016   Procedure: LEFT KNEE INCISION AND DRAINAGE WOUND WITH FOREIGN BODY REMOVAL;  Surgeon: Sydnee Cabal, MD;  Location: [facility];  Service: Orthopedics;  Laterality: Left;  . KNEE ARTHROPLASTY Right 03/14/2017   Procedure: RIGHT TOTAL KNEE ARTHROPLASTY WITH COMPUTER NAVIGATION AND REMOVAL OF HARDWARE;  Surgeon: Rod Can, MD;  Location: [facility];  Service: Orthopedics;  Laterality: Right;  Adductor Block  . KNEE ARTHROSCOPY W/ ACL RECONSTRUCTION Right 1996  . KNEE CARTILAGE SURGERY Right age 12  . MOHS SURGERY  02/10/2013   left cheek -- BCC  . POSTERIOR REPAIR  05-21-2002   dr Glo Herring   symptomatic recetocele  . TONSILLECTOMY AND ADENOIDECTOMY  age 27  . TRANSTHORACIC ECHOCARDIOGRAM  04-17-2016  dr Domenic Polite   ef 60-65%/  trivial MR/ mild TR  . VAGINAL HYSTERECTOMY  1979   w/  Bilateral Salpingoophorectomy    There were  no vitals filed for this visit.  Subjective Assessment - 03/25/17 1352    Subjective  Patient reports earlier today she sat on a low toilet and bent her right knee much farther than she has been able to, she reports it was very painful and left her sore but since then it has improved and is only about a 2/10 now. She states she is still icing and she is elevating and reports no difficulty with her HEP. She states she has her 2 week follow up this wednesday with her MD.    Limitations  Sitting;Walking;House hold activities    Patient Stated Goals  to be the best that I can; get back to as close to where I was prior    Currently in Pain?  Yes    Pain Score  2     Pain Location  Knee    Pain Orientation  Right     Pain Descriptors / Indicators  Aching;Burning;Tightness;Sore    Pain Type  Surgical pain    Pain Onset  1 to 4 weeks ago    Pain Frequency  Intermittent    Aggravating Factors   bending her knee    Pain Relieving Factors  rest and ince    Effect of Pain on Daily Activities  takes more time and is  more difficult        [facility] Adult PT Treatment/Exercise - 03/25/17 0001      Knee/Hip Exercises: Stretches   Passive Hamstring Stretch  Right;3 reps;30 seconds    Passive Hamstring Stretch Limitations  12" step    Knee: Self-Stretch to increase Flexion  Right    Knee: Self-Stretch Limitations  3x 30 seconds on 12" step      Knee/Hip Exercises: Aerobic   Stationary Bike  x4 mins at beginning of session, seat 9, 1/2 revolutions      Knee/Hip Exercises: Standing   Terminal Knee Extension  AROM;Strengthening;Right;1 set;15 reps;Limitations    Terminal Knee Extension Limitations  Blue [employer] and 5 second holds    Rocker Board  2 minutes;Limitations    Rocker Board Limitations  2x 1 minute, lateral      Knee/Hip Exercises: Supine   Quad Sets  Right;1 set;15 reps    Quad Sets Limitations  5"' holds    Heel Slides  Right;1 set;15 reps    Heel Slides Limitations  5" holds    Straight Leg Raises  Right;1 set;15 reps;Limitations    Straight Leg Raises Limitations  quad set before      Manual Therapy   Manual Therapy  Edema management    Manual therapy comments  completed separate rest of treatment    Edema Management  retro massage with BLE elevated        PT Education - 03/25/17 1400    Education provided  Yes    Education Details  educated on form and technique    Person(s) Educated  Patient    Methods  Explanation    Comprehension  Verbalized understanding       PT Short Term Goals - 03/18/17 1613      PT SHORT TERM GOAL #1   Title  Pt will be independent with HEP and perform consistently in order to maximize retrun to PLOF.    Time  3    Period  Weeks    Status  New     Target Date  04/08/17      PT SHORT TERM GOAL #2   Title  Pt will have improved knee AROM from 8-110deg to maximize gait and decrease pain.    Time  3    Period  Weeks    Status  New      PT SHORT TERM GOAL #3   Title  Pt will have decreased R knee joint line edema by 2" or > to decrease pain, maximize ROM, and maximize gait.    Time  3    Period  Weeks    Status  New        PT Long Term Goals - 03/18/17 1615      PT LONG TERM GOAL #1   Title  Pt will have imrpoved ROM to 5-120deg or better to maximize sitting tolerance and stair ambulation.    Time  6    Period  Weeks    Status  New    Target Date  04/29/17      PT LONG TERM GOAL #2   Title  Pt will have at least 4+/5 or > MMT of all muscle groups tested in order to maximize gait, functional strength, and promote return to PLOF.    Time  6    Period  Weeks    Status  New      PT LONG TERM GOAL #3   Title  Pt will be able to perform the TUG in 10 sec or < with LRAD to demo improved functional strength and gait.    Time  6    Period  Weeks    Status  New      PT LONG TERM GOAL #4   Title  Pt will have improved 3MWT by 144ft or > with LRAD and min to no gait deviations to demo improved overall functional strength, gait, and community ambulation.    Time  6    Period  Weeks    Status  New      PT LONG TERM GOAL #5   Title  Pt will report being able to participate in her regular exercise routine and hobbies/outdoor activities without R knee pain to demo pt's return to PLOF and to maximize overall health.    Time  6    Period  Weeks    Status  New        Plan - 03/25/17 1402    Clinical Impression Statement  Patient is progressing well with therapy and continues to report compliance with HEP. Patient was able to advance standing exercises for ROM and proprioception training with no increase in pain this session. She continues to present with minimal edema along the medial portion of her right quad and responded well  to manual therapy to reduce edema and pain this session. She will continue to benefit from skilled PT services to address ongoing impairments and progress towards goals.     Rehab Potential  Excellent    PT Frequency  3x / week    PT Duration  6 weeks    PT Treatment/Interventions  ADLs/Self Care Home Management;Cryotherapy;Electrical Stimulation;Moist Heat;Ultrasound;DME Instruction;Gait training;Stair training;Functional mobility training;Therapeutic activities;Therapeutic exercise;Balance training;Neuromuscular re-education;Patient/family education;Orthotic Fit/Training;Manual techniques;Scar mobilization;Passive range of motion;Dry needling;Energy conservation;Taping    PT Next Visit Plan  Continue to focus on mobility and decreasing edema; manual for edema management and soft tissue restrictions. Advance ROM exercises in standing as able.    PT Home Exercise Plan  eval: quad sets, heel slides, SAQ    Consulted and Agree with Plan of Care  Patient  Patient will benefit from skilled therapeutic intervention in order to improve the following deficits and impairments:  Abnormal gait, Decreased activity tolerance, Decreased mobility, Decreased range of motion, Decreased strength, Difficulty walking, Hypomobility, Increased edema, Increased muscle spasms, Impaired flexibility, Pain  Visit Diagnosis: Stiffness of right knee, not elsewhere classified  Muscle weakness (generalized)  Localized edema  Difficulty in walking, not elsewhere classified     Problem List Patient Active Problem List   Diagnosis Date Noted  . Post-traumatic osteoarthritis of right knee 03/14/2017  . S/P knee surgery 08/30/2016  . History of recurrent UTIs 09/19/2015  . Osteopenia 05/21/2015  . Osteoarthritis of right hip 05/21/2015  . Arrhythmia, long term 02/15/2014  . Esophageal reflux 02/15/2014  . Chronic back pain 06/01/2013  . Change in bowel habits 11/21/2012  . Diarrhea 11/21/2012    Kipp Brood, PT, DPT Physical Therapist with [facility]  03/25/2017 4:32 PM    [facility] [address] [place], [place], [address] Phone: [phone]   Fax:  [phone]  Name: [name] MRN: [id number] Date of Birth: 11-26-1940

## 2017-03-27 ENCOUNTER — Ambulatory Visit (HOSPITAL_COMMUNITY): Payer: Medicare Other

## 2017-03-27 ENCOUNTER — Encounter (HOSPITAL_COMMUNITY): Payer: Self-pay

## 2017-03-27 DIAGNOSIS — M6281 Muscle weakness (generalized): Secondary | ICD-10-CM | POA: Diagnosis not present

## 2017-03-27 DIAGNOSIS — R262 Difficulty in walking, not elsewhere classified: Secondary | ICD-10-CM

## 2017-03-27 DIAGNOSIS — M25661 Stiffness of right knee, not elsewhere classified: Secondary | ICD-10-CM | POA: Diagnosis not present

## 2017-03-27 DIAGNOSIS — R600 Localized edema: Secondary | ICD-10-CM

## 2017-03-27 NOTE — Therapy (Signed)
[place], [place], [address] Phone: [phone]   Fax:  [phone]  Physical Therapy Treatment  Patient Details  Name: [name] MRN: [id number] Date of Birth: 1940/03/29 Referring Provider: Rod Can, MD   Encounter Date: 03/27/2017  PT End of Session - 03/27/17 1349    Visit Number  5    Number of Visits  19    Date for PT Re-Evaluation  04/08/17    Authorization Type  [employer] Medicare    Authorization Time Period  03/18/17 to 04/29/17    PT Start Time  1345    PT Stop Time  1428    PT Time Calculation (min)  43 min    Activity Tolerance  Patient tolerated treatment well;No increased pain    Behavior During Therapy  WFL for tasks assessed/performed       Past Medical History:  Diagnosis Date  . Arthritis   . Diverticulosis of colon   . Essential hypertension   . History of basal cell carcinoma (BCC) excision    02-10-2013  left cheek  s/p  moh's sx  . History of palpitations    cardiolgoist-  dr Domenic Polite  . History of recurrent UTIs   . History of syncope    2015-- felt to vasovagal response to pain (per cardiologist note)  . IBS (irritable bowel syndrome)   . Open wound of left knee    warm soaks daily /  neosprin and dressing daily / per per still has drainage  . Patellar bursitis of left knee    pre-patella septic bursitis w/ foreign body  . Right thyroid nodule    x2 right side incidental finding on carotid duplex 03/ 2018-- thyroid ultrasound done , benign , follow-up in one year  . Wears hearing aid in both ears     Past Surgical History:  Procedure Laterality Date  . APPENDECTOMY  age 77  . BREAST BIOPSY  1962   benign  . CATARACT EXTRACTION W/PHACO  06/25/2011   Procedure: CATARACT EXTRACTION PHACO AND INTRAOCULAR LENS PLACEMENT (IOC);  Surgeon: Williams Che, MD;  Location: [facility];  Service: Ophthalmology;  Laterality: Right;  CDE: 8.90  . CATARACT EXTRACTION W/PHACO Left  05/05/2012   Procedure: CATARACT EXTRACTION PHACO AND INTRAOCULAR LENS PLACEMENT (IOC);  Surgeon: Williams Che, MD;  Location: [facility];  Service: Ophthalmology;  Laterality: Left;  CDE 12.78  . COLONOSCOPY  last one 11-26-2012  . EXCISION MORTON'S NEUROMA Right 2003  approx.  . INCISION AND DRAINAGE WOUND WITH FOREIGN BODY REMOVAL Left 08/30/2016   Procedure: LEFT KNEE INCISION AND DRAINAGE WOUND WITH FOREIGN BODY REMOVAL;  Surgeon: Sydnee Cabal, MD;  Location: [facility];  Service: Orthopedics;  Laterality: Left;  . KNEE ARTHROPLASTY Right 03/14/2017   Procedure: RIGHT TOTAL KNEE ARTHROPLASTY WITH COMPUTER NAVIGATION AND REMOVAL OF HARDWARE;  Surgeon: Rod Can, MD;  Location: [facility];  Service: Orthopedics;  Laterality: Right;  Adductor Block  . KNEE ARTHROSCOPY W/ ACL RECONSTRUCTION Right 1996  . KNEE CARTILAGE SURGERY Right age 77  . MOHS SURGERY  02/10/2013   left cheek -- BCC  . POSTERIOR REPAIR  05-21-2002   dr Glo Herring   symptomatic recetocele  . TONSILLECTOMY AND ADENOIDECTOMY  age 77  . TRANSTHORACIC ECHOCARDIOGRAM  04-17-2016  dr Domenic Polite   ef 60-65%/  trivial MR/ mild TR  . VAGINAL HYSTERECTOMY  1979   w/  Bilateral Salpingoophorectomy    There were  no vitals filed for this visit.  Subjective Assessment - 03/27/17 1350    Subjective  Pt states she had her first post-op f/u this morning. He was very pleased with her progress and said that everything was healing very well.     Limitations  Sitting;Walking;House hold activities    Patient Stated Goals  to be the best that I can; get back to as close to where I was prior    Currently in Pain?  Yes    Pain Score  1     Pain Location  Knee    Pain Orientation  Right    Pain Descriptors / Indicators  Aching;Burning;Tightness;Sore    Pain Type  Surgical pain    Pain Onset  1 to 4 weeks ago    Pain Frequency  Intermittent    Aggravating Factors   bending her knee    Pain Relieving Factors   rest and ice     Effect of Pain on Daily Activities  takes more time and is more difficult          [facility] Adult PT Treatment/Exercise - 03/27/17 0001      Knee/Hip Exercises: Stretches   Passive Hamstring Stretch  Right;3 reps;30 seconds    Passive Hamstring Stretch Limitations  12" step    Quad Stretch  Right;3 reps;30 seconds    Quad Stretch Limitations  prone with rope    Knee: Self-Stretch to increase Flexion  Right    Knee: Self-Stretch Limitations  3x 30 seconds on 12" step    Gastroc Stretch  Both;3 reps;30 seconds    Gastroc Stretch Limitations  slant board      Knee/Hip Exercises: Aerobic   Stationary Bike  x4 mins at beginning of session, seat 8, 1/2 revolutions      Knee/Hip Exercises: Standing   Terminal Knee Extension Limitations  10x10" holds BTB    Rocker Board  2 minutes;Limitations    Rocker Board Limitations  R/L      Knee/Hip Exercises: Supine   Knee Extension Limitations  9    Knee Flexion Limitations  95      Manual Therapy   Manual Therapy  Edema management    Manual therapy comments  completed separate rest of treatment    Edema Management  retro massage with BLE elevated          PT Education - 03/27/17 1429    Education provided  Yes    Education Details  exercise technique, updated HEP    Person(s) Educated  Patient    Methods  Explanation;Demonstration;Handout    Comprehension  Verbalized understanding;Returned demonstration       PT Short Term Goals - 03/18/17 1613      PT SHORT TERM GOAL #1   Title  Pt will be independent with HEP and perform consistently in order to maximize retrun to PLOF.    Time  3    Period  Weeks    Status  New    Target Date  04/08/17      PT SHORT TERM GOAL #2   Title  Pt will have improved knee AROM from 8-110deg to maximize gait and decrease pain.    Time  3    Period  Weeks    Status  New      PT SHORT TERM GOAL #3   Title  Pt will have decreased R knee joint line edema by 2" or > to decrease pain, maximize  ROM, and maximize  gait.    Time  3    Period  Weeks    Status  New        PT Long Term Goals - 03/18/17 1615      PT LONG TERM GOAL #1   Title  Pt will have imrpoved ROM to 5-120deg or better to maximize sitting tolerance and stair ambulation.    Time  6    Period  Weeks    Status  New    Target Date  04/29/17      PT LONG TERM GOAL #2   Title  Pt will have at least 4+/5 or > MMT of all muscle groups tested in order to maximize gait, functional strength, and promote return to PLOF.    Time  6    Period  Weeks    Status  New      PT LONG TERM GOAL #3   Title  Pt will be able to perform the TUG in 10 sec or < with LRAD to demo improved functional strength and gait.    Time  6    Period  Weeks    Status  New      PT LONG TERM GOAL #4   Title  Pt will have improved 3MWT by 179ft or > with LRAD and min to no gait deviations to demo improved overall functional strength, gait, and community ambulation.    Time  6    Period  Weeks    Status  New      PT LONG TERM GOAL #5   Title  Pt will report being able to participate in her regular exercise routine and hobbies/outdoor activities without R knee pain to demo pt's return to PLOF and to maximize overall health.    Time  6    Period  Weeks    Status  New            Plan - 03/27/17 1627    Clinical Impression Statement  Continued with established POC focusing on mobility and edema management. Added prone quad stretch this date with good tolerance but min cues for proper technique. Pt without surgical bandage on as Dr. Lyla Glassing removed it this morning. Her scar looked good with no erythema or warmth noted. Ended with manual retro massage to address edema. Pt's AROM 9-95 this date. PT sent this note to MD for updated numbers for her f/u visit as he requested them. Continue as planned, progressing as able.    Rehab Potential  Excellent    PT Frequency  3x / week    PT Duration  6 weeks    PT Treatment/Interventions  ADLs/Self  Care Home Management;Cryotherapy;Electrical Stimulation;Moist Heat;Ultrasound;DME Instruction;Gait training;Stair training;Functional mobility training;Therapeutic activities;Therapeutic exercise;Balance training;Neuromuscular re-education;Patient/family education;Orthotic Fit/Training;Manual techniques;Scar mobilization;Passive range of motion;Dry needling;Energy conservation;Taping    PT Next Visit Plan  continue prone quad stretch; Continue to focus on mobility and decreasing edema; manual for edema management and soft tissue restrictions. Advance ROM exercises in standing as able.    PT Home Exercise Plan  eval: quad sets, heel slides, SAQ; 2/20: knee drives on step    Consulted and Agree with Plan of Care  Patient       Patient will benefit from skilled therapeutic intervention in order to improve the following deficits and impairments:  Abnormal gait, Decreased activity tolerance, Decreased mobility, Decreased range of motion, Decreased strength, Difficulty walking, Hypomobility, Increased edema, Increased muscle spasms, Impaired flexibility, Pain  Visit Diagnosis: Stiffness of right  knee, not elsewhere classified  Muscle weakness (generalized)  Localized edema  Difficulty in walking, not elsewhere classified     Problem List Patient Active Problem List   Diagnosis Date Noted  . Post-traumatic osteoarthritis of right knee 03/14/2017  . S/P knee surgery 08/30/2016  . History of recurrent UTIs 09/19/2015  . Osteopenia 05/21/2015  . Osteoarthritis of right hip 05/21/2015  . Arrhythmia, long term 02/15/2014  . Esophageal reflux 02/15/2014  . Chronic back pain 06/01/2013  . Change in bowel habits 11/21/2012  . Diarrhea 11/21/2012       Geraldine Solar PT, DPT  [facility] [address] [place], [place], [address] Phone: [phone]   Fax:  [phone]  Name: ILMA ACHEE MRN: [id number] Date of Birth: 06-23-1940

## 2017-03-27 NOTE — Patient Instructions (Signed)
  Knee Flexion Stretch on Step  Place foot on step and lean forward until you feel a good stretch in front of knee.   Perform 1-2x/day, 5-10 reps holding for 15-30 seconds

## 2017-03-28 ENCOUNTER — Ambulatory Visit (HOSPITAL_COMMUNITY): Payer: Medicare Other

## 2017-03-28 ENCOUNTER — Other Ambulatory Visit: Payer: Self-pay

## 2017-03-28 ENCOUNTER — Encounter (HOSPITAL_COMMUNITY): Payer: Self-pay

## 2017-03-28 DIAGNOSIS — M6281 Muscle weakness (generalized): Secondary | ICD-10-CM

## 2017-03-28 DIAGNOSIS — M25661 Stiffness of right knee, not elsewhere classified: Secondary | ICD-10-CM | POA: Diagnosis not present

## 2017-03-28 DIAGNOSIS — R262 Difficulty in walking, not elsewhere classified: Secondary | ICD-10-CM | POA: Diagnosis not present

## 2017-03-28 DIAGNOSIS — R600 Localized edema: Secondary | ICD-10-CM | POA: Diagnosis not present

## 2017-03-28 NOTE — Therapy (Signed)
[facility] [place], [place], [address] Phone: [phone]   Fax:  [phone]  Physical Therapy Treatment  Patient Details  Name: [name] MRN: [id number] Date of Birth: 1940-11-16 Referring Provider: Rod Can, MD   Encounter Date: 03/28/2017  AROM:  Flexion: 102            Extension: 8   PT End of Session - 03/28/17 1258    Visit Number  6    Number of Visits  19    Date for PT Re-Evaluation  04/08/17    Authorization Type  [employer] Medicare    Authorization Time Period  03/18/17 to 04/29/17    PT Start Time  1302    PT Stop Time  1345    PT Time Calculation (min)  43 min    Activity Tolerance  Patient tolerated treatment well;No increased pain    Behavior During Therapy  WFL for tasks assessed/performed       Past Medical History:  Diagnosis Date  . Arthritis   . Diverticulosis of colon   . Essential hypertension   . History of basal cell carcinoma (BCC) excision    02-10-2013  left cheek  s/p  moh's sx  . History of palpitations    cardiolgoist-  dr Domenic Polite  . History of recurrent UTIs   . History of syncope    2015-- felt to vasovagal response to pain (per cardiologist note)  . IBS (irritable bowel syndrome)   . Open wound of left knee    warm soaks daily /  neosprin and dressing daily / per per still has drainage  . Patellar bursitis of left knee    pre-patella septic bursitis w/ foreign body  . Right thyroid nodule    x2 right side incidental finding on carotid duplex 03/ 2018-- thyroid ultrasound done , benign , follow-up in one year  . Wears hearing aid in both ears     Past Surgical History:  Procedure Laterality Date  . APPENDECTOMY  age 77  . BREAST BIOPSY  1962   benign  . CATARACT EXTRACTION W/PHACO  06/25/2011   Procedure: CATARACT EXTRACTION PHACO AND INTRAOCULAR LENS PLACEMENT (IOC);  Surgeon: Williams Che, MD;  Location: [facility];  Service: Ophthalmology;  Laterality: Right;   CDE: 8.90  . CATARACT EXTRACTION W/PHACO Left 05/05/2012   Procedure: CATARACT EXTRACTION PHACO AND INTRAOCULAR LENS PLACEMENT (IOC);  Surgeon: Williams Che, MD;  Location: [facility];  Service: Ophthalmology;  Laterality: Left;  CDE 12.78  . COLONOSCOPY  last one 11-26-2012  . EXCISION MORTON'S NEUROMA Right 2003  approx.  . INCISION AND DRAINAGE WOUND WITH FOREIGN BODY REMOVAL Left 08/30/2016   Procedure: LEFT KNEE INCISION AND DRAINAGE WOUND WITH FOREIGN BODY REMOVAL;  Surgeon: Sydnee Cabal, MD;  Location: [facility];  Service: Orthopedics;  Laterality: Left;  . KNEE ARTHROPLASTY Right 78/08/2017   Procedure: RIGHT TOTAL KNEE ARTHROPLASTY WITH COMPUTER NAVIGATION AND REMOVAL OF HARDWARE;  Surgeon: Rod Can, MD;  Location: [facility];  Service: Orthopedics;  Laterality: Right;  Adductor Block  . KNEE ARTHROSCOPY W/ ACL RECONSTRUCTION Right 1996  . KNEE CARTILAGE SURGERY Right age 77  . MOHS SURGERY  02/10/2013   left cheek -- BCC  . POSTERIOR REPAIR  05-21-2002   dr Glo Herring   symptomatic recetocele  . TONSILLECTOMY AND ADENOIDECTOMY  age 77  . TRANSTHORACIC ECHOCARDIOGRAM  04-17-2016  dr Domenic Polite   ef 60-65%/  trivial MR/  mild TR  . VAGINAL HYSTERECTOMY  1979   w/  Bilateral Salpingoophorectomy    There were no vitals filed for this visit.  Subjective Assessment - 03/28/17 1305    Subjective  She is feeling well today and denies pain. She is still icing multiple times per day and it is helping with her pain. She thinks her swelling is going down some and states her HEP is going well.     Limitations  Sitting;Walking;House hold activities    Patient Stated Goals  to be the best that I can; get back to as close to where I was prior    Currently in Pain?  No/denies         [facility] Adult PT Treatment/Exercise - 03/28/17 0001      Knee/Hip Exercises: Stretches   Passive Hamstring Stretch  Right;3 reps;30 seconds    Passive Hamstring Stretch Limitations  12" step     Quad Stretch  Right;3 reps;30 seconds    Quad Stretch Limitations  prone with rope    Knee: Self-Stretch to increase Flexion  Right    Knee: Self-Stretch Limitations  3x 30 seconds on 12" step    Gastroc Stretch  Both;3 reps;30 seconds    Gastroc Stretch Limitations  slant board      Knee/Hip Exercises: Aerobic   Stationary Bike  x4 mins at beginning of session, seat 9, 1/2 revolutions patient abelt o achieve full revolutions 50% of time      Knee/Hip Exercises: Standing   Terminal Knee Extension  AROM;Strengthening;Right;1 set;15 reps;Limitations    Terminal Knee Extension Limitations  Blue TB 5 second holds    Rocker Board  2 minutes;Limitations    Rocker Board Limitations  2x 1 minute, lateral      Knee/Hip Exercises: Supine   Short Arc Quad Sets  Right;15 reps    Short Arc Quad Sets Limitations  5 second holds; with basketball    Knee Extension Limitations  8    Knee Flexion Limitations  102      Manual Therapy   Manual Therapy  Edema management    Manual therapy comments  completed separate rest of treatment    Edema Management  retro massage with BLE elevated       PT Education - 03/28/17 1316    Education provided  Yes    Education Details  educated on exercises throughout and progress with ROM    Person(s) Educated  Patient    Methods  Explanation    Comprehension  Verbalized understanding       PT Short Term Goals - 03/18/17 1613      PT SHORT TERM GOAL #1   Title  Pt will be independent with HEP and perform consistently in order to maximize retrun to PLOF.    Time  3    Period  Weeks    Status  New    Target Date  04/08/17      PT SHORT TERM GOAL #2   Title  Pt will have improved knee AROM from 8-110deg to maximize gait and decrease pain.    Time  3    Period  Weeks    Status  New      PT SHORT TERM GOAL #3   Title  Pt will have decreased R knee joint line edema by 2" or > to decrease pain, maximize ROM, and maximize gait.    Time  3    Period  Weeks  Status  New        PT Long Term Goals - 03/18/17 1615      PT LONG TERM GOAL #1   Title  Pt will have imrpoved ROM to 5-120deg or better to maximize sitting tolerance and stair ambulation.    Time  6    Period  Weeks    Status  New    Target Date  04/29/17      PT LONG TERM GOAL #2   Title  Pt will have at least 4+/5 or > MMT of all muscle groups tested in order to maximize gait, functional strength, and promote return to PLOF.    Time  6    Period  Weeks    Status  New      PT LONG TERM GOAL #3   Title  Pt will be able to perform the TUG in 10 sec or < with LRAD to demo improved functional strength and gait.    Time  6    Period  Weeks    Status  New      PT LONG TERM GOAL #4   Title  Pt will have improved 3MWT by 159ft or > with LRAD and min to no gait deviations to demo improved overall functional strength, gait, and community ambulation.    Time  6    Period  Weeks    Status  New      PT LONG TERM GOAL #5   Title  Pt will report being able to participate in her regular exercise routine and hobbies/outdoor activities without R knee pain to demo pt's return to PLOF and to maximize overall health.    Time  6    Period  Weeks    Status  New         Plan - 03/28/17 1317    Clinical Impression Statement  Patient continues to progress well in therapy and reports compliance with HEP. Patient advanced standing exercises for ROM and proprioception training with no increase in pain this session. Focus in therapy remains stretching and AROM to improve mobility and manual therapy for edema management.  Her scar appears to be healing well and when residual glue is gone will initiate scar mobilization to prevent adhesions. She will continue to benefit from skilled PT services to address ongoing impairments and progress towards goals. Patient requested today's note be forward to MD. Current AROM is for her Right knee is 8-102 degrees.    Rehab Potential  Excellent    PT Frequency   3x / week    PT Duration  6 weeks    PT Treatment/Interventions  ADLs/Self Care Home Management;Cryotherapy;Electrical Stimulation;Moist Heat;Ultrasound;DME Instruction;Gait training;Stair training;Functional mobility training;Therapeutic activities;Therapeutic exercise;Balance training;Neuromuscular re-education;Patient/family education;Orthotic Fit/Training;Manual techniques;Scar mobilization;Passive range of motion;Dry needling;Energy conservation;Taping    PT Next Visit Plan  continue prone quad stretch; Continue to focus on mobility and decreasing edema; manual for edema management and soft tissue restrictions. Advance ROM exercises in standing as able. Initiate scar mobilization when residual glue has cleared.    PT Home Exercise Plan  eval: quad sets, heel slides, SAQ; 2/20: knee drives on step    Consulted and Agree with Plan of Care  Patient       Patient will benefit from skilled therapeutic intervention in order to improve the following deficits and impairments:  Abnormal gait, Decreased activity tolerance, Decreased mobility, Decreased range of motion, Decreased strength, Difficulty walking, Hypomobility, Increased edema, Increased muscle spasms, Impaired flexibility, Pain  Visit Diagnosis: Stiffness of right knee, not elsewhere classified  Muscle weakness (generalized)  Localized edema  Difficulty in walking, not elsewhere classified     Problem List Patient Active Problem List   Diagnosis Date Noted  . Post-traumatic osteoarthritis of right knee 03/14/2017  . S/P knee surgery 08/30/2016  . History of recurrent UTIs 09/19/2015  . Osteopenia 05/21/2015  . Osteoarthritis of right hip 05/21/2015  . Arrhythmia, long term 02/15/2014  . Esophageal reflux 02/15/2014  . Chronic back pain 06/01/2013  . Change in bowel habits 11/21/2012  . Diarrhea 11/21/2012    Kipp Brood, PT, DPT Physical Therapist with [facility]  03/28/2017 3:03  PM    [facility] [address] [place], [place], [address] Phone: [phone]   Fax:  [phone]  Name: CYRIL RAILEY MRN: [id number] Date of Birth: 07/20/1940

## 2017-03-29 ENCOUNTER — Encounter (HOSPITAL_COMMUNITY): Payer: Medicare Other

## 2017-04-01 ENCOUNTER — Encounter (HOSPITAL_COMMUNITY): Payer: Self-pay

## 2017-04-01 ENCOUNTER — Ambulatory Visit (HOSPITAL_COMMUNITY): Payer: Medicare Other

## 2017-04-01 DIAGNOSIS — M6281 Muscle weakness (generalized): Secondary | ICD-10-CM

## 2017-04-01 DIAGNOSIS — R262 Difficulty in walking, not elsewhere classified: Secondary | ICD-10-CM

## 2017-04-01 DIAGNOSIS — M25661 Stiffness of right knee, not elsewhere classified: Secondary | ICD-10-CM | POA: Diagnosis not present

## 2017-04-01 DIAGNOSIS — R600 Localized edema: Secondary | ICD-10-CM | POA: Diagnosis not present

## 2017-04-01 NOTE — Therapy (Signed)
[place], [place], [address] Phone: [phone]   Fax:  [phone]  Physical Therapy Treatment  Patient Details  Name: [name] MRN: [id number] Date of Birth: 1940/11/30 Referring Provider: Rod Can, MD   Encounter Date: 04/01/2017  PT End of Session - 04/01/17 1259    Visit Number  7    Number of Visits  19    Date for PT Re-Evaluation  04/08/17    Authorization Type  [employer] Medicare    Authorization Time Period  03/18/17 to 04/29/17    PT Start Time  1300    PT Stop Time  1341    PT Time Calculation (min)  41 min    Activity Tolerance  Patient tolerated treatment well;No increased pain    Behavior During Therapy  WFL for tasks assessed/performed       Past Medical History:  Diagnosis Date  . Arthritis   . Diverticulosis of colon   . Essential hypertension   . History of basal cell carcinoma (BCC) excision    02-10-2013  left cheek  s/p  moh's sx  . History of palpitations    cardiolgoist-  dr Domenic Polite  . History of recurrent UTIs   . History of syncope    2015-- felt to vasovagal response to pain (per cardiologist note)  . IBS (irritable bowel syndrome)   . Open wound of left knee    warm soaks daily /  neosprin and dressing daily / per per still has drainage  . Patellar bursitis of left knee    pre-patella septic bursitis w/ foreign body  . Right thyroid nodule    x2 right side incidental finding on carotid duplex 03/ 2018-- thyroid ultrasound done , benign , follow-up in one year  . Wears hearing aid in both ears     Past Surgical History:  Procedure Laterality Date  . APPENDECTOMY  age 51  . BREAST BIOPSY  1962   benign  . CATARACT EXTRACTION W/PHACO  06/25/2011   Procedure: CATARACT EXTRACTION PHACO AND INTRAOCULAR LENS PLACEMENT (IOC);  Surgeon: Williams Che, MD;  Location: [facility];  Service: Ophthalmology;  Laterality: Right;  CDE: 8.90  . CATARACT EXTRACTION W/PHACO Left  05/05/2012   Procedure: CATARACT EXTRACTION PHACO AND INTRAOCULAR LENS PLACEMENT (IOC);  Surgeon: Williams Che, MD;  Location: [facility];  Service: Ophthalmology;  Laterality: Left;  CDE 12.78  . COLONOSCOPY  last one 11-26-2012  . EXCISION MORTON'S NEUROMA Right 2003  approx.  . INCISION AND DRAINAGE WOUND WITH FOREIGN BODY REMOVAL Left 08/30/2016   Procedure: LEFT KNEE INCISION AND DRAINAGE WOUND WITH FOREIGN BODY REMOVAL;  Surgeon: Sydnee Cabal, MD;  Location: [facility];  Service: Orthopedics;  Laterality: Left;  . KNEE ARTHROPLASTY Right 03/14/2017   Procedure: RIGHT TOTAL KNEE ARTHROPLASTY WITH COMPUTER NAVIGATION AND REMOVAL OF HARDWARE;  Surgeon: Rod Can, MD;  Location: [facility];  Service: Orthopedics;  Laterality: Right;  Adductor Block  . KNEE ARTHROSCOPY W/ ACL RECONSTRUCTION Right 1996  . KNEE CARTILAGE SURGERY Right age 39  . MOHS SURGERY  02/10/2013   left cheek -- BCC  . POSTERIOR REPAIR  05-21-2002   dr Glo Herring   symptomatic recetocele  . TONSILLECTOMY AND ADENOIDECTOMY  age 71  . TRANSTHORACIC ECHOCARDIOGRAM  04-17-2016  dr Domenic Polite   ef 60-65%/  trivial MR/ mild TR  . VAGINAL HYSTERECTOMY  1979   w/  Bilateral Salpingoophorectomy    There were  no vitals filed for this visit.  Subjective Assessment - 04/01/17 1300    Subjective  Pt states that she has been ambulating without her SPC as much as possible. No pain today.    Limitations  Sitting;Walking;House hold activities    Patient Stated Goals  to be the best that I can; get back to as close to where I was prior    Currently in Pain?  No/denies          [facility] Adult PT Treatment/Exercise - 04/01/17 0001      Knee/Hip Exercises: Stretches   Passive Hamstring Stretch  Right;3 reps;30 seconds    Passive Hamstring Stretch Limitations  12" step    Knee: Self-Stretch to increase Flexion  Right    Knee: Self-Stretch Limitations  3x 30 seconds on 12" step    Gastroc Stretch  Both;3 reps;30  seconds    Gastroc Stretch Limitations  slant board      Knee/Hip Exercises: Aerobic   Stationary Bike  x 4 mins at beginning of session for mobility, seat 7, 1/2 revolutions      Knee/Hip Exercises: Standing   Heel Raises  Both;15 reps;Limitations    Heel Raises Limitations  heel and toe    Knee Flexion  Right;15 reps    Terminal Knee Extension Limitations  10x10" BTB    Lateral Step Up  Right;15 reps;Step Height: 4"    Forward Step Up  Right;15 reps;Step Height: 4"    Rocker Board  2 minutes;Limitations    Rocker Board Limitations  R/L      Knee/Hip Exercises: Supine   Short Arc Quad Sets  Right;15 reps    Short Arc Quad Sets Limitations  2#, 2-3" holds    Straight Leg Raises  Right;15 reps    Straight Leg Raises Limitations  quad set before    Knee Extension Limitations  8    Knee Flexion Limitations  101      Knee/Hip Exercises: Prone   Hamstring Curl  15 reps;3 seconds      Manual Therapy   Manual Therapy  Edema management    Manual therapy comments  completed separate rest of treatment    Edema Management  retro massage with BLE elevated           PT Education - 04/01/17 1303    Education provided  Yes    Education Details  exercise technique    Person(s) Educated  Patient    Methods  Explanation;Demonstration    Comprehension  Verbalized understanding;Returned demonstration       PT Short Term Goals - 03/18/17 1613      PT SHORT TERM GOAL #1   Title  Pt will be independent with HEP and perform consistently in order to maximize retrun to PLOF.    Time  3    Period  Weeks    Status  New    Target Date  04/08/17      PT SHORT TERM GOAL #2   Title  Pt will have improved knee AROM from 8-110deg to maximize gait and decrease pain.    Time  3    Period  Weeks    Status  New      PT SHORT TERM GOAL #3   Title  Pt will have decreased R knee joint line edema by 2" or > to decrease pain, maximize ROM, and maximize gait.    Time  3    Period  Weeks  Status  New        PT Long Term Goals - 03/18/17 1615      PT LONG TERM GOAL #1   Title  Pt will have imrpoved ROM to 5-120deg or better to maximize sitting tolerance and stair ambulation.    Time  6    Period  Weeks    Status  New    Target Date  04/29/17      PT LONG TERM GOAL #2   Title  Pt will have at least 4+/5 or > MMT of all muscle groups tested in order to maximize gait, functional strength, and promote return to PLOF.    Time  6    Period  Weeks    Status  New      PT LONG TERM GOAL #3   Title  Pt will be able to perform the TUG in 10 sec or < with LRAD to demo improved functional strength and gait.    Time  6    Period  Weeks    Status  New      PT LONG TERM GOAL #4   Title  Pt will have improved 3MWT by 168ft or > with LRAD and min to no gait deviations to demo improved overall functional strength, gait, and community ambulation.    Time  6    Period  Weeks    Status  New      PT LONG TERM GOAL #5   Title  Pt will report being able to participate in her regular exercise routine and hobbies/outdoor activities without R knee pain to demo pt's return to PLOF and to maximize overall health.    Time  6    Period  Weeks    Status  New            Plan - 04/01/17 1342    Clinical Impression Statement  Continued with established POC focusing on knee mobility and progressing strengthening some this date. Introduced pt to standing and prone knee flexion and step ups on 4" step this date. Pt required min cues for form and reported some increased stiffness during therex. Ended with manual for edema control. Pt's AROM 8-101deg at EOS this date. No pain, just some tightness reported at EOS. Continue as planned progressing as able.    Rehab Potential  Excellent    PT Frequency  3x / week    PT Duration  6 weeks    PT Treatment/Interventions  ADLs/Self Care Home Management;Cryotherapy;Electrical Stimulation;Moist Heat;Ultrasound;DME Instruction;Gait training;Stair  training;Functional mobility training;Therapeutic activities;Therapeutic exercise;Balance training;Neuromuscular re-education;Patient/family education;Orthotic Fit/Training;Manual techniques;Scar mobilization;Passive range of motion;Dry needling;Energy conservation;Taping    PT Next Visit Plan  continue prone quad stretch; Continue to focus on mobility and decreasing edema; manual for edema management and soft tissue restrictions. Advance ROM exercises in standing as able. Initiate scar mobilization when residual glue has cleared. consider patellar mobs and prone TKE for improved ROM    PT Home Exercise Plan  eval: quad sets, heel slides, SAQ; 2/20: knee drives on step    Consulted and Agree with Plan of Care  Patient       Patient will benefit from skilled therapeutic intervention in order to improve the following deficits and impairments:  Abnormal gait, Decreased activity tolerance, Decreased mobility, Decreased range of motion, Decreased strength, Difficulty walking, Hypomobility, Increased edema, Increased muscle spasms, Impaired flexibility, Pain  Visit Diagnosis: Stiffness of right knee, not elsewhere classified  Muscle weakness (generalized)  Localized edema  Difficulty in walking, not elsewhere classified     Problem List Patient Active Problem List   Diagnosis Date Noted  . Post-traumatic osteoarthritis of right knee 03/14/2017  . S/P knee surgery 08/30/2016  . History of recurrent UTIs 09/19/2015  . Osteopenia 05/21/2015  . Osteoarthritis of right hip 05/21/2015  . Arrhythmia, long term 02/15/2014  . Esophageal reflux 02/15/2014  . Chronic back pain 06/01/2013  . Change in bowel habits 11/21/2012  . Diarrhea 11/21/2012        Geraldine Solar PT, DPT  [facility] [address] [place], [place], [address] Phone: [phone]   Fax:  [phone]  Name: [name] MRN: [id number] Date of Birth: 06-27-1940

## 2017-04-02 DIAGNOSIS — H26491 Other secondary cataract, right eye: Secondary | ICD-10-CM | POA: Diagnosis not present

## 2017-04-03 ENCOUNTER — Encounter (HOSPITAL_COMMUNITY): Payer: Self-pay

## 2017-04-03 ENCOUNTER — Ambulatory Visit (HOSPITAL_COMMUNITY): Payer: Medicare Other

## 2017-04-03 DIAGNOSIS — M6281 Muscle weakness (generalized): Secondary | ICD-10-CM

## 2017-04-03 DIAGNOSIS — R262 Difficulty in walking, not elsewhere classified: Secondary | ICD-10-CM

## 2017-04-03 DIAGNOSIS — M25661 Stiffness of right knee, not elsewhere classified: Secondary | ICD-10-CM

## 2017-04-03 DIAGNOSIS — R600 Localized edema: Secondary | ICD-10-CM

## 2017-04-03 NOTE — Patient Instructions (Addendum)
  Prone Quad Stretch  Lie down flat on your stomach. Wrap a strap (belt, towel, dog leash) around the top of one of your feet and pull the strap across your opposite shoulder so that your knee starts to curl up to your body. Pull until a stretch is felt across the front of your thigh.     Perform 1-2x/day, 3-5 stretches holding for 30-60 seconds   Seated Knee Flexion Stretch  Sit with leg relaxed. Place foot of good leg over the top of the shin of the injured/operated leg. Use good leg to bend the injured leg, to feel a stretch in the knee. Hold, relax, and repeat  Perform 1-2x/day, 5-10 stretches holding for 15-30 seconds   STANDING HEEL AND TOE RAISES  While standing, raise up on your toes as you lift your heels off the ground. Return to starting position and then raise your toes up off the ground, making sure to not stick your bottom out (stay standing up tall)  Perform 1x/day, 2-3 sets of 10-15 reps   Terminal knee extension -TKE  Place tubing/band around involved knee.  Stand with knee slightly bent.  Tighten the quadriceps to straighten the knee.  DO NOT PULL THE KNEE STRAIGHT WITH THE HAMSTRINGS.  Perform 1x/day, 1-2 sets of 10-15 reps holding for 10 seconds

## 2017-04-03 NOTE — Therapy (Signed)
[place], [place], [address] Phone: [phone]   Fax:  [phone]  Physical Therapy Treatment  Patient Details  Name: [name] MRN: [id number] Date of Birth: 05-30-1940 Referring Provider: Rod Can, MD   Encounter Date: 04/03/2017  PT End of Session - 04/03/17 1259    Visit Number  8    Number of Visits  19    Date for PT Re-Evaluation  04/08/17    Authorization Type  [employer] Medicare    Authorization Time Period  03/18/17 to 04/29/17    PT Start Time  1300    PT Stop Time  1346    PT Time Calculation (min)  46 min    Activity Tolerance  Patient tolerated treatment well;No increased pain    Behavior During Therapy  WFL for tasks assessed/performed       Past Medical History:  Diagnosis Date  . Arthritis   . Diverticulosis of colon   . Essential hypertension   . History of basal cell carcinoma (BCC) excision    02-10-2013  left cheek  s/p  moh's sx  . History of palpitations    cardiolgoist-  dr Domenic Polite  . History of recurrent UTIs   . History of syncope    2015-- felt to vasovagal response to pain (per cardiologist note)  . IBS (irritable bowel syndrome)   . Open wound of left knee    warm soaks daily /  neosprin and dressing daily / per per still has drainage  . Patellar bursitis of left knee    pre-patella septic bursitis w/ foreign body  . Right thyroid nodule    x2 right side incidental finding on carotid duplex 03/ 2018-- thyroid ultrasound done , benign , follow-up in one year  . Wears hearing aid in both ears     Past Surgical History:  Procedure Laterality Date  . APPENDECTOMY  age 76  . BREAST BIOPSY  1962   benign  . CATARACT EXTRACTION W/PHACO  06/25/2011   Procedure: CATARACT EXTRACTION PHACO AND INTRAOCULAR LENS PLACEMENT (IOC);  Surgeon: Williams Che, MD;  Location: [facility];  Service: Ophthalmology;  Laterality: Right;  CDE: 8.90  . CATARACT EXTRACTION W/PHACO Left  05/05/2012   Procedure: CATARACT EXTRACTION PHACO AND INTRAOCULAR LENS PLACEMENT (IOC);  Surgeon: Williams Che, MD;  Location: [facility];  Service: Ophthalmology;  Laterality: Left;  CDE 12.78  . COLONOSCOPY  last one 11-26-2012  . EXCISION MORTON'S NEUROMA Right 2003  approx.  . INCISION AND DRAINAGE WOUND WITH FOREIGN BODY REMOVAL Left 08/30/2016   Procedure: LEFT KNEE INCISION AND DRAINAGE WOUND WITH FOREIGN BODY REMOVAL;  Surgeon: Sydnee Cabal, MD;  Location: [facility];  Service: Orthopedics;  Laterality: Left;  . KNEE ARTHROPLASTY Right 03/14/2017   Procedure: RIGHT TOTAL KNEE ARTHROPLASTY WITH COMPUTER NAVIGATION AND REMOVAL OF HARDWARE;  Surgeon: Rod Can, MD;  Location: [facility];  Service: Orthopedics;  Laterality: Right;  Adductor Block  . KNEE ARTHROSCOPY W/ ACL RECONSTRUCTION Right 1996  . KNEE CARTILAGE SURGERY Right age 79  . MOHS SURGERY  02/10/2013   left cheek -- BCC  . POSTERIOR REPAIR  05-21-2002   dr Glo Herring   symptomatic recetocele  . TONSILLECTOMY AND ADENOIDECTOMY  age 61  . TRANSTHORACIC ECHOCARDIOGRAM  04-17-2016  dr Domenic Polite   ef 60-65%/  trivial MR/ mild TR  . VAGINAL HYSTERECTOMY  1979   w/  Bilateral Salpingoophorectomy    There were  no vitals filed for this visit.  Subjective Assessment - 04/03/17 1259    Subjective  Pt states she feels like she is progressing slowly. She is still having difficulty with bending it.     Limitations  Sitting;Walking;House hold activities    Patient Stated Goals  to be the best that I can; get back to as close to where I was prior    Currently in Pain?  No/denies           [facility] Adult PT Treatment/Exercise - 04/03/17 0001      Knee/Hip Exercises: Stretches   Passive Hamstring Stretch  Right;3 reps;30 seconds    Passive Hamstring Stretch Limitations  12" step    Quad Stretch  Right;3 reps;30 seconds    Quad Stretch Limitations  prone with rope    Knee: Self-Stretch to increase Flexion  Right     Knee: Self-Stretch Limitations  3x 30 seconds on 12" step    Gastroc Stretch  Both;3 reps;30 seconds    Gastroc Stretch Limitations  slant board    Other Knee/Hip Stretches  seated R knee flexion stretch 10x10" holds      Knee/Hip Exercises: Aerobic   Stationary Bike  x 4 mins at beginning of session for mobility, seat 7; 1/2, retro, intermittent fwd revolutions      Knee/Hip Exercises: Standing   Heel Raises  Both;15 reps;Limitations    Heel Raises Limitations  heel and toe    Knee Flexion  Right;15 reps      Knee/Hip Exercises: Supine   Short Arc Quad Sets  Right;15 reps    Short Arc Quad Sets Limitations  2#, 2-3" holds    Knee Extension Limitations  6    Knee Flexion Limitations  105      Knee/Hip Exercises: Prone   Hamstring Curl  15 reps;3 seconds      Manual Therapy   Manual Therapy  Edema management;Joint mobilization    Manual therapy comments  completed separate rest of treatment    Edema Management  retro massage with BLE elevated    Joint Mobilization  sup/inf patellar mobs for improved knee ROM           PT Education - 04/03/17 1311    Education provided  Yes    Education Details  exercise technqiue, updated HEP; normal progression following TKA    Person(s) Educated  Patient    Methods  Explanation;Demonstration;Handout    Comprehension  Verbalized understanding;Returned demonstration       PT Short Term Goals - 03/18/17 1613      PT SHORT TERM GOAL #1   Title  Pt will be independent with HEP and perform consistently in order to maximize retrun to PLOF.    Time  3    Period  Weeks    Status  New    Target Date  04/08/17      PT SHORT TERM GOAL #2   Title  Pt will have improved knee AROM from 8-110deg to maximize gait and decrease pain.    Time  3    Period  Weeks    Status  New      PT SHORT TERM GOAL #3   Title  Pt will have decreased R knee joint line edema by 2" or > to decrease pain, maximize ROM, and maximize gait.    Time  3    Period   Weeks    Status  New  PT Long Term Goals - 03/18/17 1615      PT LONG TERM GOAL #1   Title  Pt will have imrpoved ROM to 5-120deg or better to maximize sitting tolerance and stair ambulation.    Time  6    Period  Weeks    Status  New    Target Date  04/29/17      PT LONG TERM GOAL #2   Title  Pt will have at least 4+/5 or > MMT of all muscle groups tested in order to maximize gait, functional strength, and promote return to PLOF.    Time  6    Period  Weeks    Status  New      PT LONG TERM GOAL #3   Title  Pt will be able to perform the TUG in 10 sec or < with LRAD to demo improved functional strength and gait.    Time  6    Period  Weeks    Status  New      PT LONG TERM GOAL #4   Title  Pt will have improved 3MWT by 137ft or > with LRAD and min to no gait deviations to demo improved overall functional strength, gait, and community ambulation.    Time  6    Period  Weeks    Status  New      PT LONG TERM GOAL #5   Title  Pt will report being able to participate in her regular exercise routine and hobbies/outdoor activities without R knee pain to demo pt's return to PLOF and to maximize overall health.    Time  6    Period  Weeks    Status  New            Plan - 04/03/17 1400    Clinical Impression Statement  Pt presents to therapy today with feelings of slowed progress. PT explained normal progression of TKA and how pt is still relatively acute in her post-op rehab. Session focused on knee mobility and manual techniques for swelling and soft tissue restrictions. Pt required min cues for proper technique; no c/o pain during session, just reports of difficulty with knee flexion activities. Began patellar mobs this date for improve ROM. Pt's AROM at EOS 6 to 105deg. Updated HEP this date. Continue as planned, progressing as able.    Rehab Potential  Excellent    PT Frequency  3x / week    PT Duration  6 weeks    PT Treatment/Interventions  ADLs/Self Care Home  Management;Cryotherapy;Electrical Stimulation;Moist Heat;Ultrasound;DME Instruction;Gait training;Stair training;Functional mobility training;Therapeutic activities;Therapeutic exercise;Balance training;Neuromuscular re-education;Patient/family education;Orthotic Fit/Training;Manual techniques;Scar mobilization;Passive range of motion;Dry needling;Energy conservation;Taping    PT Next Visit Plan  Continue to focus on mobility and decreasing edema; manual for edema management and soft tissue restrictions. Advance ROM exercises in standing as able. Initiate scar mobilization when residual glue has cleared. continue patellar mobs; add prone TKE for improved ROM    PT Home Exercise Plan  eval: quad sets, heel slides, SAQ; 2/20: knee drives on step; 0/09: prone quad stretch, standing TKE, seated knee flexion stretch, heel and toe raises    Consulted and Agree with Plan of Care  Patient       Patient will benefit from skilled therapeutic intervention in order to improve the following deficits and impairments:  Abnormal gait, Decreased activity tolerance, Decreased mobility, Decreased range of motion, Decreased strength, Difficulty walking, Hypomobility, Increased edema, Increased muscle spasms, Impaired flexibility, Pain  Visit  Diagnosis: Stiffness of right knee, not elsewhere classified  Muscle weakness (generalized)  Localized edema  Difficulty in walking, not elsewhere classified     Problem List Patient Active Problem List   Diagnosis Date Noted  . Post-traumatic osteoarthritis of right knee 03/14/2017  . S/P knee surgery 08/30/2016  . History of recurrent UTIs 09/19/2015  . Osteopenia 05/21/2015  . Osteoarthritis of right hip 05/21/2015  . Arrhythmia, long term 02/15/2014  . Esophageal reflux 02/15/2014  . Chronic back pain 06/01/2013  . Change in bowel habits 11/21/2012  . Diarrhea 11/21/2012       Geraldine Solar PT, DPT  [facility] [address] [place], [place], [address] Phone: [phone]   Fax:  [phone]  Name: [name] MRN: [id number] Date of Birth: [birth year]

## 2017-04-05 ENCOUNTER — Ambulatory Visit (HOSPITAL_COMMUNITY): Payer: Medicare Other | Attending: Orthopedic Surgery

## 2017-04-05 ENCOUNTER — Encounter (HOSPITAL_COMMUNITY): Payer: Self-pay

## 2017-04-05 DIAGNOSIS — M25661 Stiffness of right knee, not elsewhere classified: Secondary | ICD-10-CM | POA: Insufficient documentation

## 2017-04-05 DIAGNOSIS — R262 Difficulty in walking, not elsewhere classified: Secondary | ICD-10-CM | POA: Diagnosis not present

## 2017-04-05 DIAGNOSIS — M6281 Muscle weakness (generalized): Secondary | ICD-10-CM

## 2017-04-05 DIAGNOSIS — R600 Localized edema: Secondary | ICD-10-CM | POA: Insufficient documentation

## 2017-04-05 NOTE — Therapy (Signed)
[place], [place], [address] Phone: [phone]   Fax:  [phone]  Physical Therapy Treatment  Patient Details  Name: [name] MRN: [id number] Date of Birth: [birth year] Referring Provider: Rod Can, MD   Encounter Date: 04/05/2017  PT End of Session - 04/05/17 1259    Visit Number  9    Number of Visits  19    Date for PT Re-Evaluation  04/08/17    Authorization Type  [employer] Medicare    Authorization Time Period  03/18/17 to 04/29/17    PT Start Time  1300    PT Stop Time  1345    PT Time Calculation (min)  45 min    Activity Tolerance  Patient tolerated treatment well;No increased pain    Behavior During Therapy  WFL for tasks assessed/performed       Past Medical History:  Diagnosis Date  . Arthritis   . Diverticulosis of colon   . Essential hypertension   . History of basal cell carcinoma (BCC) excision    02-10-2013  left cheek  s/p  moh's sx  . History of palpitations    cardiolgoist-  dr Domenic Polite  . History of recurrent UTIs   . History of syncope    2015-- felt to vasovagal response to pain (per cardiologist note)  . IBS (irritable bowel syndrome)   . Open wound of left knee    warm soaks daily /  neosprin and dressing daily / per per still has drainage  . Patellar bursitis of left knee    pre-patella septic bursitis w/ foreign body  . Right thyroid nodule    x2 right side incidental finding on carotid duplex 03/ 2018-- thyroid ultrasound done , benign , follow-up in one year  . Wears hearing aid in both ears     Past Surgical History:  Procedure Laterality Date  . APPENDECTOMY  age 26  . BREAST BIOPSY  1962   benign  . CATARACT EXTRACTION W/PHACO  06/25/2011   Procedure: CATARACT EXTRACTION PHACO AND INTRAOCULAR LENS PLACEMENT (IOC);  Surgeon: Williams Che, MD;  Location: [facility];  Service: Ophthalmology;  Laterality: Right;  CDE: 8.90  . CATARACT EXTRACTION W/PHACO Left  05/05/2012   Procedure: CATARACT EXTRACTION PHACO AND INTRAOCULAR LENS PLACEMENT (IOC);  Surgeon: Williams Che, MD;  Location: [facility];  Service: Ophthalmology;  Laterality: Left;  CDE 12.78  . COLONOSCOPY  last one 11-26-2012  . EXCISION MORTON'S NEUROMA Right 2003  approx.  . INCISION AND DRAINAGE WOUND WITH FOREIGN BODY REMOVAL Left 08/30/2016   Procedure: LEFT KNEE INCISION AND DRAINAGE WOUND WITH FOREIGN BODY REMOVAL;  Surgeon: Sydnee Cabal, MD;  Location: [facility];  Service: Orthopedics;  Laterality: Left;  . KNEE ARTHROPLASTY Right 03/14/2017   Procedure: RIGHT TOTAL KNEE ARTHROPLASTY WITH COMPUTER NAVIGATION AND REMOVAL OF HARDWARE;  Surgeon: Rod Can, MD;  Location: [facility];  Service: Orthopedics;  Laterality: Right;  Adductor Block  . KNEE ARTHROSCOPY W/ ACL RECONSTRUCTION Right 1996  . KNEE CARTILAGE SURGERY Right age 34  . MOHS SURGERY  02/10/2013   left cheek -- BCC  . POSTERIOR REPAIR  05-21-2002   dr Glo Herring   symptomatic recetocele  . TONSILLECTOMY AND ADENOIDECTOMY  age 8  . TRANSTHORACIC ECHOCARDIOGRAM  04-17-2016  dr Domenic Polite   ef 60-65%/  trivial MR/ mild TR  . VAGINAL HYSTERECTOMY  1979   w/  Bilateral Salpingoophorectomy    There were  no vitals filed for this visit.  Subjective Assessment - 04/05/17 1259    Subjective  Pt states that she is feeling good today, only has pain when she bends it. She states that she is going to try and go out to dinner tonight.     Limitations  Sitting;Walking;House hold activities    Patient Stated Goals  to be the best that I can; get back to as close to where I was prior    Currently in Pain?  No/denies           [facility] Adult PT Treatment/Exercise - 04/05/17 0001      Knee/Hip Exercises: Stretches   Passive Hamstring Stretch  Right;3 reps;30 seconds    Passive Hamstring Stretch Limitations  12" step    Knee: Self-Stretch to increase Flexion  Right    Knee: Self-Stretch Limitations  3x 30 seconds on  12" step    Gastroc Stretch  Both;3 reps;30 seconds    Gastroc Stretch Limitations  slant board      Knee/Hip Exercises: Aerobic   Stationary Bike  x 4 mins at beginning of session for mobility, seat 6; 1/2, retro, intermittent fwd revolutions      Knee/Hip Exercises: Standing   Lateral Step Up  Right;15 reps;Step Height: 4"    Forward Step Up  Right;15 reps;Step Height: 4"    Step Down  Right;15 reps;Step Height: 4"    Functional Squat  10 reps;Limitations    Functional Squat Limitations  tactile cue to improve weight shift over RLE    SLS  x30 sec firm; 5x10" on foam    Other Standing Knee Exercises  heel taps on 4" step x10 reps RLE only      Knee/Hip Exercises: Seated   Long Arc Quad  Right;15 reps;Weights;Limitations    Long Arc Quad Weight  2 lbs.    Long [employer] Limitations  3" holds    Sit to [employer]  10 reps;without UE support      Knee/Hip Exercises: Supine   Short Arc [employer]  Right;15 reps    Short Arc [employer] Limitations  basketball, 2#, 2-3" holds    Knee Extension Limitations  6    Knee Flexion Limitations  108      Knee/Hip Exercises: Prone   Hamstring Curl  15 reps;3 seconds    Hamstring Curl Limitations  2#    Other Prone Exercises  Prone TKE (2# weight on knee for proprioception) 10x3" holds      Manual Therapy   Manual Therapy  Edema management;Joint mobilization    Manual therapy comments  completed separate rest of treatment    Edema Management  retro massage with BLE elevated    Joint Mobilization  sup/inf patellar mobs for improved knee ROM           PT Education - 04/05/17 1259    Education provided  Yes    Education Details  exercise technique    Person(s) Educated  Patient    Methods  Explanation;Demonstration    Comprehension  Verbalized understanding;Returned demonstration       PT Short Term Goals - 03/18/17 1613      PT SHORT TERM GOAL #1   Title  Pt will be independent with HEP and perform consistently in order to maximize  retrun to PLOF.    Time  3    Period  Weeks    Status  New    Target Date  04/08/17  PT SHORT TERM GOAL #2   Title  Pt will have improved knee AROM from 8-110deg to maximize gait and decrease pain.    Time  3    Period  Weeks    Status  New      PT SHORT TERM GOAL #3   Title  Pt will have decreased R knee joint line edema by 2" or > to decrease pain, maximize ROM, and maximize gait.    Time  3    Period  Weeks    Status  New        PT Long Term Goals - 03/18/17 1615      PT LONG TERM GOAL #1   Title  Pt will have imrpoved ROM to 5-120deg or better to maximize sitting tolerance and stair ambulation.    Time  6    Period  Weeks    Status  New    Target Date  04/29/17      PT LONG TERM GOAL #2   Title  Pt will have at least 4+/5 or > MMT of all muscle groups tested in order to maximize gait, functional strength, and promote return to PLOF.    Time  6    Period  Weeks    Status  New      PT LONG TERM GOAL #3   Title  Pt will be able to perform the TUG in 10 sec or < with LRAD to demo improved functional strength and gait.    Time  6    Period  Weeks    Status  New      PT LONG TERM GOAL #4   Title  Pt will have improved 3MWT by 158ft or > with LRAD and min to no gait deviations to demo improved overall functional strength, gait, and community ambulation.    Time  6    Period  Weeks    Status  New      PT LONG TERM GOAL #5   Title  Pt will report being able to participate in her regular exercise routine and hobbies/outdoor activities without R knee pain to demo pt's return to PLOF and to maximize overall health.    Time  6    Period  Weeks    Status  New            Plan - 04/05/17 1346    Clinical Impression Statement  Pt continues to present to therapy with improving symptoms. Added step downs, sit to stands and functional squats this date for improved hip and knee strength. Pt able to perform SLS for 30 sec on firm on RLE so had pt perform on foam.  Continued with patellar mobs and manual for edema control for improved ROM at EOS. Pt 6-108deg this date. Continue as planned, progressing as able.    Rehab Potential  Excellent    PT Frequency  3x / week    PT Duration  6 weeks    PT Treatment/Interventions  ADLs/Self Care Home Management;Cryotherapy;Electrical Stimulation;Moist Heat;Ultrasound;DME Instruction;Gait training;Stair training;Functional mobility training;Therapeutic activities;Therapeutic exercise;Balance training;Neuromuscular re-education;Patient/family education;Orthotic Fit/Training;Manual techniques;Scar mobilization;Passive range of motion;Dry needling;Energy conservation;Taping    PT Next Visit Plan  reassessment; Continue to focus on mobility and decreasing edema; manual for edema management and soft tissue restrictions. Advance ROM exercises in standing as able. Initiate scar mobilization when residual glue has cleared. continue patellar mobs; add prone TKE for improved ROM    PT Home Exercise Plan  eval: quad  sets, heel slides, SAQ; 2/20: knee drives on step; 3/33: prone quad stretch, standing TKE, seated knee flexion stretch, heel and toe raises    Consulted and Agree with Plan of Care  Patient       Patient will benefit from skilled therapeutic intervention in order to improve the following deficits and impairments:  Abnormal gait, Decreased activity tolerance, Decreased mobility, Decreased range of motion, Decreased strength, Difficulty walking, Hypomobility, Increased edema, Increased muscle spasms, Impaired flexibility, Pain  Visit Diagnosis: Stiffness of right knee, not elsewhere classified  Muscle weakness (generalized)  Localized edema  Difficulty in walking, not elsewhere classified     Problem List Patient Active Problem List   Diagnosis Date Noted  . Post-traumatic osteoarthritis of right knee 03/14/2017  . S/P knee surgery 08/30/2016  . History of recurrent UTIs 09/19/2015  . Osteopenia 05/21/2015   . Osteoarthritis of right hip 05/21/2015  . Arrhythmia, long term 02/15/2014  . Esophageal reflux 02/15/2014  . Chronic back pain 06/01/2013  . Change in bowel habits 11/21/2012  . Diarrhea 11/21/2012       Geraldine Solar PT, DPT  [facility] [address] [place], [place], [address] Phone: [phone]   Fax:  [phone]  Name: [name] MRN: [id number] Date of Birth: Oct 23, 1940

## 2017-04-08 ENCOUNTER — Ambulatory Visit (HOSPITAL_COMMUNITY): Payer: Medicare Other

## 2017-04-08 DIAGNOSIS — R600 Localized edema: Secondary | ICD-10-CM | POA: Diagnosis not present

## 2017-04-08 DIAGNOSIS — M25661 Stiffness of right knee, not elsewhere classified: Secondary | ICD-10-CM | POA: Diagnosis not present

## 2017-04-08 DIAGNOSIS — M6281 Muscle weakness (generalized): Secondary | ICD-10-CM

## 2017-04-08 DIAGNOSIS — R262 Difficulty in walking, not elsewhere classified: Secondary | ICD-10-CM | POA: Diagnosis not present

## 2017-04-08 NOTE — Patient Instructions (Signed)
  BRIDGING  While lying on your back with knees bent, tighten your lower abdominals, squeeze your buttocks and then raise your buttocks off the floor/bed as creating a "Bridge" with your body. Hold and then lower yourself and repeat.  Perform 1x/day or every other day, 2-3 sets of 10-15 rep   LOOPED ELASTIC BAND HIP ABDUCTION  While standing with an elastic band looped around your ankles, move the target leg out to the side as shown.   Perform 1x/day or every other day, 2-3 sets of 10-15 reps with red band

## 2017-04-08 NOTE — Therapy (Signed)
[place], [place], [address] Phone: [phone]   Fax:  [phone]  Physical Therapy Treatment/Reassessment  Patient Details  Name: [name] MRN: [id number] Date of Birth: [birth year] Referring Provider: Rod Can, MD   Encounter Date: 04/08/2017  PT End of Session - 04/08/17 1259    Visit Number  10    Number of Visits  19    Date for PT Re-Evaluation  04/29/17    Authorization Type  [employer] Medicare    Authorization Time Period  03/18/17 to 04/29/17    PT Start Time  1300    PT Stop Time  1340    PT Time Calculation (min)  40 min    Activity Tolerance  Patient tolerated treatment well;No increased pain    Behavior During Therapy  WFL for tasks assessed/performed       Past Medical History:  Diagnosis Date  . Arthritis   . Diverticulosis of colon   . Essential hypertension   . History of basal cell carcinoma (BCC) excision    02-10-2013  left cheek  s/p  moh's sx  . History of palpitations    cardiolgoist-  dr Domenic Polite  . History of recurrent UTIs   . History of syncope    2015-- felt to vasovagal response to pain (per cardiologist note)  . IBS (irritable bowel syndrome)   . Open wound of left knee    warm soaks daily /  neosprin and dressing daily / per per still has drainage  . Patellar bursitis of left knee    pre-patella septic bursitis w/ foreign body  . Right thyroid nodule    x2 right side incidental finding on carotid duplex 03/ 2018-- thyroid ultrasound done , benign , follow-up in one year  . Wears hearing aid in both ears     Past Surgical History:  Procedure Laterality Date  . APPENDECTOMY  age 77  . BREAST BIOPSY  1962   benign  . CATARACT EXTRACTION W/PHACO  06/25/2011   Procedure: CATARACT EXTRACTION PHACO AND INTRAOCULAR LENS PLACEMENT (IOC);  Surgeon: Williams Che, MD;  Location: [facility];  Service: Ophthalmology;  Laterality: Right;  CDE: 8.90  . CATARACT EXTRACTION  W/PHACO Left 05/05/2012   Procedure: CATARACT EXTRACTION PHACO AND INTRAOCULAR LENS PLACEMENT (IOC);  Surgeon: Williams Che, MD;  Location: [facility];  Service: Ophthalmology;  Laterality: Left;  CDE 12.78  . COLONOSCOPY  last one 11-26-2012  . EXCISION MORTON'S NEUROMA Right 2003  approx.  . INCISION AND DRAINAGE WOUND WITH FOREIGN BODY REMOVAL Left 08/30/2016   Procedure: LEFT KNEE INCISION AND DRAINAGE WOUND WITH FOREIGN BODY REMOVAL;  Surgeon: Sydnee Cabal, MD;  Location: [facility];  Service: Orthopedics;  Laterality: Left;  . KNEE ARTHROPLASTY Right 03/14/2017   Procedure: RIGHT TOTAL KNEE ARTHROPLASTY WITH COMPUTER NAVIGATION AND REMOVAL OF HARDWARE;  Surgeon: Rod Can, MD;  Location: [facility];  Service: Orthopedics;  Laterality: Right;  Adductor Block  . KNEE ARTHROSCOPY W/ ACL RECONSTRUCTION Right 1996  . KNEE CARTILAGE SURGERY Right age 75  . MOHS SURGERY  02/10/2013   left cheek -- BCC  . POSTERIOR REPAIR  05-21-2002   dr Glo Herring   symptomatic recetocele  . TONSILLECTOMY AND ADENOIDECTOMY  age 78  . TRANSTHORACIC ECHOCARDIOGRAM  04-17-2016  dr Domenic Polite   ef 60-65%/  trivial MR/ mild TR  . VAGINAL HYSTERECTOMY  1979   w/  Bilateral Salpingoophorectomy    There were  no vitals filed for this visit.  Subjective Assessment - 04/08/17 1300    Subjective  Pt states that she was able to walk outside to their garage as well as in the grass. She stated that it went well and had no issues with it.     Limitations  Sitting;Walking;House hold activities    Patient Stated Goals  to be the best that I can; get back to as close to where I was prior    Currently in Pain?  No/denies         [facility] PT Assessment - 04/08/17 0001      Assessment   Medical Diagnosis  R TKR    Referring Provider  Rod Can, MD    Onset Date/Surgical Date  03/14/17    Next MD Visit  04/24/17    Prior Therapy  none for TKR      Circumferential Edema   Circumferential - Right   13.25" was 14.25" joint line      AROM   Right Knee Extension  8 was 7    Right Knee Flexion  110 was 93      Strength   Right Hip Flexion  4+/5 was 4-    Right Hip Extension  4-/5 was 4-    Right Hip ABduction  4/5 was 4    Left Hip Flexion  5/5 was 4+    Left Hip Extension  4/5 was 4-    Left Hip ABduction  4+/5 was 4+    Right Knee Flexion  4+/5 was 4+    Right Knee Extension  5/5 was4+    Right Ankle Dorsiflexion  4+/5 was 4+    Left Ankle Dorsiflexion  4+/5 was 4      Ambulation/Gait   Ambulation Distance (Feet)  730 Feet was 423f with RW    Assistive device  None    Gait Pattern  Step-through pattern;Trendelenburg increased R anterior pelvic rotation      Standardized Balance Assessment   Standardized Balance Assessment  Timed Up and Go Test      Timed Up and Go Test   Normal TUG (seconds)  7.6 was 16.3 with RW    TUG Comments  no AD           [facility] Adult PT Treatment/Exercise - 04/08/17 0001      Knee/Hip Exercises: Aerobic   Stationary Bike  x 4 mins at beginning of session for mobility, seat 6; 1/2, retro, intermittent fwd revolutions      Knee/Hip Exercises: Standing   Hip Abduction  Both;15 reps    Abduction Limitations  RTB      Knee/Hip Exercises: Supine   Bridges  Both;15 reps          PT Education - 04/08/17 1259    Education provided  Yes    Education Details  reassessment findings, exercise technique; updated HEP to include glute strengthening    Person(s) Educated  Patient    Methods  Explanation;Demonstration;Handout    Comprehension  Verbalized understanding;Returned demonstration       PT Short Term Goals - 04/08/17 1304      PT SHORT TERM GOAL #1   Title  Pt will be independent with HEP and perform consistently in order to maximize retrun to PLOF.    Time  3    Period  Weeks    Status  Achieved      PT SHORT TERM GOAL #2   Title  Pt will  have improved knee AROM from 8-110deg to maximize gait and decrease pain.    Baseline   3/4: 8-110deg    Time  3    Period  Weeks    Status  Achieved      PT SHORT TERM GOAL #3   Title  Pt will have decreased R knee joint line edema by 2" or > to decrease pain, maximize ROM, and maximize gait.    Baseline  3/4: down 1" to 13.25" at joint line    Time  3    Period  Weeks    Status  Partially Met        PT Long Term Goals - 04/08/17 1304      PT LONG TERM GOAL #1   Title  Pt will have imrpoved ROM to 5-120deg or better to maximize sitting tolerance and stair ambulation.    Baseline  3/4: 8-110deg    Time  6    Period  Weeks    Status  On-going      PT LONG TERM GOAL #2   Title  Pt will have at least 4+/5 or > MMT of all muscle groups tested in order to maximize gait, functional strength, and promote return to PLOF.    Baseline  3/4: see MMT    Time  6    Period  Weeks    Status  Partially Met      PT LONG TERM GOAL #3   Title  Pt will be able to perform the TUG in 10 sec or < with LRAD to demo improved functional strength and gait.    Baseline  3/4: 7.6sec no AD    Time  6    Period  Weeks    Status  Achieved      PT LONG TERM GOAL #4   Title  Pt will have improved 3MWT by 179f or > with LRAD and min to no gait deviations to demo improved overall functional strength, gait, and community ambulation.    Baseline  3/4: 7362f no AD, min gait deviations    Time  6    Period  Weeks    Status  Achieved      PT LONG TERM GOAL #5   Title  Pt will report being able to participate in her regular exercise routine and hobbies/outdoor activities without R knee pain to demo pt's return to PLOF and to maximize overall health.    Baseline  3/4: hasn't tried any of her PLOF exercises or outdoor hobbies yet    Time  6    Period  Weeks    Status  On-going            Plan - 04/08/17 1343    Clinical Impression Statement  PT reassessed pt's goals and outcome measures this date. Pt has made great progress towards goals as illustrated above. Her AROM was 8-110deg  this date, her MMT has made great improvements, and her TUG and 3MWT have significantly improved. Her R knee edema has decreased by 1" at joint line. She reports that knee flexion is her most difficult thing currently. Overall, pt has made great progress and the POC will continued as planned. Introduced pt to gluteal strengthening this date and updated HEP to include bridges and standing abd with band.     Rehab Potential  Excellent    PT Frequency  3x / week    PT Duration  6 weeks    PT Treatment/Interventions  ADLs/Self Care Home Management;Cryotherapy;Electrical Stimulation;Moist Heat;Ultrasound;DME Instruction;Gait training;Stair training;Functional mobility training;Therapeutic activities;Therapeutic exercise;Balance training;Neuromuscular re-education;Patient/family education;Orthotic Fit/Training;Manual techniques;Scar mobilization;Passive range of motion;Dry needling;Energy conservation;Taping    PT Next Visit Plan  Continue to focus on mobility and decreasing edema; manual for edema management and soft tissue restrictions. Advance ROM exercises in standing as able. Initiate scar mobilization when residual glue has cleared. continue patellar mobs; add prone TKE for improved ROM; begin more gluteal strengthening     PT Home Exercise Plan  eval: quad sets, heel slides, SAQ; 2/20: knee drives on step; 6/59: prone quad stretch, standing TKE, seated knee flexion stretch, heel and toe raises; 3/4: bridges, stand abd with RTB    Consulted and Agree with Plan of Care  Patient       Patient will benefit from skilled therapeutic intervention in order to improve the following deficits and impairments:  Abnormal gait, Decreased activity tolerance, Decreased mobility, Decreased range of motion, Decreased strength, Difficulty walking, Hypomobility, Increased edema, Increased muscle spasms, Impaired flexibility, Pain  Visit Diagnosis: Stiffness of right knee, not elsewhere classified  Muscle weakness  (generalized)  Localized edema  Difficulty in walking, not elsewhere classified     Problem List Patient Active Problem List   Diagnosis Date Noted  . Post-traumatic osteoarthritis of right knee 03/14/2017  . S/P knee surgery 08/30/2016  . History of recurrent UTIs 09/19/2015  . Osteopenia 05/21/2015  . Osteoarthritis of right hip 05/21/2015  . Arrhythmia, long term 02/15/2014  . Esophageal reflux 02/15/2014  . Chronic back pain 06/01/2013  . Change in bowel habits 11/21/2012  . Diarrhea 11/21/2012       Geraldine Solar PT, DPT  [facility] [address] [place], [place], [address] Phone: [phone]   Fax:  [phone]  Name: KEAIRRA BARDON MRN: [id number] Date of Birth: Nov 24, 1940

## 2017-04-10 ENCOUNTER — Ambulatory Visit (HOSPITAL_COMMUNITY): Payer: Medicare Other

## 2017-04-10 ENCOUNTER — Encounter (HOSPITAL_COMMUNITY): Payer: Self-pay

## 2017-04-10 DIAGNOSIS — M25661 Stiffness of right knee, not elsewhere classified: Secondary | ICD-10-CM

## 2017-04-10 DIAGNOSIS — M6281 Muscle weakness (generalized): Secondary | ICD-10-CM

## 2017-04-10 DIAGNOSIS — R262 Difficulty in walking, not elsewhere classified: Secondary | ICD-10-CM

## 2017-04-10 DIAGNOSIS — R600 Localized edema: Secondary | ICD-10-CM | POA: Diagnosis not present

## 2017-04-10 NOTE — Therapy (Signed)
[facility] [place], [place], [address] Phone: [phone]   Fax:  [phone]  Physical Therapy Treatment  Patient Details  Name: [name] MRN: [id number] Date of Birth: 1941/02/06 Referring Provider: Rod Can, MD   Encounter Date: 04/10/2017  PT End of Session - 04/10/17 1259    Visit Number  11    Number of Visits  19    Date for PT Re-Evaluation  04/29/17    Authorization Type  [employer] Medicare    Authorization Time Period  03/18/17 to 04/29/17    PT Start Time  1300    PT Stop Time  1346    PT Time Calculation (min)  46 min    Activity Tolerance  Patient tolerated treatment well;No increased pain    Behavior During Therapy  WFL for tasks assessed/performed       Past Medical History:  Diagnosis Date  . Arthritis   . Diverticulosis of colon   . Essential hypertension   . History of basal cell carcinoma (BCC) excision    02-10-2013  left cheek  s/p  moh's sx  . History of palpitations    cardiolgoist-  dr Domenic Polite  . History of recurrent UTIs   . History of syncope    2015-- felt to vasovagal response to pain (per cardiologist note)  . IBS (irritable bowel syndrome)   . Open wound of left knee    warm soaks daily /  neosprin and dressing daily / per per still has drainage  . Patellar bursitis of left knee    pre-patella septic bursitis w/ foreign body  . Right thyroid nodule    x2 right side incidental finding on carotid duplex 03/ 2018-- thyroid ultrasound done , benign , follow-up in one year  . Wears hearing aid in both ears     Past Surgical History:  Procedure Laterality Date  . APPENDECTOMY  age 61  . BREAST BIOPSY  1962   benign  . CATARACT EXTRACTION W/PHACO  06/25/2011   Procedure: CATARACT EXTRACTION PHACO AND INTRAOCULAR LENS PLACEMENT (IOC);  Surgeon: Williams Che, MD;  Location: [facility];  Service: Ophthalmology;  Laterality: Right;  CDE: 8.90  . CATARACT EXTRACTION W/PHACO Left  05/05/2012   Procedure: CATARACT EXTRACTION PHACO AND INTRAOCULAR LENS PLACEMENT (IOC);  Surgeon: Williams Che, MD;  Location: [facility];  Service: Ophthalmology;  Laterality: Left;  CDE 12.78  . COLONOSCOPY  last one 11-26-2012  . EXCISION MORTON'S NEUROMA Right 2003  approx.  . INCISION AND DRAINAGE WOUND WITH FOREIGN BODY REMOVAL Left 08/30/2016   Procedure: LEFT KNEE INCISION AND DRAINAGE WOUND WITH FOREIGN BODY REMOVAL;  Surgeon: Sydnee Cabal, MD;  Location: [facility];  Service: Orthopedics;  Laterality: Left;  . KNEE ARTHROPLASTY Right 03/14/2017   Procedure: RIGHT TOTAL KNEE ARTHROPLASTY WITH COMPUTER NAVIGATION AND REMOVAL OF HARDWARE;  Surgeon: Rod Can, MD;  Location: [facility];  Service: Orthopedics;  Laterality: Right;  Adductor Block  . KNEE ARTHROSCOPY W/ ACL RECONSTRUCTION Right 1996  . KNEE CARTILAGE SURGERY Right age 56  . MOHS SURGERY  02/10/2013   left cheek -- BCC  . POSTERIOR REPAIR  05-21-2002   dr Glo Herring   symptomatic recetocele  . TONSILLECTOMY AND ADENOIDECTOMY  age 57  . TRANSTHORACIC ECHOCARDIOGRAM  04-17-2016  dr Domenic Polite   ef 60-65%/  trivial MR/ mild TR  . VAGINAL HYSTERECTOMY  1979   w/  Bilateral Salpingoophorectomy    There were  no vitals filed for this visit.  Subjective Assessment - 04/10/17 1300    Subjective  Pt states that she is doing well. Still waiting on the swelling to go down. No pain currently.    Limitations  Sitting;Walking;House hold activities    Patient Stated Goals  to be the best that I can; get back to as close to where I was prior    Currently in Pain?  No/denies             [facility] Adult PT Treatment/Exercise - 04/10/17 0001      Knee/Hip Exercises: Stretches   Passive Hamstring Stretch  Right;3 reps;30 seconds    Passive Hamstring Stretch Limitations  12" step    Knee: Self-Stretch to increase Flexion  Right    Knee: Self-Stretch Limitations  3x 30 seconds on 12" step    Gastroc Stretch  Both;3  reps;30 seconds    Gastroc Stretch Limitations  slant board      Knee/Hip Exercises: Aerobic   Stationary Bike  x 4 mins at beginning of session for mobility, seat 6; 1/2, retro, intermittent fwd revolutions      Knee/Hip Exercises: Standing   Forward Lunges  Both;15 reps;Limitations    Forward Lunges Limitations  front foot on BOSU    Hip Abduction  Both;15 reps    Abduction Limitations  RTB    Functional Squat  2 sets;15 reps    Functional Squat Limitations  mirror during 2nd set for feedback on form    SLS with Vectors  x5RT RLE only on foam    Other Standing Knee Exercises  heel taps on 4" step x15 reps RLE only;   hip hikes on 4" step x15reps RLE only    Other Standing Knee Exercises  sidestepping 19f  x2RT with RTB      Knee/Hip Exercises: Supine   Knee Extension Limitations  6    Knee Flexion Limitations  113      Manual Therapy   Manual Therapy  Edema management;Joint mobilization    Manual therapy comments  completed separate rest of treatment    Edema Management  retro massage with BLE elevated    Joint Mobilization  sup/inf patellar mobs for improved knee ROM; Grade III AP and PA knee joint mobs for extension and flexion ROM            PT Education - 04/10/17 1259    Education provided  Yes    Education Details  exercise technique, continue HEP    Person(s) Educated  Patient    Methods  Explanation;Demonstration    Comprehension  Verbalized understanding;Returned demonstration       PT Short Term Goals - 04/08/17 1304      PT SHORT TERM GOAL #1   Title  Pt will be independent with HEP and perform consistently in order to maximize retrun to PLOF.    Time  3    Period  Weeks    Status  Achieved      PT SHORT TERM GOAL #2   Title  Pt will have improved knee AROM from 8-110deg to maximize gait and decrease pain.    Baseline  3/4: 8-110deg    Time  3    Period  Weeks    Status  Achieved      PT SHORT TERM GOAL #3   Title  Pt will have decreased R knee  joint line edema by 2" or > to decrease pain, maximize ROM, and maximize  gait.    Baseline  3/4: down 1" to 13.25" at joint line    Time  3    Period  Weeks    Status  Partially Met        PT Long Term Goals - 04/08/17 1304      PT LONG TERM GOAL #1   Title  Pt will have imrpoved ROM to 5-120deg or better to maximize sitting tolerance and stair ambulation.    Baseline  3/4: 8-110deg    Time  6    Period  Weeks    Status  On-going      PT LONG TERM GOAL #2   Title  Pt will have at least 4+/5 or > MMT of all muscle groups tested in order to maximize gait, functional strength, and promote return to PLOF.    Baseline  3/4: see MMT    Time  6    Period  Weeks    Status  Partially Met      PT LONG TERM GOAL #3   Title  Pt will be able to perform the TUG in 10 sec or < with LRAD to demo improved functional strength and gait.    Baseline  3/4: 7.6sec no AD    Time  6    Period  Weeks    Status  Achieved      PT LONG TERM GOAL #4   Title  Pt will have improved 3MWT by 139f or > with LRAD and min to no gait deviations to demo improved overall functional strength, gait, and community ambulation.    Baseline  3/4: 7367f no AD, min gait deviations    Time  6    Period  Weeks    Status  Achieved      PT LONG TERM GOAL #5   Title  Pt will report being able to participate in her regular exercise routine and hobbies/outdoor activities without R knee pain to demo pt's return to PLOF and to maximize overall health.    Baseline  3/4: hasn't tried any of her PLOF exercises or outdoor hobbies yet    Time  6    Period  Weeks    Status  On-going            Plan - 04/10/17 1348    Clinical Impression Statement  Continued to address mobility and swelling this date but did progress pt to more gluteal strengthening. No pain reported, just muscle fatigue verbalized. Added tibiofemoral joint mobs this date for improved ROM. AROM 6-113deg this date. Pt making great progress overall. Continue  as planned, progressing as able.     Rehab Potential  Excellent    PT Frequency  3x / week    PT Duration  6 weeks    PT Treatment/Interventions  ADLs/Self Care Home Management;Cryotherapy;Electrical Stimulation;Moist Heat;Ultrasound;DME Instruction;Gait training;Stair training;Functional mobility training;Therapeutic activities;Therapeutic exercise;Balance training;Neuromuscular re-education;Patient/family education;Orthotic Fit/Training;Manual techniques;Scar mobilization;Passive range of motion;Dry needling;Energy conservation;Taping    PT Next Visit Plan  Continue to focus on mobility and decreasing edema, scar mobiliztion, joint mobilizations; add prone TKE for improved ROM; continue gluteal strengthening, progress as tolerated    PT Home Exercise Plan  eval: quad sets, heel slides, SAQ; 2/20: knee drives on step; 2/4/58prone quad stretch, standing TKE, seated knee flexion stretch, heel and toe raises; 3/4: bridges, stand abd with RTB    Consulted and Agree with Plan of Care  Patient       Patient will benefit from  skilled therapeutic intervention in order to improve the following deficits and impairments:  Abnormal gait, Decreased activity tolerance, Decreased mobility, Decreased range of motion, Decreased strength, Difficulty walking, Hypomobility, Increased edema, Increased muscle spasms, Impaired flexibility, Pain  Visit Diagnosis: Stiffness of right knee, not elsewhere classified  Muscle weakness (generalized)  Localized edema  Difficulty in walking, not elsewhere classified     Problem List Patient Active Problem List   Diagnosis Date Noted  . Post-traumatic osteoarthritis of right knee 03/14/2017  . S/P knee surgery 08/30/2016  . History of recurrent UTIs 09/19/2015  . Osteopenia 05/21/2015  . Osteoarthritis of right hip 05/21/2015  . Arrhythmia, long term 02/15/2014  . Esophageal reflux 02/15/2014  . Chronic back pain 06/01/2013  . Change in bowel habits 11/21/2012   . Diarrhea 11/21/2012       Geraldine Solar PT, DPT  [facility] [address] [place], [place], [address] Phone: [phone]   Fax:  [phone]  Name: [name] MRN: [id number] Date of Birth: Jun 08, 1940

## 2017-04-12 ENCOUNTER — Ambulatory Visit (HOSPITAL_COMMUNITY): Payer: Medicare Other

## 2017-04-12 ENCOUNTER — Encounter (HOSPITAL_COMMUNITY): Payer: Self-pay

## 2017-04-12 DIAGNOSIS — R262 Difficulty in walking, not elsewhere classified: Secondary | ICD-10-CM | POA: Diagnosis not present

## 2017-04-12 DIAGNOSIS — M25661 Stiffness of right knee, not elsewhere classified: Secondary | ICD-10-CM

## 2017-04-12 DIAGNOSIS — M6281 Muscle weakness (generalized): Secondary | ICD-10-CM | POA: Diagnosis not present

## 2017-04-12 DIAGNOSIS — R600 Localized edema: Secondary | ICD-10-CM

## 2017-04-12 NOTE — Therapy (Signed)
[facility] [address] , [place], [address] Phone: [phone]   Fax:  [phone]  Physical Therapy Treatment  Patient Details  Name: [name] MRN: [id number] Date of Birth: 08/20/1940 Referring Provider: Brian Swinteck, MD   Encounter Date: 04/12/2017  PT End of Session - 04/12/17 1358    Visit Number  12    Number of Visits  19    Date for PT Re-Evaluation  04/29/17    Authorization Type  [employer] Medicare    Authorization Time Period  03/18/17 to 04/29/17    PT Start Time  1351    PT Stop Time  1435    PT Time Calculation (min)  44 min    Activity Tolerance  Patient tolerated treatment well;No increased pain    Behavior During Therapy  WFL for tasks assessed/performed       Past Medical History:  Diagnosis Date  . Arthritis   . Diverticulosis of colon   . Essential hypertension   . History of basal cell carcinoma (BCC) excision    02-10-2013  left cheek  s/p  moh's sx  . History of palpitations    cardiolgoist-  dr mcdowell  . History of recurrent UTIs   . History of syncope    2015-- felt to vasovagal response to pain (per cardiologist note)  . IBS (irritable bowel syndrome)   . Open wound of left knee    warm soaks daily /  neosprin and dressing daily / per per still has drainage  . Patellar bursitis of left knee    pre-patella septic bursitis w/ foreign body  . Right thyroid nodule    x2 right side incidental finding on carotid duplex 03/ 2018-- thyroid ultrasound done , benign , follow-up in one year  . Wears hearing aid in both ears     Past Surgical History:  Procedure Laterality Date  . APPENDECTOMY  age 15  . BREAST BIOPSY  1962   benign  . CATARACT EXTRACTION W/PHACO  06/25/2011   Procedure: CATARACT EXTRACTION PHACO AND INTRAOCULAR LENS PLACEMENT (IOC);  Surgeon: Carroll F Haines, MD;  Location: [facility];  Service: Ophthalmology;  Laterality: Right;  CDE: 8.90  . CATARACT EXTRACTION W/PHACO Left  05/05/2012   Procedure: CATARACT EXTRACTION PHACO AND INTRAOCULAR LENS PLACEMENT (IOC);  Surgeon: Carroll F Haines, MD;  Location: [facility];  Service: Ophthalmology;  Laterality: Left;  CDE 12.78  . COLONOSCOPY  last one 11-26-2012  . EXCISION MORTON'S NEUROMA Right 2003  approx.  . INCISION AND DRAINAGE WOUND WITH FOREIGN BODY REMOVAL Left 08/30/2016   Procedure: LEFT KNEE INCISION AND DRAINAGE WOUND WITH FOREIGN BODY REMOVAL;  Surgeon: Collins, Robert, MD;  Location: [facility];  Service: Orthopedics;  Laterality: Left;  . KNEE ARTHROPLASTY Right 03/14/2017   Procedure: RIGHT TOTAL KNEE ARTHROPLASTY WITH COMPUTER NAVIGATION AND REMOVAL OF HARDWARE;  Surgeon: Swinteck, Brian, MD;  Location: [facility];  Service: Orthopedics;  Laterality: Right;  Adductor Block  . KNEE ARTHROSCOPY W/ ACL RECONSTRUCTION Right 1996  . KNEE CARTILAGE SURGERY Right age 15  . MOHS SURGERY  02/10/2013   left cheek -- BCC  . POSTERIOR REPAIR  05-21-2002   dr ferguson   symptomatic recetocele  . TONSILLECTOMY AND ADENOIDECTOMY  age 3  . TRANSTHORACIC ECHOCARDIOGRAM  04-17-2016  dr mcdowell   ef 60-65%/  trivial MR/ mild TR  . VAGINAL HYSTERECTOMY  1979   w/  Bilateral Salpingoophorectomy    There were   no vitals filed for this visit.  Subjective Assessment - 04/12/17 1355    Subjective  Pt stated she feels like she has made it over the hump.  No pain until end range with flexion.  Reports she slept through all night, woke once but not due to pain.      Patient Stated Goals  to be the best that I can; get back to as close to where I was prior    Currently in Pain?  No/denies                      [facility] Adult PT Treatment/Exercise - 04/12/17 0001      Knee/Hip Exercises: Stretches   Active Hamstring Stretch  3 reps;30 seconds    Gastroc Stretch  Both;3 reps;30 seconds    Gastroc Stretch Limitations  slant board      Knee/Hip Exercises: Aerobic   Stationary Bike  x 4 mins at beginning of  session for mobility, seat 6; 1/2, retro, intermittent fwd revolutions      Knee/Hip Exercises: Standing   Forward Lunges  Both;15 reps;Limitations    Forward Lunges Limitations  front foot on BOSU, no HHA    Functional Squat  15 reps    Functional Squat Limitations  improved mechanics    Stairs  3RT 7in step reciprocal pattern 1RT    SLS with Vectors  x5RT RLE only on foam    Other Standing Knee Exercises  sidestepping 15ft  x2RT with RTB      Knee/Hip Exercises: Supine   Short Arc Quad Sets  Right;15 reps    Short Arc Quad Sets Limitations  5" holds    Knee Extension  AROM    Knee Extension Limitations  6    Knee Flexion  AROM    Knee Flexion Limitations  118      Knee/Hip Exercises: Prone   Other Prone Exercises  TKE 10x 5" holds      Manual Therapy   Manual Therapy  Edema management;Joint mobilization;Myofascial release    Manual therapy comments  completed separate rest of treatment    Edema Management  retro massage with BLE elevated    Joint Mobilization  sup/inf patellar mobs for improved knee ROM; Grade III AP and PA knee joint mobs for extension and flexion ROM    Myofascial Release  scar tissue massage to reduce adhesions               PT Short Term Goals - 04/08/17 1304      PT SHORT TERM GOAL #1   Title  Pt will be independent with HEP and perform consistently in order to maximize retrun to PLOF.    Time  3    Period  Weeks    Status  Achieved      PT SHORT TERM GOAL #2   Title  Pt will have improved knee AROM from 8-110deg to maximize gait and decrease pain.    Baseline  3/4: 8-110deg    Time  3    Period  Weeks    Status  Achieved      PT SHORT TERM GOAL #3   Title  Pt will have decreased R knee joint line edema by 2" or > to decrease pain, maximize ROM, and maximize gait.    Baseline  3/4: down 1" to 13.25" at joint line    Time  3    Period  Weeks    Status    Partially Met        PT Long Term Goals - 04/08/17 1304      PT LONG TERM  GOAL #1   Title  Pt will have imrpoved ROM to 5-120deg or better to maximize sitting tolerance and stair ambulation.    Baseline  3/4: 8-110deg    Time  6    Period  Weeks    Status  On-going      PT LONG TERM GOAL #2   Title  Pt will have at least 4+/5 or > MMT of all muscle groups tested in order to maximize gait, functional strength, and promote return to PLOF.    Baseline  3/4: see MMT    Time  6    Period  Weeks    Status  Partially Met      PT LONG TERM GOAL #3   Title  Pt will be able to perform the TUG in 10 sec or < with LRAD to demo improved functional strength and gait.    Baseline  3/4: 7.6sec no AD    Time  6    Period  Weeks    Status  Achieved      PT LONG TERM GOAL #4   Title  Pt will have improved 3MWT by 100ft or > with LRAD and min to no gait deviations to demo improved overall functional strength, gait, and community ambulation.    Baseline  3/4: 730ft, no AD, min gait deviations    Time  6    Period  Weeks    Status  Achieved      PT LONG TERM GOAL #5   Title  Pt will report being able to participate in her regular exercise routine and hobbies/outdoor activities without R knee pain to demo pt's return to PLOF and to maximize overall health.    Baseline  3/4: hasn't tried any of her PLOF exercises or outdoor hobbies yet    Time  6    Period  Weeks    Status  On-going            Plan - 04/12/17 1624    Clinical Impression Statement  Continued session focus with knee mobility and progressed proximal strengthening.  Began session wtih manual techniques to address edema present proximal knee.  Pt's husband in room and educated on technique to begin at home.  Progressed functional strengthening with stair training, pt able to demonstrate reciprocal pattern, increased difficulty with descending stairs due to weakness and mobility.  AROM improving with flexion (6-118 degrees today was 113 degrees flexion last session).  Pt continues to have difficulty with knee  extension due partly to edema present and weakness with isometric quadricep contraction wihtout gluteal activation.  No reports of pain through session.      Rehab Potential  Excellent    PT Frequency  3x / week    PT Duration  6 weeks    PT Treatment/Interventions  ADLs/Self Care Home Management;Cryotherapy;Electrical Stimulation;Moist Heat;Ultrasound;DME Instruction;Gait training;Stair training;Functional mobility training;Therapeutic activities;Therapeutic exercise;Balance training;Neuromuscular re-education;Patient/family education;Orthotic Fit/Training;Manual techniques;Scar mobilization;Passive range of motion;Dry needling;Energy conservation;Taping    PT Next Visit Plan  Continue to focus on mobility with manual technqiues to address edema, scar tissue and joint mobilizations to improve ROM.  Continue gluteal strengthening, progress as able.  Begin prone knee hang next session with manual to assess/address hamstring tightness for extension.      PT Home Exercise Plan  eval: quad sets, heel slides, SAQ; 2/20: knee   drives on step; 2/27: prone quad stretch, standing TKE, seated knee flexion stretch, heel and toe raises; 3/4: bridges, stand abd with RTB       Patient will benefit from skilled therapeutic intervention in order to improve the following deficits and impairments:  Abnormal gait, Decreased activity tolerance, Decreased mobility, Decreased range of motion, Decreased strength, Difficulty walking, Hypomobility, Increased edema, Increased muscle spasms, Impaired flexibility, Pain  Visit Diagnosis: Stiffness of right knee, not elsewhere classified  Muscle weakness (generalized)  Localized edema  Difficulty in walking, not elsewhere classified     Problem List Patient Active Problem List   Diagnosis Date Noted  . Post-traumatic osteoarthritis of right knee 03/14/2017  . S/P knee surgery 08/30/2016  . History of recurrent UTIs 09/19/2015  . Osteopenia 05/21/2015  .  Osteoarthritis of right hip 05/21/2015  . Arrhythmia, long term 02/15/2014  . Esophageal reflux 02/15/2014  . Chronic back pain 06/01/2013  . Change in bowel habits 11/21/2012  . Diarrhea 11/21/2012   Casey Cockerham, LPTA; CBIS [phone]  Cockerham, Casey Jo 04/12/2017, 4:31 PM  [facility] [address] [place], [place], [address] Phone: [phone]   Fax:  [phone]  Name: [name] MRN: [id number] Date of Birth: 06/05/1940   

## 2017-04-15 ENCOUNTER — Other Ambulatory Visit: Payer: Self-pay

## 2017-04-15 ENCOUNTER — Ambulatory Visit (HOSPITAL_COMMUNITY): Payer: Medicare Other

## 2017-04-15 ENCOUNTER — Telehealth (HOSPITAL_COMMUNITY): Payer: Self-pay

## 2017-04-15 ENCOUNTER — Encounter (HOSPITAL_COMMUNITY): Payer: Self-pay

## 2017-04-15 DIAGNOSIS — M6281 Muscle weakness (generalized): Secondary | ICD-10-CM | POA: Diagnosis not present

## 2017-04-15 DIAGNOSIS — R600 Localized edema: Secondary | ICD-10-CM | POA: Diagnosis not present

## 2017-04-15 DIAGNOSIS — R262 Difficulty in walking, not elsewhere classified: Secondary | ICD-10-CM

## 2017-04-15 DIAGNOSIS — M25661 Stiffness of right knee, not elsewhere classified: Secondary | ICD-10-CM | POA: Diagnosis not present

## 2017-04-15 NOTE — Therapy (Signed)
[place], [place], [address] Phone: [phone]   Fax:  [phone]  Physical Therapy Treatment  Patient Details  Name: [name] MRN: [id number] Date of Birth: Jun 19, 1940 Referring Provider: Rod Can, MD   Encounter Date: 04/15/2017  PT End of Session - 04/15/17 1304    Visit Number  13    Number of Visits  19    Date for PT Re-Evaluation  04/29/17    Authorization Type  [employer] Medicare    Authorization Time Period  03/18/17 to 04/29/17    PT Start Time  1304    PT Stop Time  1348    PT Time Calculation (min)  44 min    Activity Tolerance  Patient tolerated treatment well;No increased pain    Behavior During Therapy  WFL for tasks assessed/performed       Past Medical History:  Diagnosis Date  . Arthritis   . Diverticulosis of colon   . Essential hypertension   . History of basal cell carcinoma (BCC) excision    02-10-2013  left cheek  s/p  moh's sx  . History of palpitations    cardiolgoist-  dr Domenic Polite  . History of recurrent UTIs   . History of syncope    2015-- felt to vasovagal response to pain (per cardiologist note)  . IBS (irritable bowel syndrome)   . Open wound of left knee    warm soaks daily /  neosprin and dressing daily / per per still has drainage  . Patellar bursitis of left knee    pre-patella septic bursitis w/ foreign body  . Right thyroid nodule    x2 right side incidental finding on carotid duplex 03/ 2018-- thyroid ultrasound done , benign , follow-up in one year  . Wears hearing aid in both ears     Past Surgical History:  Procedure Laterality Date  . APPENDECTOMY  age 20  . BREAST BIOPSY  1962   benign  . CATARACT EXTRACTION W/PHACO  06/25/2011   Procedure: CATARACT EXTRACTION PHACO AND INTRAOCULAR LENS PLACEMENT (IOC);  Surgeon: Williams Che, MD;  Location: [facility];  Service: Ophthalmology;  Laterality: Right;  CDE: 8.90  . CATARACT EXTRACTION W/PHACO Left  05/05/2012   Procedure: CATARACT EXTRACTION PHACO AND INTRAOCULAR LENS PLACEMENT (IOC);  Surgeon: Williams Che, MD;  Location: [facility];  Service: Ophthalmology;  Laterality: Left;  CDE 12.78  . COLONOSCOPY  last one 11-26-2012  . EXCISION MORTON'S NEUROMA Right 2003  approx.  . INCISION AND DRAINAGE WOUND WITH FOREIGN BODY REMOVAL Left 08/30/2016   Procedure: LEFT KNEE INCISION AND DRAINAGE WOUND WITH FOREIGN BODY REMOVAL;  Surgeon: Sydnee Cabal, MD;  Location: [facility];  Service: Orthopedics;  Laterality: Left;  . KNEE ARTHROPLASTY Right 03/14/2017   Procedure: RIGHT TOTAL KNEE ARTHROPLASTY WITH COMPUTER NAVIGATION AND REMOVAL OF HARDWARE;  Surgeon: Rod Can, MD;  Location: [facility];  Service: Orthopedics;  Laterality: Right;  Adductor Block  . KNEE ARTHROSCOPY W/ ACL RECONSTRUCTION Right 1996  . KNEE CARTILAGE SURGERY Right age 9  . MOHS SURGERY  02/10/2013   left cheek -- BCC  . POSTERIOR REPAIR  05-21-2002   dr Glo Herring   symptomatic recetocele  . TONSILLECTOMY AND ADENOIDECTOMY  age 89  . TRANSTHORACIC ECHOCARDIOGRAM  04-17-2016  dr Domenic Polite   ef 60-65%/  trivial MR/ mild TR  . VAGINAL HYSTERECTOMY  1979   w/  Bilateral Salpingoophorectomy    There were  no vitals filed for this visit.  Subjective Assessment - 04/15/17 1309    Subjective  Patient states she "paid" for pushing her knee flexion last session and was sore on Saturday but it has gotten better since then. She reports she was able to do her HEP over the weekend and reports she does it every day that she does not have PT. She states she is concerned that she is still having so much swelling in her right knee and states she has been elevating and icing every day after activity and elevating at night before bed.     Limitations  Sitting;Walking;House hold activities    Patient Stated Goals  to be the best that I can; get back to as close to where I was prior    Currently in Pain?  No/denies          [facility] Adult PT Treatment/Exercise - 04/15/17 0001      Knee/Hip Exercises: Aerobic   Stationary Bike  x 4 mins at beginning of session for mobility, seat 6; 1/2, retro, intermittent fwd revolutions      Knee/Hip Exercises: Supine   Quad Sets  3 sets;5 reps;Limitations;Right    Quad Sets Limitations  5 seconds    Heel Slides  Right;3 sets;5 reps    Knee Extension  AROM    Knee Extension Limitations  4    Knee Flexion  AROM    Knee Flexion Limitations  115      Manual Therapy   Manual Therapy  Edema management;Joint mobilization;Myofascial release;Other (comment)    Manual therapy comments  completed separate rest of treatment    Edema Management  retro massage to Lt LE elevated    Joint Mobilization  patellar mobs all dierction, PA/AP glide to tibfem joint, 3x 30-45 seconds each, for improved knee ROM; Grade III AP and PA knee joint mobs for extension and flexion ROM    Other Manual Therapy  Measured for compression garment        PT Education - 04/15/17 1310    Education provided  Yes    Education Details  Educated on exercises throughout session. Educated on compression garment for edema and provided handout to order garment form [place] [employer] at [employer].    Person(s) Educated  Patient    Methods  Explanation;Handout    Comprehension  Verbalized understanding       PT Short Term Goals - 04/08/17 1304      PT SHORT TERM GOAL #1   Title  Pt will be independent with HEP and perform consistently in order to maximize retrun to PLOF.    Time  3    Period  Weeks    Status  Achieved      PT SHORT TERM GOAL #2   Title  Pt will have improved knee AROM from 8-110deg to maximize gait and decrease pain.    Baseline  3/4: 8-110deg    Time  3    Period  Weeks    Status  Achieved      PT SHORT TERM GOAL #3   Title  Pt will have decreased R knee joint line edema by 2" or > to decrease pain, maximize ROM, and maximize gait.    Baseline  3/4: down 1" to 13.25" at joint  line    Time  3    Period  Weeks    Status  Partially Met        PT Long Term Goals - 04/08/17  [place] #1   Title  Pt will have imrpoved ROM to 5-120deg or better to maximize sitting tolerance and stair ambulation.    Baseline  3/4: 8-110deg    Time  6    Period  Weeks    Status  On-going      PT LONG TERM GOAL #2   Title  Pt will have at least 4+/5 or > MMT of all muscle groups tested in order to maximize gait, functional strength, and promote return to PLOF.    Baseline  3/4: see MMT    Time  6    Period  Weeks    Status  Partially Met      PT LONG TERM GOAL #3   Title  Pt will be able to perform the TUG in 10 sec or < with LRAD to demo improved functional strength and gait.    Baseline  3/4: 7.6sec no AD    Time  6    Period  Weeks    Status  Achieved      PT LONG TERM GOAL #4   Title  Pt will have improved 3MWT by 154f or > with LRAD and min to no gait deviations to demo improved overall functional strength, gait, and community ambulation.    Baseline  3/4: 7331f no AD, min gait deviations    Time  6    Period  Weeks    Status  Achieved      PT LONG TERM GOAL #5   Title  Pt will report being able to participate in her regular exercise routine and hobbies/outdoor activities without R knee pain to demo pt's return to PLOF and to maximize overall health.    Baseline  3/4: hasn't tried any of her PLOF exercises or outdoor hobbies yet    Time  6    Period  Weeks    Status  On-going        Plan - 04/15/17 1307    Clinical Impression Statement  Session focused on ROM and manual therapy for edema management and joint mobility. Patient has ongoing edema that is indurated around right knee joint and will benefit from compression garment. She was measured for garment and instructed on ordering. Patient denied pain throughout session and following joint mobilization her ROM was 4-115 degrees. She will continue to benefit from skilled PT services to  address impairments and progress ROM to achieve goals and more normalized mobility.     Rehab Potential  Excellent    PT Frequency  3x / week    PT Duration  6 weeks    PT Treatment/Interventions  ADLs/Self Care Home Management;Cryotherapy;Electrical Stimulation;Moist Heat;Ultrasound;DME Instruction;Gait training;Stair training;Functional mobility training;Therapeutic activities;Therapeutic exercise;Balance training;Neuromuscular re-education;Patient/family education;Orthotic Fit/Training;Manual techniques;Scar mobilization;Passive range of motion;Dry needling;Energy conservation;Taping    PT Next Visit Plan  Follow up about compression garment and focus on mobility with manual technqiues to address edema, scar tissue and joint mobilizations to improve ROM.  Continue gluteal strengthening, progress as able.  Begin prone knee hang next session with manual to assess/address hamstring tightness for extension.      PT Home Exercise Plan  eval: quad sets, heel slides, SAQ; 2/20: knee drives on step; 2/8/30prone quad stretch, standing TKE, seated knee flexion stretch, heel and toe raises; 3/4: bridges, stand abd with RTB    Consulted and Agree with Plan of Care  Patient       Patient will benefit  from skilled therapeutic intervention in order to improve the following deficits and impairments:  Abnormal gait, Decreased activity tolerance, Decreased mobility, Decreased range of motion, Decreased strength, Difficulty walking, Hypomobility, Increased edema, Increased muscle spasms, Impaired flexibility, Pain  Visit Diagnosis: Stiffness of right knee, not elsewhere classified  Muscle weakness (generalized)  Localized edema  Difficulty in walking, not elsewhere classified     Problem List Patient Active Problem List   Diagnosis Date Noted  . Post-traumatic osteoarthritis of right knee 03/14/2017  . S/P knee surgery 08/30/2016  . History of recurrent UTIs 09/19/2015  . Osteopenia 05/21/2015  .  Osteoarthritis of right hip 05/21/2015  . Arrhythmia, long term 02/15/2014  . Esophageal reflux 02/15/2014  . Chronic back pain 06/01/2013  . Change in bowel habits 11/21/2012  . Diarrhea 11/21/2012    Kipp Brood, PT, DPT Physical Therapist with [facility]  04/15/2017 4:01 PM    [facility] [address] [place], [place], [address] Phone: [phone]   Fax:  [phone]  Name: [name] MRN: [id number] Date of Birth: 1940/03/25

## 2017-04-15 NOTE — Telephone Encounter (Signed)
I called to offer the patient a 11:15 appointment as we had an opening, however the patient would like to keep her 1:00 PM appointment for today.  Kipp Brood, PT, DPT Physical Therapist with [facility]  04/15/2017 10:41 AM

## 2017-04-16 DIAGNOSIS — L821 Other seborrheic keratosis: Secondary | ICD-10-CM | POA: Diagnosis not present

## 2017-04-16 DIAGNOSIS — D229 Melanocytic nevi, unspecified: Secondary | ICD-10-CM | POA: Diagnosis not present

## 2017-04-17 ENCOUNTER — Ambulatory Visit (HOSPITAL_COMMUNITY): Payer: Medicare Other

## 2017-04-17 ENCOUNTER — Encounter (HOSPITAL_COMMUNITY): Payer: Self-pay

## 2017-04-17 DIAGNOSIS — R262 Difficulty in walking, not elsewhere classified: Secondary | ICD-10-CM

## 2017-04-17 DIAGNOSIS — M6281 Muscle weakness (generalized): Secondary | ICD-10-CM

## 2017-04-17 DIAGNOSIS — R600 Localized edema: Secondary | ICD-10-CM

## 2017-04-17 DIAGNOSIS — M25661 Stiffness of right knee, not elsewhere classified: Secondary | ICD-10-CM | POA: Diagnosis not present

## 2017-04-17 NOTE — Therapy (Signed)
[place], [place], [address] Phone: [phone]   Fax:  [phone]  Physical Therapy Treatment  Patient Details  Name: [name] MRN: [id number] Date of Birth: 01/16/1941 Referring Provider: Rod Can, MD   Encounter Date: 04/17/2017  PT End of Session - 04/17/17 1343    Visit Number  14    Number of Visits  19    Date for PT Re-Evaluation  04/29/17    Authorization Type  [employer] Medicare    Authorization Time Period  03/18/17 to 04/29/17    PT Start Time  1300    PT Stop Time  1344    PT Time Calculation (min)  44 min    Activity Tolerance  Patient tolerated treatment well;No increased pain    Behavior During Therapy  WFL for tasks assessed/performed       Past Medical History:  Diagnosis Date  . Arthritis   . Diverticulosis of colon   . Essential hypertension   . History of basal cell carcinoma (BCC) excision    02-10-2013  left cheek  s/p  moh's sx  . History of palpitations    cardiolgoist-  dr Domenic Polite  . History of recurrent UTIs   . History of syncope    2015-- felt to vasovagal response to pain (per cardiologist note)  . IBS (irritable bowel syndrome)   . Open wound of left knee    warm soaks daily /  neosprin and dressing daily / per per still has drainage  . Patellar bursitis of left knee    pre-patella septic bursitis w/ foreign body  . Right thyroid nodule    x2 right side incidental finding on carotid duplex 03/ 2018-- thyroid ultrasound done , benign , follow-up in one year  . Wears hearing aid in both ears     Past Surgical History:  Procedure Laterality Date  . APPENDECTOMY  age 77  . BREAST BIOPSY  1962   benign  . CATARACT EXTRACTION W/PHACO  06/25/2011   Procedure: CATARACT EXTRACTION PHACO AND INTRAOCULAR LENS PLACEMENT (IOC);  Surgeon: Williams Che, MD;  Location: [facility];  Service: Ophthalmology;  Laterality: Right;  CDE: 8.90  . CATARACT EXTRACTION W/PHACO Left  05/05/2012   Procedure: CATARACT EXTRACTION PHACO AND INTRAOCULAR LENS PLACEMENT (IOC);  Surgeon: Williams Che, MD;  Location: [facility];  Service: Ophthalmology;  Laterality: Left;  CDE 12.78  . COLONOSCOPY  last one 11-26-2012  . EXCISION MORTON'S NEUROMA Right 2003  approx.  . INCISION AND DRAINAGE WOUND WITH FOREIGN BODY REMOVAL Left 08/30/2016   Procedure: LEFT KNEE INCISION AND DRAINAGE WOUND WITH FOREIGN BODY REMOVAL;  Surgeon: Sydnee Cabal, MD;  Location: [facility];  Service: Orthopedics;  Laterality: Left;  . KNEE ARTHROPLASTY Right 03/14/2017   Procedure: RIGHT TOTAL KNEE ARTHROPLASTY WITH COMPUTER NAVIGATION AND REMOVAL OF HARDWARE;  Surgeon: Rod Can, MD;  Location: [facility];  Service: Orthopedics;  Laterality: Right;  Adductor Block  . KNEE ARTHROSCOPY W/ ACL RECONSTRUCTION Right 1996  . KNEE CARTILAGE SURGERY Right age 77  . MOHS SURGERY  02/10/2013   left cheek -- BCC  . POSTERIOR REPAIR  05-21-2002   dr Glo Herring   symptomatic recetocele  . TONSILLECTOMY AND ADENOIDECTOMY  age 77  . TRANSTHORACIC ECHOCARDIOGRAM  04-17-2016  dr Domenic Polite   ef 60-65%/  trivial MR/ mild TR  . VAGINAL HYSTERECTOMY  1979   w/  Bilateral Salpingoophorectomy    There were  no vitals filed for this visit.  Subjective Assessment - 04/17/17 1302    Subjective  Pt stated she felt good with the manual last session, no real pain but does feel a pull with flexion.  Stated husband has began retro massage at home     Patient Stated Goals  to be the best that I can; get back to as close to where I was prior    Currently in Pain?  No/denies    Pain Descriptors / Indicators  Tightness                      [facility] Adult PT Treatment/Exercise - 04/17/17 0001      Knee/Hip Exercises: Aerobic   Stationary Bike  x 3 mins at beginning of session for mobility, seat 6; fwd revolutions      Knee/Hip Exercises: Standing   Terminal Knee Extension  AROM;Strengthening;Right;1 set;15  reps;Limitations    Terminal Knee Extension Limitations  10x10" BTB    Lateral Step Up  15 reps;Hand Hold: 0;Step Height: 6"    Functional Squat  20 reps    Functional Squat Limitations  good mechanics    Stairs  3RT 7in step reciprocal pattern 1RT      Knee/Hip Exercises: Supine   Heel Slides  10 reps    Knee Extension  AROM    Knee Extension Limitations  2 was 4    Knee Flexion  AROM    Knee Flexion Limitations  118 was 115      Knee/Hip Exercises: Prone   Prone Knee Hang  3 minutes Manual to calves and hamstrings      Manual Therapy   Manual Therapy  Edema management;Joint mobilization;Myofascial release;Other (comment)    Manual therapy comments  completed separate rest of treatment    Edema Management  retro massage to Lt LE elevated    Joint Mobilization  patellar mobs all dierction, PA/AP glide to tibfem joint, 3x 30-45 seconds each, for improved knee ROM; Grade III AP and PA knee joint mobs for extension and flexion ROM    Myofascial Release  scar tissue massage to reduce adhesions               PT Short Term Goals - 04/08/17 1304      PT SHORT TERM GOAL #1   Title  Pt will be independent with HEP and perform consistently in order to maximize retrun to PLOF.    Time  3    Period  Weeks    Status  Achieved      PT SHORT TERM GOAL #2   Title  Pt will have improved knee AROM from 8-110deg to maximize gait and decrease pain.    Baseline  3/4: 8-110deg    Time  3    Period  Weeks    Status  Achieved      PT SHORT TERM GOAL #3   Title  Pt will have decreased R knee joint line edema by 2" or > to decrease pain, maximize ROM, and maximize gait.    Baseline  3/4: down 1" to 13.25" at joint line    Time  3    Period  Weeks    Status  Partially Met        PT Long Term Goals - 04/08/17 1304      PT LONG TERM GOAL #1   Title  Pt will have imrpoved ROM to 5-120deg or better to maximize sitting tolerance  and stair ambulation.    Baseline  3/4: 8-110deg    Time   6    Period  Weeks    Status  On-going      PT LONG TERM GOAL #2   Title  Pt will have at least 4+/5 or > MMT of all muscle groups tested in order to maximize gait, functional strength, and promote return to PLOF.    Baseline  3/4: see MMT    Time  6    Period  Weeks    Status  Partially Met      PT LONG TERM GOAL #3   Title  Pt will be able to perform the TUG in 10 sec or < with LRAD to demo improved functional strength and gait.    Baseline  3/4: 7.6sec no AD    Time  6    Period  Weeks    Status  Achieved      PT LONG TERM GOAL #4   Title  Pt will have improved 3MWT by 133f or > with LRAD and min to no gait deviations to demo improved overall functional strength, gait, and community ambulation.    Baseline  3/4: 7326f no AD, min gait deviations    Time  6    Period  Weeks    Status  Achieved      PT LONG TERM GOAL #5   Title  Pt will report being able to participate in her regular exercise routine and hobbies/outdoor activities without R knee pain to demo pt's return to PLOF and to maximize overall health.    Baseline  3/4: hasn't tried any of her PLOF exercises or outdoor hobbies yet    Time  6    Period  Weeks    Status  On-going            Plan - 04/17/17 1344    Clinical Impression Statement  Pt progressing well toward POC.  Continued session focus with knee mobilty and functional strengthening.  Pt continues to have edema present proximal knee, manual technqiues complete to assist with edema control and myofascial techniques to address scar tissue adhesions.  Pt reports she has ordered pair of thigh high compression hose and should receive this week, reviewed techniques to assist with donning garmet and proper wear times.  Added prone knee hang to assist with extension with manual soft tissue mobilizaiton, minimal tightness palpated gastroc/hamstrings.  Improved AROM 2-118 degrees.  Pt improved confidence with functional strengthening tasks, most difficutly  descending stairs due to eccentric control weakness.  No reports of pain through session.      Rehab Potential  Excellent    PT Frequency  3x / week    PT Duration  6 weeks    PT Treatment/Interventions  ADLs/Self Care Home Management;Cryotherapy;Electrical Stimulation;Moist Heat;Ultrasound;DME Instruction;Gait training;Stair training;Functional mobility training;Therapeutic activities;Therapeutic exercise;Balance training;Neuromuscular re-education;Patient/family education;Orthotic Fit/Training;Manual techniques;Scar mobilization;Passive range of motion;Dry needling;Energy conservation;Taping    PT Next Visit Plan  Follow up about compression garment.  Continue with manual technqiues to address edema, scar tissue and joint mobilizations.  ROM at 2-118, progress gluteal strenghtening and functional strengthening, balance.      PT Home Exercise Plan  eval: quad sets, heel slides, SAQ; 2/20: knee drives on step; 2/4/40prone quad stretch, standing TKE, seated knee flexion stretch, heel and toe raises; 3/4: bridges, stand abd with RTB       Patient will benefit from skilled therapeutic intervention in order to improve the following  deficits and impairments:  Abnormal gait, Decreased activity tolerance, Decreased mobility, Decreased range of motion, Decreased strength, Difficulty walking, Hypomobility, Increased edema, Increased muscle spasms, Impaired flexibility, Pain  Visit Diagnosis: Stiffness of right knee, not elsewhere classified  Muscle weakness (generalized)  Localized edema  Difficulty in walking, not elsewhere classified     Problem List Patient Active Problem List   Diagnosis Date Noted  . Post-traumatic osteoarthritis of right knee 03/14/2017  . S/P knee surgery 08/30/2016  . History of recurrent UTIs 09/19/2015  . Osteopenia 05/21/2015  . Osteoarthritis of right hip 05/21/2015  . Arrhythmia, long term 02/15/2014  . Esophageal reflux 02/15/2014  . Chronic back pain  06/01/2013  . Change in bowel habits 11/21/2012  . Diarrhea 11/21/2012   Ihor Austin, [place]; [place]  Aldona Lento 04/17/2017, 4:48 PM  [facility] [address] [place], [place], [address] Phone: [phone]   Fax:  [phone]  Name: MAHROSH DONNELL MRN: [id number] Date of Birth: 01/12/1941

## 2017-04-18 DIAGNOSIS — H04223 Epiphora due to insufficient drainage, bilateral lacrimal glands: Secondary | ICD-10-CM | POA: Diagnosis not present

## 2017-04-18 DIAGNOSIS — H04222 Epiphora due to insufficient drainage, left lacrimal gland: Secondary | ICD-10-CM | POA: Diagnosis not present

## 2017-04-18 DIAGNOSIS — H04552 Acquired stenosis of left nasolacrimal duct: Secondary | ICD-10-CM | POA: Diagnosis not present

## 2017-04-18 DIAGNOSIS — H04221 Epiphora due to insufficient drainage, right lacrimal gland: Secondary | ICD-10-CM | POA: Diagnosis not present

## 2017-04-18 DIAGNOSIS — H04551 Acquired stenosis of right nasolacrimal duct: Secondary | ICD-10-CM | POA: Diagnosis not present

## 2017-04-19 ENCOUNTER — Ambulatory Visit (HOSPITAL_COMMUNITY): Payer: Medicare Other

## 2017-04-19 ENCOUNTER — Encounter (HOSPITAL_COMMUNITY): Payer: Self-pay

## 2017-04-19 DIAGNOSIS — M25661 Stiffness of right knee, not elsewhere classified: Secondary | ICD-10-CM

## 2017-04-19 DIAGNOSIS — R262 Difficulty in walking, not elsewhere classified: Secondary | ICD-10-CM

## 2017-04-19 DIAGNOSIS — R600 Localized edema: Secondary | ICD-10-CM | POA: Diagnosis not present

## 2017-04-19 DIAGNOSIS — M6281 Muscle weakness (generalized): Secondary | ICD-10-CM | POA: Diagnosis not present

## 2017-04-19 NOTE — Patient Instructions (Addendum)
  Mini Squats  Stand with feet as wide as shoulder width. Push hips back and bend knees while keeping chest upright. Arms may be kept in front to maintain balance more easily. A slight bend in knees (approximately 30 degrees) should be achieved, before returning to standing position.   lateral walk at pole or counter   Standing at a support surface (role or counter top) use hands to assist with balance. Perform sideways walking down and back until you feel that you need to take break.   It may be help to place a chair close to allow for safe rest breaks.    WALL SQUATS  Leaning up against a wall or closed door on your back, slide your body downward and then return back to upright position.  A door was used here because it was smoother and had less friction than the wall.   Knees should bend in line with the 2nd toe and not pass the front of the foot.   SINGLE LEG STANCE - SLS  Stand on one leg and maintain your balance. Kick opposite leg forwards, sideways, and backwards holding for 3-5 seconds each direction   Perform 1x/day, 2-3 sets of 10-15 reps

## 2017-04-19 NOTE — Therapy (Signed)
[place], [place], [address] Phone: [phone]   Fax:  [phone]  Physical Therapy Treatment  Patient Details  Name: [name] MRN: [id number] Date of Birth: 04-18-1940 Referring Provider: Rod Can, MD   Encounter Date: 04/19/2017  PT End of Session - 04/19/17 1258    Visit Number  15    Number of Visits  19    Date for PT Re-Evaluation  04/29/17    Authorization Type  [employer] Medicare    Authorization Time Period  03/18/17 to 04/29/17    PT Start Time  1300    PT Stop Time  1345    PT Time Calculation (min)  45 min    Activity Tolerance  Patient tolerated treatment well;No increased pain    Behavior During Therapy  WFL for tasks assessed/performed       Past Medical History:  Diagnosis Date  . Arthritis   . Diverticulosis of colon   . Essential hypertension   . History of basal cell carcinoma (BCC) excision    02-10-2013  left cheek  s/p  moh's sx  . History of palpitations    cardiolgoist-  dr Domenic Polite  . History of recurrent UTIs   . History of syncope    2015-- felt to vasovagal response to pain (per cardiologist note)  . IBS (irritable bowel syndrome)   . Open wound of left knee    warm soaks daily /  neosprin and dressing daily / per per still has drainage  . Patellar bursitis of left knee    pre-patella septic bursitis w/ foreign body  . Right thyroid nodule    x2 right side incidental finding on carotid duplex 03/ 2018-- thyroid ultrasound done , benign , follow-up in one year  . Wears hearing aid in both ears     Past Surgical History:  Procedure Laterality Date  . APPENDECTOMY  age 37  . BREAST BIOPSY  1962   benign  . CATARACT EXTRACTION W/PHACO  06/25/2011   Procedure: CATARACT EXTRACTION PHACO AND INTRAOCULAR LENS PLACEMENT (IOC);  Surgeon: Williams Che, MD;  Location: [facility];  Service: Ophthalmology;  Laterality: Right;  CDE: 8.90  . CATARACT EXTRACTION W/PHACO Left  05/05/2012   Procedure: CATARACT EXTRACTION PHACO AND INTRAOCULAR LENS PLACEMENT (IOC);  Surgeon: Williams Che, MD;  Location: [facility];  Service: Ophthalmology;  Laterality: Left;  CDE 12.78  . COLONOSCOPY  last one 11-26-2012  . EXCISION MORTON'S NEUROMA Right 2003  approx.  . INCISION AND DRAINAGE WOUND WITH FOREIGN BODY REMOVAL Left 08/30/2016   Procedure: LEFT KNEE INCISION AND DRAINAGE WOUND WITH FOREIGN BODY REMOVAL;  Surgeon: Sydnee Cabal, MD;  Location: [facility];  Service: Orthopedics;  Laterality: Left;  . KNEE ARTHROPLASTY Right 03/14/2017   Procedure: RIGHT TOTAL KNEE ARTHROPLASTY WITH COMPUTER NAVIGATION AND REMOVAL OF HARDWARE;  Surgeon: Rod Can, MD;  Location: [facility];  Service: Orthopedics;  Laterality: Right;  Adductor Block  . KNEE ARTHROSCOPY W/ ACL RECONSTRUCTION Right 1996  . KNEE CARTILAGE SURGERY Right age 38  . MOHS SURGERY  02/10/2013   left cheek -- BCC  . POSTERIOR REPAIR  05-21-2002   dr Glo Herring   symptomatic recetocele  . TONSILLECTOMY AND ADENOIDECTOMY  age 89  . TRANSTHORACIC ECHOCARDIOGRAM  04-17-2016  dr Domenic Polite   ef 60-65%/  trivial MR/ mild TR  . VAGINAL HYSTERECTOMY  1979   w/  Bilateral Salpingoophorectomy    There were  no vitals filed for this visit.  Subjective Assessment - 04/19/17 1259    Subjective  Pt denies any pain currently. She states that her husbands massages are helping she feels like.     Patient Stated Goals  to be the best that I can; get back to as close to where I was prior    Currently in Pain?  No/denies            [facility] Adult PT Treatment/Exercise - 04/19/17 0001      Knee/Hip Exercises: Aerobic   Stationary Bike  x 3 mins at beginning of session for mobility, seat 6; fwd revolutions      Knee/Hip Exercises: Machines for Strengthening   Cybex Knee Extension  1 plate, RLE only, x10 reps    Cybex Knee Flexion  2 plates, RLE only, 2x10      Knee/Hip Exercises: Standing   Lateral Step Up   Right;20 reps;Step Height: 6"    Step Down  Right;15 reps;Step Height: 6" 3" eccentric lower    Functional Squat  10 reps    Functional Squat Limitations  good mechanics    SLS with Vectors  x5RT BLE on foam each    Gait Training  x1RT fwd tandem gait firm, x1RT fwd tandedm gait foam    Other Standing Knee Exercises  sidestepping 23f x3RT with GTB      Knee/Hip Exercises: Supine   Knee Extension Limitations  4    Knee Flexion Limitations  115      Manual Therapy   Manual Therapy  Soft tissue mobilization;Joint mobilization    Manual therapy comments  completed separate rest of treatment    Joint Mobilization  patellar mobs all directions    Soft tissue mobilization  cross friction and STM to R distal quad (especially VMO)            PT Education - 04/19/17 1259    Education provided  Yes    Education Details  exercise technique; updated HEP    Person(s) Educated  Patient    Methods  Explanation;Demonstration;Handout    Comprehension  Verbalized understanding;Returned demonstration       PT Short Term Goals - 04/08/17 1304      PT SHORT TERM GOAL #1   Title  Pt will be independent with HEP and perform consistently in order to maximize retrun to PLOF.    Time  3    Period  Weeks    Status  Achieved      PT SHORT TERM GOAL #2   Title  Pt will have improved knee AROM from 8-110deg to maximize gait and decrease pain.    Baseline  3/4: 8-110deg    Time  3    Period  Weeks    Status  Achieved      PT SHORT TERM GOAL #3   Title  Pt will have decreased R knee joint line edema by 2" or > to decrease pain, maximize ROM, and maximize gait.    Baseline  3/4: down 1" to 13.25" at joint line    Time  3    Period  Weeks    Status  Partially Met        PT Long Term Goals - 04/08/17 1304      PT LONG TERM GOAL #1   Title  Pt will have imrpoved ROM to 5-120deg or better to maximize sitting tolerance and stair ambulation.    Baseline  3/4: 8-110deg  Time  6    Period   Weeks    Status  On-going      PT LONG TERM GOAL #2   Title  Pt will have at least 4+/5 or > MMT of all muscle groups tested in order to maximize gait, functional strength, and promote return to PLOF.    Baseline  3/4: see MMT    Time  6    Period  Weeks    Status  Partially Met      PT LONG TERM GOAL #3   Title  Pt will be able to perform the TUG in 10 sec or < with LRAD to demo improved functional strength and gait.    Baseline  3/4: 7.6sec no AD    Time  6    Period  Weeks    Status  Achieved      PT LONG TERM GOAL #4   Title  Pt will have improved 3MWT by 128f or > with LRAD and min to no gait deviations to demo improved overall functional strength, gait, and community ambulation.    Baseline  3/4: 7367f no AD, min gait deviations    Time  6    Period  Weeks    Status  Achieved      PT LONG TERM GOAL #5   Title  Pt will report being able to participate in her regular exercise routine and hobbies/outdoor activities without R knee pain to demo pt's return to PLOF and to maximize overall health.    Baseline  3/4: hasn't tried any of her PLOF exercises or outdoor hobbies yet    Time  6    Period  Weeks    Status  On-going            Plan - 04/19/17 1347    Clinical Impression Statement  Continued with knee, hip, and functional strengthening this date. Introduced pt to tandem gait, wall slides, and cybex machines. Pt challenged with tandem gait on foam and single leg strengthening on cybex machines. No pain reported, just challenging and muscle fatigue reported. Pt with palpable muscle restrictions in distal quad, especially VMO. AROM 4-115deg this date. Continue as planned and updated #s next visit and route to MD as pt has f/u appointment on Wednesday 3/20.    Rehab Potential  Excellent    PT Frequency  3x / week    PT Duration  6 weeks    PT Treatment/Interventions  ADLs/Self Care Home Management;Cryotherapy;Electrical Stimulation;Moist Heat;Ultrasound;DME  Instruction;Gait training;Stair training;Functional mobility training;Therapeutic activities;Therapeutic exercise;Balance training;Neuromuscular re-education;Patient/family education;Orthotic Fit/Training;Manual techniques;Scar mobilization;Passive range of motion;Dry needling;Energy conservation;Taping    PT Next Visit Plan  Follow up about compression garment. Continue with manual technqiues to address soft tissue, scar tissue; continue cybex machines; progress gluteal strenghtening and functional strengthening, balance.      PT Home Exercise Plan  eval: quad sets, heel slides, SAQ; 2/20: knee drives on step; 2/5/59prone quad stretch, standing TKE, seated knee flexion stretch, heel and toe raises; 3/4: bridges, stand abd with RTB; 3/15: mini squats, sidestepping with GTB, wall slides, SLS +vectors    Consulted and Agree with Plan of Care  Patient       Patient will benefit from skilled therapeutic intervention in order to improve the following deficits and impairments:  Abnormal gait, Decreased activity tolerance, Decreased mobility, Decreased range of motion, Decreased strength, Difficulty walking, Hypomobility, Increased edema, Increased muscle spasms, Impaired flexibility, Pain  Visit Diagnosis: Stiffness of right knee, not  elsewhere classified  Muscle weakness (generalized)  Localized edema  Difficulty in walking, not elsewhere classified     Problem List Patient Active Problem List   Diagnosis Date Noted  . Post-traumatic osteoarthritis of right knee 03/14/2017  . S/P knee surgery 08/30/2016  . History of recurrent UTIs 09/19/2015  . Osteopenia 05/21/2015  . Osteoarthritis of right hip 05/21/2015  . Arrhythmia, long term 02/15/2014  . Esophageal reflux 02/15/2014  . Chronic back pain 06/01/2013  . Change in bowel habits 11/21/2012  . Diarrhea 11/21/2012      Geraldine Solar PT, DPT  [facility] [address] [place], [place],  [address] Phone: [phone]   Fax:  [phone]  Name: [name] MRN: [id number] Date of Birth: [birth year]

## 2017-04-22 ENCOUNTER — Ambulatory Visit (HOSPITAL_COMMUNITY): Payer: Medicare Other

## 2017-04-22 ENCOUNTER — Encounter (HOSPITAL_COMMUNITY): Payer: Self-pay

## 2017-04-22 DIAGNOSIS — R262 Difficulty in walking, not elsewhere classified: Secondary | ICD-10-CM

## 2017-04-22 DIAGNOSIS — M25661 Stiffness of right knee, not elsewhere classified: Secondary | ICD-10-CM

## 2017-04-22 DIAGNOSIS — M6281 Muscle weakness (generalized): Secondary | ICD-10-CM

## 2017-04-22 DIAGNOSIS — R600 Localized edema: Secondary | ICD-10-CM

## 2017-04-22 NOTE — Therapy (Signed)
[place], [place], [address] Phone: [phone]   Fax:  [phone]  Physical Therapy Treatment  Patient Details  Name: [name] MRN: [id number] Date of Birth: 07/01/1940 Referring Provider: Rod Can, MD   Encounter Date: 04/22/2017  PT End of Session - 04/22/17 1259    Visit Number  16    Number of Visits  19    Date for PT Re-Evaluation  04/29/17    Authorization Type  [employer] Medicare    Authorization Time Period  03/18/17 to 04/29/17    PT Start Time  1300    PT Stop Time  1342    PT Time Calculation (min)  42 min    Activity Tolerance  Patient tolerated treatment well;No increased pain    Behavior During Therapy  WFL for tasks assessed/performed       Past Medical History:  Diagnosis Date  . Arthritis   . Diverticulosis of colon   . Essential hypertension   . History of basal cell carcinoma (BCC) excision    02-10-2013  left cheek  s/p  moh's sx  . History of palpitations    cardiolgoist-  dr Domenic Polite  . History of recurrent UTIs   . History of syncope    2015-- felt to vasovagal response to pain (per cardiologist note)  . IBS (irritable bowel syndrome)   . Open wound of left knee    warm soaks daily /  neosprin and dressing daily / per per still has drainage  . Patellar bursitis of left knee    pre-patella septic bursitis w/ foreign body  . Right thyroid nodule    x2 right side incidental finding on carotid duplex 03/ 2018-- thyroid ultrasound done , benign , follow-up in one year  . Wears hearing aid in both ears     Past Surgical History:  Procedure Laterality Date  . APPENDECTOMY  age 44  . BREAST BIOPSY  1962   benign  . CATARACT EXTRACTION W/PHACO  06/25/2011   Procedure: CATARACT EXTRACTION PHACO AND INTRAOCULAR LENS PLACEMENT (IOC);  Surgeon: Williams Che, MD;  Location: [facility];  Service: Ophthalmology;  Laterality: Right;  CDE: 8.90  . CATARACT EXTRACTION W/PHACO Left  05/05/2012   Procedure: CATARACT EXTRACTION PHACO AND INTRAOCULAR LENS PLACEMENT (IOC);  Surgeon: Williams Che, MD;  Location: [facility];  Service: Ophthalmology;  Laterality: Left;  CDE 12.78  . COLONOSCOPY  last one 11-26-2012  . EXCISION MORTON'S NEUROMA Right 2003  approx.  . INCISION AND DRAINAGE WOUND WITH FOREIGN BODY REMOVAL Left 08/30/2016   Procedure: LEFT KNEE INCISION AND DRAINAGE WOUND WITH FOREIGN BODY REMOVAL;  Surgeon: Sydnee Cabal, MD;  Location: [facility];  Service: Orthopedics;  Laterality: Left;  . KNEE ARTHROPLASTY Right 03/14/2017   Procedure: RIGHT TOTAL KNEE ARTHROPLASTY WITH COMPUTER NAVIGATION AND REMOVAL OF HARDWARE;  Surgeon: Rod Can, MD;  Location: [facility];  Service: Orthopedics;  Laterality: Right;  Adductor Block  . KNEE ARTHROSCOPY W/ ACL RECONSTRUCTION Right 1996  . KNEE CARTILAGE SURGERY Right age 24  . MOHS SURGERY  02/10/2013   left cheek -- BCC  . POSTERIOR REPAIR  05-21-2002   dr Glo Herring   symptomatic recetocele  . TONSILLECTOMY AND ADENOIDECTOMY  age 9  . TRANSTHORACIC ECHOCARDIOGRAM  04-17-2016  dr Domenic Polite   ef 60-65%/  trivial MR/ mild TR  . VAGINAL HYSTERECTOMY  1979   w/  Bilateral Salpingoophorectomy    There were  no vitals filed for this visit.  Subjective Assessment - 04/22/17 1300    Subjective  Pt reports that she had a good weekend. No pain in the knee unless she pushes it hard. She wasn't too sore following last treatment session.    Patient Stated Goals  to be the best that I can; get back to as close to where I was prior    Currently in Pain?  No/denies            [facility] PT Assessment - 04/22/17 0001      Assessment   Medical Diagnosis  R TKR    Referring Provider  Rod Can, MD    Onset Date/Surgical Date  03/14/17    Next MD Visit  04/24/17    Prior Therapy  none for TKR      Circumferential Edema   Circumferential - Right  13" joint line          [facility] Adult PT Treatment/Exercise -  04/22/17 0001      Knee/Hip Exercises: Aerobic   Stationary Bike  x 3 mins at beginning of session for mobility, seat 6; fwd revolutions      Knee/Hip Exercises: Machines for Strengthening   Cybex Knee Extension  1 plate, RLE only, x10 reps    Cybex Knee Flexion  2 plates, RLE only, x15      Knee/Hip Exercises: Standing   Lateral Step Up  Right;15 reps;Step Height: 8"    Forward Step Up  Right;15 reps;Step Height: 8";Limitations    Forward Step Up Limitations  R step ups with knee drives on 8" step G28 reps    Functional Squat  2 sets;10 reps    Functional Squat Limitations  on BOSU dome down    SLS  RLE on foam +palov press with RTB 2x15 reps    Other Standing Knee Exercises  squats to 18" step 3x10reps (last 2 sets with bil 5# DB)      Knee/Hip Exercises: Seated   Stool Scoot - Round Trips  fwd/retro 28f x2RT      Knee/Hip Exercises: Supine   Knee Extension Limitations  5    Knee Flexion Limitations  120      Manual Therapy   Manual Therapy  Soft tissue mobilization;Joint mobilization    Manual therapy comments  completed separate rest of treatment    Joint Mobilization  patellar mobs all directions    Soft tissue mobilization  cross friction and STM to R distal quad (especially VMO)            PT Education - 04/22/17 1259    Education provided  Yes    Education Details  updated #s and sent note to MD; exercise technique    Person(s) Educated  Patient    Methods  Explanation;Demonstration    Comprehension  Verbalized understanding;Returned demonstration       PT Short Term Goals - 04/08/17 1304      PT SHORT TERM GOAL #1   Title  Pt will be independent with HEP and perform consistently in order to maximize retrun to PLOF.    Time  3    Period  Weeks    Status  Achieved      PT SHORT TERM GOAL #2   Title  Pt will have improved knee AROM from 8-110deg to maximize gait and decrease pain.    Baseline  3/4: 8-110deg    Time  3    Period  Weeks  Status   Achieved      PT SHORT TERM GOAL #3   Title  Pt will have decreased R knee joint line edema by 2" or > to decrease pain, maximize ROM, and maximize gait.    Baseline  3/4: down 1" to 13.25" at joint line    Time  3    Period  Weeks    Status  Partially Met        PT Long Term Goals - 04/08/17 1304      PT LONG TERM GOAL #1   Title  Pt will have imrpoved ROM to 5-120deg or better to maximize sitting tolerance and stair ambulation.    Baseline  3/4: 8-110deg    Time  6    Period  Weeks    Status  On-going      PT LONG TERM GOAL #2   Title  Pt will have at least 4+/5 or > MMT of all muscle groups tested in order to maximize gait, functional strength, and promote return to PLOF.    Baseline  3/4: see MMT    Time  6    Period  Weeks    Status  Partially Met      PT LONG TERM GOAL #3   Title  Pt will be able to perform the TUG in 10 sec or < with LRAD to demo improved functional strength and gait.    Baseline  3/4: 7.6sec no AD    Time  6    Period  Weeks    Status  Achieved      PT LONG TERM GOAL #4   Title  Pt will have improved 3MWT by 129f or > with LRAD and min to no gait deviations to demo improved overall functional strength, gait, and community ambulation.    Baseline  3/4: 7333f no AD, min gait deviations    Time  6    Period  Weeks    Status  Achieved      PT LONG TERM GOAL #5   Title  Pt will report being able to participate in her regular exercise routine and hobbies/outdoor activities without R knee pain to demo pt's return to PLOF and to maximize overall health.    Baseline  3/4: hasn't tried any of her PLOF exercises or outdoor hobbies yet    Time  6    Period  Weeks    Status  On-going            Plan - 04/22/17 1344    Clinical Impression Statement  Continued with established POC, progressing pt's functional strength this date. Increased pt's step height, added squats on BOSU, and SLS +palov press with RTB on foam. Pt able to perform all exercises  without c/o pain, just muscle fatigue. Pt continues with R quad weakness AEB difficulty with single leg extensions on cybex machine. Continued soft tissue restrictions of distal VMO but improved from last session. AROM 5-120deg this date. Continue as planned; will likely d/c Friday as she is due for formal reassessment then. [facility].    Rehab Potential  Excellent    PT Frequency  3x / week    PT Duration  6 weeks    PT Treatment/Interventions  ADLs/Self Care Home Management;Cryotherapy;Electrical Stimulation;Moist Heat;Ultrasound;DME Instruction;Gait training;Stair training;Functional mobility training;Therapeutic activities;Therapeutic exercise;Balance training;Neuromuscular re-education;Patient/family education;Orthotic Fit/Training;Manual techniques;Scar mobilization;Passive range of motion;Dry needling;Energy conservation;Taping    PT Next Visit Plan  Continue with manual technqiues to address soft tissue, scar tissue; continue cybex machines; progress gluteal strenghtening and functional strengthening, balance. cotninue weighted squats    PT Home Exercise Plan  eval: quad sets, heel slides, SAQ; 2/20: knee drives on step; 2/50: prone quad stretch, standing TKE, seated knee flexion stretch, heel and toe raises; 3/4: bridges, stand abd with RTB; 3/15: mini squats, sidestepping with GTB, wall slides, SLS +vectors    Consulted and Agree with Plan of Care  Patient       Patient will benefit from skilled therapeutic intervention in order to improve the following deficits and impairments:  Abnormal gait, Decreased activity tolerance, Decreased mobility, Decreased range of motion, Decreased strength, Difficulty walking, Hypomobility, Increased edema, Increased muscle spasms, Impaired flexibility, Pain  Visit Diagnosis: Stiffness of right knee, not elsewhere classified  Muscle weakness (generalized)  Localized edema  Difficulty in walking, not  elsewhere classified     Problem List Patient Active Problem List   Diagnosis Date Noted  . Post-traumatic osteoarthritis of right knee 03/14/2017  . S/P knee surgery 08/30/2016  . History of recurrent UTIs 09/19/2015  . Osteopenia 05/21/2015  . Osteoarthritis of right hip 05/21/2015  . Arrhythmia, long term 02/15/2014  . Esophageal reflux 02/15/2014  . Chronic back pain 06/01/2013  . Change in bowel habits 11/21/2012  . Diarrhea 11/21/2012       Geraldine Solar PT, DPT  [facility] [address] [place], [place], [address] Phone: [phone]   Fax:  [phone]  Name: KALEEYAH CUFFIE MRN: [id number] Date of Birth: [birth year]

## 2017-04-24 ENCOUNTER — Encounter (HOSPITAL_COMMUNITY): Payer: Self-pay

## 2017-04-24 ENCOUNTER — Ambulatory Visit (HOSPITAL_COMMUNITY): Payer: Medicare Other

## 2017-04-24 DIAGNOSIS — R600 Localized edema: Secondary | ICD-10-CM

## 2017-04-24 DIAGNOSIS — M6281 Muscle weakness (generalized): Secondary | ICD-10-CM | POA: Diagnosis not present

## 2017-04-24 DIAGNOSIS — M25661 Stiffness of right knee, not elsewhere classified: Secondary | ICD-10-CM | POA: Diagnosis not present

## 2017-04-24 DIAGNOSIS — Z96651 Presence of right artificial knee joint: Secondary | ICD-10-CM | POA: Diagnosis not present

## 2017-04-24 DIAGNOSIS — R262 Difficulty in walking, not elsewhere classified: Secondary | ICD-10-CM | POA: Diagnosis not present

## 2017-04-24 DIAGNOSIS — Z471 Aftercare following joint replacement surgery: Secondary | ICD-10-CM | POA: Diagnosis not present

## 2017-04-24 NOTE — Therapy (Signed)
[facility] [place], [place], [address] Phone: [phone]   Fax:  [phone]  Physical Therapy Treatment  Patient Details  Name: [name] MRN: [id number] Date of Birth: 1940/09/24 Referring Provider: Rod Can, MD   Encounter Date: 04/24/2017  PT End of Session - 04/24/17 1403    Visit Number  17    Number of Visits  19    Date for PT Re-Evaluation  04/29/17    Authorization Type  [employer] Medicare    Authorization Time Period  03/18/17 to 04/29/17    PT Start Time  1346    PT Stop Time  1430    PT Time Calculation (min)  44 min    Activity Tolerance  Patient tolerated treatment well;No increased pain    Behavior During Therapy  WFL for tasks assessed/performed       Past Medical History:  Diagnosis Date  . Arthritis   . Diverticulosis of colon   . Essential hypertension   . History of basal cell carcinoma (BCC) excision    02-10-2013  left cheek  s/p  moh's sx  . History of palpitations    cardiolgoist-  dr Domenic Polite  . History of recurrent UTIs   . History of syncope    2015-- felt to vasovagal response to pain (per cardiologist note)  . IBS (irritable bowel syndrome)   . Open wound of left knee    warm soaks daily /  neosprin and dressing daily / per per still has drainage  . Patellar bursitis of left knee    pre-patella septic bursitis w/ foreign body  . Right thyroid nodule    x2 right side incidental finding on carotid duplex 03/ 2018-- thyroid ultrasound done , benign , follow-up in one year  . Wears hearing aid in both ears     Past Surgical History:  Procedure Laterality Date  . APPENDECTOMY  age 25  . BREAST BIOPSY  1962   benign  . CATARACT EXTRACTION W/PHACO  06/25/2011   Procedure: CATARACT EXTRACTION PHACO AND INTRAOCULAR LENS PLACEMENT (IOC);  Surgeon: Williams Che, MD;  Location: [facility];  Service: Ophthalmology;  Laterality: Right;  CDE: 8.90  . CATARACT EXTRACTION W/PHACO Left  05/05/2012   Procedure: CATARACT EXTRACTION PHACO AND INTRAOCULAR LENS PLACEMENT (IOC);  Surgeon: Williams Che, MD;  Location: [facility];  Service: Ophthalmology;  Laterality: Left;  CDE 12.78  . COLONOSCOPY  last one 11-26-2012  . EXCISION MORTON'S NEUROMA Right 2003  approx.  . INCISION AND DRAINAGE WOUND WITH FOREIGN BODY REMOVAL Left 08/30/2016   Procedure: LEFT KNEE INCISION AND DRAINAGE WOUND WITH FOREIGN BODY REMOVAL;  Surgeon: Sydnee Cabal, MD;  Location: [facility];  Service: Orthopedics;  Laterality: Left;  . KNEE ARTHROPLASTY Right 03/14/2017   Procedure: RIGHT TOTAL KNEE ARTHROPLASTY WITH COMPUTER NAVIGATION AND REMOVAL OF HARDWARE;  Surgeon: Rod Can, MD;  Location: [facility];  Service: Orthopedics;  Laterality: Right;  Adductor Block  . KNEE ARTHROSCOPY W/ ACL RECONSTRUCTION Right 1996  . KNEE CARTILAGE SURGERY Right age 81  . MOHS SURGERY  02/10/2013   left cheek -- BCC  . POSTERIOR REPAIR  05-21-2002   dr Glo Herring   symptomatic recetocele  . TONSILLECTOMY AND ADENOIDECTOMY  age 60  . TRANSTHORACIC ECHOCARDIOGRAM  04-17-2016  dr Domenic Polite   ef 60-65%/  trivial MR/ mild TR  . VAGINAL HYSTERECTOMY  1979   w/  Bilateral Salpingoophorectomy    There were  no vitals filed for this visit.  Subjective Assessment - 04/24/17 1349    Subjective  Pt stated she began driving today, reports MD happy with progress.  No reports of pain unless bends knee all the way    Patient Stated Goals  to be the best that I can; get back to as close to where I was prior    Currently in Pain?  No/denies                      [facility] Adult PT Treatment/Exercise - 04/24/17 0001      Knee/Hip Exercises: Machines for Strengthening   Cybex Knee Extension  1 plate, RLE only, x10 reps    Cybex Knee Flexion  2 plates, RLE only, 2x10      Knee/Hip Exercises: Standing   Terminal Knee Extension Limitations  15x 10" BTB    Lateral Step Up  Right;15 reps;Step Height: 8"     Forward Step Up  Right;15 reps;Step Height: 8";Limitations    Step Down  Right;15 reps;Step Height: 6"    Functional Squat  2 sets;15 reps    Functional Squat Limitations  squats to 18" 2x 15 with bil 5# DB    SLS  RLE on foam +palov press with RTB 2x15 reps    Other Standing Knee Exercises  --    Other Standing Knee Exercises  sidestepping 78f x3RT with GTB      Knee/Hip Exercises: Supine   Knee Extension  AROM    Knee Extension Limitations  5    Knee Flexion  AROM    Knee Flexion Limitations  120      Manual Therapy   Manual Therapy  Soft tissue mobilization;Joint mobilization    Manual therapy comments  completed separate rest of treatment    Joint Mobilization  patellar mobs all directions    Soft tissue mobilization  cross friction and STM to R distal quad (especially VMO)    Myofascial Release  scar tissue massage to reduce adhesions               PT Short Term Goals - 04/08/17 1304      PT SHORT TERM GOAL #1   Title  Pt will be independent with HEP and perform consistently in order to maximize retrun to PLOF.    Time  3    Period  Weeks    Status  Achieved      PT SHORT TERM GOAL #2   Title  Pt will have improved knee AROM from 8-110deg to maximize gait and decrease pain.    Baseline  3/4: 8-110deg    Time  3    Period  Weeks    Status  Achieved      PT SHORT TERM GOAL #3   Title  Pt will have decreased R knee joint line edema by 2" or > to decrease pain, maximize ROM, and maximize gait.    Baseline  3/4: down 1" to 13.25" at joint line    Time  3    Period  Weeks    Status  Partially Met        PT Long Term Goals - 04/08/17 1304      PT LONG TERM GOAL #1   Title  Pt will have imrpoved ROM to 5-120deg or better to maximize sitting tolerance and stair ambulation.    Baseline  3/4: 8-110deg    Time  6    Period  Weeks    Status  On-going      PT LONG TERM GOAL #2   Title  Pt will have at least 4+/5 or > MMT of all muscle groups tested in order  to maximize gait, functional strength, and promote return to PLOF.    Baseline  3/4: see MMT    Time  6    Period  Weeks    Status  Partially Met      PT LONG TERM GOAL #3   Title  Pt will be able to perform the TUG in 10 sec or < with LRAD to demo improved functional strength and gait.    Baseline  3/4: 7.6sec no AD    Time  6    Period  Weeks    Status  Achieved      PT LONG TERM GOAL #4   Title  Pt will have improved 3MWT by 175f or > with LRAD and min to no gait deviations to demo improved overall functional strength, gait, and community ambulation.    Baseline  3/4: 7368f no AD, min gait deviations    Time  6    Period  Weeks    Status  Achieved      PT LONG TERM GOAL #5   Title  Pt will report being able to participate in her regular exercise routine and hobbies/outdoor activities without R knee pain to demo pt's return to PLOF and to maximize overall health.    Baseline  3/4: hasn't tried any of her PLOF exercises or outdoor hobbies yet    Time  6    Period  Weeks    Status  On-going            Plan - 04/24/17 1950    Clinical Impression Statement  Continued with session focus on functional strengthening and manual techniques for pain control.  Pt all to complete all therex without c/o pain.  Continues to demonstrate weakness with quad eccentric control activities.  EOS pt limited by fatigue.  Manual technqiues complete to reduce soft tissue restriciotn of distal VMO and joint mobs for mobility.      Rehab Potential  Excellent    PT Frequency  3x / week    PT Duration  6 weeks    PT Treatment/Interventions  ADLs/Self Care Home Management;Cryotherapy;Electrical Stimulation;Moist Heat;Ultrasound;DME Instruction;Gait training;Stair training;Functional mobility training;Therapeutic activities;Therapeutic exercise;Balance training;Neuromuscular re-education;Patient/family education;Orthotic Fit/Training;Manual techniques;Scar mobilization;Passive range of motion;Dry  needling;Energy conservation;Taping    PT Next Visit Plan  Continue with manual technqiues to address soft tissue, scar tissue; continue cybex machines; progress gluteal strenghtening and functional strengthening, balance. cotninue weighted squats    PT Home Exercise Plan  eval: quad sets, heel slides, SAQ; 2/20: knee drives on step; 2/9/37prone quad stretch, standing TKE, seated knee flexion stretch, heel and toe raises; 3/4: bridges, stand abd with RTB; 3/15: mini squats, sidestepping with GTB, wall slides, SLS +vectors       Patient will benefit from skilled therapeutic intervention in order to improve the following deficits and impairments:  Abnormal gait, Decreased activity tolerance, Decreased mobility, Decreased range of motion, Decreased strength, Difficulty walking, Hypomobility, Increased edema, Increased muscle spasms, Impaired flexibility, Pain  Visit Diagnosis: Stiffness of right knee, not elsewhere classified  Muscle weakness (generalized)  Localized edema  Difficulty in walking, not elsewhere classified     Problem List Patient Active Problem List   Diagnosis Date Noted  . Post-traumatic osteoarthritis of right knee 03/14/2017  . S/P  knee surgery 08/30/2016  . History of recurrent UTIs 09/19/2015  . Osteopenia 05/21/2015  . Osteoarthritis of right hip 05/21/2015  . Arrhythmia, long term 02/15/2014  . Esophageal reflux 02/15/2014  . Chronic back pain 06/01/2013  . Change in bowel habits 11/21/2012  . Diarrhea 11/21/2012   Ihor Austin, [place]; [place]  Aldona Lento 04/24/2017, 7:59 PM  [facility] [address] [place], [place], [address] Phone: [phone]   Fax:  [phone]  Name: [name] MRN: [id number] Date of Birth: 06-02-1940

## 2017-04-26 ENCOUNTER — Ambulatory Visit (HOSPITAL_COMMUNITY): Payer: Medicare Other

## 2017-04-26 ENCOUNTER — Encounter (HOSPITAL_COMMUNITY): Payer: Self-pay

## 2017-04-26 DIAGNOSIS — R600 Localized edema: Secondary | ICD-10-CM | POA: Diagnosis not present

## 2017-04-26 DIAGNOSIS — M25661 Stiffness of right knee, not elsewhere classified: Secondary | ICD-10-CM

## 2017-04-26 DIAGNOSIS — R262 Difficulty in walking, not elsewhere classified: Secondary | ICD-10-CM

## 2017-04-26 DIAGNOSIS — M6281 Muscle weakness (generalized): Secondary | ICD-10-CM | POA: Diagnosis not present

## 2017-04-26 NOTE — Therapy (Signed)
[facility] [place], [place], [address] Phone: [phone]   Fax:  [phone]  Physical Therapy Treatment/Discharge Summary  Patient Details  Name: [name] MRN: [id number] Date of Birth: 17-Jan-1941 Referring Provider: Rod Can, MD   Encounter Date: 04/26/2017  PT End of Session - 04/26/17 1435    Visit Number  18    Number of Visits  19    Date for PT Re-Evaluation  04/29/17    Authorization Type  [employer] Medicare    Authorization Time Period  03/18/17 to 04/29/17    PT Start Time  1432    PT Stop Time  1453    PT Time Calculation (min)  21 min    Activity Tolerance  Patient tolerated treatment well;No increased pain    Behavior During Therapy  WFL for tasks assessed/performed       Past Medical History:  Diagnosis Date  . Arthritis   . Diverticulosis of colon   . Essential hypertension   . History of basal cell carcinoma (BCC) excision    02-10-2013  left cheek  s/p  moh's sx  . History of palpitations    cardiolgoist-  dr Domenic Polite  . History of recurrent UTIs   . History of syncope    2015-- felt to vasovagal response to pain (per cardiologist note)  . IBS (irritable bowel syndrome)   . Open wound of left knee    warm soaks daily /  neosprin and dressing daily / per per still has drainage  . Patellar bursitis of left knee    pre-patella septic bursitis w/ foreign body  . Right thyroid nodule    x2 right side incidental finding on carotid duplex 03/ 2018-- thyroid ultrasound done , benign , follow-up in one year  . Wears hearing aid in both ears     Past Surgical History:  Procedure Laterality Date  . APPENDECTOMY  age 37  . BREAST BIOPSY  1962   benign  . CATARACT EXTRACTION W/PHACO  06/25/2011   Procedure: CATARACT EXTRACTION PHACO AND INTRAOCULAR LENS PLACEMENT (IOC);  Surgeon: Williams Che, MD;  Location: [facility];  Service: Ophthalmology;  Laterality: Right;  CDE: 8.90  . CATARACT  EXTRACTION W/PHACO Left 05/05/2012   Procedure: CATARACT EXTRACTION PHACO AND INTRAOCULAR LENS PLACEMENT (IOC);  Surgeon: Williams Che, MD;  Location: [facility];  Service: Ophthalmology;  Laterality: Left;  CDE 12.78  . COLONOSCOPY  last one 11-26-2012  . EXCISION MORTON'S NEUROMA Right 2003  approx.  . INCISION AND DRAINAGE WOUND WITH FOREIGN BODY REMOVAL Left 08/30/2016   Procedure: LEFT KNEE INCISION AND DRAINAGE WOUND WITH FOREIGN BODY REMOVAL;  Surgeon: Sydnee Cabal, MD;  Location: [facility];  Service: Orthopedics;  Laterality: Left;  . KNEE ARTHROPLASTY Right 03/14/2017   Procedure: RIGHT TOTAL KNEE ARTHROPLASTY WITH COMPUTER NAVIGATION AND REMOVAL OF HARDWARE;  Surgeon: Rod Can, MD;  Location: [facility];  Service: Orthopedics;  Laterality: Right;  Adductor Block  . KNEE ARTHROSCOPY W/ ACL RECONSTRUCTION Right 1996  . KNEE CARTILAGE SURGERY Right age 21  . MOHS SURGERY  02/10/2013   left cheek -- BCC  . POSTERIOR REPAIR  05-21-2002   dr Glo Herring   symptomatic recetocele  . TONSILLECTOMY AND ADENOIDECTOMY  age 46  . TRANSTHORACIC ECHOCARDIOGRAM  04-17-2016  dr Domenic Polite   ef 60-65%/  trivial MR/ mild TR  . VAGINAL HYSTERECTOMY  1979   w/  Bilateral Salpingoophorectomy    There  were no vitals filed for this visit.  Subjective Assessment - 04/26/17 1457    Subjective  Pt reports she's doing very well. No reports of pain or other issues today.    Patient Stated Goals  to be the best that I can; get back to as close to where I was prior    Currently in Pain?  No/denies            [facility] PT Assessment - 04/26/17 0001      Assessment   Medical Diagnosis  R TKR    Referring Provider  Rod Can, MD    Onset Date/Surgical Date  03/14/17    Prior Therapy  none for TKR      Observation/Other Assessments   Focus on Therapeutic Outcomes (FOTO)   55% limitation on d/c      Circumferential Edema   Circumferential - Right  12.8" joint line was 13.25" joint  line      AROM   Right Knee Extension  5 was 8    Right Knee Flexion  122 was 110      Strength   Right Hip Flexion  5/5 was 4+    Right Hip Extension  4/5 was 4-    Right Hip ABduction  4+/5 was 4    Left Hip Extension  4+/5 was 4    Left Hip ABduction  4+/5 was 4+    Right Knee Flexion  4+/5 was 4+    Right Knee Extension  5/5    Right Ankle Dorsiflexion  5/5 was 4+    Left Ankle Dorsiflexion  5/5 was 4+            PT Short Term Goals - 04/26/17 1435      PT SHORT TERM GOAL #1   Title  Pt will be independent with HEP and perform consistently in order to maximize retrun to PLOF.    Time  3    Period  Weeks    Status  Achieved      PT SHORT TERM GOAL #2   Title  Pt will have improved knee AROM from 8-110deg to maximize gait and decrease pain.    Baseline  3/22: 5-122deg    Time  3    Period  Weeks    Status  Achieved      PT SHORT TERM GOAL #3   Title  Pt will have decreased R knee joint line edema by 2" or > to decrease pain, maximize ROM, and maximize gait.    Baseline  3/22: 12.8" joint line down from 14.25"    Time  3    Period  Weeks    Status  Partially Met        PT Long Term Goals - 04/26/17 1436      PT LONG TERM GOAL #1   Title  Pt will have imrpoved ROM to 5-120deg or better to maximize sitting tolerance and stair ambulation.    Baseline  3/22: 5-122deg    Time  6    Period  Weeks    Status  Achieved      PT LONG TERM GOAL #2   Title  Pt will have at least 4+/5 or > MMT of all muscle groups tested in order to maximize gait, functional strength, and promote return to PLOF.    Baseline  3/22: all 4+/5 or >, R hip ext 4/5    Time  6    Period  Weeks  Status  Achieved      PT LONG TERM GOAL #3   Title  Pt will be able to perform the TUG in 10 sec or < with LRAD to demo improved functional strength and gait.    Baseline  3/4: 7.6sec no AD    Time  6    Period  Weeks    Status  Achieved      PT LONG TERM GOAL #4   Title  Pt will have  improved 3MWT by 140f or > with LRAD and min to no gait deviations to demo improved overall functional strength, gait, and community ambulation.    Baseline  3/4: 7360f no AD, min gait deviations    Time  6    Period  Weeks    Status  Achieved      PT LONG TERM GOAL #5   Title  Pt will report being able to participate in her regular exercise routine and hobbies/outdoor activities without R knee pain to demo pt's return to PLOF and to maximize overall health.    Baseline  3/22: hasn't tried any of her PLOF exercises or outdoor hobbies yet    Time  6    Period  Weeks    Status  On-going            Plan - 04/26/17 1455    Clinical Impression Statement  PT reassessed pt's goals and outcome measures this date. Pt has made tremendous progress towards goals as illustrated above. Her AROM was 5-122deg today, her swelling has decreased by almost 2" at joint line, and her MMT is at least 4+/5 or greater. Pt is ready for discharge at this time. She was encouraged to steadily return to her normal routines and continue to ice her knee to help with any swelling that may occur from being on her feet more. She verbalized understanding. Pt d/c to current HEP today.    Rehab Potential  Excellent    PT Frequency  3x / week    PT Duration  6 weeks    PT Treatment/Interventions  ADLs/Self Care Home Management;Cryotherapy;Electrical Stimulation;Moist Heat;Ultrasound;DME Instruction;Gait training;Stair training;Functional mobility training;Therapeutic activities;Therapeutic exercise;Balance training;Neuromuscular re-education;Patient/family education;Orthotic Fit/Training;Manual techniques;Scar mobilization;Passive range of motion;Dry needling;Energy conservation;Taping    PT Next Visit Plan  discharged    PT Home Exercise Plan  eval: quad sets, heel slides, SAQ; 2/20: knee drives on step; 2/4/85prone quad stretch, standing TKE, seated knee flexion stretch, heel and toe raises; 3/4: bridges, stand abd with  RTB; 3/15: mini squats, sidestepping with GTB, wall slides, SLS +vectors       Patient will benefit from skilled therapeutic intervention in order to improve the following deficits and impairments:  Abnormal gait, Decreased activity tolerance, Decreased mobility, Decreased range of motion, Decreased strength, Difficulty walking, Hypomobility, Increased edema, Increased muscle spasms, Impaired flexibility, Pain  Visit Diagnosis: Stiffness of right knee, not elsewhere classified  Muscle weakness (generalized)  Localized edema  Difficulty in walking, not elsewhere classified     Problem List Patient Active Problem List   Diagnosis Date Noted  . Post-traumatic osteoarthritis of right knee 03/14/2017  . S/P knee surgery 08/30/2016  . History of recurrent UTIs 09/19/2015  . Osteopenia 05/21/2015  . Osteoarthritis of right hip 05/21/2015  . Arrhythmia, long term 02/15/2014  . Esophageal reflux 02/15/2014  . Chronic back pain 06/01/2013  . Change in bowel habits 11/21/2012  . Diarrhea 11/21/2012    PHYSICAL THERAPY DISCHARGE SUMMARY  Visits from Start of  Care: 18  Current functional level related to goals / functional outcomes: See above   Remaining deficits: See above   Education / Equipment: See above Plan: Patient agrees to discharge.  Patient goals were met. Patient is being discharged due to meeting the stated rehab goals.  ?????       Geraldine Solar PT, [facility] [address] [place], [place], [address] Phone: [phone]   Fax:  [phone]  Name: [name] MRN: [id number] Date of Birth: September 29, 1940

## 2017-04-29 ENCOUNTER — Other Ambulatory Visit: Payer: Self-pay | Admitting: Ophthalmology

## 2017-04-29 ENCOUNTER — Encounter (HOSPITAL_COMMUNITY): Payer: Medicare Other

## 2017-04-29 DIAGNOSIS — H0419 Other specified disorders of lacrimal gland: Secondary | ICD-10-CM | POA: Diagnosis not present

## 2017-04-29 DIAGNOSIS — H04222 Epiphora due to insufficient drainage, left lacrimal gland: Secondary | ICD-10-CM | POA: Diagnosis not present

## 2017-04-29 DIAGNOSIS — H04552 Acquired stenosis of left nasolacrimal duct: Secondary | ICD-10-CM | POA: Diagnosis not present

## 2017-04-29 DIAGNOSIS — H02135 Senile ectropion of left lower eyelid: Secondary | ICD-10-CM | POA: Diagnosis not present

## 2017-05-08 ENCOUNTER — Telehealth: Payer: Self-pay | Admitting: Family Medicine

## 2017-05-08 NOTE — Telephone Encounter (Signed)
1.  I recommend a follow-up thyroid ultrasound #2 I recommend the patient does a follow-up office visit several days after the thyroid ultrasound at that time she can discuss the thyroid nodule follow-up and she can discuss this medication that she is stating about vaginal dryness-it is best for this discussion to be held with her primary care doctor in a face-to-face visit so he can cover all options-I would not recommend being this particular visit to be combined with her 33-month follow-up in May this should go ahead and be done this month

## 2017-05-08 NOTE — Telephone Encounter (Signed)
Pt was wanted to know more information on vaginal lubricant called Imbexxy. She states it is for post menopausal women with dryness and low dose estrogen and she wanted to know if provider recommened this. She wanted to get a written script so she could shop around for this. Her next question was about a thyroid ultrasound that she has last year for a nodule. Wanted to know if she needed to have this done before her visit due to Dr. Richardson Landry stating she needed a repeat in one year. She is aware that Dr. Richardson Landry is out until next week. Thanks.

## 2017-05-08 NOTE — Telephone Encounter (Signed)
Pt is wanting a nurse to call her she has a few questions regarding a medication and also regarding a scan that she is suppose to have. Please advise.

## 2017-05-09 ENCOUNTER — Other Ambulatory Visit: Payer: Self-pay | Admitting: Family Medicine

## 2017-05-09 DIAGNOSIS — E041 Nontoxic single thyroid nodule: Secondary | ICD-10-CM

## 2017-05-09 NOTE — Telephone Encounter (Signed)
Spoke with patient, ultrasound order placed and patient aware to make follow up visit several days after ultrasound. Pt verbalized understanding

## 2017-05-14 ENCOUNTER — Ambulatory Visit (HOSPITAL_COMMUNITY)
Admission: RE | Admit: 2017-05-14 | Discharge: 2017-05-14 | Disposition: A | Discharge status: Home / Self Care | Payer: Medicare Other | Source: Ambulatory Visit | Attending: Family Medicine | Admitting: Family Medicine

## 2017-05-14 ENCOUNTER — Telehealth: Payer: Self-pay | Admitting: *Deleted

## 2017-05-14 DIAGNOSIS — E041 Nontoxic single thyroid nodule: Secondary | ICD-10-CM | POA: Diagnosis not present

## 2017-05-14 DIAGNOSIS — E042 Nontoxic multinodular goiter: Secondary | ICD-10-CM | POA: Diagnosis not present

## 2017-05-14 NOTE — Telephone Encounter (Signed)
Error

## 2017-05-15 ENCOUNTER — Other Ambulatory Visit: Payer: Self-pay | Admitting: Family Medicine

## 2017-05-15 DIAGNOSIS — E041 Nontoxic single thyroid nodule: Secondary | ICD-10-CM

## 2017-05-15 NOTE — Progress Notes (Unsigned)
amb ref °

## 2017-05-16 ENCOUNTER — Ambulatory Visit (HOSPITAL_COMMUNITY): Payer: Medicare Other

## 2017-05-16 DIAGNOSIS — Z961 Presence of intraocular lens: Secondary | ICD-10-CM | POA: Diagnosis not present

## 2017-05-16 DIAGNOSIS — H40013 Open angle with borderline findings, low risk, bilateral: Secondary | ICD-10-CM | POA: Diagnosis not present

## 2017-05-16 DIAGNOSIS — H26491 Other secondary cataract, right eye: Secondary | ICD-10-CM | POA: Diagnosis not present

## 2017-05-20 DIAGNOSIS — J3489 Other specified disorders of nose and nasal sinuses: Secondary | ICD-10-CM | POA: Diagnosis not present

## 2017-05-20 DIAGNOSIS — H02135 Senile ectropion of left lower eyelid: Secondary | ICD-10-CM | POA: Diagnosis not present

## 2017-05-20 DIAGNOSIS — H04552 Acquired stenosis of left nasolacrimal duct: Secondary | ICD-10-CM | POA: Diagnosis not present

## 2017-05-20 DIAGNOSIS — H04221 Epiphora due to insufficient drainage, right lacrimal gland: Secondary | ICD-10-CM | POA: Diagnosis not present

## 2017-05-31 ENCOUNTER — Ambulatory Visit: Payer: Medicare Other | Admitting: Family Medicine

## 2017-05-31 VITALS — Temp 98.2°F | Ht 61.0 in | Wt 117.2 lb

## 2017-05-31 DIAGNOSIS — N390 Urinary tract infection, site not specified: Secondary | ICD-10-CM

## 2017-05-31 DIAGNOSIS — R300 Dysuria: Secondary | ICD-10-CM | POA: Diagnosis not present

## 2017-05-31 LAB — POCT URINALYSIS DIPSTICK
Blood, UA: 1
pH, UA: 8 (ref 5.0–8.0)

## 2017-05-31 MED ORDER — CEFPROZIL 500 MG PO TABS: 500.0000 mg | ORAL_TABLET | Freq: Two times a day (BID) | ORAL | 0 refills | Status: DC

## 2017-05-31 MED ORDER — ESTRADIOL 0.1 MG/GM VA CREA: TOPICAL_CREAM | VAGINAL | 4 refills | Status: DC

## 2017-05-31 NOTE — Progress Notes (Signed)
   Subjective:    Patient ID: [name], female    DOB: 1940/05/07, 77 y.o.   MRN: [id number]  HPI  Patient arrives with dysuria that started 5 hours ago-has tried Cranberry pill. Patient with frequent urinary tract infections She states that she is having some burning and discomfort that started earlier today She states she does get some urinary tract infections that are related to sexual activity She denies any type of high fever chills sweats abdominal pain nausea vomiting diarrhea Does have medication she uses for vaginal dryness Results for orders placed or performed in visit on 05/31/17  POCT urinalysis dipstick  Result Value Ref Range   Color, UA     Clarity, UA     Glucose, UA     Bilirubin, UA     Ketones, UA     Spec Grav, UA <=1.005 (A) 1.010 - 1.025   Blood, UA 1 plus    pH, UA 8.0 5.0 - 8.0   Protein, UA     Urobilinogen, UA  0.2 or 1.0 E.U./dL   Nitrite, UA     Leukocytes, UA Large (3+) (A) Negative   Appearance     Odor      Review of Systems Relates dysuria urinary frequency denies flank pain abdominal pain no nausea vomiting diarrhea no chest tightness congestion no wheezing or fever chills    Objective:   Physical Exam Lungs clear respiratory rate normal heart is regular no murmurs extremities no edema skin warm dry flank nontender abdomen is soft with minimal lower abdominal tenderness       Assessment & Plan:  UTI Cefzil 5 days Culture sent Intravaginal estrogen twice per week Follow-up with Dr. Richardson Landry regular basis Warnings discussed

## 2017-06-04 ENCOUNTER — Encounter: Payer: Self-pay | Admitting: Cardiology

## 2017-06-04 DIAGNOSIS — E041 Nontoxic single thyroid nodule: Secondary | ICD-10-CM | POA: Diagnosis not present

## 2017-06-04 NOTE — Progress Notes (Signed)
Cardiology Office Note  Date: 06/05/2017   ID: [name], DOB 1940/03/03, MRN [id number]  PCP: Mikey Kirschner, MD  Primary Cardiologist: Rozann Lesches, MD   Chief Complaint  Patient presents with  . History of syncope    History of Present Illness: [name] is a 77 y.o. female last seen in March 2018.  She presents for a routine follow-up visit. She has had no episodes of syncope since I last saw her.  No palpitations or chest pain.  I personally reviewed her ECG today which shows normal sinus rhythm.  I reviewed her recent lab work per Dr. Wolfgang Phoenix.  We went over her current medications.  She continues on Lopressor and Cozaar with adequate blood pressure control.  Past Medical History:  Diagnosis Date  . Arthritis   . Diverticulosis of colon   . Essential hypertension   . History of basal cell carcinoma (BCC) excision    02-10-2013  left cheek  s/p  moh's sx  . History of palpitations   . History of recurrent UTIs   . History of syncope    2015-- felt to vasovagal response to pain (per cardiologist note)  . IBS (irritable bowel syndrome)   . Open wound of left knee    warm soaks daily /  neosprin and dressing daily / per per still has drainage  . Patellar bursitis of left knee    pre-patella septic bursitis w/ foreign body  . Right thyroid nodule    x2 right side incidental finding on carotid duplex 03/ 2018-- thyroid ultrasound done , benign , follow-up in one year  . Wears hearing aid in both ears     Past Surgical History:  Procedure Laterality Date  . APPENDECTOMY  age 73  . BREAST BIOPSY  1962   benign  . CATARACT EXTRACTION W/PHACO  06/25/2011   Procedure: CATARACT EXTRACTION PHACO AND INTRAOCULAR LENS PLACEMENT (IOC);  Surgeon: Williams Che, MD;  Location: [facility];  Service: Ophthalmology;  Laterality: Right;  CDE: 8.90  . CATARACT EXTRACTION W/PHACO Left 05/05/2012   Procedure: CATARACT EXTRACTION PHACO AND INTRAOCULAR LENS PLACEMENT  (IOC);  Surgeon: Williams Che, MD;  Location: [facility];  Service: Ophthalmology;  Laterality: Left;  CDE 12.78  . COLONOSCOPY  last one 11-26-2012  . EXCISION MORTON'S NEUROMA Right 2003  approx.  . INCISION AND DRAINAGE WOUND WITH FOREIGN BODY REMOVAL Left 08/30/2016   Procedure: LEFT KNEE INCISION AND DRAINAGE WOUND WITH FOREIGN BODY REMOVAL;  Surgeon: Sydnee Cabal, MD;  Location: [place];  Service: Orthopedics;  Laterality: Left;  . KNEE ARTHROPLASTY Right 03/14/2017   Procedure: RIGHT TOTAL KNEE ARTHROPLASTY WITH COMPUTER NAVIGATION AND REMOVAL OF HARDWARE;  Surgeon: Rod Can, MD;  Location: [facility];  Service: Orthopedics;  Laterality: Right;  Adductor Block  . KNEE ARTHROSCOPY W/ ACL RECONSTRUCTION Right 1996  . KNEE CARTILAGE SURGERY Right age 32  . MOHS SURGERY  02/10/2013   left cheek -- BCC  . POSTERIOR REPAIR  05-21-2002   dr Glo Herring   symptomatic recetocele  . TONSILLECTOMY AND ADENOIDECTOMY  age 89  . TRANSTHORACIC ECHOCARDIOGRAM  04-17-2016  dr Domenic Polite   ef 60-65%/  trivial MR/ mild TR  . VAGINAL HYSTERECTOMY  1979   w/  Bilateral Salpingoophorectomy    Current Outpatient Medications  Medication Sig Dispense Refill  . estradiol (ESTRACE) 0.1 MG/GM vaginal cream 2g twice a week 42.5 g 4  . Flaxseed, Linseed, (FLAX PO) Take 1  Dose by mouth every morning.     Marland Kitchen losartan (COZAAR) 50 MG tablet TAKE 1 TABLET BY MOUTH EVERY MORNING. 90 tablet 1  . metoprolol tartrate (LOPRESSOR) 50 MG tablet TAKE (1) TABLET TWICE DAILY. 180 tablet 0  . Multiple Vitamins-Minerals (CENTRUM SILVER) tablet Take 1 tablet by mouth daily.    . OMEGA 3 1000 MG CAPS Take 1,000 mg by mouth at bedtime.     . Probiotic Product (PROBIOTIC DAILY PO) Take 1 tablet by mouth daily.    . Psyllium (METAMUCIL PO) Take 1 Scoop by mouth every evening.     . sodium chloride (OCEAN) 0.65 % SOLN nasal spray Place 1 spray into both nostrils daily as needed for congestion.     No current  facility-administered medications for this visit.    Allergies:  Codeine; Hydrocodone; and Penicillins   Social History: The patient  reports that she quit smoking about 55 years ago. Her smoking use included cigarettes. She has a 5.00 pack-year smoking history. She has never used smokeless tobacco. She reports that she drinks about 8.4 oz of alcohol per week. She reports that she does not use drugs.   ROS:  Please see the history of present illness. Otherwise, complete review of systems is positive for interval right knee replacement, doing well.  All other systems are reviewed and negative.   Physical Exam: VS:  BP (!) 142/88   Pulse 63   Ht 5\' 1"  (1.549 m)   Wt 113 lb (51.3 kg)   LMP  (LMP Unknown)   BMI 21.35 kg/m , BMI Body mass index is 21.35 kg/m.  Wt Readings from Last 3 Encounters:  06/05/17 113 lb (51.3 kg)  05/31/17 117 lb 3.2 oz (53.2 kg)  03/14/17 118 lb (53.5 kg)    General: Elderly woman, appears comfortable at rest. HEENT: Conjunctiva and lids normal, oropharynx clear. Neck: Supple, no elevated JVP or carotid bruits, no thyromegaly. Lungs: Clear to auscultation, nonlabored breathing at rest. Cardiac: Regular rate and rhythm, no S3 or significant systolic murmur, no pericardial rub. Abdomen: Soft, nontender, bowel sounds present. Extremities: No pitting edema, distal pulses 2+. Skin: Warm and dry. Musculoskeletal: No kyphosis. Neuropsychiatric: Alert and oriented x3, affect grossly appropriate.  ECG: I personally reviewed the tracing from 04/09/2016 which showed sinus rhythm with nonspecific T wave changes.  Recent Labwork: 01/17/2017: ALT 23; AST 21 03/15/2017: BUN 9; Creatinine, Ser 0.70; Potassium 4.0; Sodium 136 03/16/2017: Hemoglobin 8.9; Platelets 176     Component Value Date/Time   CHOL 221 (H) 01/17/2017 0825   TRIG 84 01/17/2017 0825   HDL 104 01/17/2017 0825   CHOLHDL 2.1 01/17/2017 0825   CHOLHDL 2.8 01/22/2013 0803   VLDL 13 01/22/2013 0803    LDLCALC 100 (H) 01/17/2017 0825    Other Studies Reviewed Today:  Echocardiogram 04/17/2016: Study Conclusions  - Left ventricle: The cavity size was normal. Wall thickness was   normal. Systolic function was normal. The estimated ejection   fraction was in the range of 60% to 65%. Wall motion was normal;   there were no regional wall motion abnormalities. Diastolic   dysfunction, grade indeterminate. Normal filling pressures. - Mitral valve: Mildly to moderately calcified annulus. - Tricuspid valve: There was mild regurgitation.  Assessment and Plan:  1.  History of vasovagal syncope, no recurrence over the last year.  Would continue with observation.  ECG today is normal.  2.  History of palpitations without recurrence or documented arrhythmia.  She remains on Lopressor.  Current medicines were reviewed with the patient today.   Orders Placed This Encounter  Procedures  . EKG 12-Lead    Disposition: Follow-up in 1 year.  Signed, Satira Sark, MD, [facility] 06/05/2017 11:23 AM    [facility]. [address], [place], [place] [address] Phone: [phone]; Fax: [phone]

## 2017-06-05 ENCOUNTER — Ambulatory Visit: Payer: Medicare Other | Admitting: Cardiology

## 2017-06-05 ENCOUNTER — Encounter: Payer: Self-pay | Admitting: Cardiology

## 2017-06-05 ENCOUNTER — Encounter

## 2017-06-05 VITALS — BP 142/88 | HR 63 | Ht 61.0 in | Wt 113.0 lb

## 2017-06-05 DIAGNOSIS — R002 Palpitations: Secondary | ICD-10-CM

## 2017-06-05 DIAGNOSIS — Z87898 Personal history of other specified conditions: Secondary | ICD-10-CM

## 2017-06-05 NOTE — Patient Instructions (Signed)

## 2017-06-06 LAB — URINE CULTURE

## 2017-06-07 ENCOUNTER — Other Ambulatory Visit: Payer: Self-pay | Admitting: Otolaryngology

## 2017-06-07 DIAGNOSIS — E041 Nontoxic single thyroid nodule: Secondary | ICD-10-CM

## 2017-06-13 ENCOUNTER — Ambulatory Visit
Admission: RE | Admit: 2017-06-13 | Discharge: 2017-06-13 | Disposition: A | Discharge status: Home / Self Care | Payer: Medicare Other | Source: Ambulatory Visit | Attending: Otolaryngology | Admitting: Otolaryngology

## 2017-06-13 ENCOUNTER — Other Ambulatory Visit (HOSPITAL_COMMUNITY)
Admission: RE | Admit: 2017-06-13 | Discharge: 2017-06-13 | Disposition: A | Discharge status: Home / Self Care | Payer: Medicare Other | Source: Ambulatory Visit | Attending: Interventional Radiology | Admitting: Interventional Radiology

## 2017-06-13 DIAGNOSIS — E041 Nontoxic single thyroid nodule: Secondary | ICD-10-CM

## 2017-06-24 DIAGNOSIS — H04552 Acquired stenosis of left nasolacrimal duct: Secondary | ICD-10-CM | POA: Diagnosis not present

## 2017-06-25 ENCOUNTER — Encounter: Payer: Self-pay | Admitting: Family Medicine

## 2017-06-25 ENCOUNTER — Ambulatory Visit: Payer: Medicare Other | Admitting: Family Medicine

## 2017-06-25 VITALS — BP 130/84 | Ht 61.0 in | Wt 112.2 lb

## 2017-06-25 DIAGNOSIS — R300 Dysuria: Secondary | ICD-10-CM | POA: Diagnosis not present

## 2017-06-25 DIAGNOSIS — E041 Nontoxic single thyroid nodule: Secondary | ICD-10-CM

## 2017-06-25 DIAGNOSIS — I10 Essential (primary) hypertension: Secondary | ICD-10-CM | POA: Diagnosis not present

## 2017-06-25 MED ORDER — LOSARTAN POTASSIUM 50 MG PO TABS: 50.0000 mg | ORAL_TABLET | Freq: Every morning | ORAL | 1 refills | Status: DC

## 2017-06-25 MED ORDER — METOPROLOL TARTRATE 50 MG PO TABS: ORAL_TABLET | ORAL | 1 refills | Status: DC

## 2017-06-25 NOTE — Progress Notes (Signed)
   Subjective:    Patient ID: [name], female    DOB: [birth year], 77 y.o.   MRN: [id number]   Hypertension   This is a chronic problem. The current episode started more than 1 year ago. Risk factors for coronary artery disease include post-menopausal state. Treatments tried: losartan and metoprolol.  There are no compliance problems.    Good result to knee repaement  Walking reg and no prob  Now can do squat, and riding bikes well  Handled the thyr biopsy well,   Handled the biopsy well  Blood pressure medicine and blood pressure levels reviewed today with patient. Compliant with blood pressure medicine. States does not miss a dose. No obvious side effects. Blood pressure generally good when checked elsewhere. Watching salt intake.   pts concerned about recurrent uti's , keeps well hydrated, suses lubrication with sex,  Pt has had three recurences of uti in the past several months. Using cranberry acidification daily,  Imvexxy, with [facility] can get it les pricey  Pt now on ot c probiotic, was taking on called true nature    Hoping the uti may decr symtoms   On card  And b p meds on a reg basis   Due to see car regulaly  +     Review of Systems No headache, no major weight loss or weight gain, no chest pain no back pain abdominal pain no change in bowel habits complete ROS otherwise negative     Objective:   Physical Exam Alert and oriented, vitals reviewed and stable, NAD ENT-TM's and ext canals WNL bilat via otoscopic exam Soft palate, tonsils and post pharynx WNL via oropharyngeal exam Neck-symmetric, no masses; thyroid nonpalpable and nontender Pulmonary-no tachypnea or accessory muscle use; Clear without wheezes via auscultation Card--no abnrml murmurs, rhythm reg and rate WNL Carotid pulses symmetric, without bruits        Assessment & Plan:  1 impression.  Hypertension.  Good control.  Discussed.  To maintain same medications.  2.  Vaginal  estrogen insufficiency with question association with potential increased likelihood of UTIs.  Long discussion held numerous questions answered.  Requests a new brand name estrogen vaginal suppository in hopes that it would do better than her chronic supplement.  Try to honor her wishes.  3.  Frequent UTIs discussion held regarding probiotics.  May or may not benefit.  Can certainly give it a try   Discussed  specialist approach to thyroid nodules which certainly is appropriate.  He will be seeing her yearly.  Patient recently has asked for the cardiologist to start seeing her yearly to in regards to her blood pressure and general concerns   Greater than 50% of this 25 minute face to face visit was spent in counseling and discussion and coordination of care regarding the above diagnosis/diagnosies

## 2017-07-10 ENCOUNTER — Other Ambulatory Visit: Payer: Self-pay | Admitting: Family Medicine

## 2017-07-16 ENCOUNTER — Telehealth: Payer: Self-pay | Admitting: Family Medicine

## 2017-07-16 MED ORDER — CEFPROZIL 500 MG PO TABS: ORAL_TABLET | ORAL | 1 refills | Status: DC

## 2017-07-16 NOTE — Telephone Encounter (Signed)
Pt with burning, pain & frequent urination Started this morning - pt states Dr. Richardson Landry has in the past called in meds to keep it from getting out of control  NTBS or call in meds?  Please advise & call pt

## 2017-07-16 NOTE — Telephone Encounter (Signed)
Left message to return call to get more info.  

## 2017-07-16 NOTE — Telephone Encounter (Signed)
No fever, no abd pain, no back pain, having pressure on bladder, frequency and burning. Started this morning. Pt states every time she has intercourse she gets UTI. Has enough antibiotic cefprozil 500mg  to use for the next 5 days that she got last time she saw dr Nicki Reaper. And she took one this morning.   [name]

## 2017-07-16 NOTE — Telephone Encounter (Signed)
Patient is aware of all and medications sent to [facility].

## 2017-07-16 NOTE — Telephone Encounter (Signed)
One p o bid for five days is considered appropriate duration these days ( in fact often experts rec just three days for straight forward uti's)  Refill cefzil 500 numb 20 take one p o after sex one ref

## 2017-07-26 ENCOUNTER — Telehealth: Payer: Self-pay | Admitting: Family Medicine

## 2017-07-26 NOTE — Telephone Encounter (Signed)
Patient would like [place] to get a prior approval for the new brand name estrogen(imvexxy) that she requested at her last visit.

## 2017-07-26 NOTE — Telephone Encounter (Signed)
I prescribed this because pt insisted on it; I had never heard of it. Go ahead and see if will qualify

## 2017-07-26 NOTE — Telephone Encounter (Signed)
Patient said that Dr. Richardson Landry prescribed her Imvexxy a couple of weeks ago and she said it was too expensive.  She called her insurance and found that they will cover the medication, but will need prior authorization.  She is requesting a nurse to call her and discuss this.

## 2017-07-30 ENCOUNTER — Other Ambulatory Visit: Payer: Self-pay | Admitting: *Deleted

## 2017-07-30 DIAGNOSIS — D229 Melanocytic nevi, unspecified: Secondary | ICD-10-CM | POA: Diagnosis not present

## 2017-07-30 DIAGNOSIS — L821 Other seborrheic keratosis: Secondary | ICD-10-CM | POA: Diagnosis not present

## 2017-07-30 MED ORDER — ESTRADIOL 4 MCG VA INST: 4.0000 ug | VAGINAL_INSERT | Freq: Every day | VAGINAL | 4 refills | Status: DC | PRN

## 2017-07-30 MED ORDER — ESTRADIOL 4 MCG VA INST: 4.0000 ug | VAGINAL_INSERT | Freq: Every day | VAGINAL | 0 refills | Status: DC | PRN

## 2017-07-30 NOTE — Telephone Encounter (Signed)
Fax from [employer] imvexxy approved thorugh 02/04/18. Pt notified and script sent to [facility] per pt request. Letter of approval sent to medical records to be scanned into chart.

## 2017-07-30 NOTE — Telephone Encounter (Signed)
PA submitted 07/30/17-await response

## 2017-09-04 ENCOUNTER — Other Ambulatory Visit: Payer: Self-pay | Admitting: Dermatology

## 2017-09-04 DIAGNOSIS — D229 Melanocytic nevi, unspecified: Secondary | ICD-10-CM | POA: Diagnosis not present

## 2017-09-04 DIAGNOSIS — D485 Neoplasm of uncertain behavior of skin: Secondary | ICD-10-CM | POA: Diagnosis not present

## 2017-09-04 DIAGNOSIS — L570 Actinic keratosis: Secondary | ICD-10-CM | POA: Diagnosis not present

## 2017-09-04 DIAGNOSIS — L821 Other seborrheic keratosis: Secondary | ICD-10-CM | POA: Diagnosis not present

## 2017-09-12 ENCOUNTER — Encounter: Payer: Self-pay | Admitting: Family Medicine

## 2017-09-12 ENCOUNTER — Ambulatory Visit: Payer: Medicare Other | Admitting: Family Medicine

## 2017-09-12 VITALS — BP 134/86 | Ht 61.0 in | Wt 111.8 lb

## 2017-09-12 DIAGNOSIS — W57XXXA Bitten or stung by nonvenomous insect and other nonvenomous arthropods, initial encounter: Secondary | ICD-10-CM | POA: Diagnosis not present

## 2017-09-12 DIAGNOSIS — R5383 Other fatigue: Secondary | ICD-10-CM | POA: Diagnosis not present

## 2017-09-12 DIAGNOSIS — N390 Urinary tract infection, site not specified: Secondary | ICD-10-CM

## 2017-09-12 MED ORDER — ESTRADIOL 4 MCG VA INST: 4.0000 ug | VAGINAL_INSERT | Freq: Every day | VAGINAL | 11 refills | Status: DC | PRN

## 2017-09-12 MED ORDER — CEFPROZIL 500 MG PO TABS: ORAL_TABLET | ORAL | 1 refills | Status: DC

## 2017-09-12 NOTE — Progress Notes (Signed)
   Subjective:    Patient ID: [name], female    DOB: 09-19-1940, 77 y.o.   MRN: [id number] Patient arrives with numerous concerns HPI Pt here today to follow up on meds. Pt would like to review how the  Estradiol.  Pt states she would also like testing Lymes disease.   Utilizing cefzil post having sex  Has not had a uti    Using a the nex intravag estradiol  And a "special "  She has had 3 tick bites this summer and having muscle aches and fatigued.  The estrogen supplement costs nearly 400 per wk, pts sec insur covers it  Noting some tendernes and sorene in the msles  Pt worried about muscle tend may be related to the tick disease    Noted no challenging rash with any oth the bites         +    Review of Systems No headache, no major weight loss or weight gain, no chest pain no back pain abdominal pain no change in bowel habits complete ROS otherwise negative     Objective:   Physical Exam  Alert and oriented, vitals reviewed and stable, NAD ENT-TM's and ext canals WNL bilat via otoscopic exam Soft palate, tonsils and post pharynx WNL via oropharyngeal exam Neck-symmetric, no masses; thyroid nonpalpable and nontender Pulmonary-no tachypnea or accessory muscle use; Clear without wheezes via auscultation Card--no abnrml murmurs, rhythm reg and rate WNL Carotid pulses symmetric, without bruits       Assessment & Plan:  Impression 1 recurrent urinary tract infections with symptoms consistent with estrogen depletion.  Patient now on brand-new very pricey estrogen intravaginal supplement.  She feels definitely helps her.  We will maintain this.  2.  Musculoskeletal symptomatology with multiple recent tick bites and patient concerns regarding potential Lyme disease.  Very atypical in presentation but will honor patient's concerns blood work ordered.  Blood work returned negative for Lyme thankfully  Greater than 50% of this 25 minute face to face visit  was spent in counseling and discussion and coordination of care regarding the above diagnosis/diagnosies

## 2017-09-13 LAB — B. BURGDORFI ANTIBODIES: Lyme IgG/IgM Ab: <0.91 {ISR} (ref 0.00–0.90)

## 2017-09-24 DIAGNOSIS — M1711 Unilateral primary osteoarthritis, right knee: Secondary | ICD-10-CM | POA: Diagnosis not present

## 2017-09-27 DIAGNOSIS — L989 Disorder of the skin and subcutaneous tissue, unspecified: Secondary | ICD-10-CM | POA: Diagnosis not present

## 2017-09-27 DIAGNOSIS — D1801 Hemangioma of skin and subcutaneous tissue: Secondary | ICD-10-CM | POA: Diagnosis not present

## 2017-09-27 DIAGNOSIS — D230 Other benign neoplasm of skin of lip: Secondary | ICD-10-CM | POA: Diagnosis not present

## 2017-09-27 DIAGNOSIS — D100 Benign neoplasm of lip: Secondary | ICD-10-CM | POA: Diagnosis not present

## 2017-10-03 DIAGNOSIS — Z09 Encounter for follow-up examination after completed treatment for conditions other than malignant neoplasm: Secondary | ICD-10-CM | POA: Diagnosis not present

## 2017-11-12 ENCOUNTER — Other Ambulatory Visit: Payer: Self-pay

## 2017-11-12 ENCOUNTER — Telehealth: Payer: Self-pay

## 2017-11-12 ENCOUNTER — Telehealth: Payer: Self-pay | Admitting: Family Medicine

## 2017-11-12 MED ORDER — CEFPROZIL 500 MG PO TABS: ORAL_TABLET | ORAL | 0 refills | Status: DC

## 2017-11-12 NOTE — Telephone Encounter (Signed)
ok 

## 2017-11-12 NOTE — Telephone Encounter (Signed)
Patient is wanting a refill on cefzil 500 faxed to [facility].Please advise.

## 2017-11-12 NOTE — Telephone Encounter (Signed)
error 

## 2017-12-25 ENCOUNTER — Ambulatory Visit: Payer: Medicare Other | Admitting: Family Medicine

## 2017-12-25 ENCOUNTER — Encounter: Payer: Self-pay | Admitting: Family Medicine

## 2017-12-25 VITALS — BP 150/92 | Ht 60.0 in | Wt 115.0 lb

## 2017-12-25 DIAGNOSIS — I10 Essential (primary) hypertension: Secondary | ICD-10-CM | POA: Diagnosis not present

## 2017-12-25 DIAGNOSIS — Z23 Encounter for immunization: Secondary | ICD-10-CM | POA: Diagnosis not present

## 2017-12-25 DIAGNOSIS — Z0000 Encounter for general adult medical examination without abnormal findings: Secondary | ICD-10-CM | POA: Diagnosis not present

## 2017-12-25 MED ORDER — LOSARTAN POTASSIUM 50 MG PO TABS: 50.0000 mg | ORAL_TABLET | Freq: Every morning | ORAL | 1 refills | Status: DC

## 2017-12-25 NOTE — Progress Notes (Signed)
Subjective:    Patient ID: [name], female    DOB: March 22, 1940, 77 y.o.   MRN: [id number]  HPI AWV- Annual Wellness Visit  The patient was seen for their annual wellness visit. The patient's past medical history, surgical history, and family history were reviewed. Pertinent vaccines were reviewed ( tetanus, pneumonia, shingles, flu) The patient's medication list was reviewed and updated.  The height and weight were entered.  BMI recorded in electronic record elsewhere  Cognitive screening was completed. Outcome of Mini - Cog: pass   Falls /depression screening electronically recorded within record elsewhere  Current tobacco usage: none  (All patients who use tobacco were given written and verbal information on quitting)  Recent listing of emergency department/hospitalizations over the past year were reviewed.  current specialist the patient sees on a regular basis: dr Domenic Polite - cardiologist   Medicare annual wellness visit patient questionnaire was reviewed.  A written screening schedule for the patient for the next 5-10 years was given. Appropriate discussion of followup regarding next visit was discussed.  Needs refills on losartan.   Wants flu vaccine today. Had shingles vaccine 14 days ago. Pt states pharm told her to wait 2 weeks and today is the 14th day.   Concerns about arthritis in hands.   Substantial arthritis in both hands, few weeks ago developed a flare in the mid hand, no acute injuries,, pulling weed and not a proble,   Blood pressure medicine and blood pressure levels reviewed today with patient. Compliant with blood pressure medicine. States does not miss a dose. No obvious side effects. Blood pressure generally good when checked elsewhere. Watching salt intake.  g daghter is at  [employer] school in [place] and going soon  150 over 87 recently   134 ovdr 84 on repeat   soltis has mammo   l    Review of Systems  Constitutional: Negative for activity  change, appetite change and fatigue.  HENT: Negative for congestion and rhinorrhea.   Eyes: Negative for discharge.  Respiratory: Negative for cough, chest tightness and wheezing.   Cardiovascular: Negative for chest pain.  Gastrointestinal: Negative for abdominal pain, blood in stool and vomiting.  Endocrine: Negative for polyphagia.  Genitourinary: Negative for difficulty urinating and frequency.  Musculoskeletal: Negative for neck pain.  Skin: Negative for color change.  Allergic/Immunologic: Negative for environmental allergies and food allergies.  Neurological: Negative for weakness and headaches.  Psychiatric/Behavioral: Negative for agitation and behavioral problems.  All other systems reviewed and are negative.      Objective:   Physical Exam  Constitutional: She is oriented to person, place, and time. She appears well-developed and well-nourished.  HENT:  Head: Normocephalic.  Right Ear: External ear normal.  Left Ear: External ear normal.  Eyes: Pupils are equal, round, and reactive to light.  Neck: Normal range of motion. No thyromegaly present.  Cardiovascular: Normal rate, regular rhythm, normal heart sounds and intact distal pulses.  No murmur heard. Pulmonary/Chest: Effort normal and breath sounds normal. No respiratory distress. She has no wheezes.  Abdominal: Soft. Bowel sounds are normal. She exhibits no distension and no mass. There is no tenderness.  Genitourinary: Vagina normal and uterus normal.  Musculoskeletal: Normal range of motion. She exhibits no edema or tenderness.  Lymphadenopathy:    She has no cervical adenopathy.  Neurological: She is alert and oriented to person, place, and time. She exhibits normal muscle tone.  Skin: Skin is warm and dry.  Psychiatric: She has a normal  mood and affect. Her behavior is normal.  Vitals reviewed.  Bilateral hands osteoarthritis changes noted.  No substantial metacarpophalangeal joint involvement or swelling.   Some right mid hand dorsal swelling.       Assessment & Plan:  Impression wellness exam.  Diet discussed.  Exercise discussed.  Vaccines discussed and administered.  Colon next due ten yrs, due n 2024  2.  Hypertension.  Discussed to maintain same meds rationale discussed  3.  Hand ostia arthritis.  With recent flare with right mid hand flaring.  Exam consistent with osteoarthritis discussed  Flu shot medications refilled diet exercise discussed follow-up in 6 months

## 2017-12-30 ENCOUNTER — Other Ambulatory Visit: Payer: Self-pay | Admitting: Family Medicine

## 2017-12-30 DIAGNOSIS — Z961 Presence of intraocular lens: Secondary | ICD-10-CM | POA: Diagnosis not present

## 2017-12-30 DIAGNOSIS — Z1231 Encounter for screening mammogram for malignant neoplasm of breast: Secondary | ICD-10-CM | POA: Diagnosis not present

## 2017-12-30 DIAGNOSIS — H40013 Open angle with borderline findings, low risk, bilateral: Secondary | ICD-10-CM | POA: Diagnosis not present

## 2017-12-30 DIAGNOSIS — H26491 Other secondary cataract, right eye: Secondary | ICD-10-CM | POA: Diagnosis not present

## 2018-01-24 ENCOUNTER — Telehealth: Payer: Self-pay | Admitting: Family Medicine

## 2018-01-24 ENCOUNTER — Other Ambulatory Visit: Payer: Self-pay | Admitting: Family Medicine

## 2018-01-24 DIAGNOSIS — I10 Essential (primary) hypertension: Secondary | ICD-10-CM

## 2018-01-24 DIAGNOSIS — Z79899 Other long term (current) drug therapy: Secondary | ICD-10-CM

## 2018-01-24 DIAGNOSIS — Z13220 Encounter for screening for lipoid disorders: Secondary | ICD-10-CM

## 2018-01-24 DIAGNOSIS — Z0000 Encounter for general adult medical examination without abnormal findings: Secondary | ICD-10-CM

## 2018-01-24 DIAGNOSIS — E041 Nontoxic single thyroid nodule: Secondary | ICD-10-CM

## 2018-01-24 NOTE — Telephone Encounter (Signed)
Please advise. Thank you

## 2018-01-24 NOTE — Telephone Encounter (Signed)
Lip liv m7 cbc tsh

## 2018-01-24 NOTE — Telephone Encounter (Signed)
At last appt 12/25/2017 for her physical, pt states there was a discussion in regards of time for her to complete blood work. No orders placed. Advise.

## 2018-01-24 NOTE — Telephone Encounter (Signed)
Lab orders placed and pt notified. Pt verbalized understanding.

## 2018-02-04 DIAGNOSIS — Z0000 Encounter for general adult medical examination without abnormal findings: Secondary | ICD-10-CM | POA: Diagnosis not present

## 2018-02-04 DIAGNOSIS — E041 Nontoxic single thyroid nodule: Secondary | ICD-10-CM | POA: Diagnosis not present

## 2018-02-04 DIAGNOSIS — Z13220 Encounter for screening for lipoid disorders: Secondary | ICD-10-CM | POA: Diagnosis not present

## 2018-02-04 DIAGNOSIS — I10 Essential (primary) hypertension: Secondary | ICD-10-CM | POA: Diagnosis not present

## 2018-02-04 DIAGNOSIS — Z79899 Other long term (current) drug therapy: Secondary | ICD-10-CM | POA: Diagnosis not present

## 2018-02-05 LAB — CBC WITH DIFFERENTIAL/PLATELET
Basophils Absolute: 0.1 10*3/uL (ref 0.0–0.2)
Basos: 1 %
EOS (ABSOLUTE): 0.1 10*3/uL (ref 0.0–0.4)
Eos: 2 %
Hematocrit: 44.1 % (ref 34.0–46.6)
Hemoglobin: 14.6 g/dL (ref 11.1–15.9)
IMMATURE GRANS (ABS): 0 10*3/uL (ref 0.0–0.1)
Immature Granulocytes: 0 %
Lymphocytes Absolute: 1.2 10*3/uL (ref 0.7–3.1)
Lymphs: 28 %
MCH: 31.8 pg (ref 26.6–33.0)
MCHC: 33.1 g/dL (ref 31.5–35.7)
MCV: 96 fL (ref 79–97)
Monocytes Absolute: 0.6 10*3/uL (ref 0.1–0.9)
Monocytes: 14 %
Neutrophils Absolute: 2.3 10*3/uL (ref 1.4–7.0)
Neutrophils: 55 %
Platelets: 252 10*3/uL (ref 150–450)
RBC: 4.59 x10E6/uL (ref 3.77–5.28)
RDW: 11.8 % — ABNORMAL LOW (ref 12.3–15.4)
WBC: 4.2 10*3/uL (ref 3.4–10.8)

## 2018-02-05 LAB — BASIC METABOLIC PANEL
BUN/Creatinine Ratio: 12 (ref 12–28)
BUN: 9 mg/dL (ref 8–27)
CO2: 25 mmol/L (ref 20–29)
Calcium: 10.2 mg/dL (ref 8.7–10.3)
Chloride: 97 mmol/L (ref 96–106)
Creatinine, Ser: 0.78 mg/dL (ref 0.57–1.00)
GFR calc Af Amer: 84 mL/min/{1.73_m2} (ref >59)
GFR calc non Af Amer: 73 mL/min/{1.73_m2} (ref >59)
GLUCOSE: 88 mg/dL (ref 65–99)
Potassium: 4.6 mmol/L (ref 3.5–5.2)
Sodium: 137 mmol/L (ref 134–144)

## 2018-02-05 LAB — LIPID PANEL
Chol/HDL Ratio: 2.2 ratio (ref 0.0–4.4)
Cholesterol, Total: 226 mg/dL — ABNORMAL HIGH (ref 100–199)
HDL: 104 mg/dL (ref >39)
LDL Calculated: 108 mg/dL — ABNORMAL HIGH (ref 0–99)
Triglycerides: 71 mg/dL (ref 0–149)
VLDL Cholesterol Cal: 14 mg/dL (ref 5–40)

## 2018-02-05 LAB — HEPATIC FUNCTION PANEL
ALT: 16 IU/L (ref 0–32)
AST: 22 IU/L (ref 0–40)
Albumin: 4.9 g/dL — ABNORMAL HIGH (ref 3.5–4.8)
Alkaline Phosphatase: 58 IU/L (ref 39–117)
Bilirubin Total: 0.7 mg/dL (ref 0.0–1.2)
Bilirubin, Direct: 0.18 mg/dL (ref 0.00–0.40)
Total Protein: 6.9 g/dL (ref 6.0–8.5)

## 2018-02-05 LAB — TSH: TSH: 2.83 u[IU]/mL (ref 0.450–4.500)

## 2018-02-10 ENCOUNTER — Encounter: Payer: Self-pay | Admitting: Family Medicine

## 2018-02-21 DIAGNOSIS — Z96651 Presence of right artificial knee joint: Secondary | ICD-10-CM | POA: Diagnosis not present

## 2018-02-21 DIAGNOSIS — Z471 Aftercare following joint replacement surgery: Secondary | ICD-10-CM | POA: Diagnosis not present

## 2018-03-04 ENCOUNTER — Telehealth: Payer: Self-pay | Admitting: Family Medicine

## 2018-03-04 NOTE — Telephone Encounter (Signed)
We would need to call [phone] which is the [place] appeal department.

## 2018-03-04 NOTE — Telephone Encounter (Signed)
Pt uses primarily for vaginal atrophy and estrogen depetion, which in turn helps her urinating. 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

## 2018-03-04 NOTE — Telephone Encounter (Signed)
PA done yesterday for Imvexxy Maintenance Pack 4 mcg. Fax from [place] has denied this medication due to medicare allows [place] to cover a drug only when it is a Part D drug. A Part D drug is one that is used for a "medically accepted indication". Imvexxy is not FDA approved for medical condition of Hx of recurrent UTIs. Please advise.  Fax in provider office. Thank you

## 2018-03-05 NOTE — Telephone Encounter (Signed)
Call and tell pt this new med she selected is not covered by her insurance, would she like to go back on her prior meds?I f not, I need a written letter from her including the names of all prior meds she tried for her vaginal atrophy, along with all the perceived benefits from this new brand name med and why she thinks it is better for her so I can try to appeal

## 2018-03-05 NOTE — Telephone Encounter (Signed)
Pt returned call and would like to stay on current medication. Pt will write a letter and drop it by office.

## 2018-03-05 NOTE — Telephone Encounter (Signed)
Left message to return call 

## 2018-03-05 NOTE — Telephone Encounter (Signed)
Spoke with Manuela Schwartz at [employer]. Manuela Schwartz states that provider will need to basically write a letter stating why patient needs this medication. 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 The appeal is a letter essentially. The letter would need to be faxed to [phone] and when they receive and look over the letter; it can take up to 7 business days to get a result. Please advise. Thank you

## 2018-03-06 ENCOUNTER — Telehealth: Payer: Self-pay | Admitting: Family Medicine

## 2018-03-06 NOTE — Telephone Encounter (Signed)
Dropped off envelope in regards of her medication, placed in blue folder in Dr.Steve's box.

## 2018-03-10 NOTE — Telephone Encounter (Signed)
Patient called to check on appeal

## 2018-03-11 NOTE — Telephone Encounter (Signed)
Letter done, be sure to fax today in correct direction, aftew that notify pt submitted

## 2018-03-11 NOTE — Telephone Encounter (Signed)
Appeal letter faxed to insurance and confirmation received. Patient notified. Await decision from insurance company.

## 2018-03-17 NOTE — Telephone Encounter (Signed)
Approved Auth # [id number].

## 2018-03-17 NOTE — Telephone Encounter (Signed)
Patient is aware 

## 2018-03-26 ENCOUNTER — Other Ambulatory Visit: Payer: Self-pay | Admitting: Family Medicine

## 2018-04-15 DIAGNOSIS — H538 Other visual disturbances: Secondary | ICD-10-CM | POA: Diagnosis not present

## 2018-04-15 DIAGNOSIS — H04123 Dry eye syndrome of bilateral lacrimal glands: Secondary | ICD-10-CM | POA: Diagnosis not present

## 2018-06-04 ENCOUNTER — Telehealth: Payer: Self-pay | Admitting: Cardiology

## 2018-06-04 NOTE — Telephone Encounter (Signed)
Virtual Visit Pre-Appointment Phone Call  "(Name), I am calling you today to discuss your upcoming appointment. We are currently trying to limit exposure to the virus that causes COVID-19 by seeing patients at home rather than in the office."  1. "What is the BEST phone number to call the day of the visit?" - include this in appointment notes  2. Do you have or have access to (through a family member/friend) a smartphone with video capability that we can use for your visit?" a. If yes - list this number in appt notes as cell (if different from BEST phone #) and list the appointment type as a VIDEO visit in appointment notes b. If no - list the appointment type as a PHONE visit in appointment notes  3. Confirm consent - "In the setting of the current Covid19 crisis, you are scheduled for a (phone or video) visit with your provider on (date) at (time).  Just as we do with many in-office visits, in order for you to participate in this visit, we must obtain consent.  If you'd like, I can send this to your mychart (if signed up) or email for you to review.  Otherwise, I can obtain your verbal consent now.  All virtual visits are billed to your insurance company just like a normal visit would be.  By agreeing to a virtual visit, we'd like you to understand that the technology does not allow for your provider to perform an examination, and thus may limit your provider's ability to fully assess your condition. If your provider identifies any concerns that need to be evaluated in person, we will make arrangements to do so.  Finally, though the technology is pretty good, we cannot assure that it will always work on either your or our end, and in the setting of a video visit, we may have to convert it to a phone-only visit.  In either situation, we cannot ensure that we have a secure connection.  Are you willing to proceed?" STAFF: Did the patient verbally acknowledge consent to telehealth visit? Document  YES/NO here: Yes  4. Advise patient to be prepared - "Two hours prior to your appointment, go ahead and check your blood pressure, pulse, oxygen saturation, and your weight (if you have the equipment to check those) and write them all down. When your visit starts, your provider will ask you for this information. If you have an Apple Watch or Kardia device, please plan to have heart rate information ready on the day of your appointment. Please have a pen and paper handy nearby the day of the visit as well."  5. Give patient instructions for MyChart download to smartphone OR [email or web address] as below if video visit (depending on what platform provider is using)  6. Inform patient they will receive a phone call 15 minutes prior to their appointment time (may be from unknown caller ID) so they should be prepared to answer    TELEPHONE CALL NOTE  [name] has been deemed a candidate for a follow-up tele-health visit to limit community exposure during the Covid-19 pandemic. I spoke with the patient via phone to ensure availability of phone/video source, confirm preferred email & phone number, and discuss instructions and expectations.  I reminded [name] to be prepared with any vital sign and/or heart rhythm information that could potentially be obtained via home monitoring, at the time of her visit. I reminded [name] to expect a phone call prior to  her visit.  Orinda Kenner 06/04/2018 12:52 PM

## 2018-06-11 NOTE — Progress Notes (Signed)
Virtual Visit via Video Note   This visit type was conducted due to national recommendations for restrictions regarding the COVID-19 Pandemic (e.g. social distancing) in an effort to limit this patient's exposure and mitigate transmission in our community.  Due to her co-morbid illnesses, this patient is at least at moderate risk for complications without adequate follow up.  This format is felt to be most appropriate for this patient at this time.  All issues noted in this document were discussed and addressed.  A limited physical exam was performed with this format.  Please refer to the patient's chart for her consent to telehealth for [facility].   Date:  06/12/2018   ID:  [name], DOB Mar 24, 1940, MRN [id number]  Patient Location: Home Provider Location: Office  PCP:  Mikey Kirschner, MD  Cardiologist:  Rozann Lesches, MD  Evaluation Performed:  Follow-Up Visit  Chief Complaint:  Cardiac follow-up.  History of Present Illness:    [name] is a 78 y.o. female last seen in May 2019.  We communicated via video conferencing today.  She tells me that she has been doing well overall.  No palpitations or syncope.  No exertional chest pain.  She sometimes has a dull epigastric discomfort at nighttime which could be GI in etiology based on description.  This does not occur with exertion.  She had an interval visit with Dr. Wolfgang Phoenix in November 2019, I reviewed the note.  She walks regularly, usually somewhere between 4000-7000 steps daily.  She has a [place] farm that she enjoys.  Also enjoys gardening and doing yoga.  I reviewed her medications which are outlined below.  We did talk about her blood pressure which was elevated today.  She has not been checking it very regularly but plans to be more consistent in case medications need to be adjusted.  She is also going to start taking her losartan in the evening.  The patient does not have symptoms concerning for COVID-19  infection (fever, chills, cough, or new shortness of breath).    Past Medical History:  Diagnosis Date  . Arthritis   . Diverticulosis of colon   . Essential hypertension   . History of basal cell carcinoma (BCC) excision    02-10-2013  left cheek  s/p  moh's sx  . History of palpitations   . History of recurrent UTIs   . History of syncope    2015-- felt to vasovagal response to pain (per cardiologist note)  . IBS (irritable bowel syndrome)   . Open wound of left knee    warm soaks daily /  neosprin and dressing daily / per per still has drainage  . Patellar bursitis of left knee    pre-patella septic bursitis w/ foreign body  . Right thyroid nodule    x2 right side incidental finding on carotid duplex 03/ 2018-- thyroid ultrasound done , benign , follow-up in one year  . Wears hearing aid in both ears    Past Surgical History:  Procedure Laterality Date  . APPENDECTOMY  age 63  . BREAST BIOPSY  1962   benign  . CATARACT EXTRACTION W/PHACO  06/25/2011   Procedure: CATARACT EXTRACTION PHACO AND INTRAOCULAR LENS PLACEMENT (IOC);  Surgeon: Williams Che, MD;  Location: [facility];  Service: Ophthalmology;  Laterality: Right;  CDE: 8.90  . CATARACT EXTRACTION W/PHACO Left 05/05/2012   Procedure: CATARACT EXTRACTION PHACO AND INTRAOCULAR LENS PLACEMENT (IOC);  Surgeon: Williams Che, MD;  Location: [facility];  Service: Ophthalmology;  Laterality: Left;  CDE 12.78  . COLONOSCOPY  last one 11-26-2012  . EXCISION MORTON'S NEUROMA Right 2003  approx.  . INCISION AND DRAINAGE WOUND WITH FOREIGN BODY REMOVAL Left 08/30/2016   Procedure: LEFT KNEE INCISION AND DRAINAGE WOUND WITH FOREIGN BODY REMOVAL;  Surgeon: Sydnee Cabal, MD;  Location: [facility];  Service: Orthopedics;  Laterality: Left;  . KNEE ARTHROPLASTY Right 03/14/2017   Procedure: RIGHT TOTAL KNEE ARTHROPLASTY WITH COMPUTER NAVIGATION AND REMOVAL OF HARDWARE;  Surgeon: Rod Can, MD;  Location: [facility];   Service: Orthopedics;  Laterality: Right;  Adductor Block  . KNEE ARTHROSCOPY W/ ACL RECONSTRUCTION Right 1996  . KNEE CARTILAGE SURGERY Right age 89  . MOHS SURGERY  02/10/2013   left cheek -- BCC  . POSTERIOR REPAIR  05-21-2002   dr Glo Herring   symptomatic recetocele  . TONSILLECTOMY AND ADENOIDECTOMY  age 45  . TRANSTHORACIC ECHOCARDIOGRAM  04-17-2016  dr Domenic Polite   ef 60-65%/  trivial MR/ mild TR  . VAGINAL HYSTERECTOMY  1979   w/  Bilateral Salpingoophorectomy     Current Meds  Medication Sig  . Flaxseed, Linseed, (FLAX PO) Take 1 Dose by mouth every morning.   Marland Kitchen losartan (COZAAR) 50 MG tablet TAKE 1 TABLET BY MOUTH EVERY MORNING.  . metoprolol tartrate (LOPRESSOR) 50 MG tablet TAKE (1) TABLET TWICE DAILY.  . Multiple Vitamins-Minerals (CENTRUM SILVER) tablet Take 1 tablet by mouth daily.  . OMEGA 3 1000 MG CAPS Take 1,000 mg by mouth at bedtime.   . Probiotic Product (PROBIOTIC DAILY PO) Take 1 tablet by mouth daily.  . Psyllium (METAMUCIL PO) Take 1 Scoop by mouth every evening.   . sodium chloride (OCEAN) 0.65 % SOLN nasal spray Place 1 spray into both nostrils daily as needed for congestion.     Allergies:   Codeine and Penicillins   Social History   Tobacco Use  . Smoking status: Former Smoker    Packs/day: 0.50    Years: 10.00    Pack years: 5.00    Types: Cigarettes    Last attempt to quit: 04/30/1962    Years since quitting: 56.1  . Smokeless tobacco: Never Used  Substance Use Topics  . Alcohol use: Yes    Alcohol/week: 14.0 standard drinks    Types: 14 Glasses of wine per week    Comment: 2 wine daily  . Drug use: No     Family Hx: The patient's family history includes CVA in her mother; Hypertension in her mother; Throat cancer in her father. There is no history of Pseudochol deficiency, Malignant hyperthermia, Hypotension, or Anesthesia problems.  ROS:   Please see the history of present illness.    All other systems reviewed and are negative.    Prior CV studies:   The following studies were reviewed today:  Echocardiogram 04/17/2016: Study Conclusions  - Left ventricle: The cavity size was normal. Wall thickness was normal. Systolic function was normal. The estimated ejection fraction was in the range of 60% to 65%. Wall motion was normal; there were no regional wall motion abnormalities. Diastolic dysfunction, grade indeterminate. Normal filling pressures. - Mitral valve: Mildly to moderately calcified annulus. - Tricuspid valve: There was mild regurgitation.  Labs/Other Tests and Data Reviewed:    EKG:  An ECG dated 06/05/2017 was personally reviewed today and demonstrated:  Normal sinus rhythm.  Recent Labs: 02/04/2018: ALT 16; BUN 9; Creatinine, Ser 0.78; Hemoglobin 14.6; Platelets 252; Potassium 4.6;  Sodium 137; TSH 2.830   Recent Lipid Panel Lab Results  Component Value Date/Time   CHOL 226 (H) 02/04/2018 09:04 AM   TRIG 71 02/04/2018 09:04 AM   HDL 104 02/04/2018 09:04 AM   CHOLHDL 2.2 02/04/2018 09:04 AM   CHOLHDL 2.8 01/22/2013 08:03 AM   LDLCALC 108 (H) 02/04/2018 09:04 AM    Wt Readings from Last 3 Encounters:  06/12/18 115 lb 6.4 oz (52.3 kg)  12/25/17 115 lb (52.2 kg)  09/12/17 111 lb 12.8 oz (50.7 kg)     Objective:    Vital Signs:  BP 128/73   Pulse (!) 57   Ht 5' (1.524 m)   Wt 115 lb 6.4 oz (52.3 kg)   LMP  (LMP Unknown)   BMI 22.54 kg/m    Repeat blood pressure check today 162/86  General: Pleasant elderly woman, appears comfortable at rest seated in her home. HEENT: Conjunctiva and lids normal. Skin: Color and turgor appear normal. Lungs: Breathing nonlabored while speaking in full sentences.  No audible wheezing. Neuropsychiatric: Conjugate gaze.  Voice tone and speech pattern normal.  Patient moves all extremities.  Affect is appropriate.  ASSESSMENT & PLAN:    1.  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  She remains on beta-blocker.   Continue regular activity and observation.  2.  Essential hypertension, currently on Lopressor and Cozaar.  She will continue to monitor blood pressure more closely at home and follow-up with Dr. Wolfgang Phoenix.  3.  History of palpitations, no recurrences over the last year.  She remains on beta-blocker.  COVID-19 Education: The signs and symptoms of COVID-19 were discussed with the patient and how to seek care for testing (follow up with PCP or arrange E-visit).  The importance of social distancing was discussed today.  Time:   Today, I have spent 10 minutes with the patient with telehealth technology discussing the above problems.     Medication Adjustments/Labs and Tests Ordered: Current medicines are reviewed at length with the patient today.  Concerns regarding medicines are outlined above.   Tests Ordered: No orders of the defined types were placed in this encounter.   Medication Changes: No orders of the defined types were placed in this encounter.   Disposition:  Follow up 1 year in the office.  Signed, Rozann Lesches, MD  06/12/2018 2:33 PM    [place]

## 2018-06-12 ENCOUNTER — Telehealth (INDEPENDENT_AMBULATORY_CARE_PROVIDER_SITE_OTHER): Payer: Medicare Other | Admitting: Cardiology

## 2018-06-12 ENCOUNTER — Other Ambulatory Visit: Payer: Self-pay | Admitting: Family Medicine

## 2018-06-12 ENCOUNTER — Encounter: Payer: Self-pay | Admitting: Cardiology

## 2018-06-12 VITALS — BP 128/73 | HR 57 | Ht 60.0 in | Wt 115.4 lb

## 2018-06-12 DIAGNOSIS — Z87898 Personal history of other specified conditions: Secondary | ICD-10-CM | POA: Diagnosis not present

## 2018-06-12 DIAGNOSIS — R002 Palpitations: Secondary | ICD-10-CM

## 2018-06-12 DIAGNOSIS — Z7189 Other specified counseling: Secondary | ICD-10-CM | POA: Diagnosis not present

## 2018-06-12 DIAGNOSIS — I10 Essential (primary) hypertension: Secondary | ICD-10-CM

## 2018-06-12 NOTE — Patient Instructions (Addendum)
Medication Instructions:   Your physician recommends that you continue on your current medications as directed. Please refer to the Current Medication list given to you today.  Labwork:  NONE  Testing/Procedures:  NONE  Follow-Up:  Your physician recommends that you schedule a follow-up appointment in: one year. You will receive a reminder letter in the mail in about 10 months reminding you to call and schedule your appointment. If you don't receive this letter, please contact our office.  Any Other Special Instructions Will Be Listed Below (If Applicable).  If you need a refill on your cardiac medications before your next appointment, please call your pharmacy.

## 2018-06-19 ENCOUNTER — Telehealth: Payer: Self-pay

## 2018-06-19 ENCOUNTER — Telehealth: Payer: Self-pay | Admitting: Cardiology

## 2018-06-19 NOTE — Telephone Encounter (Signed)
Please call patient to review her recent AVS.

## 2018-06-19 NOTE — Telephone Encounter (Signed)
Pt states that her blood pressure she reported her blood pressure at 162/86 during her telehealth visit with Dr. Domenic Polite. On her AVS printout is wasn't correct. She is also taking her losartan at night now and not in the morning.

## 2018-07-22 DIAGNOSIS — Z961 Presence of intraocular lens: Secondary | ICD-10-CM | POA: Diagnosis not present

## 2018-07-22 DIAGNOSIS — H04123 Dry eye syndrome of bilateral lacrimal glands: Secondary | ICD-10-CM | POA: Diagnosis not present

## 2018-08-11 ENCOUNTER — Telehealth: Payer: Self-pay | Admitting: Family Medicine

## 2018-08-11 DIAGNOSIS — Z79899 Other long term (current) drug therapy: Secondary | ICD-10-CM

## 2018-08-11 DIAGNOSIS — Z13220 Encounter for screening for lipoid disorders: Secondary | ICD-10-CM

## 2018-08-11 DIAGNOSIS — E041 Nontoxic single thyroid nodule: Secondary | ICD-10-CM

## 2018-08-11 DIAGNOSIS — I10 Essential (primary) hypertension: Secondary | ICD-10-CM

## 2018-08-11 DIAGNOSIS — Z0000 Encounter for general adult medical examination without abnormal findings: Secondary | ICD-10-CM

## 2018-08-11 NOTE — Telephone Encounter (Signed)
Rep same 

## 2018-08-11 NOTE — Telephone Encounter (Signed)
Pt is going to contact office to set up 6 month follow up. Pt would like lab orders. Last labs in Dec CBC, TSH, BMP, LIPID and Liver. Please advise. Thank you

## 2018-08-12 NOTE — Telephone Encounter (Signed)
Pt contacted and verbalized understanding.  

## 2018-08-12 NOTE — Addendum Note (Signed)
Addended by: Vicente Males on: 08/12/2018 01:28 PM   Modules accepted: Orders

## 2018-08-12 NOTE — Telephone Encounter (Signed)
no

## 2018-08-12 NOTE — Telephone Encounter (Signed)
Labs ordered and pt is aware. Pt is setting up appt for her 6 month follow up. Pt is wanting to know if you recommend her having a thyroid scan before coming in for her follow up. Please advise. Thank you.

## 2018-09-08 ENCOUNTER — Other Ambulatory Visit: Payer: Self-pay

## 2018-09-08 ENCOUNTER — Encounter: Payer: Self-pay | Admitting: Family Medicine

## 2018-09-08 ENCOUNTER — Ambulatory Visit (INDEPENDENT_AMBULATORY_CARE_PROVIDER_SITE_OTHER): Payer: Medicare Other | Admitting: Family Medicine

## 2018-09-08 ENCOUNTER — Telehealth: Payer: Self-pay | Admitting: Family Medicine

## 2018-09-08 VITALS — BP 119/65

## 2018-09-08 DIAGNOSIS — E041 Nontoxic single thyroid nodule: Secondary | ICD-10-CM

## 2018-09-08 DIAGNOSIS — I10 Essential (primary) hypertension: Secondary | ICD-10-CM

## 2018-09-08 MED ORDER — LOSARTAN POTASSIUM 50 MG PO TABS: 50.0000 mg | ORAL_TABLET | Freq: Every morning | ORAL | 1 refills | Status: DC

## 2018-09-08 MED ORDER — METOPROLOL TARTRATE 50 MG PO TABS: ORAL_TABLET | ORAL | 1 refills | Status: DC

## 2018-09-08 NOTE — Telephone Encounter (Signed)
error 

## 2018-09-08 NOTE — Progress Notes (Signed)
   Subjective:  Audio plus visual  Patient ID: [name], female    DOB: 09-Jul-1940, 78 y.o.   MRN: [id number]  Hypertension This is a chronic problem. There are no compliance problems.   pt has been taking blood pressure regular. Pt states sometimes she gets a pain under diaphragm that radiates to back. Pain comes and goes.  Pt would also like to thyroid nodule rechecked also. Virtual Visit via Video Note  I connected with [name] on 09/08/18 at 10:00 AM EDT by a video enabled telemedicine application and verified that I am speaking with the correct person using two identifiers.  Location: Patient: home Provider: office   I discussed the limitations of evaluation and management by telemedicine and the availability of in person appointments. The patient expressed understanding and agreed to proceed.  History of Present Illness:    Observations/Objective:   Assessment and Plan:   Follow Up Instructions:    I discussed the assessment and treatment plan with the patient. The patient was provided an opportunity to ask questions and all were answered. The patient agreed with the plan and demonstrated an understanding of the instructions.   The patient was advised to call back or seek an in-person evaluation if the symptoms worsen or if the condition fails to improve as anticipated.  I provided 25 minutes of non-face-to-face time during this encounter.   Vicente Males, LPN   Blood pressure medicine and blood pressure levels reviewed today with patient. Compliant with blood pressure medicine. States does not miss a dose. No obvious side effects. Blood pressure generally good when checked elsewhere. Watching salt intake.   Patient has history of thyroid cyst.  Had biopsy fine-needle over a year ago was negative.  Had a couple other nodules are present.  Notes more swelling in the neck.  Patient specialist and advised her to get another follow-up scan  Patient notes  worsening symptoms of tinnitus.  Sees an audiologist.  Has not seen  Review of Systems No headache, no major weight loss or weight gain, no chest pain no back pain abdominal pain no change in bowel habits complete ROS otherwise negative     Objective:   Physical Exam  Virtual      Assessment & Plan:  Impression hypertension overall good control discussed to maintain same meds diet exercise discussed  2.  Thyroid nodule.  Repeat ultrasound for appropriate follow-up is recommended per specialist  3.  Tinnitus worsening advised to get back to her audiologist  Follow-up in 6 months for wellness plus chronic  Patient had called to request blood work.  Today states that she did not want to do that.  So we will cancel

## 2018-09-09 ENCOUNTER — Other Ambulatory Visit: Payer: Self-pay | Admitting: Family Medicine

## 2018-09-10 ENCOUNTER — Other Ambulatory Visit: Payer: Self-pay | Admitting: Family Medicine

## 2018-09-11 ENCOUNTER — Telehealth: Payer: Self-pay | Admitting: *Deleted

## 2018-09-11 ENCOUNTER — Other Ambulatory Visit: Payer: Self-pay | Admitting: Family Medicine

## 2018-09-11 NOTE — Telephone Encounter (Signed)
LMRC

## 2018-09-11 NOTE — Telephone Encounter (Signed)
Patient needs a refill of his cefzil. Patient states she take a pill after sexual intercourse to prevent UTIs  [facility]

## 2018-09-12 MED ORDER — CEFPROZIL 500 MG PO TABS: ORAL_TABLET | ORAL | 5 refills | Status: DC

## 2018-09-12 NOTE — Telephone Encounter (Signed)
Ref as before plus six ref

## 2018-09-12 NOTE — Telephone Encounter (Signed)
Prescription sent electronically to pharmacy. Patient notified. 

## 2018-09-12 NOTE — Addendum Note (Signed)
Addended by: Dairl Ponder on: 09/12/2018 10:46 AM   Modules accepted: Orders

## 2018-09-16 ENCOUNTER — Other Ambulatory Visit: Payer: Self-pay | Admitting: Dermatology

## 2018-09-16 DIAGNOSIS — M8589 Other specified disorders of bone density and structure, multiple sites: Secondary | ICD-10-CM | POA: Diagnosis not present

## 2018-09-16 DIAGNOSIS — Z8262 Family history of osteoporosis: Secondary | ICD-10-CM | POA: Diagnosis not present

## 2018-09-16 DIAGNOSIS — L570 Actinic keratosis: Secondary | ICD-10-CM | POA: Diagnosis not present

## 2018-09-16 DIAGNOSIS — R29890 Loss of height: Secondary | ICD-10-CM | POA: Diagnosis not present

## 2018-09-16 DIAGNOSIS — D485 Neoplasm of uncertain behavior of skin: Secondary | ICD-10-CM | POA: Diagnosis not present

## 2018-09-16 DIAGNOSIS — Z96651 Presence of right artificial knee joint: Secondary | ICD-10-CM | POA: Diagnosis not present

## 2018-09-16 DIAGNOSIS — D229 Melanocytic nevi, unspecified: Secondary | ICD-10-CM | POA: Diagnosis not present

## 2018-09-16 DIAGNOSIS — L821 Other seborrheic keratosis: Secondary | ICD-10-CM | POA: Diagnosis not present

## 2018-09-16 DIAGNOSIS — L739 Follicular disorder, unspecified: Secondary | ICD-10-CM | POA: Diagnosis not present

## 2018-09-19 ENCOUNTER — Other Ambulatory Visit: Payer: Self-pay

## 2018-09-19 ENCOUNTER — Ambulatory Visit (HOSPITAL_COMMUNITY)
Admission: RE | Admit: 2018-09-19 | Discharge: 2018-09-19 | Disposition: A | Discharge status: Home / Self Care | Payer: Medicare Other | Source: Ambulatory Visit | Attending: Family Medicine | Admitting: Family Medicine

## 2018-09-19 DIAGNOSIS — E041 Nontoxic single thyroid nodule: Secondary | ICD-10-CM | POA: Insufficient documentation

## 2018-09-22 ENCOUNTER — Ambulatory Visit (HOSPITAL_COMMUNITY): Payer: Medicare Other

## 2018-09-22 ENCOUNTER — Telehealth: Payer: Self-pay | Admitting: *Deleted

## 2018-09-22 NOTE — Telephone Encounter (Signed)
Report from [facility] mammography with bone density test results. Per dr Richardson Landry call pt and tell her congrats, bones are stronger than 2 years ago. CST. I called pt and she verbalized understanding. Report sent to medical records to be scanned.

## 2018-10-06 DIAGNOSIS — H40023 Open angle with borderline findings, high risk, bilateral: Secondary | ICD-10-CM | POA: Diagnosis not present

## 2018-10-06 DIAGNOSIS — Z961 Presence of intraocular lens: Secondary | ICD-10-CM | POA: Diagnosis not present

## 2018-10-06 DIAGNOSIS — H04123 Dry eye syndrome of bilateral lacrimal glands: Secondary | ICD-10-CM | POA: Diagnosis not present

## 2018-10-06 DIAGNOSIS — H35351 Cystoid macular degeneration, right eye: Secondary | ICD-10-CM | POA: Diagnosis not present

## 2018-11-15 ENCOUNTER — Other Ambulatory Visit: Payer: Self-pay | Admitting: Family Medicine

## 2018-11-19 ENCOUNTER — Other Ambulatory Visit: Payer: Self-pay | Admitting: Family Medicine

## 2018-11-21 ENCOUNTER — Telehealth: Payer: Self-pay | Admitting: Family Medicine

## 2018-11-21 ENCOUNTER — Other Ambulatory Visit: Payer: Self-pay

## 2018-11-21 MED ORDER — IMVEXXY MAINTENANCE PACK 4 MCG VA INST: 4.0000 ug | VAGINAL_INSERT | Freq: Every day | VAGINAL | 1 refills | Status: DC | PRN

## 2018-11-21 NOTE — Telephone Encounter (Signed)
Pt states she has been taking imvexxy 32mcg vaginal supp rx number [id number] for a year and a half. Its not on med list but on med history. Can she [facility] refills.  [facility]

## 2018-11-21 NOTE — Telephone Encounter (Signed)
Medication sent to [facility] and pt is aware

## 2018-11-21 NOTE — Telephone Encounter (Signed)
LMRC

## 2018-11-21 NOTE — Telephone Encounter (Signed)
Yes one yr

## 2018-11-21 NOTE — Telephone Encounter (Signed)
Patient is calling about her Estradiol prescription.  She said the pharmacy has sent in a refill request twice and today they told her it was denied?   [facility]

## 2018-12-08 ENCOUNTER — Ambulatory Visit (INDEPENDENT_AMBULATORY_CARE_PROVIDER_SITE_OTHER): Payer: Medicare Other | Admitting: Family Medicine

## 2018-12-08 ENCOUNTER — Encounter: Payer: Self-pay | Admitting: Family Medicine

## 2018-12-08 ENCOUNTER — Other Ambulatory Visit: Payer: Self-pay

## 2018-12-08 DIAGNOSIS — Z20828 Contact with and (suspected) exposure to other viral communicable diseases: Secondary | ICD-10-CM | POA: Diagnosis not present

## 2018-12-08 DIAGNOSIS — Z20822 Contact with and (suspected) exposure to covid-19: Secondary | ICD-10-CM

## 2018-12-08 DIAGNOSIS — R197 Diarrhea, unspecified: Secondary | ICD-10-CM

## 2018-12-08 NOTE — Progress Notes (Signed)
   Subjective:  Telephone  Patient ID: [name], female    DOB: 1940-06-22, 78 y.o.   MRN: [id number]  HPIdiarrhea since late Friday afternoon. Stool was very loose but not runny. Had one epidisode on Friday then Saturday and Sunday off and on. Maybe twice on Saturday and twice and Sunday. Woke up this morning and did not make it to bathroom and since has been going every hour. No fever.   Virtual Visit via Telephone Note  I connected with [name] on 12/08/18 at  9:30 AM EST by telephone and verified that I am speaking with the correct person using two identifiers.  Location: Patient: home Provider: office   I discussed the limitations, risks, security and privacy concerns of performing an evaluation and management service by telephone and the availability of in person appointments. I also discussed with the patient that there may be a patient responsible charge related to this service. The patient expressed understanding and agreed to proceed.   History of Present Illness:    Observations/Objective:   Assessment and Plan:   Follow Up Instructions:    I discussed the assessment and treatment plan with the patient. The patient was provided an opportunity to ask questions and all were answered. The patient agreed with the plan and demonstrated an understanding of the instructions.   The patient was advised to call back or seek an in-person evaluation if the symptoms worsen or if the condition fails to improve as anticipated.  I provided 25 minutes of non-face-to-face time during this encounter.   Patient has been doing some traveling..  Also went to a ball game for her grandson.  Went to [place].  Also eating out.  Therefore some risk factors  No congestion cough drainage.  No shortness of breath no fever  Diarrhea fairly profuse.  No recent significant antibiotics.  Takes probiotics  Still has appetite     Review of Systems No headache, no major weight  loss or weight gain, no chest pain no back pain abdominal pain no change in bowel habits complete ROS otherwise negative     Objective:   Physical Exam   Virtual     Assessment & Plan:  Impression gastroenteritis with suspected/potential COVID-19.  This could be a simple virus or response to food.  We will do some stool studies.  May use Imodium.  Diet discussed.  Warning signs discussed.  Hold off on ER visit rationale discussed.  COVID-19 testing further recommendations based on results

## 2018-12-09 ENCOUNTER — Other Ambulatory Visit: Payer: Self-pay

## 2018-12-09 DIAGNOSIS — Z20822 Contact with and (suspected) exposure to covid-19: Secondary | ICD-10-CM

## 2018-12-10 ENCOUNTER — Telehealth: Payer: Self-pay | Admitting: Family Medicine

## 2018-12-10 LAB — CLOSTRIDIUM DIFFICILE BY PCR: Toxigenic C. Difficile by PCR: NEGATIVE

## 2018-12-10 LAB — SPECIMEN STATUS REPORT

## 2018-12-10 LAB — NOVEL CORONAVIRUS, NAA: SARS-CoV-2, NAA: NOT DETECTED

## 2018-12-10 NOTE — Telephone Encounter (Signed)
Pt had COVID test yesterday morning. Pt dropped stool samples off Monday.Pt verbalized that she knows her results will not be back at this time.  Pt states nothing has changed. 7th day of diarrhea. Not out of control. Stool texture has not changed, bland diet. Goes about 5 times a day. Lower abdominal discomfort. Not large BM; pt is able to keep fluids down, drinking plenty of water. So far temp is normal.  Pt states she just wanted to give [place] an update.

## 2018-12-10 NOTE — Telephone Encounter (Signed)
Pt contacted and verbalized understanding.  

## 2018-12-10 NOTE — Telephone Encounter (Signed)
thx can let her know c diff is negtive, other tests are pending

## 2018-12-10 NOTE — Telephone Encounter (Signed)
Patient calling with update, no better. Wanted to talk to nurse.

## 2018-12-12 ENCOUNTER — Telehealth: Payer: Self-pay | Admitting: Family Medicine

## 2018-12-12 ENCOUNTER — Other Ambulatory Visit: Payer: Self-pay | Admitting: Family Medicine

## 2018-12-12 LAB — STOOL CULTURE: E coli, Shiga toxin Assay: NEGATIVE

## 2018-12-12 LAB — SPECIMEN STATUS REPORT

## 2018-12-12 MED ORDER — DIPHENOXYLATE-ATROPINE 2.5-0.025 MG PO TABS: ORAL_TABLET | ORAL | 0 refills | Status: DC

## 2018-12-12 NOTE — Telephone Encounter (Signed)
Pt contacted and verbalized understanding. Contacted [facility] to make sure they will deliver it and they stated they will deliver to patient.

## 2018-12-12 NOTE — Telephone Encounter (Signed)
Pt contacted office. Pt is still having diarrhea. Pt was seen 12/08/2018 with diarrhea. Labs test done and pending. COVID test done. Pt states she goes about 7-8 times a day. Has been up off and on since 3 am. No cramping, no fever, has a little bit of lower abdominal cramping. No blood or mucus in stool. Pt states she was not prescribed any medications. Pt has been following a bland diet (cream of wheat, chicken noodle soup, yogurt)   [facility] (please deliver)  Please advise. Thank you

## 2018-12-12 NOTE — Telephone Encounter (Signed)
I will go ahead and send in a prescription for Lomotil May take 1 tablet 3 times daily as needed diarrhea if diarrhea has not checked by Monday please call back  Obviously if bloody stools high fevers or worse may need to go to ER

## 2018-12-15 ENCOUNTER — Encounter (HOSPITAL_COMMUNITY): Payer: Self-pay | Admitting: Emergency Medicine

## 2018-12-15 ENCOUNTER — Telehealth: Payer: Self-pay | Admitting: *Deleted

## 2018-12-15 ENCOUNTER — Emergency Department (HOSPITAL_COMMUNITY)
Admission: EM | Admit: 2018-12-15 | Discharge: 2018-12-15 | Disposition: A | Discharge status: Home / Self Care | Payer: Medicare Other | Attending: Emergency Medicine | Admitting: Emergency Medicine

## 2018-12-15 ENCOUNTER — Other Ambulatory Visit: Payer: Self-pay

## 2018-12-15 DIAGNOSIS — Z87891 Personal history of nicotine dependence: Secondary | ICD-10-CM | POA: Diagnosis not present

## 2018-12-15 DIAGNOSIS — N3090 Cystitis, unspecified without hematuria: Secondary | ICD-10-CM | POA: Diagnosis not present

## 2018-12-15 DIAGNOSIS — Z96651 Presence of right artificial knee joint: Secondary | ICD-10-CM | POA: Diagnosis not present

## 2018-12-15 DIAGNOSIS — Z79899 Other long term (current) drug therapy: Secondary | ICD-10-CM | POA: Diagnosis not present

## 2018-12-15 DIAGNOSIS — R300 Dysuria: Secondary | ICD-10-CM | POA: Diagnosis present

## 2018-12-15 LAB — URINALYSIS, ROUTINE W REFLEX MICROSCOPIC
Bacteria, UA: NONE SEEN
Bilirubin Urine: NEGATIVE
Glucose, UA: NEGATIVE mg/dL
Ketones, ur: NEGATIVE mg/dL
Nitrite: POSITIVE — AB
Protein, ur: NEGATIVE mg/dL
Specific Gravity, Urine: 1.004 — ABNORMAL LOW (ref 1.005–1.030)
WBC, UA: >50 WBC/hpf — ABNORMAL HIGH (ref 0–5)
pH: 7 (ref 5.0–8.0)

## 2018-12-15 MED ORDER — CIPROFLOXACIN HCL 500 MG PO TABS: 500.0000 mg | ORAL_TABLET | Freq: Two times a day (BID) | ORAL | 0 refills | Status: AC

## 2018-12-15 MED ORDER — CIPROFLOXACIN HCL 250 MG PO TABS
500.0000 mg | ORAL_TABLET | Freq: Once | ORAL | Status: AC
  Administered 2018-12-15: 500 mg via ORAL
  Filled 2018-12-15: qty 2

## 2018-12-15 NOTE — ED Triage Notes (Signed)
Pt c/o painful urination and some incontinence that started tonight. Pt states she has had diarrhea x 10 days. Pt also c/o n/v.

## 2018-12-15 NOTE — Discharge Instructions (Signed)
You have been diagnosed with a urinary tract infection.  You also have high blood pressure this evening.  Make sure that you are taking your blood pressure medication as appropriate, additionally start taking ciprofloxacin 500 mg by mouth twice a day for the next 7 days.  I have given you the first dose this evening.  The next dose will be tomorrow morning.  You will need to go to the pharmacy to pick it up.  Please return to the emergency department if you develop any increasing pain, fever, vomiting or any other severe or worsening symptoms.

## 2018-12-15 NOTE — Telephone Encounter (Signed)
Please see result note. When I called about results today she had some concerns about her diarrhea

## 2018-12-15 NOTE — Telephone Encounter (Signed)
Left message to return call 

## 2018-12-15 NOTE — Telephone Encounter (Signed)
We are getting a nearly daily report on her diarrhea, I'm sorry, she should stay on the lomotil one twice per day and not try to "back off", her ref is nov 18 not dec 18.

## 2018-12-15 NOTE — ED Provider Notes (Signed)
[facility] EMERGENCY DEPARTMENT Provider Note   CSN: [id number] Arrival date & time: 12/15/18  2115     History   Chief Complaint Chief Complaint  Patient presents with  . Dysuria    HPI [name] is a 78 y.o. female.     HPI  12 y/io female - has had diarrhea for the last 10 days Reports it is constant - improved significantly with imodium as Rx by her PCP - who did stool studies for pathogens, culture and C dif and were all neg this week. In the last 24 hours she developed some dysuria and frequency and pelvic pain - she has frequent UTI's =- keeps AZO at home and had dose of Cefprozil as well - didn't help No f/c/n/v and no back pain. Sx are moderate, gradually worsening.  Past Medical History:  Diagnosis Date  . Arthritis   . Diverticulosis of colon   . Essential hypertension   . History of basal cell carcinoma (BCC) excision    02-10-2013  left cheek  s/p  moh's sx  . History of palpitations   . History of recurrent UTIs   . History of syncope    2015-- felt to vasovagal response to pain (per cardiologist note)  . IBS (irritable bowel syndrome)   . Open wound of left knee    warm soaks daily /  neosprin and dressing daily / per per still has drainage  . Patellar bursitis of left knee    pre-patella septic bursitis w/ foreign body  . Right thyroid nodule    x2 right side incidental finding on carotid duplex 03/ 2018-- thyroid ultrasound done , benign , follow-up in one year  . Wears hearing aid in both ears     Patient Active Problem List   Diagnosis Date Noted  . Post-traumatic osteoarthritis of right knee 03/14/2017  . S/P knee surgery 08/30/2016  . History of recurrent UTIs 09/19/2015  . Osteopenia 05/21/2015  . Osteoarthritis of right hip 05/21/2015  . Arrhythmia, long term 02/15/2014  . Esophageal reflux 02/15/2014  . Chronic back pain 06/01/2013  . Change in bowel habits 11/21/2012  . Diarrhea 11/21/2012    Past Surgical History:   Procedure Laterality Date  . APPENDECTOMY  age 13  . BREAST BIOPSY  1962   benign  . CATARACT EXTRACTION W/PHACO  06/25/2011   Procedure: CATARACT EXTRACTION PHACO AND INTRAOCULAR LENS PLACEMENT (IOC);  Surgeon: Williams Che, MD;  Location: [facility];  Service: Ophthalmology;  Laterality: Right;  CDE: 8.90  . CATARACT EXTRACTION W/PHACO Left 05/05/2012   Procedure: CATARACT EXTRACTION PHACO AND INTRAOCULAR LENS PLACEMENT (IOC);  Surgeon: Williams Che, MD;  Location: [facility];  Service: Ophthalmology;  Laterality: Left;  CDE 12.78  . COLONOSCOPY  last one 11-26-2012  . EXCISION MORTON'S NEUROMA Right 2003  approx.  . INCISION AND DRAINAGE WOUND WITH FOREIGN BODY REMOVAL Left 08/30/2016   Procedure: LEFT KNEE INCISION AND DRAINAGE WOUND WITH FOREIGN BODY REMOVAL;  Surgeon: Sydnee Cabal, MD;  Location: [facility];  Service: Orthopedics;  Laterality: Left;  . KNEE ARTHROPLASTY Right 03/14/2017   Procedure: RIGHT TOTAL KNEE ARTHROPLASTY WITH COMPUTER NAVIGATION AND REMOVAL OF HARDWARE;  Surgeon: Rod Can, MD;  Location: [facility];  Service: Orthopedics;  Laterality: Right;  Adductor Block  . KNEE ARTHROSCOPY W/ ACL RECONSTRUCTION Right 1996  . KNEE CARTILAGE SURGERY Right age 10  . MOHS SURGERY  02/10/2013   left cheek -- BCC  . POSTERIOR  REPAIR  05-21-2002   dr Glo Herring   symptomatic recetocele  . TONSILLECTOMY AND ADENOIDECTOMY  age 59  . TRANSTHORACIC ECHOCARDIOGRAM  04-17-2016  dr Domenic Polite   ef 60-65%/  trivial MR/ mild TR  . VAGINAL HYSTERECTOMY  1979   w/  Bilateral Salpingoophorectomy     OB History   No obstetric history on file.      Home Medications    Prior to Admission medications   Medication Sig Start Date End Date Taking? Authorizing Provider  diphenoxylate-atropine (LOMOTIL) 2.5-0.025 MG tablet 1 tablet 3 times daily as needed diarrhea Patient taking differently: Take 1 tablet by mouth 3 (three) times daily as needed for diarrhea or loose stools.   12/12/18  Yes Kathyrn Drown, MD  Estradiol (IMVEXXY MAINTENANCE PACK) 4 MCG INST Place 4 mcg vaginally daily as needed. Use every 3 -4 days Patient taking differently: Place 4 mcg vaginally 2 (two) times a week. Wednesdays and Saturdays 11/21/18  Yes Mikey Kirschner, MD  Flaxseed, Linseed, (FLAX PO) Take 1 Dose by mouth every morning.    Yes [provider]  losartan (COZAAR) 50 MG tablet Take 1 tablet (50 mg total) by mouth every morning. 09/08/18  Yes Mikey Kirschner, MD  metoprolol tartrate (LOPRESSOR) 50 MG tablet TAKE (1) TABLET TWICE DAILY. Patient taking differently: Take 50 mg by mouth 2 (two) times daily.  09/08/18  Yes Mikey Kirschner, MD  Multiple Vitamins-Minerals (CENTRUM SILVER) tablet Take 1 tablet by mouth daily.   Yes [provider]  OMEGA 3 1000 MG CAPS Take 1,000 mg by mouth at bedtime.    Yes [provider]  phenazopyridine (AZO-STANDARD) 95 MG tablet Take 95-190 mg by mouth once as needed for pain.   Yes [provider]  Probiotic Product (PROBIOTIC DAILY PO) Take 1 tablet by mouth daily.   Yes [provider]  Psyllium (METAMUCIL PO) Take 1 Scoop by mouth every evening.    Yes [provider]  sodium chloride (OCEAN) 0.65 % SOLN nasal spray Place 1 spray into both nostrils daily as needed for congestion.   Yes [provider]  ciprofloxacin (CIPRO) 500 MG tablet Take 1 tablet (500 mg total) by mouth 2 (two) times daily for 7 days. 12/15/18 12/22/18  Noemi Chapel, MD    Family History Family History  Problem Relation Age of Onset  . Hypertension Mother   . CVA Mother   . Throat cancer Father   . Pseudochol deficiency Neg Hx   . Malignant hyperthermia Neg Hx   . Hypotension Neg Hx   . Anesthesia problems Neg Hx     Social History Social History   Tobacco Use  . Smoking status: Former Smoker    Packs/day: 0.50    Years: 10.00    Pack years: 5.00    Types: Cigarettes    Quit date: 04/30/1962     Years since quitting: 56.6  . Smokeless tobacco: Never Used  Substance Use Topics  . Alcohol use: Yes    Alcohol/week: 14.0 standard drinks    Types: 14 Glasses of wine per week    Comment: 2 wine daily  . Drug use: No     Allergies   Codeine and Penicillins   Review of Systems Review of Systems  Constitutional: Negative for fever.  Gastrointestinal: Positive for diarrhea. Negative for nausea and vomiting.  Genitourinary: Positive for difficulty urinating, dysuria, frequency, pelvic pain and urgency.     Physical Exam Updated Vital Signs  BP (!) 193/112 (BP Location: Right Arm)   Pulse 81   Temp (!) 97 F (36.1 C) (Temporal)   Resp 16   Ht 1.549 m (5\' 1" )   Wt 49.9 kg   LMP  (LMP Unknown)   BMI 20.78 kg/m   Physical Exam Vitals signs and nursing note reviewed.  Constitutional:      General: She is not in acute distress.    Appearance: She is well-developed.  HENT:     Head: Normocephalic and atraumatic.     Comments: Hearing impaired    Mouth/Throat:     Pharynx: No oropharyngeal exudate.  Eyes:     General: No scleral icterus.       Right eye: No discharge.        Left eye: No discharge.     Conjunctiva/sclera: Conjunctivae normal.     Pupils: Pupils are equal, round, and reactive to light.  Neck:     Musculoskeletal: Normal range of motion and neck supple.     Thyroid: No thyromegaly.     Vascular: No JVD.  Cardiovascular:     Rate and Rhythm: Normal rate and regular rhythm.     Heart sounds: Normal heart sounds. No murmur. No friction rub. No gallop.   Pulmonary:     Effort: Pulmonary effort is normal. No respiratory distress.     Breath sounds: Normal breath sounds. No wheezing or rales.  Abdominal:     General: Bowel sounds are normal. There is no distension.     Palpations: Abdomen is soft. There is no mass.     Tenderness: There is no abdominal tenderness.     Comments: Totally non tender abdomen  Musculoskeletal: Normal range of motion.         General: No tenderness.  Lymphadenopathy:     Cervical: No cervical adenopathy.  Skin:    General: Skin is warm and dry.     Findings: No erythema or rash.  Neurological:     Mental Status: She is alert.     Coordination: Coordination normal.  Psychiatric:        Behavior: Behavior normal.      ED Treatments / Results  Labs (all labs ordered are listed, but only abnormal results are displayed) Labs Reviewed  URINALYSIS, ROUTINE W REFLEX MICROSCOPIC - Abnormal; Notable for the following components:      Result Value   Color, Urine AMBER (*)    Specific Gravity, Urine 1.004 (*)    Hgb urine dipstick MODERATE (*)    Nitrite POSITIVE (*)    Leukocytes,Ua MODERATE (*)    WBC, UA >50 (*)    All other components within normal limits  URINE CULTURE    EKG None  Radiology No results found.  Procedures Procedures (including critical care time)  Medications Ordered in ED Medications  ciprofloxacin (CIPRO) tablet 500 mg (has no administration in time range)     Initial Impression / Assessment and Plan / ED Course  I have reviewed the triage vital signs and the nursing notes.  Pertinent labs & imaging results that were available during my care of the patient were reviewed by me and considered in my medical decision making (see chart for details).  Clinical Course as of Dec 14 2305  [facility] Dec 15, 2018  2302 Urinalysis reveals positive nitrates, moderate leukocytes and greater than 50 white blood cells, no bacteria were seen however given the patient's clinical symptoms we will treat for UTI.  Culture pending   [  BM]    Clinical Course User Index [BM] Noemi Chapel, MD       UIA and cultures - pt agreeable. Well appearing otherwise Last BM was at 4:30 AM  Final Clinical Impressions(s) / ED Diagnoses   Final diagnoses:  Cystitis    ED Discharge Orders         Ordered    ciprofloxacin (CIPRO) 500 MG tablet  2 times daily     12/15/18 2306            Noemi Chapel, MD 12/15/18 2307

## 2018-12-15 NOTE — ED Notes (Signed)
Pt still unable to give urine at this time.

## 2018-12-16 NOTE — Telephone Encounter (Signed)
Discussed with pt. Pt verbalized understanding.  °

## 2018-12-17 ENCOUNTER — Telehealth: Payer: Self-pay | Admitting: Family Medicine

## 2018-12-17 ENCOUNTER — Other Ambulatory Visit: Payer: Self-pay | Admitting: *Deleted

## 2018-12-17 LAB — URINE CULTURE: Culture: NO GROWTH

## 2018-12-17 MED ORDER — DIPHENOXYLATE-ATROPINE 2.5-0.025 MG PO TABS: ORAL_TABLET | ORAL | 0 refills | Status: DC

## 2018-12-17 NOTE — Telephone Encounter (Signed)
Patient was prescribe diphenoxylate 2.5 and told to take until 11/17,she will run out on 11/14 will need refill. [place] Please Advise

## 2018-12-17 NOTE — Telephone Encounter (Signed)
Script fax. Await signature then will fax and call pt

## 2018-12-17 NOTE — Telephone Encounter (Signed)
Sure ref

## 2018-12-18 ENCOUNTER — Telehealth: Payer: Self-pay | Admitting: Family Medicine

## 2018-12-18 NOTE — Telephone Encounter (Signed)
Pt would like to discuss medications and some changes.   She is on her second week of bowel problems and has a UTI and she is on medications for both.   She would like to know the report of urine culture and discuss change in bowels with a nurse.

## 2018-12-18 NOTE — Telephone Encounter (Signed)
Prescription was faxed to pharmacy 12/17/18 and patient was notified.

## 2018-12-18 NOTE — Telephone Encounter (Signed)
Patient has been having uncontrolled diarrhea for 2 weeks. Patient was prescribed lomotil- stools tests all different negative. Sees GI dr 12/24/18. Patient went to ER Monday for a UTI and given Cipro- saw urine culture on my chart and saw it was negative also. The lomotil has slowed the diarrhea down and she hasn't had issues in 2 days but does she keep taking the med lomotil ongoing to keep it in check and does she continue the cipro since the urine culture was normal?

## 2018-12-19 NOTE — Telephone Encounter (Signed)
Stop cipro, try to back off to one lomotil per d , if that does not work, go back to two per day

## 2018-12-19 NOTE — Telephone Encounter (Signed)
Discussed with pt and she verbalized understanding.  

## 2018-12-24 ENCOUNTER — Encounter: Payer: Self-pay | Admitting: Nurse Practitioner

## 2018-12-24 ENCOUNTER — Ambulatory Visit: Payer: Medicare Other | Admitting: Nurse Practitioner

## 2018-12-24 VITALS — BP 170/80 | HR 60 | Temp 97.4°F | Ht 61.0 in | Wt 113.0 lb

## 2018-12-24 DIAGNOSIS — Z1159 Encounter for screening for other viral diseases: Secondary | ICD-10-CM

## 2018-12-24 DIAGNOSIS — K589 Irritable bowel syndrome without diarrhea: Secondary | ICD-10-CM

## 2018-12-24 DIAGNOSIS — R197 Diarrhea, unspecified: Secondary | ICD-10-CM | POA: Diagnosis not present

## 2018-12-24 MED ORDER — NA SULFATE-K SULFATE-MG SULF 17.5-3.13-1.6 GM/177ML PO SOLN: ORAL | 0 refills | Status: DC

## 2018-12-24 NOTE — Patient Instructions (Signed)
If you are age 79 or older, your body mass index should be between 23-30. Your Body mass index is 21.35 kg/m. If this is out of the aforementioned range listed, please consider follow up with your Primary Care Provider.  If you are age 31 or younger, your body mass index should be between 19-25. Your Body mass index is 21.35 kg/m. If this is out of the aformentioned range listed, please consider follow up with your Primary Care Provider.   You have been scheduled for a colonoscopy. Please follow written instructions given to you at your visit today.  Please pick up your prep supplies at the pharmacy within the next 1-3 days. If you use inhalers (even only as needed), please bring them with you on the day of your procedure. Your physician has requested that you go to www.startemmi.com and enter the access code given to you at your visit today. This web site gives a general overview about your procedure. However, you should still follow specific instructions given to you by our office regarding your preparation for the procedure.  We have sent the following medications to your pharmacy for you to pick up at your convenience: Suprep  Thank you for choosing me and [facility].   Tye Savoy, NP

## 2018-12-24 NOTE — Progress Notes (Addendum)
Chief Complaint:    diarrhea  IMPRESSION and PLAN:    78 year old female with history of IBS-D.  Here with several weeks of diarrhea awakening her from sleep in the early morning hours.  Stool studies negative. Despite Lomotil she is still having diarrhea 2 to 3 days out of the week. Patient is concerned, inquires about a colonoscopy for colon cancer screening.   --Screening colonoscopy is not appropriate in this case.  This would be a diagnostic colonoscopy for evaluation of diarrhea, I explained this to the patient.  Given the persistent diarrhea it is not unreasonable to proceed.  Patient certainly looks and feels well enough for the procedure --The risks and benefits of colonoscopy with possible polypectomy / biopsies were discussed and the patient agrees to proceed.  --If colonoscopy negative, consider treatment for SIBO given the complaints of associated intestinal gas / belching  HPI:     Patient is a 78 yo female with HTN, diverticulosis, and IBS. She is known to Dr. Havery Moros. For years her IBS related diarrhea was well controlled with Metamucil, flaxseed and probiotics.  On ten thirty-one patient woke up at 5 AM continent of loose stool.  She had associated mild lower abdominal discomfort.  She has continued to have urgent loose stool multiple times a day.  Diarrhea is not necessarily postprandial.  It generally starts around 5 AM.  C. difficile, stool culture negative. Cannot correlate diarrhea to any medication or diet changes.  SARS negative.  Lomotil helps.  She started taking Lomotil three times a day then decreased it to twice a day was able to have formed stool.  After discontinuation of the Lomotil she get recurrent diarrhea.  Now taking Lomotil twice daily again butt still having diarrhea 2 to 3 days out of the week.  She is eating a bland diet.  In addition to above patient complains of excessive belching.  She gets sensation of something pressing on her diaphragm  radiating through to her back.  These episodes happen infrequently, they are transient and have no relationship to eating.   Last colonoscopy was October 2014.  A diminutive flat sigmoid polyp was removed.  She had diverticulosis and hemorrhoids.  Random colon biopsies were negative and polyp path was normal mucosa  Review of systems:     No chest pain, no SOB, no fevers, no urinary sx   Past Medical History:  Diagnosis Date  . Arthritis   . Diverticulosis of colon   . Essential hypertension   . History of basal cell carcinoma (BCC) excision    02-10-2013  left cheek  s/p  moh's sx  . History of palpitations   . History of recurrent UTIs   . History of syncope    2015-- felt to vasovagal response to pain (per cardiologist note)  . IBS (irritable bowel syndrome)   . Open wound of left knee    warm soaks daily /  neosprin and dressing daily / per per still has drainage  . Patellar bursitis of left knee    pre-patella septic bursitis w/ foreign body  . Right thyroid nodule    x2 right side incidental finding on carotid duplex 03/ 2018-- thyroid ultrasound done , benign , follow-up in one year  . Wears hearing aid in both ears     Patient's surgical history, family medical history, social history, medications and allergies were all reviewed in Epic     Current Outpatient Medications  Medication Sig Dispense  Refill  . diphenoxylate-atropine (LOMOTIL) 2.5-0.025 MG tablet 1 tablet 3 times daily as needed diarrhea 30 tablet 0  . Estradiol (IMVEXXY MAINTENANCE PACK) 4 MCG INST Place 4 mcg vaginally daily as needed. Use every 3 -4 days (Patient taking differently: Place 4 mcg vaginally 2 (two) times a week. Wednesdays and Saturdays) 24 each 1  . Flaxseed, Linseed, (FLAX PO) Take 1 Dose by mouth every morning.     Marland Kitchen losartan (COZAAR) 50 MG tablet Take 1 tablet (50 mg total) by mouth every morning. 90 tablet 1  . metoprolol tartrate (LOPRESSOR) 50 MG tablet TAKE (1) TABLET TWICE DAILY.  (Patient taking differently: Take 50 mg by mouth 2 (two) times daily. ) 180 tablet 1  . Multiple Vitamins-Minerals (CENTRUM SILVER) tablet Take 1 tablet by mouth daily.    . OMEGA 3 1000 MG CAPS Take 1,000 mg by mouth at bedtime.     . Probiotic Product (PROBIOTIC DAILY PO) Take 1 tablet by mouth daily.    . Psyllium (METAMUCIL PO) Take 1 Scoop by mouth every evening.     . sodium chloride (OCEAN) 0.65 % SOLN nasal spray Place 1 spray into both nostrils daily as needed for congestion.    . phenazopyridine (AZO-STANDARD) 95 MG tablet Take 95-190 mg by mouth once as needed for pain.     No current facility-administered medications for this visit.     Physical Exam:     BP (!) 170/80   Pulse 60   Temp (!) 97.4 F (36.3 C)   Ht 5\' 1"  (1.549 m)   Wt 113 lb (51.3 kg)   LMP  (LMP Unknown)   BMI 21.35 kg/m   GENERAL:  Pleasant female in NAD PSYCH: : Cooperative, normal affect EENT:  conjunctiva pink, mucous membranes moist, neck supple without masses CARDIAC:  RRR,  no peripheral edema PULM: Normal respiratory effort, lungs CTA bilaterally, no wheezing ABDOMEN:  Nondistended, soft, nontender. No obvious masses, no hepatomegaly,  normal bowel sounds SKIN:  turgor, no lesions seen Musculoskeletal:  Normal muscle tone, normal strength NEURO: Alert and oriented x 3, no focal neurologic deficits   Tye Savoy , NP 12/24/2018, 9:47 AM

## 2018-12-26 ENCOUNTER — Encounter: Payer: Self-pay | Admitting: Nurse Practitioner

## 2018-12-26 NOTE — Progress Notes (Signed)
Agree with assessment and plan as outlined.  

## 2018-12-29 ENCOUNTER — Telehealth: Payer: Self-pay | Admitting: Nurse Practitioner

## 2018-12-29 ENCOUNTER — Encounter: Payer: Self-pay | Admitting: Gastroenterology

## 2018-12-29 NOTE — Telephone Encounter (Signed)
Error

## 2018-12-30 ENCOUNTER — Other Ambulatory Visit: Payer: Self-pay

## 2018-12-30 MED ORDER — XIFAXAN 550 MG PO TABS: 550.0000 mg | ORAL_TABLET | Freq: Three times a day (TID) | ORAL | 0 refills | Status: DC

## 2018-12-30 NOTE — Telephone Encounter (Signed)
Given she has IBS-D, I would recommend if she wants to try something else first we could try her on Rifaximin 550mg  TID for 2 weeks, which would also treat for SIBO if she had it. If that fails to work then would proceed with colonoscopy. Whichever her preference

## 2018-12-30 NOTE — Telephone Encounter (Signed)
Patient is advised of this information. She will stop the probiotics while on Xifaxan. She will consider Citrucel but at this time she feels bloated the most with certain foods.

## 2018-12-30 NOTE — Telephone Encounter (Signed)
Patient chooses to try the Rifaximin which I will transmit to her pharmacy. She asks additional questions. Can she continue with Metamucil and flaxseed daily? Can she continue taking her probiotics?

## 2018-12-30 NOTE — Telephone Encounter (Signed)
Good question. I would stop all probiotics while she is on rifaximin. If she is having bloating I would also stop the metamucil and flaxseed. Could try Citrucel which may be better tolerated if she still wants to take fiber. Thanks

## 2018-12-30 NOTE — Telephone Encounter (Signed)
Patient seen by Tye Savoy, NP for IBS with diarrhea. The patient had initially inquired about a screening colonoscopy. See the office note for visit details.  The patient would like to do the SIBO test  instead of the colonoscopy. She has researched this subject. She feels her symptoms are more suggestive of SIBO. Okay to proceed?

## 2018-12-31 ENCOUNTER — Other Ambulatory Visit: Payer: Self-pay

## 2018-12-31 MED ORDER — XIFAXAN 550 MG PO TABS: 550.0000 mg | ORAL_TABLET | Freq: Three times a day (TID) | ORAL | 0 refills | Status: AC

## 2019-01-05 ENCOUNTER — Telehealth: Payer: Self-pay

## 2019-01-05 ENCOUNTER — Telehealth: Payer: Self-pay | Admitting: Gastroenterology

## 2019-01-05 NOTE — Telephone Encounter (Signed)
Prior Auth for Xifaxan 550mg  TID for 14 days approved through [facility]. [employer] reference number: [id number]  Approval through 01-19-19 under Medicare Part D benefit. Phone: [phone]

## 2019-01-06 DIAGNOSIS — S76311A Strain of muscle, fascia and tendon of the posterior muscle group at thigh level, right thigh, initial encounter: Secondary | ICD-10-CM | POA: Diagnosis not present

## 2019-01-06 NOTE — Telephone Encounter (Signed)
Thanks [employer]. If symptoms have resolved, then would hold off on colonoscopy (her last exam was 2014) and she can follow up as needed

## 2019-01-06 NOTE — Telephone Encounter (Signed)
Patient's symptoms of diarrhea seem to have gone away. She has had normal bowel movements for 4 days. She is choosing to hold off on Xifaxan for now. [facility] will put the prescription on hold. The patient will contact us in a week with an update of her symptoms or continued improvement.  Does she need a screening colonoscopy?

## 2019-01-08 ENCOUNTER — Encounter: Payer: Self-pay | Admitting: Gastroenterology

## 2019-01-09 NOTE — Telephone Encounter (Signed)
Spoke with the patient. She feels she is back to her normal baseline. She asks to give her husband her appointments. He is due his colonoscopy and he is a patient of Dr Havery Moros.

## 2019-01-20 ENCOUNTER — Encounter: Payer: Medicare Other | Admitting: Gastroenterology

## 2019-01-21 ENCOUNTER — Ambulatory Visit (HOSPITAL_COMMUNITY): Payer: Medicare Other | Admitting: Physical Therapy

## 2019-01-21 ENCOUNTER — Encounter (HOSPITAL_COMMUNITY): Payer: Self-pay | Admitting: Physical Therapy

## 2019-01-21 ENCOUNTER — Encounter (HOSPITAL_COMMUNITY): Payer: Self-pay

## 2019-01-21 ENCOUNTER — Other Ambulatory Visit: Payer: Self-pay

## 2019-01-21 ENCOUNTER — Ambulatory Visit (HOSPITAL_COMMUNITY): Payer: Medicare Other | Attending: Orthopedic Surgery | Admitting: Physical Therapy

## 2019-01-21 DIAGNOSIS — M79604 Pain in right leg: Secondary | ICD-10-CM | POA: Diagnosis not present

## 2019-01-21 DIAGNOSIS — M25661 Stiffness of right knee, not elsewhere classified: Secondary | ICD-10-CM | POA: Insufficient documentation

## 2019-01-21 DIAGNOSIS — M6281 Muscle weakness (generalized): Secondary | ICD-10-CM | POA: Diagnosis not present

## 2019-01-21 DIAGNOSIS — R262 Difficulty in walking, not elsewhere classified: Secondary | ICD-10-CM | POA: Diagnosis not present

## 2019-01-21 NOTE — Therapy (Signed)
[name], [place], [address] Phone: [phone]   Fax:  [phone]  Physical Therapy Evaluation  Patient Details  Name: [name] MRN: [id number] Date of Birth: 06/30/1940 Referring Provider (PT): Rod Can, MD   Encounter Date: 01/21/2019  PT End of Session - 01/21/19 1111    Visit Number  1    Number of Visits  8    Date for PT Re-Evaluation  02/18/19    Authorization Type  [employer] Medicare (No visit limit)    Authorization Time Period  01/21/19 - 02/18/19 (Progress note due on/by 10th visit)    Authorization - Visit Number  1    Authorization - Number of Visits  10    PT Start Time  [phone]    PT Stop Time  1050    PT Time Calculation (min)  59 min    Activity Tolerance  Patient tolerated treatment well;No increased pain    Behavior During Therapy  WFL for tasks assessed/performed       Past Medical History:  Diagnosis Date  . Arthritis   . Diverticulosis of colon   . Essential hypertension   . History of basal cell carcinoma (BCC) excision    02-10-2013  left cheek  s/p  moh's sx  . History of palpitations   . History of recurrent UTIs   . History of syncope    2015-- felt to vasovagal response to pain (per cardiologist note)  . IBS (irritable bowel syndrome)   . Open wound of left knee    warm soaks daily /  neosprin and dressing daily / per per still has drainage  . Patellar bursitis of left knee    pre-patella septic bursitis w/ foreign body  . Right thyroid nodule    x2 right side incidental finding on carotid duplex 03/ 2018-- thyroid ultrasound done , benign , follow-up in one year  . Wears hearing aid in both ears     Past Surgical History:  Procedure Laterality Date  . APPENDECTOMY  age 20  . BREAST BIOPSY  1962   benign  . CATARACT EXTRACTION W/PHACO  06/25/2011   Procedure: CATARACT EXTRACTION PHACO AND INTRAOCULAR LENS PLACEMENT (IOC);  Surgeon: Williams Che, MD;   Location: [facility];  Service: Ophthalmology;  Laterality: Right;  CDE: 8.90  . CATARACT EXTRACTION W/PHACO Left 05/05/2012   Procedure: CATARACT EXTRACTION PHACO AND INTRAOCULAR LENS PLACEMENT (IOC);  Surgeon: Williams Che, MD;  Location: [facility];  Service: Ophthalmology;  Laterality: Left;  CDE 12.78  . COLONOSCOPY  last one 11-26-2012  . EXCISION MORTON'S NEUROMA Right 2003  approx.  . INCISION AND DRAINAGE WOUND WITH FOREIGN BODY REMOVAL Left 08/30/2016   Procedure: LEFT KNEE INCISION AND DRAINAGE WOUND WITH FOREIGN BODY REMOVAL;  Surgeon: Sydnee Cabal, MD;  Location: [facility];  Service: Orthopedics;  Laterality: Left;  . KNEE ARTHROPLASTY Right 03/14/2017   Procedure: RIGHT TOTAL KNEE ARTHROPLASTY WITH COMPUTER NAVIGATION AND REMOVAL OF HARDWARE;  Surgeon: Rod Can, MD;  Location: [facility];  Service: Orthopedics;  Laterality: Right;  Adductor Block  . KNEE ARTHROSCOPY W/ ACL RECONSTRUCTION Right 1996  . KNEE CARTILAGE SURGERY Right age 51  . MOHS SURGERY  02/10/2013   left cheek -- BCC  . POSTERIOR REPAIR  05-21-2002   dr Glo Herring   symptomatic recetocele  . TONSILLECTOMY AND ADENOIDECTOMY  age 63  . TRANSTHORACIC ECHOCARDIOGRAM  04-17-2016  dr Domenic Polite   ef  60-65%/  trivial MR/ mild TR  . VAGINAL HYSTERECTOMY  1979   w/  Bilateral Salpingoophorectomy    There were no vitals filed for this visit.   Subjective Assessment - 01/21/19 1004    Subjective  Patient reported having a lot of pain on the right side in the lower back and that it radiates sometimes to her glute and at times down toward her heel. Patient reported doing a lot of stretching and yoga. She reported that the pain is worse in the morning when she first wakes up. She reported a history of having a right knee replacement. Patient reported pain when lying on right side or if rolling that direction. She stated the frequency of the pain is every morning and that at times it can reach a 10/10. Patient  reported always sleeping on her back. She denied any changes to bowel and bladder. Did report some tingling at times, but not numbness.    Pertinent History  Hx of RT Knee TKA, IBS    Limitations  House hold activities;Standing    How long can you sit comfortably?  Not limited.    How long can you stand comfortably?  Several hours    How long can you walk comfortably?  Several hours    Diagnostic tests  MRI results brought from patient from 1999 showing degenerative changes    Patient Stated Goals  Find exercises to help alleviate the pain    Currently in Pain?  No/denies         [facility] PT Assessment - 01/21/19 0001      Assessment   Medical Diagnosis  Strain of muscle, fascia and tendon on posterior muscle group of Right thigh    Referring Provider (PT)  Rod Can, MD    Onset Date/Surgical Date  --   2 months   Next MD Visit  Unknown    Prior Therapy  Yes, for right knee      Precautions   Precautions  None      Restrictions   Weight Bearing Restrictions  No      Balance Screen   Has the patient fallen in the past 6 months  No    Has the patient had a decrease in activity level because of a fear of falling?   No    Is the patient reluctant to leave their home because of a fear of falling?   No      Home Environment   Living Environment  Private residence    Living Arrangements  Spouse/significant other    Type of [place] Access  Level entry    [place]  One level      Prior Function   Level of Independence  Independent    Vocation  Retired    Leisure  Yoga, hiking, bike, Transport planner   Overall Cognitive Status  Within Functional Limits for tasks assessed      Observation/Other Assessments   Focus on Therapeutic Outcomes (FOTO)   9% limited      Sensation   Light Touch  Appears Intact      ROM / Strength   AROM / PROM / Strength  AROM;Strength;PROM      AROM   Overall AROM Comments  All lumbar WFL, slight pull but not pain     AROM Assessment Site  Lumbar      PROM   Overall PROM Comments  Hip IR/ER PROM WFL no pain    PROM Assessment Site  Hip    Right/Left Hip  Right    Right Hip Extension  --   Minimally limited with AROM     Strength   Strength Assessment Site  Hip;Knee;Ankle    Right/Left Hip  Right;Left    Right Hip Flexion  4+/5    Right Hip Extension  4+/5   Pain, decreased ROM   Right Hip ABduction  5/5    Left Hip Flexion  4+/5    Left Hip Extension  5/5    Left Hip ABduction  5/5    Right/Left Knee  Right;Left    Right Knee Flexion  5/5    Right Knee Extension  5/5   Painful in heel   Left Knee Flexion  5/5    Left Knee Extension  5/5    Right/Left Ankle  Right;Left    Right Ankle Dorsiflexion  5/5    Left Ankle Dorsiflexion  5/5      Palpation   Spinal mobility  General hypomobility with CPA assessment of thoracic and lumbar spine    SI assessment   No pain with sacral thrust    Palpation comment  Pain with palpation of right PSIS, tender in gluteals laterally on RT side, denied pain with palpation of greater trochanter of the right side      Special Tests    Special Tests  Hip Special Tests;Lumbar    Lumbar Tests  Straight Leg Raise    Hip Special Tests   Hip Scouring      Straight Leg Raise   Findings  Positive    Side   Right    Comment  Pain not elicited with isolated ankle DF with right LE straight on mat      Hip Scouring   Findings  Negative    Comments  --   Bilaterally               Objective measurements completed on examination: See above findings.              PT Education - 01/21/19 1110    Education Details  Discussed examination findings, POC, and initial HEP.    Person(s) Educated  Patient    Methods  Explanation;Handout    Comprehension  Verbalized understanding       PT Short Term Goals - 01/21/19 1113      PT SHORT TERM GOAL #1   Title  Pt will be independent with HEP and perform consistently in order to maximize retrun to  PLOF.    Time  2    Period  Weeks    Status  New    Target Date  02/04/19        PT Long Term Goals - 01/21/19 1113      PT LONG TERM GOAL #1   Title  Patient will report a decrease in the frequency of her pain from daily to no more than 3 times a week.    Time  4    Period  Weeks    Status  New    Target Date  02/18/19      PT LONG TERM GOAL #2   Title  Patient will report that her maximum pain has not exceeded a 2/10 over the course of a 1 week period indicating improved tolerance to daily activities.    Time  4    Period  Weeks  Status  New    Target Date  02/18/19      PT LONG TERM GOAL #3   Title  Patient will demonstrate improvement of at least 1/2 MMT strength grade in all deficient muscle groups and deny pain with testing indicating improved strength for functional mobility.    Time  4    Period  Weeks    Status  New    Target Date  02/18/19      PT LONG TERM GOAL #4   Title  Patient will report that her pain has not extended past her knee over the course of a 1 week period indicating improved centralization of pain.    Time  4    Period  Weeks    Status  New    Target Date  02/18/19             Plan - 01/21/19 1153    Clinical Impression Statement  Patient is a 78 year old female who presented to outpatient physical therapy with complaint of right leg pain which radiates toward the heel of her right foot. Upon examination, noted some decreased hip strength and pain with muscle testing on the right side. CPAs of the spine did not elicit pain, however patient noted increased tenderness to palpation of lateral right gluteal and right PSIS. Patient had a positive SLR test, but did not have an increase in pain with ankle dorsiflexion. Patient's frequency of symptoms is daily and occurs primarily in the morning which it can be very severe, but reported pain improves typically through the day. Educated patient on initial HEP with sciatic nerve glide with ankle  dorsiflexion. Patient would benefit from continued skilled physical therapy to reduce patient's overall pain and improve mobility.    Personal Factors and Comorbidities  Age;Comorbidity 3+    Comorbidities  HTN, S/P Right TKA, osteopenia, IBS    Examination-Activity Limitations  Stand;Locomotion Level    Examination-Participation Restrictions  Yard Work;Community Activity    Stability/Clinical Decision Making  Stable/Uncomplicated    Clinical Decision Making  Low    Rehab Potential  Good    PT Frequency  2x / week    PT Duration  4 weeks    PT Treatment/Interventions  ADLs/Self Care Home Management;Electrical Stimulation;Moist Heat;Cryotherapy;Traction;Ultrasound;DME Instruction;Gait training;Stair training;Functional mobility training;Therapeutic activities;Therapeutic exercise;Balance training;Neuromuscular re-education;Patient/family education;Orthotic Fit/Training;Manual techniques;Passive range of motion;Dry needling;Energy conservation;Taping    PT Next Visit Plan  Review initial HEP,  palpate right ischial tuberosity to assess for pain, trial hip isometrics abduction/adduction, perform STM along path of sciatic nerve right lower extremity   PT Home Exercise Plan  01/21/19: Sciatic nerve glide supine pillows under leg with ankle movement x 10 daily    Consulted and Agree with Plan of Care  Patient       Patient will benefit from skilled therapeutic intervention in order to improve the following deficits and impairments:  Pain, Decreased mobility, Decreased activity tolerance, Decreased endurance, Decreased strength, Hypomobility  Visit Diagnosis: Pain in right leg  Muscle weakness (generalized)     Problem List Patient Active Problem List   Diagnosis Date Noted  . Post-traumatic osteoarthritis of right knee 03/14/2017  . S/P knee surgery 08/30/2016  . History of recurrent UTIs 09/19/2015  . Osteopenia 05/21/2015  . Osteoarthritis of right hip 05/21/2015  . Arrhythmia, long  term 02/15/2014  . Esophageal reflux 02/15/2014  . Chronic back pain 06/01/2013  . Change in bowel habits 11/21/2012  . Diarrhea 11/21/2012   Clarene Critchley  PT, DPT 12:01 PM, 01/21/19 [place], [place], [address] Phone: [phone]   Fax:  [phone]  Name: ORTENCIA YON MRN: [id number] Date of Birth: [birth year]

## 2019-01-21 NOTE — Patient Instructions (Signed)
Access Code: [facility]  URL: [email or web address]  Date: 01/21/2019  Prepared by: Clarene Critchley   Exercises Supine Sciatic Nerve Mobilization With Leg on Pillow - 10 reps - 1 sets - 3 seconds hold - 1x daily - 7x weekly

## 2019-01-22 ENCOUNTER — Encounter: Payer: Medicare Other | Admitting: Gastroenterology

## 2019-01-22 ENCOUNTER — Ambulatory Visit (HOSPITAL_COMMUNITY): Payer: Medicare Other | Admitting: Physical Therapy

## 2019-01-22 DIAGNOSIS — M25661 Stiffness of right knee, not elsewhere classified: Secondary | ICD-10-CM

## 2019-01-22 DIAGNOSIS — M6281 Muscle weakness (generalized): Secondary | ICD-10-CM | POA: Diagnosis not present

## 2019-01-22 DIAGNOSIS — R262 Difficulty in walking, not elsewhere classified: Secondary | ICD-10-CM | POA: Diagnosis not present

## 2019-01-22 DIAGNOSIS — M79604 Pain in right leg: Secondary | ICD-10-CM

## 2019-01-22 NOTE — Therapy (Signed)
[place], [place], [address] Phone: [phone]   Fax:  [phone]  Physical Therapy Treatment  Patient Details  Name: [name] MRN: [id number] Date of Birth: 06-22-1940 Referring Provider (PT): Rod Can, MD   Encounter Date: 01/22/2019  PT End of Session - 01/22/19 1341    Visit Number  2    Number of Visits  8    Date for PT Re-Evaluation  02/18/19    Authorization Type  [employer] Medicare (No visit limit)    Authorization Time Period  01/21/19 - 02/18/19 (Progress note due on/by 10th visit)    Authorization - Visit Number  2    Authorization - Number of Visits  10    PT Start Time  1050    PT Stop Time  1135    PT Time Calculation (min)  45 min    Activity Tolerance  Patient tolerated treatment well;No increased pain    Behavior During Therapy  WFL for tasks assessed/performed       Past Medical History:  Diagnosis Date  . Arthritis   . Diverticulosis of colon   . Essential hypertension   . History of basal cell carcinoma (BCC) excision    02-10-2013  left cheek  s/p  moh's sx  . History of palpitations   . History of recurrent UTIs   . History of syncope    2015-- felt to vasovagal response to pain (per cardiologist note)  . IBS (irritable bowel syndrome)   . Open wound of left knee    warm soaks daily /  neosprin and dressing daily / per per still has drainage  . Patellar bursitis of left knee    pre-patella septic bursitis w/ foreign body  . Right thyroid nodule    x2 right side incidental finding on carotid duplex 03/ 2018-- thyroid ultrasound done , benign , follow-up in one year  . Wears hearing aid in both ears     Past Surgical History:  Procedure Laterality Date  . APPENDECTOMY  age 70  . BREAST BIOPSY  1962   benign  . CATARACT EXTRACTION W/PHACO  06/25/2011   Procedure: CATARACT EXTRACTION PHACO AND INTRAOCULAR LENS PLACEMENT (IOC);  Surgeon: Williams Che, MD;   Location: [facility];  Service: Ophthalmology;  Laterality: Right;  CDE: 8.90  . CATARACT EXTRACTION W/PHACO Left 05/05/2012   Procedure: CATARACT EXTRACTION PHACO AND INTRAOCULAR LENS PLACEMENT (IOC);  Surgeon: Williams Che, MD;  Location: [facility];  Service: Ophthalmology;  Laterality: Left;  CDE 12.78  . COLONOSCOPY  last one 11-26-2012  . EXCISION MORTON'S NEUROMA Right 2003  approx.  . INCISION AND DRAINAGE WOUND WITH FOREIGN BODY REMOVAL Left 08/30/2016   Procedure: LEFT KNEE INCISION AND DRAINAGE WOUND WITH FOREIGN BODY REMOVAL;  Surgeon: Sydnee Cabal, MD;  Location: [facility];  Service: Orthopedics;  Laterality: Left;  . KNEE ARTHROPLASTY Right 03/14/2017   Procedure: RIGHT TOTAL KNEE ARTHROPLASTY WITH COMPUTER NAVIGATION AND REMOVAL OF HARDWARE;  Surgeon: Rod Can, MD;  Location: [facility];  Service: Orthopedics;  Laterality: Right;  Adductor Block  . KNEE ARTHROSCOPY W/ ACL RECONSTRUCTION Right 1996  . KNEE CARTILAGE SURGERY Right age 33  . MOHS SURGERY  02/10/2013   left cheek -- BCC  . POSTERIOR REPAIR  05-21-2002   dr Glo Herring   symptomatic recetocele  . TONSILLECTOMY AND ADENOIDECTOMY  age 26  . TRANSTHORACIC ECHOCARDIOGRAM  04-17-2016  dr Domenic Polite   ef  60-65%/  trivial MR/ mild TR  . VAGINAL HYSTERECTOMY  1979   w/  Bilateral Salpingoophorectomy    There were no vitals filed for this visit.  Subjective Assessment - 01/22/19 1055    Subjective  pt states she had alot of pain this morning, as usual and is hard to get out of bed due to the pain.  States she will walk bent over until she can eventually straighten up.  States she is now okay without pain but 10/10 in morning.    Currently in Pain?  No/denies                       [facility] Adult PT Treatment/Exercise - 01/22/19 0001      Lumbar Exercises: Stretches   Active Hamstring Stretch  Right;2 reps;30 seconds    Active Hamstring Stretch Limitations  with nerve stretch    Piriformis  Stretch  2 reps;Right    Piriformis Stretch Limitations  supine and seated      Lumbar Exercises: Supine   Bridge  10 reps      Manual Therapy   Manual Therapy  Muscle Energy Technique;Soft tissue mobilization    Manual therapy comments  muscle energy completed at beginning of session, manual at end of session    Soft tissue mobilization  to Rt glute, piriformis mm in prone    Muscle Energy Technique  to correct Rt anterior rotation in supine.             PT Education - 01/22/19 1324    Education Details  Reveiwed HEP, goals and POC moving forward.  Educated on Muscle energy and how this corrects SI dysfunction    Person(s) Educated  Patient    Methods  Explanation    Comprehension  Verbalized understanding;Returned demonstration;Verbal cues required;Tactile cues required       PT Short Term Goals - 01/22/19 1125      PT SHORT TERM GOAL #1   Title  Pt will be independent with HEP and perform consistently in order to maximize retrun to PLOF.    Time  2    Period  Weeks    Status  On-going    Target Date  02/04/19        PT Long Term Goals - 01/22/19 1126      PT LONG TERM GOAL #1   Title  Patient will report a decrease in the frequency of her pain from daily to no more than 3 times a week.    Time  4    Period  Weeks    Status  On-going      PT LONG TERM GOAL #2   Title  Patient will report that her maximum pain has not exceeded a 2/10 over the course of a 1 week period indicating improved tolerance to daily activities.    Time  4    Period  Weeks    Status  On-going      PT LONG TERM GOAL #3   Title  Patient will demonstrate improvement of at least 1/2 MMT strength grade in all deficient muscle groups and deny pain with testing indicating improved strength for functional mobility.    Time  4    Period  Weeks    Status  On-going      PT LONG TERM GOAL #4   Title  Patient will report that her pain has not extended past her knee over the course of a 1  week  period indicating improved centralization of pain.    Time  4    Period  Weeks    Status  On-going            Plan - 01/22/19 1325    Clinical Impression Statement  Began session with check of SI alignment with noted Rt anterior rotation.  Muscled energy completed with successful rotation back into neutral.  Reveiwed goals, HEP and pOC moving forward.  Pt without any tenderness or pain with palpation of IT but noted discomfort in Rt piriformis and glute med region.  Manual completed with resultant ease in postiional discomfort following.  Instructed with vaious ways of completing piriformis stretch and nerve stretch and urged to add this to morning routine to help with pain when getting up.  Pt with noted tightness with soft tissue massage completed to Rt glute/piriformis at end of session.  Pt reported overall improvement at end of session.    Personal Factors and Comorbidities  Age;Comorbidity 3+    Comorbidities  HTN, S/P Right TKA, osteopenia, IBS    Examination-Activity Limitations  Stand;Locomotion Level    Examination-Participation Restrictions  Yard Work;Community Activity    Stability/Clinical Decision Making  Stable/Uncomplicated    Rehab Potential  Good    PT Frequency  2x / week    PT Duration  4 weeks    PT Treatment/Interventions  ADLs/Self Care Home Management;Electrical Stimulation;Moist Heat;Cryotherapy;Traction;Ultrasound;DME Instruction;Gait training;Stair training;Functional mobility training;Therapeutic activities;Therapeutic exercise;Balance training;Neuromuscular re-education;Patient/family education;Orthotic Fit/Training;Manual techniques;Passive range of motion;Dry needling;Energy conservation;Taping    PT Next Visit Plan  continue with checking of SI alignment, STM along path of sciatic nerve right lower extremity and appropriate exercises.  Begin vectors to improve glute med strength, march holds for hip flexors.    PT Home Exercise Plan  01/21/19: Sciatic nerve  glide supine pillows under leg with ankle movement x 10 daily    Consulted and Agree with Plan of Care  Patient       Patient will benefit from skilled therapeutic intervention in order to improve the following deficits and impairments:  Pain, Decreased mobility, Decreased activity tolerance, Decreased endurance, Decreased strength, Hypomobility  Visit Diagnosis: Pain in right leg  Stiffness of right knee, not elsewhere classified  Muscle weakness (generalized)     Problem List Patient Active Problem List   Diagnosis Date Noted  . Post-traumatic osteoarthritis of right knee 03/14/2017  . S/P knee surgery 08/30/2016  . History of recurrent UTIs 09/19/2015  . Osteopenia 05/21/2015  . Osteoarthritis of right hip 05/21/2015  . Arrhythmia, long term 02/15/2014  . Esophageal reflux 02/15/2014  . Chronic back pain 06/01/2013  . Change in bowel habits 11/21/2012  . Diarrhea 11/21/2012   Teena Irani, PTA/CLT [phone]   Teena Irani 01/22/2019, 1:42 PM  [facility] [address] [place], [place], [address] Phone: [phone]   Fax:  [phone]  Name: RICKIE WALLER MRN: [id number] Date of Birth: Nov 18, 1940

## 2019-01-23 ENCOUNTER — Other Ambulatory Visit: Payer: Self-pay | Admitting: Family Medicine

## 2019-01-27 ENCOUNTER — Other Ambulatory Visit: Payer: Self-pay

## 2019-01-27 ENCOUNTER — Ambulatory Visit (HOSPITAL_COMMUNITY): Payer: Medicare Other | Admitting: Physical Therapy

## 2019-01-27 ENCOUNTER — Encounter (HOSPITAL_COMMUNITY): Payer: Self-pay | Admitting: Physical Therapy

## 2019-01-27 DIAGNOSIS — M25661 Stiffness of right knee, not elsewhere classified: Secondary | ICD-10-CM | POA: Diagnosis not present

## 2019-01-27 DIAGNOSIS — M6281 Muscle weakness (generalized): Secondary | ICD-10-CM

## 2019-01-27 DIAGNOSIS — R262 Difficulty in walking, not elsewhere classified: Secondary | ICD-10-CM | POA: Diagnosis not present

## 2019-01-27 DIAGNOSIS — M79604 Pain in right leg: Secondary | ICD-10-CM

## 2019-01-27 NOTE — Therapy (Signed)
[place], [place], [address] Phone: [phone]   Fax:  [phone]  Physical Therapy Treatment  Patient Details  Name: [name] MRN: [id number] Date of Birth: Jul 30, 1940 Referring Provider (PT): Rod Can, MD   Encounter Date: 01/27/2019  PT End of Session - 01/27/19 1051    Visit Number  3    Number of Visits  8    Date for PT Re-Evaluation  02/18/19    Authorization Type  [employer] Medicare (No visit limit)    Authorization Time Period  01/21/19 - 02/18/19 (Progress note due on/by 10th visit)    Authorization - Visit Number  3    Authorization - Number of Visits  10    PT Start Time  1003    PT Stop Time  1050    PT Time Calculation (min)  47 min    Activity Tolerance  Patient tolerated treatment well;No increased pain    Behavior During Therapy  WFL for tasks assessed/performed       Past Medical History:  Diagnosis Date  . Arthritis   . Diverticulosis of colon   . Essential hypertension   . History of basal cell carcinoma (BCC) excision    02-10-2013  left cheek  s/p  moh's sx  . History of palpitations   . History of recurrent UTIs   . History of syncope    2015-- felt to vasovagal response to pain (per cardiologist note)  . IBS (irritable bowel syndrome)   . Open wound of left knee    warm soaks daily /  neosprin and dressing daily / per per still has drainage  . Patellar bursitis of left knee    pre-patella septic bursitis w/ foreign body  . Right thyroid nodule    x2 right side incidental finding on carotid duplex 03/ 2018-- thyroid ultrasound done , benign , follow-up in one year  . Wears hearing aid in both ears     Past Surgical History:  Procedure Laterality Date  . APPENDECTOMY  age 35  . BREAST BIOPSY  1962   benign  . CATARACT EXTRACTION W/PHACO  06/25/2011   Procedure: CATARACT EXTRACTION PHACO AND INTRAOCULAR LENS PLACEMENT (IOC);  Surgeon: Williams Che, MD;   Location: [facility];  Service: Ophthalmology;  Laterality: Right;  CDE: 8.90  . CATARACT EXTRACTION W/PHACO Left 05/05/2012   Procedure: CATARACT EXTRACTION PHACO AND INTRAOCULAR LENS PLACEMENT (IOC);  Surgeon: Williams Che, MD;  Location: [facility];  Service: Ophthalmology;  Laterality: Left;  CDE 12.78  . COLONOSCOPY  last one 11-26-2012  . EXCISION MORTON'S NEUROMA Right 2003  approx.  . INCISION AND DRAINAGE WOUND WITH FOREIGN BODY REMOVAL Left 08/30/2016   Procedure: LEFT KNEE INCISION AND DRAINAGE WOUND WITH FOREIGN BODY REMOVAL;  Surgeon: Sydnee Cabal, MD;  Location: [facility];  Service: Orthopedics;  Laterality: Left;  . KNEE ARTHROPLASTY Right 03/14/2017   Procedure: RIGHT TOTAL KNEE ARTHROPLASTY WITH COMPUTER NAVIGATION AND REMOVAL OF HARDWARE;  Surgeon: Rod Can, MD;  Location: [facility];  Service: Orthopedics;  Laterality: Right;  Adductor Block  . KNEE ARTHROSCOPY W/ ACL RECONSTRUCTION Right 1996  . KNEE CARTILAGE SURGERY Right age 66  . MOHS SURGERY  02/10/2013   left cheek -- BCC  . POSTERIOR REPAIR  05-21-2002   dr Glo Herring   symptomatic recetocele  . TONSILLECTOMY AND ADENOIDECTOMY  age 64  . TRANSTHORACIC ECHOCARDIOGRAM  04-17-2016  dr Domenic Polite   ef  60-65%/  trivial MR/ mild TR  . VAGINAL HYSTERECTOMY  1979   w/  Bilateral Salpingoophorectomy    There were no vitals filed for this visit.  Subjective Assessment - 01/27/19 1010    Subjective  Pt states once she gets going she is pretty good but the morning is pretty painful.  She does yoga once a week    Pertinent History  Hx of RT Knee TKA, IBS    Limitations  House hold activities;Standing    How long can you sit comfortably?  Not limited.    How long can you stand comfortably?  Several hours    How long can you walk comfortably?  Several hours    Diagnostic tests  MRI results brought from patient from 1999 showing degenerative changes    Patient Stated Goals  Find exercises to help alleviate the  pain    Currently in Pain?  No/denies           [facility] Adult PT Treatment/Exercise - 01/27/19 0001      Exercises   Exercises  Lumbar      Lumbar Exercises: Stretches   Single Knee to Chest Stretch  Right;3 reps;30 seconds    Piriformis Stretch  2 reps;Right    Piriformis Stretch Limitations  quadriped       Lumbar Exercises: Supine   Single Leg Bridge  10 reps   Lt      Lumbar Exercises: Quadruped   Opposite Arm/Leg Raise Limitations  10      Manual Therapy   Manual Therapy  Muscle Energy Technique;Soft tissue mobilization    Manual therapy comments  muscle energy completed at beginning of session, manual at end of session    Soft tissue mobilization  to Rt glute, piriformis mm in prone    Muscle Energy Technique  to correct Rt anterior rotation in supine.               PT Short Term Goals - 01/22/19 1125      PT SHORT TERM GOAL #1   Title  Pt will be independent with HEP and perform consistently in order to maximize retrun to PLOF.    Time  2    Period  Weeks    Status  On-going    Target Date  02/04/19        PT Long Term Goals - 01/22/19 1126      PT LONG TERM GOAL #1   Title  Patient will report a decrease in the frequency of her pain from daily to no more than 3 times a week.    Time  4    Period  Weeks    Status  On-going      PT LONG TERM GOAL #2   Title  Patient will report that her maximum pain has not exceeded a 2/10 over the course of a 1 week period indicating improved tolerance to daily activities.    Time  4    Period  Weeks    Status  On-going      PT LONG TERM GOAL #3   Title  Patient will demonstrate improvement of at least 1/2 MMT strength grade in all deficient muscle groups and deny pain with testing indicating improved strength for functional mobility.    Time  4    Period  Weeks    Status  On-going      PT LONG TERM GOAL #4   Title  Patient will report that her  pain has not extended past her knee over the course of a 1  week period indicating improved centralization of pain.    Time  4    Period  Weeks    Status  On-going            Plan - 01/27/19 1053    Clinical Impression Statement  Pt SI checked with noted dysfunction.  Pt responded well with mm energy techniques.  PT has good flexiblity, however needs verbal and manual cuing to keep pelvic area stabilized during higher level exercises.    Personal Factors and Comorbidities  Age;Comorbidity 3+    Comorbidities  HTN, S/P Right TKA, osteopenia, IBS    Examination-Activity Limitations  Stand;Locomotion Level    Examination-Participation Restrictions  Yard Work;Community Activity    Stability/Clinical Decision Making  Stable/Uncomplicated    Rehab Potential  Good    PT Frequency  2x / week    PT Duration  4 weeks    PT Treatment/Interventions  ADLs/Self Care Home Management;Electrical Stimulation;Moist Heat;Cryotherapy;Traction;Ultrasound;DME Instruction;Gait training;Stair training;Functional mobility training;Therapeutic activities;Therapeutic exercise;Balance training;Neuromuscular re-education;Patient/family education;Orthotic Fit/Training;Manual techniques;Passive range of motion;Dry needling;Energy conservation;Taping    PT Next Visit Plan  continue with checking of SI alignment, STM along path of sciatic nerve right lower extremity and appropriate exercises. Continue stabilization as well as beginning vectors to improve glute med strength, march holds for hip flexors.    PT Home Exercise Plan  01/21/19: Sciatic nerve glide supine pillows under leg with ankle movement x 10 daily    Consulted and Agree with Plan of Care  Patient       Patient will benefit from skilled therapeutic intervention in order to improve the following deficits and impairments:  Pain, Decreased mobility, Decreased activity tolerance, Decreased endurance, Decreased strength, Hypomobility  Visit Diagnosis: Pain in right leg  Muscle weakness (generalized)  Difficulty in  walking, not elsewhere classified     Problem List Patient Active Problem List   Diagnosis Date Noted  . Post-traumatic osteoarthritis of right knee 03/14/2017  . S/P knee surgery 08/30/2016  . History of recurrent UTIs 09/19/2015  . Osteopenia 05/21/2015  . Osteoarthritis of right hip 05/21/2015  . Arrhythmia, long term 02/15/2014  . Esophageal reflux 02/15/2014  . Chronic back pain 06/01/2013  . Change in bowel habits 11/21/2012  . Diarrhea 11/21/2012    Rayetta Humphrey, PT CLT [phone] 01/27/2019, 11:03 AM  [facility] [address] [place], [place], [address] Phone: [phone]   Fax:  [phone]  Name: ADELAYDA CRITZ MRN: [id number] Date of Birth: 1940-06-06

## 2019-01-29 ENCOUNTER — Other Ambulatory Visit: Payer: Self-pay

## 2019-01-29 ENCOUNTER — Ambulatory Visit (HOSPITAL_COMMUNITY): Payer: Medicare Other | Admitting: Physical Therapy

## 2019-01-29 ENCOUNTER — Encounter (HOSPITAL_COMMUNITY): Payer: Self-pay | Admitting: Physical Therapy

## 2019-01-29 DIAGNOSIS — R262 Difficulty in walking, not elsewhere classified: Secondary | ICD-10-CM

## 2019-01-29 DIAGNOSIS — M79604 Pain in right leg: Secondary | ICD-10-CM

## 2019-01-29 DIAGNOSIS — M6281 Muscle weakness (generalized): Secondary | ICD-10-CM | POA: Diagnosis not present

## 2019-01-29 DIAGNOSIS — M25661 Stiffness of right knee, not elsewhere classified: Secondary | ICD-10-CM | POA: Diagnosis not present

## 2019-01-29 NOTE — Therapy (Signed)
[facility], [place], [address] Phone: [phone]   Fax:  [phone]  Physical Therapy Treatment  Patient Details  Name: [name] MRN: [id number] Date of Birth: 1940-06-23 Referring Provider (PT): Rod Can, MD   Encounter Date: 01/29/2019  PT End of Session - 01/29/19 1347    Visit Number  4    Number of Visits  8    Date for PT Re-Evaluation  02/18/19    Authorization Type  [employer] Medicare (No visit limit)    Authorization Time Period  01/21/19 - 02/18/19 (Progress note due on/by 10th visit)    Authorization - Visit Number  4    Authorization - Number of Visits  10    PT Start Time  0915    PT Stop Time  1000    PT Time Calculation (min)  45 min    Activity Tolerance  Patient tolerated treatment well;No increased pain    Behavior During Therapy  WFL for tasks assessed/performed       Past Medical History:  Diagnosis Date  . Arthritis   . Diverticulosis of colon   . Essential hypertension   . History of basal cell carcinoma (BCC) excision    02-10-2013  left cheek  s/p  moh's sx  . History of palpitations   . History of recurrent UTIs   . History of syncope    2015-- felt to vasovagal response to pain (per cardiologist note)  . IBS (irritable bowel syndrome)   . Open wound of left knee    warm soaks daily /  neosprin and dressing daily / per per still has drainage  . Patellar bursitis of left knee    pre-patella septic bursitis w/ foreign body  . Right thyroid nodule    x2 right side incidental finding on carotid duplex 03/ 2018-- thyroid ultrasound done , benign , follow-up in one year  . Wears hearing aid in both ears     Past Surgical History:  Procedure Laterality Date  . APPENDECTOMY  age 48  . BREAST BIOPSY  1962   benign  . CATARACT EXTRACTION W/PHACO  06/25/2011   Procedure: CATARACT EXTRACTION PHACO AND INTRAOCULAR LENS PLACEMENT (IOC);  Surgeon: Williams Che, MD;   Location: [facility];  Service: Ophthalmology;  Laterality: Right;  CDE: 8.90  . CATARACT EXTRACTION W/PHACO Left 05/05/2012   Procedure: CATARACT EXTRACTION PHACO AND INTRAOCULAR LENS PLACEMENT (IOC);  Surgeon: Williams Che, MD;  Location: [facility];  Service: Ophthalmology;  Laterality: Left;  CDE 12.78  . COLONOSCOPY  last one 11-26-2012  . EXCISION MORTON'S NEUROMA Right 2003  approx.  . INCISION AND DRAINAGE WOUND WITH FOREIGN BODY REMOVAL Left 08/30/2016   Procedure: LEFT KNEE INCISION AND DRAINAGE WOUND WITH FOREIGN BODY REMOVAL;  Surgeon: Sydnee Cabal, MD;  Location: [facility];  Service: Orthopedics;  Laterality: Left;  . KNEE ARTHROPLASTY Right 03/14/2017   Procedure: RIGHT TOTAL KNEE ARTHROPLASTY WITH COMPUTER NAVIGATION AND REMOVAL OF HARDWARE;  Surgeon: Rod Can, MD;  Location: [facility];  Service: Orthopedics;  Laterality: Right;  Adductor Block  . KNEE ARTHROSCOPY W/ ACL RECONSTRUCTION Right 1996  . KNEE CARTILAGE SURGERY Right age 44  . MOHS SURGERY  02/10/2013   left cheek -- BCC  . POSTERIOR REPAIR  05-21-2002   dr Glo Herring   symptomatic recetocele  . TONSILLECTOMY AND ADENOIDECTOMY  age 6  . TRANSTHORACIC ECHOCARDIOGRAM  04-17-2016  dr Domenic Polite   ef  60-65%/  trivial MR/ mild TR  . VAGINAL HYSTERECTOMY  1979   w/  Bilateral Salpingoophorectomy    There were no vitals filed for this visit.  Subjective Assessment - 01/29/19 0916    Subjective  PT states that due to it being early she is in quite a bit of pain; pain is sharp and in her RT buttock.    Pertinent History  Hx of RT Knee TKA, IBS    Limitations  House hold activities;Standing    How long can you sit comfortably?  Not limited.    How long can you stand comfortably?  Several hours    How long can you walk comfortably?  Several hours    Diagnostic tests  MRI results brought from patient from 1999 showing degenerative changes    Patient Stated Goals  Find exercises to help alleviate the pain     Currently in Pain?  Yes    Pain Score  8     Pain Location  Buttocks    Pain Orientation  Right    Pain Descriptors / Indicators  Sharp    Pain Type  Acute pain    Pain Onset  More than a month ago    Pain Frequency  Intermittent    Aggravating Factors   mornings    Pain Relieving Factors  not sure    Effect of Pain on Daily Activities  deals with the pain            [facility] Adult PT Treatment/Exercise - 01/29/19 0001      Exercises   Exercises  Lumbar      Lumbar Exercises: Stretches   Single Knee to Chest Stretch  Right;3 reps;30 seconds    Piriformis Stretch  Right;2 reps    Piriformis Stretch Limitations  sitting       Lumbar Exercises: Standing   Other Standing Lumbar Exercises  vector stance 15" x 3 B       Lumbar Exercises: Supine   Single Leg Bridge  10 reps   Lt    Other Supine Lumbar Exercises  hip ab/adduction x 10 (isometric to clear pelvic area)      Lumbar Exercises: Sidelying   Clam  10 reps    Hip Abduction  10 reps      Lumbar Exercises: Prone   Opposite Arm/Leg Raise  Right arm/Left leg;Left arm/Right leg;10 reps      Lumbar Exercises: Quadruped   Opposite Arm/Leg Raise Limitations  10      Manual Therapy   Manual Therapy  Muscle Energy Technique;Soft tissue mobilization    Manual therapy comments  muscle energy completed at beginning of session, manual at end of session    Soft tissue mobilization  to Rt glute, piriformis mm in prone    Muscle Energy Technique  to correct Rt anterior rotation in supine.               PT Short Term Goals - 01/29/19 1350      PT SHORT TERM GOAL #1   Title  Pt will be independent with HEP and perform consistently in order to maximize retrun to PLOF.    Time  2    Period  Weeks    Status  Achieved    Target Date  02/04/19        PT Long Term Goals - 01/29/19 1350      PT LONG TERM GOAL #1   Title  Patient will report a  decrease in the frequency of her pain from daily to no more than 3 times a  week.    Time  4    Period  Weeks    Status  On-going      PT LONG TERM GOAL #2   Title  Patient will report that her maximum pain has not exceeded a 2/10 over the course of a 1 week period indicating improved tolerance to daily activities.    Time  4    Period  Weeks    Status  On-going      PT LONG TERM GOAL #3   Title  Patient will demonstrate improvement of at least 1/2 MMT strength grade in all deficient muscle groups and deny pain with testing indicating improved strength for functional mobility.    Time  4    Period  Weeks    Status  On-going      PT LONG TERM GOAL #4   Title  Patient will report that her pain has not extended past her knee over the course of a 1 week period indicating improved centralization of pain.    Time  4    Period  Weeks    Status  On-going            Plan - 01/29/19 1347    Clinical Impression Statement  PT SI continues to be displaced but to a lesser extent today.  SI responds well to contract relax manual.  Added sidelying exercises to improve pelvic stability.    Personal Factors and Comorbidities  Age;Comorbidity 3+    Comorbidities  HTN, S/P Right TKA, osteopenia, IBS    Examination-Activity Limitations  Stand;Locomotion Level    Examination-Participation Restrictions  Yard Work;Community Activity    Stability/Clinical Decision Making  Stable/Uncomplicated    Rehab Potential  Good    PT Frequency  2x / week    PT Duration  4 weeks    PT Treatment/Interventions  ADLs/Self Care Home Management;Electrical Stimulation;Moist Heat;Cryotherapy;Traction;Ultrasound;DME Instruction;Gait training;Stair training;Functional mobility training;Therapeutic activities;Therapeutic exercise;Balance training;Neuromuscular re-education;Patient/family education;Orthotic Fit/Training;Manual techniques;Passive range of motion;Dry needling;Energy conservation;Taping    PT Next Visit Plan  continue with checking of SI alignment, STM along path of sciatic nerve  right lower extremity and appropriate exercises. Continue stabilization as well as beginning vectors to improve glute med strength, march holds for hip flexors.    PT Home Exercise Plan  01/21/19: Sciatic nerve glide supine pillows under leg with ankle movement x 10 daily    Consulted and Agree with Plan of Care  Patient       Patient will benefit from skilled therapeutic intervention in order to improve the following deficits and impairments:  Pain, Decreased mobility, Decreased activity tolerance, Decreased endurance, Decreased strength, Hypomobility  Visit Diagnosis: Pain in right leg  Muscle weakness (generalized)  Difficulty in walking, not elsewhere classified     Problem List Patient Active Problem List   Diagnosis Date Noted  . Post-traumatic osteoarthritis of right knee 03/14/2017  . S/P knee surgery 08/30/2016  . History of recurrent UTIs 09/19/2015  . Osteopenia 05/21/2015  . Osteoarthritis of right hip 05/21/2015  . Arrhythmia, long term 02/15/2014  . Esophageal reflux 02/15/2014  . Chronic back pain 06/01/2013  . Change in bowel habits 11/21/2012  . Diarrhea 11/21/2012    Rayetta Humphrey, PT CLT [phone] 01/29/2019, 1:56 PM  [facility] [address] [place], [place], [address] Phone: [phone]   Fax:  [phone]  Name: [name] MRN: [id number] Date of Birth: [birth year]

## 2019-02-03 ENCOUNTER — Ambulatory Visit (HOSPITAL_COMMUNITY): Payer: Medicare Other

## 2019-02-03 ENCOUNTER — Other Ambulatory Visit: Payer: Self-pay

## 2019-02-03 ENCOUNTER — Encounter (HOSPITAL_COMMUNITY): Payer: Self-pay

## 2019-02-03 DIAGNOSIS — M6281 Muscle weakness (generalized): Secondary | ICD-10-CM

## 2019-02-03 DIAGNOSIS — M79604 Pain in right leg: Secondary | ICD-10-CM | POA: Diagnosis not present

## 2019-02-03 DIAGNOSIS — M25661 Stiffness of right knee, not elsewhere classified: Secondary | ICD-10-CM | POA: Diagnosis not present

## 2019-02-03 DIAGNOSIS — R262 Difficulty in walking, not elsewhere classified: Secondary | ICD-10-CM | POA: Diagnosis not present

## 2019-02-03 NOTE — Therapy (Signed)
[facility], [place], [address] Phone: [phone]   Fax:  [phone]  Physical Therapy Treatment  Patient Details  Name: [name] MRN: [id number] Date of Birth: 30-Apr-1940 Referring Provider (PT): Rod Can, MD   Encounter Date: 02/03/2019  PT End of Session - 02/03/19 1535    Visit Number  5    Number of Visits  8    Date for PT Re-Evaluation  02/18/19    Authorization Type  [employer] Medicare (No visit limit)    Authorization Time Period  01/21/19 - 02/18/19 (Progress note due on/by 10th visit)    Authorization - Visit Number  5    Authorization - Number of Visits  10    PT Start Time  1534    PT Stop Time  1620    PT Time Calculation (min)  46 min    Activity Tolerance  Patient tolerated treatment well;No increased pain    Behavior During Therapy  WFL for tasks assessed/performed       Past Medical History:  Diagnosis Date  . Arthritis   . Diverticulosis of colon   . Essential hypertension   . History of basal cell carcinoma (BCC) excision    02-10-2013  left cheek  s/p  moh's sx  . History of palpitations   . History of recurrent UTIs   . History of syncope    2015-- felt to vasovagal response to pain (per cardiologist note)  . IBS (irritable bowel syndrome)   . Open wound of left knee    warm soaks daily /  neosprin and dressing daily / per per still has drainage  . Patellar bursitis of left knee    pre-patella septic bursitis w/ foreign body  . Right thyroid nodule    x2 right side incidental finding on carotid duplex 03/ 2018-- thyroid ultrasound done , benign , follow-up in one year  . Wears hearing aid in both ears     Past Surgical History:  Procedure Laterality Date  . APPENDECTOMY  age 80  . BREAST BIOPSY  1962   benign  . CATARACT EXTRACTION W/PHACO  06/25/2011   Procedure: CATARACT EXTRACTION PHACO AND INTRAOCULAR LENS PLACEMENT (IOC);  Surgeon: Williams Che, MD;   Location: [facility];  Service: Ophthalmology;  Laterality: Right;  CDE: 8.90  . CATARACT EXTRACTION W/PHACO Left 05/05/2012   Procedure: CATARACT EXTRACTION PHACO AND INTRAOCULAR LENS PLACEMENT (IOC);  Surgeon: Williams Che, MD;  Location: [facility];  Service: Ophthalmology;  Laterality: Left;  CDE 12.78  . COLONOSCOPY  last one 11-26-2012  . EXCISION MORTON'S NEUROMA Right 2003  approx.  . INCISION AND DRAINAGE WOUND WITH FOREIGN BODY REMOVAL Left 08/30/2016   Procedure: LEFT KNEE INCISION AND DRAINAGE WOUND WITH FOREIGN BODY REMOVAL;  Surgeon: Sydnee Cabal, MD;  Location: [facility];  Service: Orthopedics;  Laterality: Left;  . KNEE ARTHROPLASTY Right 03/14/2017   Procedure: RIGHT TOTAL KNEE ARTHROPLASTY WITH COMPUTER NAVIGATION AND REMOVAL OF HARDWARE;  Surgeon: Rod Can, MD;  Location: [facility];  Service: Orthopedics;  Laterality: Right;  Adductor Block  . KNEE ARTHROSCOPY W/ ACL RECONSTRUCTION Right 1996  . KNEE CARTILAGE SURGERY Right age 30  . MOHS SURGERY  02/10/2013   left cheek -- BCC  . POSTERIOR REPAIR  05-21-2002   dr Glo Herring   symptomatic recetocele  . TONSILLECTOMY AND ADENOIDECTOMY  age 77  . TRANSTHORACIC ECHOCARDIOGRAM  04-17-2016  dr Domenic Polite   ef  60-65%/  trivial MR/ mild TR  . VAGINAL HYSTERECTOMY  1979   w/  Bilateral Salpingoophorectomy    There were no vitals filed for this visit.  Subjective Assessment - 02/03/19 1527    Subjective  Pt reports she has began to have some pain on Lt hip.  Continues to have pain Rt hip in the mornings, completes exercises to be able to get out of bed.    Pertinent History  Hx of RT Knee TKA, IBS    Patient Stated Goals  Find exercises to help alleviate the pain    Currently in Pain?  Yes    Pain Score  2     Pain Location  Hip    Pain Orientation  Right    Pain Descriptors / Indicators  Burning;Sharp    Pain Type  Acute pain    Pain Onset  More than a month ago    Pain Frequency  Intermittent    Aggravating  Factors   mornings    Pain Relieving Factors  not sure    Effect of Pain on Daily Activities  deals with the pain                       [facility] Adult PT Treatment/Exercise - 02/03/19 0001      Exercises   Exercises  Lumbar      Lumbar Exercises: Stretches   Piriformis Stretch  Right;2 reps;30 seconds    Piriformis Stretch Limitations  sitting       Lumbar Exercises: Supine   Bent Knee Raise  10 reps;3 seconds    Bent Knee Raise Limitations  abdominal level 3 with 90/90 alternating wiht ab set    Bridge  10 reps    Bridge Limitations  2 sets; Rt LE closer to glut following MET    Straight Leg Raise  10 reps    Straight Leg Raises Limitations  Lt LE only follow MET    Other Supine Lumbar Exercises  hip ab/adduction x 10 (isometric to clear pelvic area)      Lumbar Exercises: Sidelying   Clam  10 reps    Clam Limitations  RTB 5" holds    Hip Abduction  10 reps      Lumbar Exercises: Prone   Opposite Arm/Leg Raise  Right arm/Left leg;Left arm/Right leg;10 reps      Manual Therapy   Manual Therapy  Muscle Energy Technique;Soft tissue mobilization    Manual therapy comments  muscle energy completed at beginning of session, manual at end of session    Muscle Energy Technique  to correct Rt anterior rotation in supine.               PT Short Term Goals - 01/29/19 1350      PT SHORT TERM GOAL #1   Title  Pt will be independent with HEP and perform consistently in order to maximize retrun to PLOF.    Time  2    Period  Weeks    Status  Achieved    Target Date  02/04/19        PT Long Term Goals - 01/29/19 1350      PT LONG TERM GOAL #1   Title  Patient will report a decrease in the frequency of her pain from daily to no more than 3 times a week.    Time  4    Period  Weeks    Status  On-going  PT LONG TERM GOAL #2   Title  Patient will report that her maximum pain has not exceeded a 2/10 over the course of a 1 week period indicating improved  tolerance to daily activities.    Time  4    Period  Weeks    Status  On-going      PT LONG TERM GOAL #3   Title  Patient will demonstrate improvement of at least 1/2 MMT strength grade in all deficient muscle groups and deny pain with testing indicating improved strength for functional mobility.    Time  4    Period  Weeks    Status  On-going      PT LONG TERM GOAL #4   Title  Patient will report that her pain has not extended past her knee over the course of a 1 week period indicating improved centralization of pain.    Time  4    Period  Weeks    Status  On-going            Plan - 02/03/19 1548    Clinical Impression Statement  Began session with MET for Rt SI anterior rotation for pain control and decreased LLD.  Continued core stability exercises for lumbar support, good form with all exercises.  Added theraband resistance for glut med strengthening.  No reoprts of increased pain through session.    Personal Factors and Comorbidities  Age;Comorbidity 3+    Comorbidities  HTN, S/P Right TKA, osteopenia, IBS    Examination-Activity Limitations  Stand;Locomotion Level    Examination-Participation Restrictions  Yard Work;Community Activity    Stability/Clinical Decision Making  Stable/Uncomplicated    Clinical Decision Making  Low    Rehab Potential  Good    PT Frequency  2x / week    PT Duration  4 weeks    PT Treatment/Interventions  ADLs/Self Care Home Management;Electrical Stimulation;Moist Heat;Cryotherapy;Traction;Ultrasound;DME Instruction;Gait training;Stair training;Functional mobility training;Therapeutic activities;Therapeutic exercise;Balance training;Neuromuscular re-education;Patient/family education;Orthotic Fit/Training;Manual techniques;Passive range of motion;Dry needling;Energy conservation;Taping    PT Next Visit Plan  continue with checking of SI alignment, STM along path of sciatic nerve right lower extremity and appropriate exercises. Continue stabilization  as well as beginning vectors to improve glute med strength, march holds for hip flexors.    PT Home Exercise Plan  01/21/19: Sciatic nerve glide supine pillows under leg with ankle movement x 10 daily       Patient will benefit from skilled therapeutic intervention in order to improve the following deficits and impairments:  Pain, Decreased mobility, Decreased activity tolerance, Decreased endurance, Decreased strength, Hypomobility  Visit Diagnosis: Pain in right leg  Muscle weakness (generalized)     Problem List Patient Active Problem List   Diagnosis Date Noted  . Post-traumatic osteoarthritis of right knee 03/14/2017  . S/P knee surgery 08/30/2016  . History of recurrent UTIs 09/19/2015  . Osteopenia 05/21/2015  . Osteoarthritis of right hip 05/21/2015  . Arrhythmia, long term 02/15/2014  . Esophageal reflux 02/15/2014  . Chronic back pain 06/01/2013  . Change in bowel habits 11/21/2012  . Diarrhea 11/21/2012   Ihor Austin, [place]; [place]  Aldona Lento 02/03/2019, 4:37 PM  [facility] [address] [place], [place], [address] Phone: [phone]   Fax:  [phone]  Name: [name] MRN: [id number] Date of Birth: 09-30-1940

## 2019-02-04 ENCOUNTER — Encounter: Payer: Self-pay | Admitting: Family Medicine

## 2019-02-04 ENCOUNTER — Ambulatory Visit (HOSPITAL_COMMUNITY): Payer: Medicare Other

## 2019-02-04 ENCOUNTER — Encounter (HOSPITAL_COMMUNITY): Payer: Self-pay

## 2019-02-04 DIAGNOSIS — M79604 Pain in right leg: Secondary | ICD-10-CM

## 2019-02-04 DIAGNOSIS — R262 Difficulty in walking, not elsewhere classified: Secondary | ICD-10-CM | POA: Diagnosis not present

## 2019-02-04 DIAGNOSIS — Z803 Family history of malignant neoplasm of breast: Secondary | ICD-10-CM | POA: Diagnosis not present

## 2019-02-04 DIAGNOSIS — M6281 Muscle weakness (generalized): Secondary | ICD-10-CM

## 2019-02-04 DIAGNOSIS — Z1231 Encounter for screening mammogram for malignant neoplasm of breast: Secondary | ICD-10-CM | POA: Diagnosis not present

## 2019-02-04 DIAGNOSIS — M25661 Stiffness of right knee, not elsewhere classified: Secondary | ICD-10-CM | POA: Diagnosis not present

## 2019-02-04 NOTE — Therapy (Signed)
[facility] [place], [place], [address] Phone: [phone]   Fax:  [phone]  Physical Therapy Treatment  Patient Details  Name: [name] MRN: [id number] Date of Birth: Mar 22, 1940 Referring Provider (PT): Rod Can, MD   Encounter Date: 02/04/2019  PT End of Session - 02/04/19 1008    Visit Number  6    Number of Visits  8    Date for PT Re-Evaluation  02/18/19    Authorization Type  [employer] Medicare (No visit limit)    Authorization Time Period  01/21/19 - 02/18/19 (Progress note due on/by 10th visit)    Authorization - Visit Number  6    Authorization - Number of Visits  10    PT Start Time  1003    PT Stop Time  1043    PT Time Calculation (min)  40 min    Activity Tolerance  Patient tolerated treatment well;No increased pain    Behavior During Therapy  WFL for tasks assessed/performed       Past Medical History:  Diagnosis Date  . Arthritis   . Diverticulosis of colon   . Essential hypertension   . History of basal cell carcinoma (BCC) excision    02-10-2013  left cheek  s/p  moh's sx  . History of palpitations   . History of recurrent UTIs   . History of syncope    2015-- felt to vasovagal response to pain (per cardiologist note)  . IBS (irritable bowel syndrome)   . Open wound of left knee    warm soaks daily /  neosprin and dressing daily / per per still has drainage  . Patellar bursitis of left knee    pre-patella septic bursitis w/ foreign body  . Right thyroid nodule    x2 right side incidental finding on carotid duplex 03/ 2018-- thyroid ultrasound done , benign , follow-up in one year  . Wears hearing aid in both ears     Past Surgical History:  Procedure Laterality Date  . APPENDECTOMY  age 68  . BREAST BIOPSY  1962   benign  . CATARACT EXTRACTION W/PHACO  06/25/2011   Procedure: CATARACT EXTRACTION PHACO AND INTRAOCULAR LENS PLACEMENT (IOC);  Surgeon: Williams Che, MD;   Location: [facility];  Service: Ophthalmology;  Laterality: Right;  CDE: 8.90  . CATARACT EXTRACTION W/PHACO Left 05/05/2012   Procedure: CATARACT EXTRACTION PHACO AND INTRAOCULAR LENS PLACEMENT (IOC);  Surgeon: Williams Che, MD;  Location: [facility];  Service: Ophthalmology;  Laterality: Left;  CDE 12.78  . COLONOSCOPY  last one 11-26-2012  . EXCISION MORTON'S NEUROMA Right 2003  approx.  . INCISION AND DRAINAGE WOUND WITH FOREIGN BODY REMOVAL Left 08/30/2016   Procedure: LEFT KNEE INCISION AND DRAINAGE WOUND WITH FOREIGN BODY REMOVAL;  Surgeon: Sydnee Cabal, MD;  Location: [facility];  Service: Orthopedics;  Laterality: Left;  . KNEE ARTHROPLASTY Right 03/14/2017   Procedure: RIGHT TOTAL KNEE ARTHROPLASTY WITH COMPUTER NAVIGATION AND REMOVAL OF HARDWARE;  Surgeon: Rod Can, MD;  Location: [facility];  Service: Orthopedics;  Laterality: Right;  Adductor Block  . KNEE ARTHROSCOPY W/ ACL RECONSTRUCTION Right 1996  . KNEE CARTILAGE SURGERY Right age 43  . MOHS SURGERY  02/10/2013   left cheek -- BCC  . POSTERIOR REPAIR  05-21-2002   dr Glo Herring   symptomatic recetocele  . TONSILLECTOMY AND ADENOIDECTOMY  age 57  . TRANSTHORACIC ECHOCARDIOGRAM  04-17-2016  dr Domenic Polite   ef  60-65%/  trivial MR/ mild TR  . VAGINAL HYSTERECTOMY  1979   w/  Bilateral Salpingoophorectomy    There were no vitals filed for this visit.  Subjective Assessment - 02/04/19 1004    Subjective  Pt shes continues to have pain in Rt hip in the morning.    Pertinent History  Hx of RT Knee TKA, IBS    Patient Stated Goals  Find exercises to help alleviate the pain    Currently in Pain?  Yes    Pain Score  1     Pain Location  Hip    Pain Orientation  Right    Pain Type  Acute pain    Pain Onset  More than a month ago    Pain Frequency  Intermittent    Aggravating Factors   morning    Pain Relieving Factors  not sure    Effect of Pain on Daily Activities  deals with the pain                        [facility] Adult PT Treatment/Exercise - 02/04/19 0001      Exercises   Exercises  Lumbar      Lumbar Exercises: Supine   Bent Knee Raise  10 reps;3 seconds    Bent Knee Raise Limitations  abdominal level 3 with 90/90 alternating wiht ab set    Bridge  10 reps    Bridge Limitations  2 sets; Rt LE closer to glut following MET    Single Leg Bridge  10 reps    Large Ball Abdominal Isometric  10 reps;4 seconds    Large Ball Oblique Isometric  10 reps;4 seconds    Other Supine Lumbar Exercises  hip ab/adduction x 10 (isometric to clear pelvic area)      Lumbar Exercises: Quadruped   Opposite Arm/Leg Raise  10 reps      Manual Therapy   Manual Therapy  Muscle Energy Technique;Soft tissue mobilization    Manual therapy comments  muscle energy completed at beginning of session, manual at end of session    Soft tissue mobilization  to Rt glute, piriformis mm in prone    Muscle Energy Technique  to correct Rt anterior rotation in supine.               PT Short Term Goals - 01/29/19 1350      PT SHORT TERM GOAL #1   Title  Pt will be independent with HEP and perform consistently in order to maximize retrun to PLOF.    Time  2    Period  Weeks    Status  Achieved    Target Date  02/04/19        PT Long Term Goals - 01/29/19 1350      PT LONG TERM GOAL #1   Title  Patient will report a decrease in the frequency of her pain from daily to no more than 3 times a week.    Time  4    Period  Weeks    Status  On-going      PT LONG TERM GOAL #2   Title  Patient will report that her maximum pain has not exceeded a 2/10 over the course of a 1 week period indicating improved tolerance to daily activities.    Time  4    Period  Weeks    Status  On-going      PT LONG TERM GOAL #  3   Title  Patient will demonstrate improvement of at least 1/2 MMT strength grade in all deficient muscle groups and deny pain with testing indicating improved strength for  functional mobility.    Time  4    Period  Weeks    Status  On-going      PT LONG TERM GOAL #4   Title  Patient will report that her pain has not extended past her knee over the course of a 1 week period indicating improved centralization of pain.    Time  4    Period  Weeks    Status  On-going            Plan - 02/04/19 1023    Clinical Impression Statement  Pt presents with improved SI alignment though does continue to have displacement and Rt LE LLD.  No pain anterior or posterior pelvic rotation though does c/o pain during transition.  Sessoin focus with core stability, added isometric abdominal strengtheing with theraball.  Continued wiht STM to address soft tissue restrictions Rt piriformis/gluteal region.  No reoprts of increased pain at EOS>    Personal Factors and Comorbidities  Age;Comorbidity 3+    Comorbidities  HTN, S/P Right TKA, osteopenia, IBS    Examination-Activity Limitations  Stand;Locomotion Level    Examination-Participation Restrictions  Yard Work;Community Activity    Stability/Clinical Decision Making  Stable/Uncomplicated    Clinical Decision Making  Low    Rehab Potential  Good    PT Frequency  2x / week    PT Duration  4 weeks    PT Treatment/Interventions  ADLs/Self Care Home Management;Electrical Stimulation;Moist Heat;Cryotherapy;Traction;Ultrasound;DME Instruction;Gait training;Stair training;Functional mobility training;Therapeutic activities;Therapeutic exercise;Balance training;Neuromuscular re-education;Patient/family education;Orthotic Fit/Training;Manual techniques;Passive range of motion;Dry needling;Energy conservation;Taping    PT Next Visit Plan  continue with checking of SI alignment, STM along path of sciatic nerve right lower extremity and appropriate exercises. Continue stabilization as well as beginning vectors to improve glute med strength, march holds for hip flexors.    PT Home Exercise Plan  01/21/19: Sciatic nerve glide supine pillows  under leg with ankle movement x 10 daily       Patient will benefit from skilled therapeutic intervention in order to improve the following deficits and impairments:  Pain, Decreased mobility, Decreased activity tolerance, Decreased endurance, Decreased strength, Hypomobility  Visit Diagnosis: Pain in right leg  Muscle weakness (generalized)     Problem List Patient Active Problem List   Diagnosis Date Noted  . Post-traumatic osteoarthritis of right knee 03/14/2017  . S/P knee surgery 08/30/2016  . History of recurrent UTIs 09/19/2015  . Osteopenia 05/21/2015  . Osteoarthritis of right hip 05/21/2015  . Arrhythmia, long term 02/15/2014  . Esophageal reflux 02/15/2014  . Chronic back pain 06/01/2013  . Change in bowel habits 11/21/2012  . Diarrhea 11/21/2012   Ihor Austin, [place]; [place]  Aldona Lento 02/04/2019, 1:09 PM  [facility] [address] [place], [place], [address] Phone: [phone]   Fax:  [phone]  Name: [name] MRN: [id number] Date of Birth: 07/28/1940

## 2019-02-10 ENCOUNTER — Encounter (HOSPITAL_COMMUNITY): Payer: Self-pay | Admitting: Physical Therapy

## 2019-02-10 ENCOUNTER — Ambulatory Visit (HOSPITAL_COMMUNITY): Payer: Medicare Other | Attending: Orthopedic Surgery | Admitting: Physical Therapy

## 2019-02-10 ENCOUNTER — Other Ambulatory Visit: Payer: Self-pay

## 2019-02-10 DIAGNOSIS — M545 Low back pain: Secondary | ICD-10-CM | POA: Diagnosis not present

## 2019-02-10 DIAGNOSIS — M6281 Muscle weakness (generalized): Secondary | ICD-10-CM | POA: Diagnosis not present

## 2019-02-10 DIAGNOSIS — M79604 Pain in right leg: Secondary | ICD-10-CM | POA: Diagnosis not present

## 2019-02-10 DIAGNOSIS — S76311A Strain of muscle, fascia and tendon of the posterior muscle group at thigh level, right thigh, initial encounter: Secondary | ICD-10-CM | POA: Diagnosis not present

## 2019-02-10 DIAGNOSIS — Z96651 Presence of right artificial knee joint: Secondary | ICD-10-CM | POA: Diagnosis not present

## 2019-02-10 NOTE — Therapy (Signed)
[place], [place], [address] Phone: [phone]   Fax:  [phone]  Physical Therapy Treatment  Patient Details  Name: [name] MRN: [id number] Date of Birth: 1940/06/26 Referring Provider (PT): Rod Can, MD   Encounter Date: 02/10/2019  PT End of Session - 02/10/19 1135    Visit Number  7    Number of Visits  8    Date for PT Re-Evaluation  02/18/19    Authorization Type  [employer] Medicare (No visit limit)    Authorization Time Period  01/21/19 - 02/18/19 (Progress note due on/by 10th visit)    Authorization - Visit Number  7    Authorization - Number of Visits  10    PT Start Time  1122    PT Stop Time  1200    PT Time Calculation (min)  38 min    Activity Tolerance  Patient tolerated treatment well;No increased pain    Behavior During Therapy  WFL for tasks assessed/performed       Past Medical History:  Diagnosis Date  . Arthritis   . Diverticulosis of colon   . Essential hypertension   . History of basal cell carcinoma (BCC) excision    02-10-2013  left cheek  s/p  moh's sx  . History of palpitations   . History of recurrent UTIs   . History of syncope    2015-- felt to vasovagal response to pain (per cardiologist note)  . IBS (irritable bowel syndrome)   . Open wound of left knee    warm soaks daily /  neosprin and dressing daily / per per still has drainage  . Patellar bursitis of left knee    pre-patella septic bursitis w/ foreign body  . Right thyroid nodule    x2 right side incidental finding on carotid duplex 03/ 2018-- thyroid ultrasound done , benign , follow-up in one year  . Wears hearing aid in both ears     Past Surgical History:  Procedure Laterality Date  . APPENDECTOMY  age 33  . BREAST BIOPSY  1962   benign  . CATARACT EXTRACTION W/PHACO  06/25/2011   Procedure: CATARACT EXTRACTION PHACO AND INTRAOCULAR LENS PLACEMENT (IOC);  Surgeon: Williams Che, MD;  Location:  [facility];  Service: Ophthalmology;  Laterality: Right;  CDE: 8.90  . CATARACT EXTRACTION W/PHACO Left 05/05/2012   Procedure: CATARACT EXTRACTION PHACO AND INTRAOCULAR LENS PLACEMENT (IOC);  Surgeon: Williams Che, MD;  Location: [facility];  Service: Ophthalmology;  Laterality: Left;  CDE 12.78  . COLONOSCOPY  last one 11-26-2012  . EXCISION MORTON'S NEUROMA Right 2003  approx.  . INCISION AND DRAINAGE WOUND WITH FOREIGN BODY REMOVAL Left 08/30/2016   Procedure: LEFT KNEE INCISION AND DRAINAGE WOUND WITH FOREIGN BODY REMOVAL;  Surgeon: Sydnee Cabal, MD;  Location: [facility];  Service: Orthopedics;  Laterality: Left;  . KNEE ARTHROPLASTY Right 03/14/2017   Procedure: RIGHT TOTAL KNEE ARTHROPLASTY WITH COMPUTER NAVIGATION AND REMOVAL OF HARDWARE;  Surgeon: Rod Can, MD;  Location: [facility];  Service: Orthopedics;  Laterality: Right;  Adductor Block  . KNEE ARTHROSCOPY W/ ACL RECONSTRUCTION Right 1996  . KNEE CARTILAGE SURGERY Right age 19  . MOHS SURGERY  02/10/2013   left cheek -- BCC  . POSTERIOR REPAIR  05-21-2002   dr Glo Herring   symptomatic recetocele  . TONSILLECTOMY AND ADENOIDECTOMY  age 4  . TRANSTHORACIC ECHOCARDIOGRAM  04-17-2016  dr Domenic Polite   ef  60-65%/  trivial MR/ mild TR  . VAGINAL HYSTERECTOMY  1979   w/  Bilateral Salpingoophorectomy    There were no vitals filed for this visit.  Subjective Assessment - 02/10/19 1128    Subjective  Patient denied any pain currently. She reported having an MD appointment later.    Pertinent History  Hx of RT Knee TKA, IBS    Patient Stated Goals  Find exercises to help alleviate the pain    Pain Onset  More than a month ago                       [facility] Adult PT Treatment/Exercise - 02/10/19 0001      Lumbar Exercises: Standing   Functional Squats  --   6 reps. 5-10 seconds.   Other Standing Lumbar Exercises  Vector stance forward, side, back 5'' 5xeach. Intermittent UE assist      Lumbar Exercises:  Supine   Bridge  10 reps    Bridge Limitations  2 sets    Large Ball Abdominal Isometric  10 reps;4 seconds    Large Ball Oblique Isometric  10 reps;4 seconds      Manual Therapy   Manual Therapy  Joint mobilization    Joint Mobilization  Grade II long axis distraction right hip 4x30''                PT Short Term Goals - 01/29/19 1350      PT SHORT TERM GOAL #1   Title  Pt will be independent with HEP and perform consistently in order to maximize retrun to PLOF.    Time  2    Period  Weeks    Status  Achieved    Target Date  02/04/19        PT Long Term Goals - 01/29/19 1350      PT LONG TERM GOAL #1   Title  Patient will report a decrease in the frequency of her pain from daily to no more than 3 times a week.    Time  4    Period  Weeks    Status  On-going      PT LONG TERM GOAL #2   Title  Patient will report that her maximum pain has not exceeded a 2/10 over the course of a 1 week period indicating improved tolerance to daily activities.    Time  4    Period  Weeks    Status  On-going      PT LONG TERM GOAL #3   Title  Patient will demonstrate improvement of at least 1/2 MMT strength grade in all deficient muscle groups and deny pain with testing indicating improved strength for functional mobility.    Time  4    Period  Weeks    Status  On-going      PT LONG TERM GOAL #4   Title  Patient will report that her pain has not extended past her knee over the course of a 1 week period indicating improved centralization of pain.    Time  4    Period  Weeks    Status  On-going            Plan - 02/10/19 1149    Clinical Impression Statement  Patient reported continuing to feel pain in the mornings. She stated that she is following up with her MD and will let us know what recommendations he makes. This session added long  axis distraction for pain relief to patient's right hip which patient tolerated well. Plan to perform re-assessment next session.     Personal Factors and Comorbidities  Age;Comorbidity 3+    Comorbidities  HTN, S/P Right TKA, osteopenia, IBS    Examination-Activity Limitations  Stand;Locomotion Level    Examination-Participation Restrictions  Yard Work;Community Activity    Stability/Clinical Decision Making  Stable/Uncomplicated    Rehab Potential  Good    PT Frequency  2x / week    PT Duration  4 weeks    PT Treatment/Interventions  ADLs/Self Care Home Management;Electrical Stimulation;Moist Heat;Cryotherapy;Traction;Ultrasound;DME Instruction;Gait training;Stair training;Functional mobility training;Therapeutic activities;Therapeutic exercise;Balance training;Neuromuscular re-education;Patient/family education;Orthotic Fit/Training;Manual techniques;Passive range of motion;Dry needling;Energy conservation;Taping    PT Next Visit Plan  Re-assess next session. Follow-up regarding MD appointment.    PT Home Exercise Plan  01/21/19: Sciatic nerve glide supine pillows under leg with ankle movement x 10 daily       Patient will benefit from skilled therapeutic intervention in order to improve the following deficits and impairments:  Pain, Decreased mobility, Decreased activity tolerance, Decreased endurance, Decreased strength, Hypomobility  Visit Diagnosis: Pain in right leg  Muscle weakness (generalized)     Problem List Patient Active Problem List   Diagnosis Date Noted  . Post-traumatic osteoarthritis of right knee 03/14/2017  . S/P knee surgery 08/30/2016  . History of recurrent UTIs 09/19/2015  . Osteopenia 05/21/2015  . Osteoarthritis of right hip 05/21/2015  . Arrhythmia, long term 02/15/2014  . Esophageal reflux 02/15/2014  . Chronic back pain 06/01/2013  . Change in bowel habits 11/21/2012  . Diarrhea 11/21/2012   Clarene Critchley PT, DPT 12:12 PM, 02/10/19 [place], [place], [address] Phone: [phone]   Fax:   [phone]  Name: [name] MRN: [id number] Date of Birth: April 01, 1940

## 2019-02-11 ENCOUNTER — Telehealth (HOSPITAL_COMMUNITY): Payer: Self-pay | Admitting: Physical Therapy

## 2019-02-11 NOTE — Telephone Encounter (Signed)
She will see new MD about her back issues and will bring in a new referral from the new MD. She understands she will have a evaluation with Otho Ket on 02/17/2019 since she will be under new MD care

## 2019-02-12 ENCOUNTER — Encounter (HOSPITAL_COMMUNITY): Payer: Medicare Other | Admitting: Physical Therapy

## 2019-02-16 ENCOUNTER — Telehealth (HOSPITAL_COMMUNITY): Payer: Self-pay | Admitting: Physical Therapy

## 2019-02-16 DIAGNOSIS — M545 Low back pain: Secondary | ICD-10-CM | POA: Diagnosis not present

## 2019-02-16 NOTE — Telephone Encounter (Signed)
She saw MD and he is not recommending PT at this time, please have [facility] call patient soon.

## 2019-02-17 ENCOUNTER — Ambulatory Visit (HOSPITAL_COMMUNITY): Payer: Medicare Other | Admitting: Physical Therapy

## 2019-02-18 ENCOUNTER — Telehealth (HOSPITAL_COMMUNITY): Payer: Self-pay | Admitting: Physical Therapy

## 2019-02-18 ENCOUNTER — Encounter (HOSPITAL_COMMUNITY): Payer: Self-pay | Admitting: Physical Therapy

## 2019-02-18 NOTE — Therapy (Unsigned)
[place], [place], [address] Phone: [phone]   Fax:  [phone]  Patient Details  Name: [name] MRN: [id number] Date of Birth: 1940-10-02 Referring Provider:  No ref. provider found  Encounter Date: 02/18/2019  PHYSICAL THERAPY DISCHARGE SUMMARY  Visits from Start of Care: 7  Current functional level related to goals / functional outcomes: Patient did not return for re-assessment appointment. Patient continuing to report pain in the morning.     Remaining deficits: Patient did not return for re-assessment appointment. Patient continuing to report pain in the morning.    Education / Equipment: Educated on [employer]: Patient agrees to discharge.  Patient goals were partially met. Patient is being discharged due to not returning since the last visit.  ?????     Clarene Critchley PT, DPT 1:48 PM, 02/18/19 [place] [address] [place], [place], [address] Phone: [phone]   Fax:  [phone]

## 2019-02-18 NOTE — Telephone Encounter (Signed)
Called patient regarding patient seeing MD and recommending not returning to physical therapy at this time. Reported that she asked MD to send over notes to therapist, but these have not arrived yet. Patient stated she is planning to continue HEP and will call clinic if needed.  Clarene Critchley PT, DPT 1:42 PM, 02/18/19 [phone]

## 2019-02-19 ENCOUNTER — Ambulatory Visit (HOSPITAL_COMMUNITY): Payer: Medicare Other | Admitting: Physical Therapy

## 2019-03-04 ENCOUNTER — Ambulatory Visit (INDEPENDENT_AMBULATORY_CARE_PROVIDER_SITE_OTHER): Payer: Medicare Other | Admitting: Family Medicine

## 2019-03-04 VITALS — BP 137/78

## 2019-03-04 DIAGNOSIS — M5430 Sciatica, unspecified side: Secondary | ICD-10-CM | POA: Diagnosis not present

## 2019-03-04 MED ORDER — PREDNISONE 20 MG PO TABS: ORAL_TABLET | ORAL | 0 refills | Status: DC

## 2019-03-04 NOTE — Progress Notes (Signed)
   Subjective:  Audio plus video  Patient ID: [name], female    DOB: 03-Aug-1940, 79 y.o.   MRN: [id number]   bp 137/78  Back Pain This is a new problem. Episode onset: started before thanksgiving.  saw Dr. Nelva Bush a couple of weeks ago for her back pain.   Virtual Visit via Video Note  I connected with JULIETT STJOHN on 03/04/19 at  2:00 PM EST by a video enabled telemedicine application and verified that I am speaking with the correct person using two identifiers.  Location: Patient: home Provider: office   I discussed the limitations of evaluation and management by telemedicine and the availability of in person appointments. The patient expressed understanding and agreed to proceed.  History of Present Illness:    Observations/Objective:   Assessment and Plan:   Follow Up Instructions:    I discussed the assessment and treatment plan with the patient. The patient was provided an opportunity to ask questions and all were answered. The patient agreed with the plan and demonstrated an understanding of the instructions.   The patient was advised to call back or seek an in-person evaluation if the symptoms worsen or if the condition fails to improve as anticipated.  I provided 22 minutes of non-face-to-face time during this encounter.   Patient is already seen an orthopedist with specific skills and chronic back pain.  Was somewhat disappointed with their response.  Notes the lumbar pain with radiation into her legs.  Feels similar to sciatica that she has had in the past.  Has also been struggling with knee issues.  See prior note.  Has been tending to physical therapy in this regard   At times fairly impressive pain that limits her ability to do what she wants to do and what she needs to do  Review of Systems  Musculoskeletal: Positive for back pain.       Objective:   Physical Exam   Virtual     Assessment & Plan:  Impression back pain with probable  elements of sciatica.  Discussed.  Will give trial of steroids.  If this does not resolve may need to investigate further with MRI etc.

## 2019-03-08 NOTE — Patient Instructions (Signed)
Sciatica

## 2019-03-12 ENCOUNTER — Encounter: Payer: Self-pay | Admitting: Family Medicine

## 2019-03-16 ENCOUNTER — Telehealth: Payer: Self-pay | Admitting: Family Medicine

## 2019-03-16 NOTE — Telephone Encounter (Signed)
Patient scheduled face to face office visit with Dr Richardson Landry to evaluate back pain.

## 2019-03-16 NOTE — Telephone Encounter (Signed)
Rec face to face visit to evaluate back, that will be the focus of our visit, medicare getting very grumpy about authorizing mri's and a f to f visit will help support that if we need to do

## 2019-03-16 NOTE — Telephone Encounter (Signed)
Patient is still having back pain since coming out prednisone 20 mg. Please advise

## 2019-03-17 ENCOUNTER — Telehealth: Payer: Self-pay | Admitting: Family Medicine

## 2019-03-17 ENCOUNTER — Encounter: Payer: Self-pay | Admitting: Family Medicine

## 2019-03-17 ENCOUNTER — Other Ambulatory Visit: Payer: Self-pay

## 2019-03-17 ENCOUNTER — Ambulatory Visit (INDEPENDENT_AMBULATORY_CARE_PROVIDER_SITE_OTHER): Payer: Medicare Other | Admitting: Family Medicine

## 2019-03-17 VITALS — BP 138/82 | Temp 96.7°F | Wt 116.6 lb

## 2019-03-17 DIAGNOSIS — M4800 Spinal stenosis, site unspecified: Secondary | ICD-10-CM

## 2019-03-17 DIAGNOSIS — M5430 Sciatica, unspecified side: Secondary | ICD-10-CM

## 2019-03-17 NOTE — Telephone Encounter (Signed)
PA attempted through CoverMyMeds for patients Imvexxy 4 mcg inserts. Await decision.

## 2019-03-17 NOTE — Progress Notes (Signed)
   Subjective:    Patient ID: [name], female    DOB: 07/14/1940, 79 y.o.   MRN: [id number]  Back Pain This is a new problem. Episode onset: Thanksgiving. The pain is present in the gluteal. Radiates to: right heel. Stiffness is present in the morning. Treatments tried: steroids. The treatment provided moderate relief.   Pt had virtual visit with provider in January for sciatica. Prednisone did help some.   (Pt is waiting to get her 2nd Covid vaccine)  Back still causing significant pain  Pt notes mnimal improvement with steriods  Pt thruout the day has ongoing pain  Pt notes still quite limber, pain more of a chalenge in right sciatic notch region pain sharp   pred notes substantial pain and discomfort temp improved   Back doctor did xrays of the back  Saw dgenerative changes  But no compression fx  Pt notes some mornings very painful , pain radiates into the foot       Review of Systems  Musculoskeletal: Positive for back pain.       Objective:   Physical Exam  Alert vitals stable, NAD. Blood pressure good on repeat. HEENT normal. Lungs clear. Heart regular rate and rhythm. Right sciatic notch tenderness positive straight leg raise.  Fairly severe pain with extension.  Reflexes intact.  No obvious weakness.      Assessment & Plan:  Impression progressive back pain with right sciatica.  Has had physical.  Therapy did not help.  Saw an orthopedic surgeon.  Had an x-ray which showed degenerative changes.  Continues to suffer from daily pain.  Impacting very much her ability to do what she wants to do and needs to do.  Has tried yoga.  Still ongoing pain.  Now neuropathic features stretching from right sciatic notch all the way out to her foot.  Our next step is an MRI to see if patient has an anatomical issue that would respond to therapeutic injections etc. discussed at length  Greater than 50% of this 30 minute face to face visit was spent in counseling and  discussion and coordination of care regarding the above diagnosis/diagnosies

## 2019-03-20 NOTE — Telephone Encounter (Addendum)
Prescription approved thru 02/05/2020- Reference # [id number]. Patient notified

## 2019-03-31 ENCOUNTER — Other Ambulatory Visit: Payer: Self-pay | Admitting: Family Medicine

## 2019-04-01 ENCOUNTER — Other Ambulatory Visit: Payer: Self-pay | Admitting: Family Medicine

## 2019-04-01 DIAGNOSIS — H35351 Cystoid macular degeneration, right eye: Secondary | ICD-10-CM | POA: Diagnosis not present

## 2019-04-01 DIAGNOSIS — H26492 Other secondary cataract, left eye: Secondary | ICD-10-CM | POA: Diagnosis not present

## 2019-04-08 ENCOUNTER — Other Ambulatory Visit: Payer: Self-pay

## 2019-04-08 ENCOUNTER — Ambulatory Visit (HOSPITAL_COMMUNITY)
Admission: RE | Admit: 2019-04-08 | Discharge: 2019-04-08 | Disposition: A | Discharge status: Home / Self Care | Payer: Medicare Other | Source: Ambulatory Visit | Attending: Family Medicine | Admitting: Family Medicine

## 2019-04-08 DIAGNOSIS — M545 Low back pain: Secondary | ICD-10-CM | POA: Diagnosis not present

## 2019-04-08 DIAGNOSIS — M5430 Sciatica, unspecified side: Secondary | ICD-10-CM | POA: Diagnosis not present

## 2019-04-10 NOTE — Addendum Note (Signed)
Addended by: Dairl Ponder on: 04/10/2019 10:05 AM   Modules accepted: Orders

## 2019-04-14 DIAGNOSIS — D049 Carcinoma in situ of skin, unspecified: Secondary | ICD-10-CM | POA: Diagnosis not present

## 2019-04-14 DIAGNOSIS — L570 Actinic keratosis: Secondary | ICD-10-CM | POA: Diagnosis not present

## 2019-04-21 DIAGNOSIS — M4316 Spondylolisthesis, lumbar region: Secondary | ICD-10-CM | POA: Diagnosis not present

## 2019-04-22 ENCOUNTER — Other Ambulatory Visit: Payer: Self-pay | Admitting: Family Medicine

## 2019-04-28 ENCOUNTER — Ambulatory Visit: Payer: Medicare Other | Admitting: Family Medicine

## 2019-04-29 ENCOUNTER — Ambulatory Visit: Payer: Medicare Other | Admitting: Dermatology

## 2019-04-29 ENCOUNTER — Other Ambulatory Visit: Payer: Self-pay

## 2019-04-29 ENCOUNTER — Encounter: Payer: Self-pay | Admitting: Dermatology

## 2019-04-29 DIAGNOSIS — D485 Neoplasm of uncertain behavior of skin: Secondary | ICD-10-CM | POA: Diagnosis not present

## 2019-04-29 DIAGNOSIS — C4491 Basal cell carcinoma of skin, unspecified: Secondary | ICD-10-CM

## 2019-04-29 DIAGNOSIS — D492 Neoplasm of unspecified behavior of bone, soft tissue, and skin: Secondary | ICD-10-CM

## 2019-04-29 DIAGNOSIS — C44319 Basal cell carcinoma of skin of other parts of face: Secondary | ICD-10-CM | POA: Diagnosis not present

## 2019-04-29 DIAGNOSIS — C44311 Basal cell carcinoma of skin of nose: Secondary | ICD-10-CM | POA: Diagnosis not present

## 2019-04-29 HISTORY — DX: Basal cell carcinoma of skin, unspecified: C44.91

## 2019-04-30 NOTE — Progress Notes (Addendum)
   Follow-Up Visit   Subjective  [name] is a 79 y.o. female who presents for the following: Procedure (Patient here today for biopsy of lesion on left sidewall of her nose.).  Growths Location: Face Duration: Months Quality: Never heal Associated Signs/Symptoms: Modifying Factors:  Severity:  Timing: Context: For biopsies  The following portions of the chart were reviewed this encounter and updated as appropriate:     Objective  Well appearing patient in no apparent distress; mood and affect are within normal limits. Patient returns at a more convenient time to obtain facial biopsies.  There is a nonhealing waxy bump adjacent to the right nostril and a crusted papule on the left upper inner nose which are possible nonmelanoma skin cancers.  Each of these was biopsied and home care instructions given.  [name] will check the results on MyChart in 2 days and call us to discuss the next step. A focused examination was performed including head and neck.. Relevant physical exam findings are noted in the Assessment and Plan.   Assessment & Plan  Neoplasm of skin (2) Right Inner Cheek  Skin / nail biopsy Type of biopsy: tangential   Informed consent: discussed and consent obtained   Timeout: patient name, date of birth, surgical site, and procedure verified   Procedure prep:  Patient was prepped and draped in usual sterile fashion Anesthesia: the lesion was anesthetized in a standard fashion   Anesthetic:  1% lidocaine w/ epinephrine 1-100,000 local infiltration Instrument used: flexible razor blade   Hemostasis achieved with: ferric subsulfate   Outcome: patient tolerated procedure well   Post-procedure details: sterile dressing applied and wound care instructions given   Dressing type: petrolatum   Additional details:  Patient identified lesion of concern.  Lesion identified by physician.  Specimen 1 - Surgical pathology Differential Diagnosis: R/O BCC vs  SCC Check Margins: No  Left Side Nose  Skin / nail biopsy Type of biopsy: tangential   Anesthesia: the lesion was anesthetized in a standard fashion   Anesthetic:  1% lidocaine w/ epinephrine 1-100,000 local infiltration Instrument used: flexible razor blade   Hemostasis achieved with: ferric subsulfate   Outcome: patient tolerated procedure well   Post-procedure details: sterile dressing applied and wound care instructions given   Dressing type: petrolatum   Additional details:  Patient identified lesion of concern.  Lesion identified by physician.  Specimen 2 - Surgical pathology Differential Diagnosis: BCC vs SCC Check Margins: No

## 2019-05-01 NOTE — Patient Instructions (Signed)
Patient returns at a more convenient time to obtain facial biopsies.  There is a nonhealing waxy bump adjacent to the right nostril and a crusted papule on the left upper inner nose which are possible nonmelanoma skin cancers.  Each of these was biopsied and home care instructions given.  [name] will check the results on MyChart in 2 days and call us to discuss the next step.

## 2019-05-04 ENCOUNTER — Telehealth: Payer: Self-pay | Admitting: Dermatology

## 2019-05-04 ENCOUNTER — Telehealth: Payer: Self-pay

## 2019-05-04 DIAGNOSIS — C44311 Basal cell carcinoma of skin of nose: Secondary | ICD-10-CM

## 2019-05-04 DIAGNOSIS — C44319 Basal cell carcinoma of skin of other parts of face: Secondary | ICD-10-CM

## 2019-05-04 NOTE — Telephone Encounter (Signed)
We referred patient to skin surgery ctr.  Tawanna Sat from skin surgery ctr. needs office notes on patient. fax [phone]. Chart [id number]

## 2019-05-04 NOTE — Telephone Encounter (Signed)
Office visit notes faxed over to the [facility].

## 2019-05-04 NOTE — Telephone Encounter (Signed)
-----   Message from Melissa Monarch, MD sent at 05/01/2019 10:55 AM EDT ----- Inform pt that Dr. Darene Lamer feels each of these may have much more "root" under skin than above and best cure=Mohs; ?Dr. Velia Meyer

## 2019-05-04 NOTE — Telephone Encounter (Signed)
Phone call to patient with her Pathology results and ST's recommendations.  Patient aware of results and ST's recommendations.  MOH's information faxed to [place].

## 2019-05-06 ENCOUNTER — Telehealth: Payer: Self-pay

## 2019-05-06 NOTE — Telephone Encounter (Signed)
Fax from [place] to notify us that the patient has an appointment on April 6,2021 at 10:30am with Dr. Link Snuffer in [place].

## 2019-05-12 ENCOUNTER — Encounter: Payer: Self-pay | Admitting: Family Medicine

## 2019-05-12 DIAGNOSIS — C44311 Basal cell carcinoma of skin of nose: Secondary | ICD-10-CM | POA: Diagnosis not present

## 2019-05-20 ENCOUNTER — Encounter: Payer: Self-pay | Admitting: Family Medicine

## 2019-05-20 ENCOUNTER — Other Ambulatory Visit: Payer: Self-pay

## 2019-05-20 ENCOUNTER — Ambulatory Visit (INDEPENDENT_AMBULATORY_CARE_PROVIDER_SITE_OTHER): Payer: Medicare Other | Admitting: Family Medicine

## 2019-05-20 VITALS — BP 160/90 | Temp 94.4°F | Wt 117.4 lb

## 2019-05-20 DIAGNOSIS — M4800 Spinal stenosis, site unspecified: Secondary | ICD-10-CM | POA: Diagnosis not present

## 2019-05-20 DIAGNOSIS — I10 Essential (primary) hypertension: Secondary | ICD-10-CM | POA: Diagnosis not present

## 2019-05-20 MED ORDER — IMVEXXY MAINTENANCE PACK 4 MCG VA INST: 4.0000 ug | VAGINAL_INSERT | Freq: Every day | VAGINAL | 1 refills | Status: DC | PRN

## 2019-05-20 MED ORDER — METOPROLOL TARTRATE 50 MG PO TABS: ORAL_TABLET | ORAL | 1 refills | Status: DC

## 2019-05-20 MED ORDER — LOSARTAN POTASSIUM 50 MG PO TABS: ORAL_TABLET | ORAL | 1 refills | Status: DC

## 2019-05-20 NOTE — Progress Notes (Signed)
   Subjective:    Patient ID: [name], female    DOB: [birth year], 79 y.o.   MRN: [id number]  Hypertension This is a chronic problem. There are no compliance problems.    Pt would like to discuss what is going on with her back. Pt states it looks like that she is going to need back surgery and/or spinal infusions. pt has appt next week with neurosurgeon. Pt does stretches before getting up to help her walk normal   Due to have a surg for spinal stenosis soon   Very painful in the morn, needs stretching exrcises and helps a little but not a lot  Due to see dr Saintclair Halsted  Next wek at [place] neursurg  Blood pressure medicine and blood pressure levels reviewed today with patient. Compliant with blood pressure medicine. States does not miss a dose. No obvious side effects. Blood pressure generally good when checked elsewhere. Watching salt intake.  Patient still quite active.  Exercising within her capabilities   Pt has area of concern on back of right leg. Pt has [place] removed from nose last week.    Review of Systems Ongoing back pain.  No headache no chest pain no shortness of breath    Objective:   Physical Exam   Alert active no acute distress.  Blood pressure on repeat improved.  HEENT normal lungs clear heart rate and rhythm.  Some discomfort with standing.  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  Hypertension.  Good control discussed compliance discussed  3.  Severe spinal stenosis.  Patient facing surgery.  With severe level stenosis and symptoms certainly agree with this  Follow-up in 6 months diet exercise discussed.  Medications refilled

## 2019-05-26 ENCOUNTER — Other Ambulatory Visit: Payer: Self-pay | Admitting: Neurosurgery

## 2019-05-26 DIAGNOSIS — M4316 Spondylolisthesis, lumbar region: Secondary | ICD-10-CM | POA: Diagnosis not present

## 2019-05-26 DIAGNOSIS — I10 Essential (primary) hypertension: Secondary | ICD-10-CM | POA: Diagnosis not present

## 2019-06-01 ENCOUNTER — Ambulatory Visit
Admission: RE | Admit: 2019-06-01 | Discharge: 2019-06-01 | Disposition: A | Discharge status: Home / Self Care | Payer: Medicare Other | Source: Ambulatory Visit | Attending: Neurosurgery | Admitting: Neurosurgery

## 2019-06-01 ENCOUNTER — Telehealth: Payer: Self-pay | Admitting: Family Medicine

## 2019-06-01 DIAGNOSIS — M4317 Spondylolisthesis, lumbosacral region: Secondary | ICD-10-CM | POA: Diagnosis not present

## 2019-06-01 DIAGNOSIS — M4316 Spondylolisthesis, lumbar region: Secondary | ICD-10-CM

## 2019-06-01 MED ORDER — METHYLPREDNISOLONE ACETATE 40 MG/ML INJ SUSP (RADIOLOG
120.0000 mg | Freq: Once | INTRAMUSCULAR | Status: AC
  Administered 2019-06-01: 120 mg via EPIDURAL

## 2019-06-01 MED ORDER — IOPAMIDOL (ISOVUE-M 200) INJECTION 41%
1.0000 mL | Freq: Once | INTRAMUSCULAR | Status: AC
  Administered 2019-06-01: 1 mL via EPIDURAL

## 2019-06-01 NOTE — Telephone Encounter (Signed)
Pt would like to know if she should get a follow up scan on her thyroid nodule in June or if she should follow up with Dr. Wilburn Cornelia first.   She is aware Dr. Richardson Landry is not in office this week and would be responding to message when he returns.   She states Dr. Richardson Landry normally orders scan and if any change she follows up with other doctor.

## 2019-06-01 NOTE — Discharge Instructions (Signed)

## 2019-06-07 NOTE — Telephone Encounter (Signed)
The last scan, done last august, the radiologist did not recommend any further u s due to stability and appearance of nodules. If pt requests yet another, I would recommend at least not doing until the one year mark of august

## 2019-06-08 DIAGNOSIS — C44319 Basal cell carcinoma of skin of other parts of face: Secondary | ICD-10-CM | POA: Diagnosis not present

## 2019-06-08 NOTE — Telephone Encounter (Signed)
Pt contacted and states that she was really just wanting to know Dr.Steve opinion on getting another ultrasound. Informed pt that on last scan, radiologist states no further ultrasound due to stability. Pt verbalized understanding.   Pt wanted to let Dr.Steve know that she had a final steroid injection done at [facility] and so far seems to be helping.   Pt is needing a copy of her bone density scan that she had done last year sent to Dr.Cram at [facility]. Pt had bone density done at [facility]. Erica, do we have a copy of this that we can send over? Please advise. Thank you

## 2019-06-10 ENCOUNTER — Other Ambulatory Visit: Payer: Self-pay

## 2019-06-10 ENCOUNTER — Ambulatory Visit: Payer: Medicare Other | Admitting: Dermatology

## 2019-06-10 ENCOUNTER — Encounter: Payer: Self-pay | Admitting: Dermatology

## 2019-06-10 DIAGNOSIS — D485 Neoplasm of uncertain behavior of skin: Secondary | ICD-10-CM

## 2019-06-12 ENCOUNTER — Encounter: Payer: Self-pay | Admitting: Cardiology

## 2019-06-12 ENCOUNTER — Other Ambulatory Visit: Payer: Self-pay

## 2019-06-12 ENCOUNTER — Ambulatory Visit: Payer: Medicare Other | Admitting: Cardiology

## 2019-06-12 VITALS — BP 190/90 | HR 63 | Temp 98.2°F | Ht 60.0 in | Wt 118.0 lb

## 2019-06-12 DIAGNOSIS — I10 Essential (primary) hypertension: Secondary | ICD-10-CM

## 2019-06-12 DIAGNOSIS — R002 Palpitations: Secondary | ICD-10-CM

## 2019-06-12 NOTE — Patient Instructions (Signed)
Medication Instructions:  Your physician recommends that you continue on your current medications as directed. Please refer to the Current Medication list given to you today.  *If you need a refill on your cardiac medications before your next appointment, please call your pharmacy*   Lab Work: None today If you have labs (blood work) drawn today and your tests are completely normal, you will receive your results only by: . MyChart Message (if you have MyChart) OR . A paper copy in the mail If you have any lab test that is abnormal or we need to change your treatment, we will call you to review the results.   Testing/Procedures: None today   Follow-Up: At [facility], you and your health needs are our priority.  As part of our continuing mission to provide you with exceptional heart care, we have created designated Provider Care Teams.  These Care Teams include your primary Cardiologist (physician) and Advanced Practice Providers (APPs -  Physician Assistants and Nurse Practitioners) who all work together to provide you with the care you need, when you need it.  We recommend signing up for the patient portal called "MyChart".  Sign up information is provided on this After Visit Summary.  MyChart is used to connect with patients for Virtual Visits (Telemedicine).  Patients are able to view lab/test results, encounter notes, upcoming appointments, etc.  Non-urgent messages can be sent to your provider as well.   To learn more about what you can do with MyChart, go to [email or web address].com.    Your next appointment:   12 month(s)  The format for your next appointment:   In Person  Provider:   Samuel McDowell, MD   Other Instructions None       Thank you for choosing [facility] !         

## 2019-06-12 NOTE — Progress Notes (Signed)
Cardiology Office Note  Date: 06/12/2019   ID: [name], DOB [birth year], MRN [id number]  PCP:  Mikey Kirschner, MD  Cardiologist:  Rozann Lesches, MD Electrophysiologist:  None   Chief Complaint  Patient presents with  . Cardiac follow-up    History of Present Illness: [name] is a 79 y.o. female last assessed via telehealth encounter in May 2020.  She presents for a routine follow-up visit.  She states that over the last year she has not been bothered by any significant sense of palpitations, no syncopal events.  She has had other health changes, recently with resection of skin cancers.  She continues to follow with Dr. Wolfgang Phoenix, will be staying in that same practice after his retirement.  Blood pressure was significantly elevated when she presented today, she has trouble with whitecoat hypertension.  I rechecked her blood pressure finding it to be 152/88.  We also reviewed some of her home blood pressure checks.  She continues on Lopressor and Cozaar.  Past Medical History:  Diagnosis Date  . Arthritis   . Basal cell carcinoma 04/29/2019   right inner cheek [facility])  . Basal cell carcinoma 04/29/2019   left side nose ([facility])  . Diverticulosis of colon   . Essential hypertension   . History of basal cell carcinoma (BCC) excision    02-10-2013  left cheek  s/p  moh's sx  . History of palpitations   . History of recurrent UTIs   . History of syncope    2015-- felt to vasovagal response to pain (per cardiologist note)  . IBS (irritable bowel syndrome)   . Open wound of left knee    warm soaks daily /  neosprin and dressing daily / per per still has drainage  . Patellar bursitis of left knee    pre-patella septic bursitis w/ foreign body  . Right thyroid nodule    x2 right side incidental finding on carotid duplex 03/ 2018-- thyroid ultrasound done , benign , follow-up in one year  . Wears hearing aid in both ears     Past Surgical History:  Procedure  Laterality Date  . APPENDECTOMY  age 74  . BREAST BIOPSY  1962   benign  . CATARACT EXTRACTION W/PHACO  06/25/2011   Procedure: CATARACT EXTRACTION PHACO AND INTRAOCULAR LENS PLACEMENT (IOC);  Surgeon: Williams Che, MD;  Location: [facility];  Service: Ophthalmology;  Laterality: Right;  CDE: 8.90  . CATARACT EXTRACTION W/PHACO Left 05/05/2012   Procedure: CATARACT EXTRACTION PHACO AND INTRAOCULAR LENS PLACEMENT (IOC);  Surgeon: Williams Che, MD;  Location: [facility];  Service: Ophthalmology;  Laterality: Left;  CDE 12.78  . COLONOSCOPY  last one 11-26-2012  . EXCISION MORTON'S NEUROMA Right 2003  approx.  . INCISION AND DRAINAGE WOUND WITH FOREIGN BODY REMOVAL Left 08/30/2016   Procedure: LEFT KNEE INCISION AND DRAINAGE WOUND WITH FOREIGN BODY REMOVAL;  Surgeon: Sydnee Cabal, MD;  Location: [place];  Service: Orthopedics;  Laterality: Left;  . KNEE ARTHROPLASTY Right 03/14/2017   Procedure: RIGHT TOTAL KNEE ARTHROPLASTY WITH COMPUTER NAVIGATION AND REMOVAL OF HARDWARE;  Surgeon: Rod Can, MD;  Location: [facility];  Service: Orthopedics;  Laterality: Right;  Adductor Block  . KNEE ARTHROSCOPY W/ ACL RECONSTRUCTION Right 1996  . KNEE CARTILAGE SURGERY Right age 26  . MOHS SURGERY  02/10/2013   left cheek -- BCC  . POSTERIOR REPAIR  05-21-2002   dr Glo Herring   symptomatic recetocele  . TONSILLECTOMY  AND ADENOIDECTOMY  age 7  . TRANSTHORACIC ECHOCARDIOGRAM  04-17-2016  dr Domenic Polite   ef 60-65%/  trivial MR/ mild TR  . VAGINAL HYSTERECTOMY  1979   w/  Bilateral Salpingoophorectomy    Current Outpatient Medications  Medication Sig Dispense Refill  . Estradiol (IMVEXXY MAINTENANCE PACK) 4 MCG INST Place 4 mcg vaginally daily as needed. Use every 3 -4 days 24 each 1  . Flaxseed, Linseed, (FLAX PO) Take 1 Dose by mouth every morning.     Marland Kitchen losartan (COZAAR) 50 MG tablet TAKE (1) TABLET DAILY IN THE MORNING. 90 tablet 1  . metoprolol tartrate (LOPRESSOR) 50 MG tablet TAKE  (1) TABLET TWICE DAILY. 180 tablet 1  . Multiple Vitamins-Minerals (CENTRUM SILVER) tablet Take 1 tablet by mouth daily.    . OMEGA 3 1000 MG CAPS Take 1,000 mg by mouth at bedtime.     . phenazopyridine (AZO-STANDARD) 95 MG tablet Take 95-190 mg by mouth once as needed for pain.    . Probiotic Product (PROBIOTIC DAILY PO) Take 1 tablet by mouth daily.    . Psyllium (METAMUCIL PO) Take 1 Scoop by mouth every evening.     . sodium chloride (OCEAN) 0.65 % SOLN nasal spray Place 1 spray into both nostrils daily as needed for congestion.     No current facility-administered medications for this visit.   Allergies:  Codeine and Penicillins   ROS:  Please see the history of present illness. Otherwise, complete review of systems is positive for back pain.  All other systems are reviewed and negative.   Physical Exam: VS:  BP (!) 190/90   Pulse 63   Temp 98.2 F (36.8 C)   Ht 5' (1.524 m)   Wt 118 lb (53.5 kg)   LMP  (LMP Unknown)   SpO2 100%   BMI 23.05 kg/m , BMI Body mass index is 23.05 kg/m.  Wt Readings from Last 3 Encounters:  06/12/19 118 lb (53.5 kg)  05/20/19 117 lb 6.4 oz (53.3 kg)  03/17/19 116 lb 9.6 oz (52.9 kg)    General: Elderly woman, appears comfortable at rest. HEENT: Conjunctiva and lids normal, wearing a mask. Neck: Supple, no elevated JVP or carotid bruits, no thyromegaly. Lungs: Clear to auscultation, nonlabored breathing at rest. Cardiac: Regular rate and rhythm, no S3 or significant systolic murmur, no pericardial rub. Abdomen: Soft, bowel sounds present, no guarding or rebound. Extremities: No pitting edema, distal pulses 2+.  ECG:  An ECG dated 06/05/2017 was personally reviewed today and demonstrated:  Normal sinus rhythm.  Recent Labwork:    Component Value Date/Time   CHOL 226 (H) 02/04/2018 0904   TRIG 71 02/04/2018 0904   HDL 104 02/04/2018 0904   CHOLHDL 2.2 02/04/2018 0904   CHOLHDL 2.8 01/22/2013 0803   VLDL 13 01/22/2013 0803   LDLCALC 108  (H) 02/04/2018 0904    Other Studies Reviewed Today:  Echocardiogram 04/17/2016: Study Conclusions  - Left ventricle: The cavity size was normal. Wall thickness was normal. Systolic function was normal. The estimated ejection fraction was in the range of 60% to 65%. Wall motion was normal; there were no regional wall motion abnormalities. Diastolic dysfunction, grade indeterminate. Normal filling pressures. - Mitral valve: Mildly to moderately calcified annulus. - Tricuspid valve: There was mild regurgitation.  Assessment and Plan:  1.  History of palpitations, no significant recurrence over the last year.  Continue beta-blocker and observation.  ECG today shows sinus bradycardia with decreased R wave progression.  2.  Essential hypertension, remains on Cozaar and Lopressor.  Concerned that trend may be increasing although she does have component of whitecoat hypertension.  Keep follow-up with PCP.  May be worth consider switching from Cozaar to Hyzaar.  Medication Adjustments/Labs and Tests Ordered: Current medicines are reviewed at length with the patient today.  Concerns regarding medicines are outlined above.   Tests Ordered: Orders Placed This Encounter  Procedures  . EKG 12-Lead    Medication Changes: No orders of the defined types were placed in this encounter.   Disposition:  Follow up 1 year in the [place] office.  Signed, Satira Sark, MD, [facility] 06/12/2019 1:30 PM    [place] [facility] [address] [place], [place] [address] Phone: [phone]; Fax: [phone]

## 2019-06-14 NOTE — Progress Notes (Signed)
   Follow-Up Visit   Subjective  [name] is a 79 y.o. female who presents for the following: Skin Problem (Patient here today for spot behind right knee x  44month. Patient denies bleeding, patient states that the spot did have a white head to it, no drainage.).  Growth Location: Back of right knee Duration: 1 month Quality: Larger Associated Signs/Symptoms: Pain Modifying Factors:  Severity:  Timing: Context: History of skin cancer  The following portions of the chart were reviewed this encounter and updated as appropriate:     Objective  Well appearing patient in no apparent distress; mood and affect are within normal limits.  All sun exposed areas plus back examined.   Assessment & Plan  Neoplasm of uncertain behavior of skin Right Popliteal Fossa  Skin / nail biopsy Type of biopsy: tangential   Informed consent: discussed and consent obtained   Timeout: patient name, date of birth, surgical site, and procedure verified   Anesthesia: the lesion was anesthetized in a standard fashion   Anesthetic:  1% lidocaine w/ epinephrine 1-100,000 local infiltration Instrument used: flexible razor blade   Hemostasis achieved with: aluminum chloride   Outcome: patient tolerated procedure well   Post-procedure details: wound care instructions given

## 2019-06-17 ENCOUNTER — Encounter: Payer: Self-pay | Admitting: *Deleted

## 2019-06-26 DIAGNOSIS — M4316 Spondylolisthesis, lumbar region: Secondary | ICD-10-CM | POA: Diagnosis not present

## 2019-07-21 ENCOUNTER — Encounter: Payer: Self-pay | Admitting: Dermatology

## 2019-07-21 ENCOUNTER — Other Ambulatory Visit: Payer: Self-pay

## 2019-07-21 ENCOUNTER — Ambulatory Visit: Payer: Medicare Other | Admitting: Dermatology

## 2019-07-21 DIAGNOSIS — Z85828 Personal history of other malignant neoplasm of skin: Secondary | ICD-10-CM | POA: Diagnosis not present

## 2019-07-21 DIAGNOSIS — L821 Other seborrheic keratosis: Secondary | ICD-10-CM

## 2019-07-21 DIAGNOSIS — L570 Actinic keratosis: Secondary | ICD-10-CM

## 2019-07-21 NOTE — Progress Notes (Signed)
   Follow-Up Visit   Subjective  [name] is a 79 y.o. female who presents for the following: Skin Problem (right cheek x less than a month, right inner upper arm x 1 month or less, right inner calf -dark and recent, right inner calf- recheck red spot, left mid back).  New spots Location: Face arm leg back Duration:  Quality:  Associated Signs/Symptoms: Modifying Factors:  Severity:  Timing: Context: History of basal cell skin cancer  The following portions of the chart were reviewed this encounter and updated as appropriate:     Objective  Well appearing patient in no apparent distress; mood and affect are within normal limits.  All sun exposed areas plus back examined. Plus legs.   Assessment & Plan  Follow-up for [facility].  She is 1 month post removal by Mohs surgery with Dr. Link Snuffer of the lesion on her right inner cheek.  The scar is somewhat bumpy but is completely along the natural fold line and should improve with time.  There is a new subtle waxy bump on the mid cheek along with a volcano-like warty growth on the right inner arm.  Each of these will be biopsied after [name] returns from an upcoming trip to [facility].  The keratosis on her left upper shin is safe to watch.  The red very subtly waxy spot on the right lower shin is stable and she understands that it is likely that within 10 or 20 years this will be a little skin cancer that will not harm her.  On her right side just below a small lipoma is a 1.5 cm tan textured benign keratosis.  The keratosis on her left lower scapula is quite a bit darker but definitely not in the mole/melanoma grouping.

## 2019-07-22 ENCOUNTER — Other Ambulatory Visit: Payer: Self-pay | Admitting: Family Medicine

## 2019-07-22 NOTE — Telephone Encounter (Signed)
pls schedule for appt in 3 months and labs, cbc, cmp, lipids.   Thx,  Dr. Darene Lamer

## 2019-07-23 ENCOUNTER — Other Ambulatory Visit: Payer: Self-pay | Admitting: Family Medicine

## 2019-07-23 DIAGNOSIS — Z13220 Encounter for screening for lipoid disorders: Secondary | ICD-10-CM

## 2019-07-23 DIAGNOSIS — I10 Essential (primary) hypertension: Secondary | ICD-10-CM

## 2019-07-23 DIAGNOSIS — Z79899 Other long term (current) drug therapy: Secondary | ICD-10-CM

## 2019-07-23 NOTE — Telephone Encounter (Signed)
Labs orders placed and mailed to patient. Also placed note on lab orders to contact office to schedule appt.

## 2019-08-22 ENCOUNTER — Encounter: Payer: Self-pay | Admitting: Dermatology

## 2019-09-02 ENCOUNTER — Ambulatory Visit: Payer: Medicare Other | Admitting: Dermatology

## 2019-09-09 ENCOUNTER — Telehealth: Payer: Self-pay | Admitting: Family Medicine

## 2019-09-09 NOTE — Telephone Encounter (Signed)
Patient notified that there are active labs already ordered in [place]. Patient will have labs done before appt with Dr Lovena Le.

## 2019-09-09 NOTE — Telephone Encounter (Signed)
Patient has appt in September for 6 mos f/u and she says she has to have labwork done prior.  Needs order put in for [facility]. (Husband also has a message for labwork)

## 2019-09-22 ENCOUNTER — Other Ambulatory Visit: Payer: Self-pay | Admitting: Family Medicine

## 2019-09-25 ENCOUNTER — Other Ambulatory Visit: Payer: Self-pay | Admitting: Neurosurgery

## 2019-09-25 DIAGNOSIS — M4316 Spondylolisthesis, lumbar region: Secondary | ICD-10-CM

## 2019-10-02 ENCOUNTER — Other Ambulatory Visit: Payer: Self-pay

## 2019-10-02 ENCOUNTER — Ambulatory Visit
Admission: RE | Admit: 2019-10-02 | Discharge: 2019-10-02 | Disposition: A | Discharge status: Home / Self Care | Payer: Medicare Other | Source: Ambulatory Visit | Attending: Neurosurgery | Admitting: Neurosurgery

## 2019-10-02 DIAGNOSIS — M4316 Spondylolisthesis, lumbar region: Secondary | ICD-10-CM

## 2019-10-02 MED ORDER — IOPAMIDOL (ISOVUE-M 200) INJECTION 41%
1.0000 mL | Freq: Once | INTRAMUSCULAR | Status: AC
  Administered 2019-10-02: 1 mL via EPIDURAL

## 2019-10-02 MED ORDER — METHYLPREDNISOLONE ACETATE 40 MG/ML INJ SUSP (RADIOLOG
120.0000 mg | Freq: Once | INTRAMUSCULAR | Status: AC
  Administered 2019-10-02: 120 mg via EPIDURAL

## 2019-10-02 NOTE — Discharge Instructions (Signed)

## 2019-10-29 DIAGNOSIS — Z79899 Other long term (current) drug therapy: Secondary | ICD-10-CM | POA: Diagnosis not present

## 2019-10-29 DIAGNOSIS — I10 Essential (primary) hypertension: Secondary | ICD-10-CM | POA: Diagnosis not present

## 2019-10-29 DIAGNOSIS — Z13220 Encounter for screening for lipoid disorders: Secondary | ICD-10-CM | POA: Diagnosis not present

## 2019-10-30 LAB — LIPID PANEL
Chol/HDL Ratio: 2.6 ratio (ref 0.0–4.4)
Cholesterol, Total: 247 mg/dL — ABNORMAL HIGH (ref 100–199)
HDL: 96 mg/dL (ref >39)
LDL Chol Calc (NIH): 136 mg/dL — ABNORMAL HIGH (ref 0–99)
Triglycerides: 91 mg/dL (ref 0–149)
VLDL Cholesterol Cal: 15 mg/dL (ref 5–40)

## 2019-10-30 LAB — COMPREHENSIVE METABOLIC PANEL
ALT: 19 IU/L (ref 0–32)
AST: 23 IU/L (ref 0–40)
Albumin/Globulin Ratio: 2.6 — ABNORMAL HIGH (ref 1.2–2.2)
Albumin: 5.1 g/dL — ABNORMAL HIGH (ref 3.7–4.7)
Alkaline Phosphatase: 58 IU/L (ref 44–121)
BUN/Creatinine Ratio: 13 (ref 12–28)
BUN: 10 mg/dL (ref 8–27)
Bilirubin Total: 0.5 mg/dL (ref 0.0–1.2)
CO2: 26 mmol/L (ref 20–29)
Calcium: 10.4 mg/dL — ABNORMAL HIGH (ref 8.7–10.3)
Chloride: 98 mmol/L (ref 96–106)
Creatinine, Ser: 0.78 mg/dL (ref 0.57–1.00)
GFR calc Af Amer: 83 mL/min/{1.73_m2} (ref >59)
GFR calc non Af Amer: 72 mL/min/{1.73_m2} (ref >59)
Globulin, Total: 2 g/dL (ref 1.5–4.5)
Glucose: 88 mg/dL (ref 65–99)
Potassium: 4.4 mmol/L (ref 3.5–5.2)
Sodium: 139 mmol/L (ref 134–144)
Total Protein: 7.1 g/dL (ref 6.0–8.5)

## 2019-10-30 LAB — CBC WITH DIFFERENTIAL/PLATELET
Basophils Absolute: 0 10*3/uL (ref 0.0–0.2)
Basos: 1 %
EOS (ABSOLUTE): 0.1 10*3/uL (ref 0.0–0.4)
Eos: 3 %
Hematocrit: 45.6 % (ref 34.0–46.6)
Hemoglobin: 15.1 g/dL (ref 11.1–15.9)
Immature Grans (Abs): 0 10*3/uL (ref 0.0–0.1)
Immature Granulocytes: 0 %
Lymphocytes Absolute: 1.3 10*3/uL (ref 0.7–3.1)
Lymphs: 31 %
MCH: 33.4 pg — ABNORMAL HIGH (ref 26.6–33.0)
MCHC: 33.1 g/dL (ref 31.5–35.7)
MCV: 101 fL — ABNORMAL HIGH (ref 79–97)
Monocytes Absolute: 0.6 10*3/uL (ref 0.1–0.9)
Monocytes: 14 %
Neutrophils Absolute: 2.2 10*3/uL (ref 1.4–7.0)
Neutrophils: 51 %
Platelets: 246 10*3/uL (ref 150–450)
RBC: 4.52 x10E6/uL (ref 3.77–5.28)
RDW: 12.2 % (ref 11.7–15.4)
WBC: 4.4 10*3/uL (ref 3.4–10.8)

## 2019-11-03 ENCOUNTER — Encounter: Payer: Self-pay | Admitting: Family Medicine

## 2019-11-03 ENCOUNTER — Other Ambulatory Visit: Payer: Self-pay

## 2019-11-03 ENCOUNTER — Ambulatory Visit (INDEPENDENT_AMBULATORY_CARE_PROVIDER_SITE_OTHER): Payer: Medicare Other | Admitting: Family Medicine

## 2019-11-03 VITALS — BP 146/94 | HR 61 | Temp 97.8°F | Resp 20 | Ht 60.0 in | Wt 114.0 lb

## 2019-11-03 DIAGNOSIS — I10 Essential (primary) hypertension: Secondary | ICD-10-CM | POA: Diagnosis not present

## 2019-11-03 DIAGNOSIS — I499 Cardiac arrhythmia, unspecified: Secondary | ICD-10-CM

## 2019-11-03 DIAGNOSIS — Z23 Encounter for immunization: Secondary | ICD-10-CM | POA: Diagnosis not present

## 2019-11-03 DIAGNOSIS — M8580 Other specified disorders of bone density and structure, unspecified site: Secondary | ICD-10-CM | POA: Diagnosis not present

## 2019-11-03 DIAGNOSIS — E782 Mixed hyperlipidemia: Secondary | ICD-10-CM

## 2019-11-03 MED ORDER — LOSARTAN POTASSIUM 50 MG PO TABS: ORAL_TABLET | ORAL | 1 refills | Status: DC

## 2019-11-03 MED ORDER — METOPROLOL TARTRATE 50 MG PO TABS: 50.0000 mg | ORAL_TABLET | Freq: Two times a day (BID) | ORAL | 1 refills | Status: DC

## 2019-11-03 NOTE — Progress Notes (Signed)
Patient ID: [name], female    DOB: [birth year], 79 y.o.   MRN: [id number]   Chief Complaint  Patient presents with  . Establish Care   Subjective:    HPI  pt arrives to establish care.   Pt concerned about taking beta blocker causing raynauds syndrome and cause fatigue.   Discuss bone density.   Would like senior flu vaccine today.   H/o spinal stenosis since 1996.  Seeing neurosurgeon. Pt said needing surgery. They were hesitant at her age. And concern of vertebrate not ablet to tolerate surgery. Spinal injections, last one 1 mo ago. 2nd one not as good. Doing yoga 2x per week and strong core. And yoga stetches daily. occ has some low back pain.  Has been in farming for 26s. Lifting lots of things daily.  Mri-lumbar in 3/21- severe spinal stenosis, L3-L5.  Neural foramin narrowing L3-L5.  1st injection pain free for weeks and 2nd one didn't help so much. Pt stating is over working at times.  Doing lots of yard work.  Lumbar- Bilateral- stenosis, on mri.  Calcium 67mcg and taking vit D3, 50 mcg., 2000 IU, and calcium 1200mg . And multivitamin too.  dexa scan q 2 yrs.  Has osteopenia. Due 8/22 for dexa scan.  hld- taking omega daily and good HDL.  [facility] next month [facility]  Medical History [name] has a past medical history of Arthritis, Basal cell carcinoma (04/29/2019), Basal cell carcinoma (04/29/2019), Diverticulosis of colon, Essential hypertension, History of basal cell carcinoma (BCC) excision, History of palpitations, History of recurrent UTIs, History of syncope, IBS (irritable bowel syndrome), Open wound of left knee, Patellar bursitis of left knee, Right thyroid nodule, and Wears hearing aid in both ears.   Outpatient Encounter Medications as of 11/03/2019  Medication Sig  . Flaxseed, Linseed, (FLAX PO) Take 1 Dose by mouth every morning.   Marland Kitchen losartan (COZAAR) 50 MG tablet TAKE 1 TABLET BY MOUTH EVERY MORNING.  . metoprolol  tartrate (LOPRESSOR) 50 MG tablet Take 1 tablet (50 mg total) by mouth 2 (two) times daily.  . Multiple Vitamins-Minerals (CENTRUM SILVER) tablet Take 1 tablet by mouth daily.  . OMEGA 3 1000 MG CAPS Take 1,000 mg by mouth at bedtime.   Marland Kitchen OVER THE COUNTER MEDICATION Calcium 600mg  one daily Vitamin D 2,000 iu daily  . phenazopyridine (AZO-STANDARD) 95 MG tablet Take 95-190 mg by mouth once as needed for pain.  . Probiotic Product (PROBIOTIC DAILY PO) Take 1 tablet by mouth daily.  . Psyllium (METAMUCIL PO) Take 1 Scoop by mouth every evening.   . sodium chloride (OCEAN) 0.65 % SOLN nasal spray Place 1 spray into both nostrils daily as needed for congestion.  . [DISCONTINUED] losartan (COZAAR) 50 MG tablet TAKE 1 TABLET BY MOUTH EVERY MORNING.  . [DISCONTINUED] metoprolol tartrate (LOPRESSOR) 50 MG tablet TAKE (1) TABLET TWICE DAILY.  . [DISCONTINUED] Estradiol (IMVEXXY MAINTENANCE PACK) 4 MCG INST Place 4 mcg vaginally daily as needed. Use every 3 -4 days (Patient not taking: Reported on 11/03/2019)   No facility-administered encounter medications on file as of 11/03/2019.     Review of Systems  Constitutional: Negative for chills and fever.  HENT: Negative for congestion, rhinorrhea and sore throat.   Respiratory: Negative for cough, shortness of breath and wheezing.   Cardiovascular: Negative for chest pain and leg swelling.  Gastrointestinal: Negative for abdominal pain, diarrhea, nausea and vomiting.  Genitourinary: Negative for dysuria and frequency.  Musculoskeletal: Negative for  arthralgias and back pain.  Skin: Negative for rash.  Neurological: Negative for dizziness, weakness and headaches.     Vitals BP (!) 146/94   Pulse 61   Temp 97.8 F (36.6 C)   Resp 20   Ht 5' (1.524 m)   Wt 114 lb (51.7 kg)   LMP  (LMP Unknown)   SpO2 94%   BMI 22.26 kg/m   Objective:   Physical Exam Vitals and nursing note reviewed.  Constitutional:      Appearance: Normal appearance.    HENT:     Head: Normocephalic and atraumatic.     Nose: Nose normal.     Mouth/Throat:     Mouth: Mucous membranes are moist.     Pharynx: Oropharynx is clear.  Eyes:     Extraocular Movements: Extraocular movements intact.     Conjunctiva/sclera: Conjunctivae normal.     Pupils: Pupils are equal, round, and reactive to light.  Cardiovascular:     Rate and Rhythm: Normal rate and regular rhythm.     Pulses: Normal pulses.     Heart sounds: Normal heart sounds.  Pulmonary:     Effort: Pulmonary effort is normal.     Breath sounds: Normal breath sounds. No wheezing, rhonchi or rales.  Musculoskeletal:        General: Normal range of motion.     Right lower leg: No edema.     Left lower leg: No edema.  Skin:    General: Skin is warm and dry.     Findings: No lesion or rash.  Neurological:     General: No focal deficit present.     Mental Status: She is alert and oriented to person, place, and time.  Psychiatric:        Mood and Affect: Mood normal.        Behavior: Behavior normal.      Assessment and Plan   1. Mixed hyperlipidemia  2. Need for vaccination - Flu Vaccine QUAD High Dose(Fluad)  3. Osteopenia, unspecified location - DG Bone Density; Future  4. Arrhythmia, long term - metoprolol tartrate (LOPRESSOR) 50 MG tablet; Take 1 tablet (50 mg total) by mouth 2 (two) times daily.  Dispense: 180 tablet; Refill: 1  5. Essential hypertension - losartan (COZAAR) 50 MG tablet; TAKE 1 TABLET BY MOUTH EVERY MORNING.  Dispense: 90 tablet; Refill: 1 - metoprolol tartrate (LOPRESSOR) 50 MG tablet; Take 1 tablet (50 mg total) by mouth 2 (two) times daily.  Dispense: 180 tablet; Refill: 1   htn- suboptimal. Cont to monitor bp, pt has some white coat hypertension. Has improved compared to last visit at [facility], 190/90. Recommend staying on lopressor since has arrhythmia for reason it was started.  Pt getting flu vacc today.   F/u 35mo or prn.

## 2019-11-04 ENCOUNTER — Telehealth: Payer: Self-pay | Admitting: Dermatology

## 2019-11-04 ENCOUNTER — Ambulatory Visit: Payer: Medicare Other | Admitting: Dermatology

## 2019-11-04 DIAGNOSIS — Z1283 Encounter for screening for malignant neoplasm of skin: Secondary | ICD-10-CM | POA: Diagnosis not present

## 2019-11-04 DIAGNOSIS — D485 Neoplasm of uncertain behavior of skin: Secondary | ICD-10-CM

## 2019-11-04 DIAGNOSIS — L820 Inflamed seborrheic keratosis: Secondary | ICD-10-CM | POA: Diagnosis not present

## 2019-11-04 DIAGNOSIS — D0461 Carcinoma in situ of skin of right upper limb, including shoulder: Secondary | ICD-10-CM

## 2019-11-04 DIAGNOSIS — C44722 Squamous cell carcinoma of skin of right lower limb, including hip: Secondary | ICD-10-CM

## 2019-11-04 DIAGNOSIS — C4492 Squamous cell carcinoma of skin, unspecified: Secondary | ICD-10-CM

## 2019-11-04 DIAGNOSIS — L430 Hypertrophic lichen planus: Secondary | ICD-10-CM | POA: Diagnosis not present

## 2019-11-04 HISTORY — DX: Squamous cell carcinoma of skin, unspecified: C44.92

## 2019-11-04 MED ORDER — MUPIROCIN CALCIUM 2 % EX CREA: TOPICAL_CREAM | CUTANEOUS | 1 refills | Status: DC

## 2019-11-04 MED ORDER — MUPIROCIN 2 % EX OINT: 1.0000 "application " | TOPICAL_OINTMENT | Freq: Two times a day (BID) | CUTANEOUS | 2 refills | Status: DC

## 2019-11-04 NOTE — Telephone Encounter (Signed)
[name] at [facility] left message on office voice mail saying that when patient's prescription for Bactroban 2% cream was sent through insurance that the copay was extremely high.  Per [name] if prescriptioin is changed to Bactroban 2% OINTMENT patient's copay is much less.

## 2019-11-04 NOTE — Patient Instructions (Signed)

## 2019-11-04 NOTE — Telephone Encounter (Signed)
Re sent in mupirocin 2% ointment.

## 2019-11-05 ENCOUNTER — Telehealth: Payer: Self-pay

## 2019-11-05 NOTE — Telephone Encounter (Signed)
Patient made apt Wed 6th for Bone Density at [facility] and needs prior Authorization. Pt seend Dr Lovena Le on 09/28  Pt call back [phone]

## 2019-11-05 NOTE — Telephone Encounter (Signed)
[facility] stated the patient needs their order form filled out and they are going to fax it over and we need to have it filled out by doctor and faxed back before patient's appt.

## 2019-11-06 NOTE — Telephone Encounter (Signed)
Form in doctor's office for completion

## 2019-11-09 ENCOUNTER — Telehealth: Payer: Self-pay | Admitting: *Deleted

## 2019-11-09 ENCOUNTER — Encounter: Payer: Self-pay | Admitting: *Deleted

## 2019-11-09 NOTE — Telephone Encounter (Signed)
Pathology to patient, surgery appointment scheduled.  

## 2019-11-09 NOTE — Telephone Encounter (Signed)
done

## 2019-11-09 NOTE — Telephone Encounter (Signed)
-----   Message from Lavonna Monarch, MD sent at 11/06/2019  7:46 PM EDT ----- Schedule as 30 minutes surgery with Dr. Darene Lamer for biopsies #2+ #3

## 2019-11-11 DIAGNOSIS — M85851 Other specified disorders of bone density and structure, right thigh: Secondary | ICD-10-CM | POA: Diagnosis not present

## 2019-11-11 DIAGNOSIS — M85852 Other specified disorders of bone density and structure, left thigh: Secondary | ICD-10-CM | POA: Diagnosis not present

## 2019-11-13 ENCOUNTER — Encounter: Payer: Self-pay | Admitting: Family Medicine

## 2019-11-30 ENCOUNTER — Telehealth: Payer: Self-pay

## 2019-11-30 NOTE — Telephone Encounter (Signed)
Results discussed with patient. Patient advised per Dr Lovena Le: Says osteopenia.  Recommending f/u in 2 yrs.   Cont vit d and calcium supplements. And weight bearing exercises 3-4x per week.  Patient verbalized understanding.

## 2019-11-30 NOTE — Telephone Encounter (Signed)
Do you know if it was faxed to [place]?  Dr.Vaanya Shambaugh

## 2019-11-30 NOTE — Telephone Encounter (Signed)
Found it,   Says osteopenia.  Recommending f/u in 2 yrs.   Cont vit d and calcium supplements. And weight bearing exercises 3-4x per week.  Dr. Lovena Le

## 2019-11-30 NOTE — Telephone Encounter (Signed)
Pt is wanting result on Bone Density test to please call Pt   Call back [phone]

## 2019-12-09 ENCOUNTER — Ambulatory Visit: Payer: Medicare Other | Admitting: Dermatology

## 2019-12-09 ENCOUNTER — Other Ambulatory Visit: Payer: Self-pay

## 2019-12-09 DIAGNOSIS — S81801A Unspecified open wound, right lower leg, initial encounter: Secondary | ICD-10-CM

## 2019-12-09 DIAGNOSIS — L570 Actinic keratosis: Secondary | ICD-10-CM | POA: Diagnosis not present

## 2019-12-09 DIAGNOSIS — Z1283 Encounter for screening for malignant neoplasm of skin: Secondary | ICD-10-CM | POA: Diagnosis not present

## 2019-12-09 DIAGNOSIS — Z85828 Personal history of other malignant neoplasm of skin: Secondary | ICD-10-CM

## 2019-12-09 NOTE — Patient Instructions (Addendum)
°  Phone call in 2 weeks to let Dr.Tafeen know how the wound is healing.

## 2019-12-10 NOTE — Progress Notes (Signed)
   Follow-Up Visit   Subjective  [name] is a 79 y.o. female who presents for the following: Follow-up (Re check spots on legs, inside right arm, and outer upper right arm. Possible biopsy per patient. ).  Follow up Location: legs and arms Duration:  Quality:  Associated Signs/Symptoms: Modifying Factors:  Severity:  Timing: Context:   Objective  Well appearing patient in no apparent distress; mood and affect are within normal limits.  All skin waist up examined.  Plus legs.   Assessment & Plan    Neoplasm of uncertain behavior of skin (4) Right Upper Arm - Posterior  Skin / nail biopsy Type of biopsy: tangential   Informed consent: discussed and consent obtained   Timeout: patient name, date of birth, surgical site, and procedure verified   Procedure prep:  Patient was prepped and draped in usual sterile fashion (Non sterile) Prep type:  Chlorhexidine Anesthesia: the lesion was anesthetized in a standard fashion   Anesthetic:  1% lidocaine w/ epinephrine 1-100,000 local infiltration Instrument used: flexible razor blade   Outcome: patient tolerated procedure well   Post-procedure details: wound care instructions given    Specimen 1 - Surgical pathology Differential Diagnosis: scc vs bcc Check Margins: No  Right Upper Arm - Anterior  Skin / nail biopsy Type of biopsy: tangential   Informed consent: discussed and consent obtained   Timeout: patient name, date of birth, surgical site, and procedure verified   Procedure prep:  Patient was prepped and draped in usual sterile fashion (Non sterile) Prep type:  Chlorhexidine Anesthesia: the lesion was anesthetized in a standard fashion   Anesthetic:  1% lidocaine w/ epinephrine 1-100,000 local infiltration Instrument used: flexible razor blade   Outcome: patient tolerated procedure well   Post-procedure details: wound care instructions given    Specimen 2 - Surgical pathology Differential Diagnosis: scc vs  bcc Check Margins: No  Right Lower Leg - Anterior superior  Skin / nail biopsy Type of biopsy: tangential   Informed consent: discussed and consent obtained   Timeout: patient name, date of birth, surgical site, and procedure verified   Procedure prep:  Patient was prepped and draped in usual sterile fashion (Non sterile) Prep type:  Chlorhexidine Anesthesia: the lesion was anesthetized in a standard fashion   Anesthetic:  1% lidocaine w/ epinephrine 1-100,000 local infiltration Instrument used: flexible razor blade   Outcome: patient tolerated procedure well   Post-procedure details: wound care instructions given    Specimen 3 - Surgical pathology Differential Diagnosis: scc vs bcc Check Margins: No  Right Lower Leg - Anterior inferior  Skin / nail biopsy Type of biopsy: tangential   Informed consent: discussed and consent obtained   Timeout: patient name, date of birth, surgical site, and procedure verified   Procedure prep:  Patient was prepped and draped in usual sterile fashion (Non sterile) Prep type:  Chlorhexidine Anesthesia: the lesion was anesthetized in a standard fashion   Anesthetic:  1% lidocaine w/ epinephrine 1-100,000 local infiltration Instrument used: flexible razor blade   Outcome: patient tolerated procedure well   Post-procedure details: wound care instructions given    Specimen 4 - Surgical pathology Differential Diagnosis: scc vs bcc Check Margins: No  Encounter for screening for malignant neoplasm of skin Mid Back  Yearly skin exams.      I, Lavonna Monarch, MD, have reviewed all documentation for this visit.  The documentation on 12/10/19 for the exam, diagnosis, procedures, and orders are all accurate and complete.

## 2019-12-11 ENCOUNTER — Encounter: Payer: Self-pay | Admitting: Dermatology

## 2019-12-16 ENCOUNTER — Encounter: Payer: Self-pay | Admitting: Dermatology

## 2019-12-16 NOTE — Progress Notes (Signed)
   Follow-Up Visit   Subjective  [name] is a 79 y.o. female who presents for the following: Annual Exam (Concerns right lower leg wound. ).  Injury right leg would like other spots checked Location:  Duration:  Quality: Improving Associated Signs/Symptoms: Modifying Factors:  Severity:  Timing: Context:   Objective  Well appearing patient in no apparent distress; mood and affect are within normal limits.  A focused examination was performed including Head, neck, upper chest, back, arms, legs.. Relevant physical exam findings are noted in the Assessment and Plan.   Assessment & Plan    History of basal cell carcinoma (BCC) of skin Left Buccal Cheek   Annual skin exam  Screening for malignant neoplasm of skin Mid Back  Yearly skin exams.  Wound of right lower extremity, initial encounter Right Lower Leg - Anterior  No treatment needed at this time. Patient is doing good home wound care. If something changes she can call with any concerns. Patient can keep leg elevated when sitting down.   AK (actinic keratosis) (3) Right Ear; Right Malar Cheek; Left Anterior Neck  Destruction of lesion - Left Anterior Neck, Right Ear, Right Malar Cheek Complexity: simple   Destruction method: cryotherapy   Informed consent: discussed and consent obtained   Timeout:  patient name, date of birth, surgical site, and procedure verified Lesion destroyed using liquid nitrogen: Yes   Cryotherapy cycles:  5 Outcome: patient tolerated procedure well with no complications       I, Lavonna Monarch, MD, have reviewed all documentation for this visit.  The documentation on 12/16/19 for the exam, diagnosis, procedures, and orders are all accurate and complete.

## 2020-01-21 ENCOUNTER — Encounter: Payer: Medicare Other | Admitting: Dermatology

## 2020-02-18 DIAGNOSIS — M4316 Spondylolisthesis, lumbar region: Secondary | ICD-10-CM | POA: Diagnosis not present

## 2020-02-18 DIAGNOSIS — I10 Essential (primary) hypertension: Secondary | ICD-10-CM | POA: Diagnosis not present

## 2020-02-18 DIAGNOSIS — Z1231 Encounter for screening mammogram for malignant neoplasm of breast: Secondary | ICD-10-CM | POA: Diagnosis not present

## 2020-02-27 DIAGNOSIS — N3001 Acute cystitis with hematuria: Secondary | ICD-10-CM | POA: Diagnosis not present

## 2020-03-01 ENCOUNTER — Other Ambulatory Visit: Payer: Self-pay | Admitting: *Deleted

## 2020-03-01 ENCOUNTER — Telehealth: Payer: Self-pay | Admitting: *Deleted

## 2020-03-01 MED ORDER — CEFPROZIL 500 MG PO TABS: ORAL_TABLET | ORAL | 1 refills | Status: DC

## 2020-03-01 MED ORDER — CEFPROZIL 500 MG PO TABS
ORAL_TABLET | ORAL | 1 refills | Status: DC
Start: 2020-03-01 — End: 2020-04-15

## 2020-03-01 NOTE — Telephone Encounter (Signed)
Pt states she has a standing order for cefzil 500mg . Takes one tablet after sexual activity. Last seen 11/03/19 to est care.   [facility] [place].

## 2020-03-01 NOTE — Telephone Encounter (Signed)
Called and discussed with pt. Pt then states she was out of town last Saturday and went [facility]. Was having pressure in bladder and frequency. Prescribed cipro for 3 days. States symptoms are gone now but got a call that urine culture came back and was told antibiotic needed to be changed. She asked that they faxed over u/a and urine culture to dr taylor since she was back home and could not really understand what they were saying on the phone to her. I got the fax and put in your folder for you to review.

## 2020-03-02 NOTE — Telephone Encounter (Signed)
Discussed with pt. Pt verbalized understanding.  °

## 2020-03-02 NOTE — Telephone Encounter (Signed)
On review of the culture the cipro is sensitive to the bacteria she had in urine.  If she's feeling better then doesn't need another antibiotic. If symptoms return then call the office.   Thx.   Dr. Lovena Le

## 2020-03-17 ENCOUNTER — Other Ambulatory Visit: Payer: Self-pay

## 2020-03-17 ENCOUNTER — Ambulatory Visit (INDEPENDENT_AMBULATORY_CARE_PROVIDER_SITE_OTHER): Payer: Medicare Other | Admitting: Dermatology

## 2020-03-17 DIAGNOSIS — Z85828 Personal history of other malignant neoplasm of skin: Secondary | ICD-10-CM | POA: Diagnosis not present

## 2020-03-17 DIAGNOSIS — L729 Follicular cyst of the skin and subcutaneous tissue, unspecified: Secondary | ICD-10-CM | POA: Diagnosis not present

## 2020-03-17 DIAGNOSIS — Z8589 Personal history of malignant neoplasm of other organs and systems: Secondary | ICD-10-CM

## 2020-03-17 NOTE — Patient Instructions (Signed)
[name]. Blanchie Dessert, MD  ([id number])  [facility] [place] [address]  [phone]

## 2020-03-20 ENCOUNTER — Encounter: Payer: Self-pay | Admitting: Dermatology

## 2020-03-20 NOTE — Progress Notes (Signed)
   Follow-Up Visit   Subjective  [name] is a 80 y.o. female who presents for the following: Procedure (Here for treatment- right upper arm- anterior- cis x 1 & right lower leg anterior superior- KA).  Skin cancers arm and leg (biopsy 11/04/2019) Location:  Duration:  Quality:  Associated Signs/Symptoms: Modifying Factors:  Severity:  Timing: Context: Also noted bump on ring finger  Objective  Well appearing patient in no apparent distress; mood and affect are within normal limits. Objective  Right Lower Leg - Anterior, Right Upper Arm - Anterior: No sign residual; suspect biopsy might have been curative.  Objective  Left Distal 4th Finger: Firm deep dermal 4 mm cyst over the dorsal DIP joint; pseudomyxoid cyst    A focused examination was performed including Head, neck, arm, leg, back.. Relevant physical exam findings are noted in the Assessment and Plan.   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 [name] will think this over and let me know your preference.      I, Melissa Monarch, MD, have reviewed all documentation for this visit.  The documentation on 03/20/20 for the exam, diagnosis, procedures, and orders are all accurate and complete.

## 2020-03-21 ENCOUNTER — Other Ambulatory Visit: Payer: Self-pay | Admitting: Family Medicine

## 2020-03-21 DIAGNOSIS — I10 Essential (primary) hypertension: Secondary | ICD-10-CM

## 2020-03-25 ENCOUNTER — Encounter: Payer: Self-pay | Admitting: Nurse Practitioner

## 2020-03-25 ENCOUNTER — Ambulatory Visit (INDEPENDENT_AMBULATORY_CARE_PROVIDER_SITE_OTHER): Payer: Medicare Other | Admitting: Nurse Practitioner

## 2020-03-25 ENCOUNTER — Other Ambulatory Visit: Payer: Self-pay

## 2020-03-25 VITALS — BP 154/90 | Temp 97.6°F | Ht 60.0 in | Wt 118.0 lb

## 2020-03-25 DIAGNOSIS — I10 Essential (primary) hypertension: Secondary | ICD-10-CM | POA: Insufficient documentation

## 2020-03-25 MED ORDER — HYDROCHLOROTHIAZIDE 25 MG PO TABS: 25.0000 mg | ORAL_TABLET | Freq: Every day | ORAL | 0 refills | Status: DC

## 2020-03-25 NOTE — Progress Notes (Addendum)
Subjective:    Patient ID: [name], female    DOB: April 22, 1940, 80 y.o.   MRN: [id number]  HPI Patient presents today for evaluation of hypertension.  She has had some elevated BP readings at home since January 11th.  She says generally she feels she has 'white coat syndrome' but her BP readings have lately been high even at home. She says her BP typically runs 130s/70s, but her BP for example this morning was 153/80.  She also reports BP reading lower in the right arm than left.  She is concerned because her mother (who passed in 2003 at age 26) also had high BP and ended up having several strokes.  Specifically last night she says she was having a very intense, vivid dream, and then woke up but was disoriented for about a minute just temporarily in the middle of the night.  She thought about checking her BP but did not want to wake her husband up so she went back to bed and checked it this morning.  She is very active and does yoga and back stretches for about 15-20 minutes every morning.  She rarely eats out and there have been no changes in her diet.  Denies vision changes, loss of balance, numbness, tingling, weakness, headaches.  Denies dizziness or syncope.  Denies SOB or CP.  No difficulty speaking or swallowing. She does have a bit of an increase in stress as she follows a neurosurgeon for back issues and has been told she is not a candidate for back surgery at this time, knows she will be managing chronic back pain for life. 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    Patient does follow with Dr. Domenic Polite for management of an irregular HR and last saw him in May of 2021, where he made a note that patient had some hypertensive readings in the office and may need to add HCTZ to her medications for better BP control.  She did have a carotid US in May of 2018.     Review  of Systems  Constitutional: Negative for diaphoresis and fatigue.  HENT: Negative for trouble swallowing.   Respiratory: Negative for shortness of breath.   Cardiovascular: Negative for chest pain, palpitations and leg swelling.  Neurological: Negative for dizziness, syncope, speech difficulty, weakness, light-headedness, numbness and headaches.       Objective:   Physical Exam Constitutional:      General: She is not in acute distress.    Appearance: Normal appearance. She is normal weight.  Eyes:     Extraocular Movements: Extraocular movements intact.     Pupils: Pupils are equal, round, and reactive to light.  Cardiovascular:     Rate and Rhythm: Normal rate and regular rhythm.     Comments: No bruit, thrill palpated or ausculated in carotids BIL.  R carotid pulse +2, L carotid pulse +4.  Skin:    General: Skin is warm and dry.  Neurological:     General: No focal deficit present.     Mental Status: She is alert.     Gait: Gait normal.     Deep Tendon Reflexes: Reflexes normal.     Comments: Romberg test negative.  Muscle strength 5/5 BIL upper and lower extremities.    Psychiatric:        Mood and Affect: Mood normal.  Behavior: Behavior normal.        Thought Content: Thought content normal.        Judgment: Judgment normal.    Vitals:   03/25/20 1049  BP: (!) 154/90  Temp: 97.6 F (36.4 C)   EKG: NSR.   Rechecked BP L arm 160/85, R arm 155/95 sitting.        Assessment & Plan:   Problem List Items Addressed This Visit      Cardiovascular and Mediastinum   Essential hypertension - Primary   Relevant Medications   hydrochlorothiazide (HYDRODIURIL) 25 MG tablet   Other Relevant Orders   EKG 12-Lead (Completed)     Meds ordered this encounter  Medications  . hydrochlorothiazide (HYDRODIURIL) 25 MG tablet    Sig: Take 1 tablet (25 mg total) by mouth daily. For BP    Dispense:  90 tablet    Refill:  0    Please deliver medication to patient     Order Specific Question:   Supervising Provider    Answer:   Sallee Lange A [[id number]]    Continue current BP medications as directed.  Discussed back pain and stress possible effect on increasing BP.  As BP have been reading high for an extended period of time and considering the patient's maternal history of stroke and high blood pressure, as well as Dr. Myles Gip note, we will prescribe 25 mg of HCTZ for Mrs. Lunsford.  She has just gotten the Losartan refilled and once that is gone we can switch to Hyzaar.  Patient states she was unable to be seen at Dr. Myles Gip office until June, will refer her to Bunnie Domino at the [facility] cardiology office for a sooner appointment to follow up with hypertension and changes in BP. Continue to monitor BP at home, record and bring to cardiology visit.  Warning signs reviewed. Seek help sooner if any problems.  Return for Routine Follow-up as scheduled. 30 minutes was spent with the patient including previsit chart review, time spent with patient, discussion of health issues, review of data including medical record, and documentation of the visit.

## 2020-03-25 NOTE — Progress Notes (Signed)
   Subjective:    Patient ID: [name], female    DOB: Apr 23, 1940, 80 y.o.   MRN: [id number]      Review of Systems     Objective:   Physical Exam        Assessment & Plan:  30 minutes was spent with the patient including previsit chart review, time spent with patient, discussion of health issues, review of data including medical record, and documentation of the visit.

## 2020-03-26 ENCOUNTER — Encounter: Payer: Self-pay | Admitting: Nurse Practitioner

## 2020-04-06 NOTE — Progress Notes (Signed)
Cardiology Office Note   Date:  04/08/2020   ID:  [name], DOB [birth year], MRN [id number]  PCP:  Erven Colla, DO  Cardiologist: Dr. Domenic Polite CC: Hypertension follow up   History of Present Illness: [name] is a 80 y.o. female who presents at the request of Pearson Forster, FNP, for evaluation of blood pressure management.  She was seen in the office on 03/25/2020 by Mrs. Hoskins and [employer] student NP where she complained of increasing blood pressure.  She reported that her blood pressure was reading high over an extended period of time.  Due to her family history of stroke in her mother and hypertension, and review of her cardiologist note from Dr. Domenic Polite, it was recommended that 25 mg of HCTZ would be added to her medication regimen, which included losartan 50 mg daily, metoprolol tartrate 50 mg twice a day she was not able to have a follow-up with Dr. Domenic Polite until June 2022 and therefore requested a sooner appointment.    It was also noted that she had different blood pressures in her arms on their evaluation.  There were no documentations of her blood pressures in each arm that I am able to review.  Blood pressure documented in the right arm 154/90, left arm 160/85.  Recheck of right arm 155/95.  She comes today slightly nervous.  She has been taking her blood pressures at home which range between 153/83-112/69.  Blood pressures are inconsistent in both arms.  She admits to whitecoat syndrome.  She is a former Designer, fashion/clothing.   Past Medical History:  Diagnosis Date  . Arthritis   . Basal cell carcinoma 04/29/2019   right inner cheek [facility])  . Basal cell carcinoma 04/29/2019   left side nose (MOHs)  . Diverticulosis of colon   . Essential hypertension   . History of basal cell carcinoma (BCC) excision    02-10-2013  left cheek  s/p  moh's sx  . History of palpitations   . History of recurrent UTIs   . History of syncope    2015-- felt to vasovagal response to  pain (per cardiologist note)  . IBS (irritable bowel syndrome)   . Open wound of left knee    warm soaks daily /  neosprin and dressing daily / per per still has drainage  . Patellar bursitis of left knee    pre-patella septic bursitis w/ foreign body  . Right thyroid nodule    x2 right side incidental finding on carotid duplex 03/ 2018-- thyroid ultrasound done , benign , follow-up in one year  . 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 Squamous cell carcinoma of skin 11/04/2019   KA-right lower leg-anterior superior  . Wears hearing aid in both ears     Past Surgical History:  Procedure Laterality Date  . APPENDECTOMY  age 25  . BREAST BIOPSY  1962   benign  . CATARACT EXTRACTION W/PHACO  06/25/2011   Procedure: CATARACT EXTRACTION PHACO AND INTRAOCULAR LENS PLACEMENT (IOC);  Surgeon: Williams Che, MD;  Location: [facility];  Service: Ophthalmology;  Laterality: Right;  CDE: 8.90  . CATARACT EXTRACTION W/PHACO Left 05/05/2012   Procedure: CATARACT EXTRACTION PHACO AND INTRAOCULAR LENS PLACEMENT (IOC);  Surgeon: Williams Che, MD;  Location: [facility];  Service: Ophthalmology;  Laterality: Left;  CDE 12.78  . COLONOSCOPY  last one 11-26-2012  . EXCISION MORTON'S NEUROMA Right 2003  approx.  . INCISION  AND DRAINAGE WOUND WITH FOREIGN BODY REMOVAL Left 08/30/2016   Procedure: LEFT KNEE INCISION AND DRAINAGE WOUND WITH FOREIGN BODY REMOVAL;  Surgeon: Sydnee Cabal, MD;  Location: [place];  Service: Orthopedics;  Laterality: Left;  . KNEE ARTHROPLASTY Right 03/14/2017   Procedure: RIGHT TOTAL KNEE ARTHROPLASTY WITH COMPUTER NAVIGATION AND REMOVAL OF HARDWARE;  Surgeon: Rod Can, MD;  Location: [facility];  Service: Orthopedics;  Laterality: Right;  Adductor Block  . KNEE ARTHROSCOPY W/ ACL RECONSTRUCTION Right 1996  . KNEE CARTILAGE SURGERY Right age 23  . MOHS SURGERY  02/10/2013   left cheek -- BCC  . POSTERIOR REPAIR   05-21-2002   dr Glo Herring   symptomatic recetocele  . TONSILLECTOMY AND ADENOIDECTOMY  age 73  . TRANSTHORACIC ECHOCARDIOGRAM  04-17-2016  dr Domenic Polite   ef 60-65%/  trivial MR/ mild TR  . VAGINAL HYSTERECTOMY  1979   w/  Bilateral Salpingoophorectomy     Current Outpatient Medications  Medication Sig Dispense Refill  . atorvastatin (LIPITOR) 20 MG tablet Take 1 tablet (20 mg total) by mouth daily. 90 tablet 3  . cefPROZIL (CEFZIL) 500 MG tablet Take 1 tab p.o. after intercourse prn. 20 tablet 1  . Flaxseed, Linseed, (FLAX PO) Take 1 Dose by mouth every morning.     . hydrochlorothiazide (HYDRODIURIL) 25 MG tablet Take 1 tablet (25 mg total) by mouth daily. For BP 90 tablet 0  . losartan (COZAAR) 50 MG tablet TAKE 1 TABLET BY MOUTH EVERY MORNING. 90 tablet 0  . metoprolol tartrate (LOPRESSOR) 50 MG tablet Take 1 tablet (50 mg total) by mouth 2 (two) times daily. 180 tablet 1  . Multiple Vitamins-Minerals (CENTRUM SILVER) tablet Take 1 tablet by mouth daily.    . OMEGA 3 1000 MG CAPS Take 1,000 mg by mouth at bedtime.     Marland Kitchen OVER THE COUNTER MEDICATION Calcium 600mg  one daily Vitamin D 2,000 iu daily    . Probiotic Product (PROBIOTIC DAILY PO) Take 1 tablet by mouth daily.    . Psyllium (METAMUCIL PO) Take 1 Scoop by mouth every evening.     . sodium chloride (OCEAN) 0.65 % SOLN nasal spray Place 1 spray into both nostrils daily as needed for congestion.     No current facility-administered medications for this visit.    Allergies:   Codeine and Penicillins    Social History:  The patient  reports that she quit smoking about 57 years ago. Her smoking use included cigarettes. She has a 5.00 pack-year smoking history. She has never used smokeless tobacco. She reports current alcohol use of about 14.0 standard drinks of alcohol per week. She reports that she does not use drugs.   Family History:  The patient's family history includes CVA in her mother; Hypertension in her mother; Throat  cancer in her father.    ROS: All other systems are reviewed and negative. Unless otherwise mentioned in H&P    PHYSICAL EXAM: VS:  BP (!) 150/100   Pulse 67   Ht 5' (1.524 m)   Wt 119 lb (54 kg)   LMP  (LMP Unknown)   SpO2 97%   BMI 23.24 kg/m  , BMI Body mass index is 23.24 kg/m. GEN: Well nourished, well developed, in no acute distress HEENT: normal Neck: no JVD, carotid bruits, or masses Cardiac: RRR; no murmurs, rubs, or gallops,no edema  Respiratory:  Clear to auscultation bilaterally, normal work of breathing GI: soft, nontender, nondistended, + BS MS: no deformity or  atrophy Skin: warm and dry, no rash Neuro:  Strength and sensation are intact Psych: euthymic mood, full affect   EKG: Not completed this office visit  Recent Labs: 10/29/2019: ALT 19; BUN 10; Creatinine, Ser 0.78; Hemoglobin 15.1; Platelets 246; Potassium 4.4; Sodium 139    Lipid Panel    Component Value Date/Time   CHOL 247 (H) 10/29/2019 0822   TRIG 91 10/29/2019 0822   HDL 96 10/29/2019 0822   CHOLHDL 2.6 10/29/2019 0822   CHOLHDL 2.8 01/22/2013 0803   VLDL 13 01/22/2013 0803   LDLCALC 136 (H) 10/29/2019 0822      Wt Readings from Last 3 Encounters:  04/08/20 119 lb (54 kg)  03/25/20 118 lb (53.5 kg)  11/03/19 114 lb (51.7 kg)      Other studies Reviewed: Echocardiogram 05-02-2016 Left ventricle: The cavity size was normal. Wall thickness was  normal. Systolic function was normal. The estimated ejection  fraction was in the range of 60% to 65%. Wall motion was normal;  there were no regional wall motion abnormalities. Diastolic  dysfunction, grade indeterminate. Normal filling pressures.  - Mitral valve: Mildly to moderately calcified annulus.  - Tricuspid valve: There was mild regurgitation.    ASSESSMENT AND PLAN:  1.  Hypertension: We will repeat echocardiogram is 1 has not been completed since 2018, also check carotid Dopplers as last carotid Dopplers did show some  minimal plaque noted bilaterally, they will also be looking at subclavian arteries during that evaluation, to rule out subclavian steal.  She would like this to be completed at [facility] as it is closer for her.  She does state on multiple occasions that she is "just like my mother" as her mother had 2 strokes and she is worried about having one.  I have advised her if her blood pressure remains elevated this is a high risk for CVA.  But I want to make sure that these blood pressures are accurate.  I have rechecked her blood pressure in the office, and found it to be 188/88, and 192/88 respectively.  I will not however increase her medications at this time as it is vastly different from her home blood pressures which she brings with her.  I have asked her to follow-up with primary care and bring her blood pressure machine with her to correlate with blood pressures in their office.  I have given her education on low-sodium diet, to include "salty 6", asked her to take her blood pressures twice a day at the same time every day and bring those with her to follow-up appointment with Dr. Domenic Polite and with Pearson Forster, FNP.  If blood pressure is truly rising may need to consider other testing to include cardiac catheterization, renal artery ultrasound, or other testing at the discretion of Dr. Domenic Polite.     She will follow-up with Dr. Domenic Polite on August 03, 2020.  2.  Hyperlipidemia: I will start her on atorvastatin 20 mg daily.  She was found to have some plaque in her carotid arteries and her LDL was 108 on labs on 02/04/2018.  Follow-up lipids and LFTs will need to be drawn in 6 weeks.  Current medicines are reviewed at length with the patient today.  I have spent 25 minutes dedicated to the care of this patient on the date of this encounter to include pre-visit review of records, assessment, management and diagnostic testing,with shared decision making.  Labs/ tests ordered today include:  Carotid ultrasound, echocardiogram.  (Labs were completed by  PCP)  Phill Myron. West Pugh, ANP, AACC   04/08/2020 12:39 PM    [facility] [address] Office [phone] Fax [phone]  Notice: This dictation was prepared with Dragon dictation along with smaller phrase technology. Any transcriptional errors that result from this process are unintentional and may not be corrected upon review.

## 2020-04-08 ENCOUNTER — Ambulatory Visit: Payer: Medicare Other | Admitting: Adult Health

## 2020-04-08 ENCOUNTER — Encounter: Payer: Self-pay | Admitting: Adult Health

## 2020-04-08 ENCOUNTER — Other Ambulatory Visit: Payer: Self-pay

## 2020-04-08 VITALS — BP 150/100 | HR 67 | Ht 60.0 in | Wt 119.0 lb

## 2020-04-08 DIAGNOSIS — I10 Essential (primary) hypertension: Secondary | ICD-10-CM | POA: Diagnosis not present

## 2020-04-08 DIAGNOSIS — I6523 Occlusion and stenosis of bilateral carotid arteries: Secondary | ICD-10-CM | POA: Diagnosis not present

## 2020-04-08 MED ORDER — ATORVASTATIN CALCIUM 20 MG PO TABS: 20.0000 mg | ORAL_TABLET | Freq: Every day | ORAL | 3 refills | Status: DC

## 2020-04-08 NOTE — Patient Instructions (Signed)
Medication Instructions:  START- Atorvastatin 20 mg by mouth daily  *If you need a refill on your cardiac medications before your next appointment, please call your pharmacy*   Lab Work: None ordered   Testing/Procedures: Your physician has requested that you have an echocardiogram in [place]. Echocardiography is a painless test that uses sound waves to create images of your heart. It provides your doctor with information about the size and shape of your heart and how well your heart's chambers and valves are working. This procedure takes approximately one hour. There are no restrictions for this procedure.  Your physician has requested that you have a carotid duplex in [place]. This test is an ultrasound of the carotid arteries in your neck. It looks at blood flow through these arteries that supply the brain with blood. Allow one hour for this exam. There are no restrictions or special instructions.   Follow-Up: At [facility], you and your health needs are our priority.  As part of our continuing mission to provide you with exceptional heart care, we have created designated Provider Care Teams.  These Care Teams include your primary Cardiologist (physician) and Advanced Practice Providers (APPs -  Physician Assistants and Nurse Practitioners) who all work together to provide you with the care you need, when you need it.  We recommend signing up for the patient portal called "MyChart".  Sign up information is provided on this After Visit Summary.  MyChart is used to connect with patients for Virtual Visits (Telemedicine).  Patients are able to view lab/test results, encounter notes, upcoming appointments, etc.  Non-urgent messages can be sent to your provider as well.   To learn more about what you can do with MyChart, go to [email or web address].    Your next appointment:   1 month(s)  The format for your next appointment:   In Person  Provider:   [name], DNP,  ANP

## 2020-04-14 DIAGNOSIS — I10 Essential (primary) hypertension: Secondary | ICD-10-CM | POA: Diagnosis not present

## 2020-04-14 DIAGNOSIS — M4316 Spondylolisthesis, lumbar region: Secondary | ICD-10-CM | POA: Diagnosis not present

## 2020-04-15 ENCOUNTER — Encounter: Payer: Self-pay | Admitting: Nurse Practitioner

## 2020-04-15 ENCOUNTER — Ambulatory Visit (INDEPENDENT_AMBULATORY_CARE_PROVIDER_SITE_OTHER): Payer: Medicare Other | Admitting: Nurse Practitioner

## 2020-04-15 ENCOUNTER — Other Ambulatory Visit: Payer: Self-pay

## 2020-04-15 VITALS — BP 181/96 | HR 64 | Temp 97.5°F | Wt 117.4 lb

## 2020-04-15 DIAGNOSIS — I10 Essential (primary) hypertension: Secondary | ICD-10-CM | POA: Diagnosis not present

## 2020-04-15 MED ORDER — ALPRAZOLAM 0.25 MG PO TABS: ORAL_TABLET | ORAL | 0 refills | Status: DC

## 2020-04-15 NOTE — Progress Notes (Signed)
   Subjective:    Patient ID: [name], female    DOB: 06/03/1940, 80 y.o.   MRN: [id number]  HPI Pt here for follow up on cardiology visit. Pt went to cardiology on 04/08/20. Cardiology wanted her to check BP for a week. Pt is schedule for echo and ultrasound.  Patient presents for blood pressure checkup after her recent cardiology visit.  Has her BP log with her today-see scanned report.  Her blood pressures are back at baseline.  BP running 111-142/64-79.  Heart rate mainly in the 60s.  Has only noticed elevations during stressful times including office visits which she relates to whitecoat hypertension.  No chest pain/ischemic type pain shortness of breath.  States she does not have any symptoms during the times of her BP elevation.  Is unaware of high readings. See previous notes.   Review of Systems     Objective:   Physical Exam NAD.  Alert, oriented.  Cheerful affect.  Lungs clear.  Heart regular rate rhythm.  No murmur or gallop noted.  Carotids no bruits or thrills.  Lower extremities no edema. Today's Vitals   04/15/20 1539  BP: (!) 181/96  Pulse: 64  Temp: (!) 97.5 F (36.4 C)  SpO2: 100%  Weight: 117 lb 6.4 oz (53.3 kg)   Body mass index is 22.93 kg/m. Her blood pressure was checked along with her home monitor which was accurate.       Assessment & Plan:   Problem List Items Addressed This Visit      Cardiovascular and Mediastinum   Essential hypertension - Primary     Meds ordered this encounter  Medications  . ALPRAZolam (XANAX) 0.25 MG tablet    Sig: Take 1/2-1 tab po qd prn anxiety    Dispense:  20 tablet    Refill:  0    Order Specific Question:   Supervising Provider    Answer:   Sallee Lange A [[id number]]   Due to the home blood pressure readings as well as her pulse,  no changes in her blood pressure medications at this time.  Patient is scheduled for a carotid ultrasound and echocardiogram over the next 2 weeks.  A very low dose Xanax was  prescribed for patient to try when her pressure is running high to see if this will make an impact. Warning signs reviewed.  Follow-up with cardiology as planned.  Call back sooner or go to ED if needed.

## 2020-04-18 ENCOUNTER — Other Ambulatory Visit: Payer: Self-pay

## 2020-04-18 ENCOUNTER — Ambulatory Visit (HOSPITAL_COMMUNITY)
Admission: RE | Admit: 2020-04-18 | Discharge: 2020-04-18 | Disposition: A | Discharge status: Home / Self Care | Payer: Medicare Other | Source: Ambulatory Visit | Attending: Internal Medicine | Admitting: Internal Medicine

## 2020-04-18 DIAGNOSIS — I6523 Occlusion and stenosis of bilateral carotid arteries: Secondary | ICD-10-CM | POA: Insufficient documentation

## 2020-04-18 DIAGNOSIS — R2230 Localized swelling, mass and lump, unspecified upper limb: Secondary | ICD-10-CM | POA: Diagnosis not present

## 2020-04-18 DIAGNOSIS — M13849 Other specified arthritis, unspecified hand: Secondary | ICD-10-CM | POA: Diagnosis not present

## 2020-04-18 DIAGNOSIS — M79642 Pain in left hand: Secondary | ICD-10-CM | POA: Diagnosis not present

## 2020-04-19 ENCOUNTER — Encounter (HOSPITAL_COMMUNITY): Payer: Medicare Other

## 2020-04-20 ENCOUNTER — Telehealth: Payer: Self-pay | Admitting: Adult Health

## 2020-04-20 DIAGNOSIS — I10 Essential (primary) hypertension: Secondary | ICD-10-CM

## 2020-04-20 NOTE — Telephone Encounter (Signed)
Spoke with pt and made her aware that we are waiting on [name] to review these results.  Pt was concerned due to family hx of stroke.  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  States her BP has been fluctuating at times and recently woke up in the middle of the night confused about where she was.  Advised once Curt Bears reviews, we will be in contact.  Pt appreciative for call.

## 2020-04-20 NOTE — Telephone Encounter (Signed)
Patient states she has questions about her carotid test results.

## 2020-04-22 ENCOUNTER — Telehealth: Payer: Self-pay | Admitting: Adult Health

## 2020-04-22 ENCOUNTER — Telehealth: Payer: Self-pay

## 2020-04-22 NOTE — Telephone Encounter (Signed)
Pt aware of carotid results and order entered for renal ultrasound ./cy

## 2020-04-22 NOTE — Telephone Encounter (Signed)
Brooks Sailors, NP  04/20/2020 7:03 PM EDT      The study was normal. No need to change medications. Will need a renal ultrasound unless this was already ordered. KL

## 2020-04-22 NOTE — Telephone Encounter (Addendum)
Left a detailed voice message of results for the patient per DPR on file. Asked patient to give office a call if she has any questions and reminded her that she has an upcoming appointment and that someone from scheduling will give her a call about scheduling her renal ultrasound.   ----- Message from Lendon Colonel, NP sent at 04/20/2020  7:03 PM EDT ----- The study was normal.  No need to change medications. Will need a renal ultrasound unless this was already ordered. KL

## 2020-04-22 NOTE — Telephone Encounter (Signed)
Spoke with patient regarding renal doppler appointment scheduled 05/06/20 at 9:00 am--arrival time is 8:45 am for check in.  Patient voiced her understanding

## 2020-04-26 ENCOUNTER — Ambulatory Visit: Payer: Medicare Other | Admitting: Family Medicine

## 2020-04-29 ENCOUNTER — Ambulatory Visit (HOSPITAL_COMMUNITY)
Admission: RE | Admit: 2020-04-29 | Discharge: 2020-04-29 | Disposition: A | Discharge status: Home / Self Care | Payer: Medicare Other | Source: Ambulatory Visit | Attending: Adult Health | Admitting: Adult Health

## 2020-04-29 ENCOUNTER — Other Ambulatory Visit: Payer: Self-pay

## 2020-04-29 DIAGNOSIS — I340 Nonrheumatic mitral (valve) insufficiency: Secondary | ICD-10-CM

## 2020-04-29 DIAGNOSIS — I361 Nonrheumatic tricuspid (valve) insufficiency: Secondary | ICD-10-CM

## 2020-04-29 DIAGNOSIS — I10 Essential (primary) hypertension: Secondary | ICD-10-CM | POA: Diagnosis not present

## 2020-04-29 LAB — ECHOCARDIOGRAM COMPLETE
Area-P 1/2: 3.91 cm2
S' Lateral: 2.3 cm

## 2020-04-29 NOTE — Progress Notes (Signed)
*  PRELIMINARY RESULTS* Echocardiogram 2D Echocardiogram has been performed.  Samuel Germany 04/29/2020, 11:20 AM

## 2020-05-04 NOTE — Progress Notes (Signed)
Cardiology Office Note   Date:  05/06/2020   ID:  [name], DOB 1940-03-07, MRN [id number]  PCP:  Erven Colla, DO  Cardiologist:Dr. Doreatha Lew chief complaint on file.    History of Present Illness: [name] is a 80 y.o. female who presents for ongoing assessment and management of hypertension.  It was reported by her PCP that her blood pressure readings continue to be elevated and unequal blood pressures from right to left arm.  She has a family history of CVA and there was concern about carotid artery disease as well as subclavian steal syndrome.  Echocardiogram was ordered to evaluate for changes in LV function as well as a carotid Doppler ultrasound to evaluate for carotid artery disease and subclavian steal.  She was continued on her current medication regimen which included 25 mg of HCTZ and losartan 50 mg daily along with metoprolol 50 mg BID.  Carotid studies were completed on 04/18/2020 which demonstrated normal flow hemodynamics in the left subclavian artery with monophasic flow noted in the right subclavian artery and right innominate suggesting high-grade stenosis more proximally.  There was no evidence of stenosis in the right ICA, with extracranial vessels near normal with only minimal thickening or plaque and decreased velocities in the RCA compared to prior exam.  Left carotid velocities in the left ICA were consistent with 1 to 39% stenosis.  Decreased velocities in the LICA compared to prior exam.  Echocardiogram completed on 04/29/2020 revealed LVEF of 70 to 75% with left ventricle having hyperdynamic function.  No regional wall motion abnormalities were noted.  There were no valvular issues with the exception of mild mitral valve regurgitation.  She is scheduled to have a renal artery ultrasound which is to be completed on 05/06/2020 which will occur after this appointment.  She comes today with multiple questions concerning her blood pressure control and also  explanation concerning recent carotid and echocardiogram report.  She also brings a list of her recent blood pressures as well as her most recent labs concerning lipid profile.  She denies any symptoms of dizziness or excessive fatigue.  She does have some chronic back pain which she states is improved using yoga techniques.  Past Medical History:  Diagnosis Date  . Arthritis   . Basal cell carcinoma 04/29/2019   right inner cheek [facility])  . Basal cell carcinoma 04/29/2019   left side nose ([facility])  . Diverticulosis of colon   . Essential hypertension   . History of basal cell carcinoma (BCC) excision    02-10-2013  left cheek  s/p  moh's sx  . History of palpitations   . History of recurrent UTIs   . History of syncope    2015-- felt to vasovagal response to pain (per cardiologist note)  . IBS (irritable bowel syndrome)   . Open wound of left knee    warm soaks daily /  neosprin and dressing daily / per per still has drainage  . Patellar bursitis of left knee    pre-patella septic bursitis w/ foreign body  . Right thyroid nodule    x2 right side incidental finding on carotid duplex 03/ 2018-- thyroid ultrasound done , benign , follow-up in one year  . 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 Squamous cell carcinoma of skin 11/04/2019   KA-right lower leg-anterior superior  . Wears hearing aid in both ears     Past Surgical History:  Procedure  Laterality Date  . APPENDECTOMY  age 73  . BREAST BIOPSY  1962   benign  . CATARACT EXTRACTION W/PHACO  06/25/2011   Procedure: CATARACT EXTRACTION PHACO AND INTRAOCULAR LENS PLACEMENT (IOC);  Surgeon: Williams Che, MD;  Location: [facility];  Service: Ophthalmology;  Laterality: Right;  CDE: 8.90  . CATARACT EXTRACTION W/PHACO Left 05/05/2012   Procedure: CATARACT EXTRACTION PHACO AND INTRAOCULAR LENS PLACEMENT (IOC);  Surgeon: Williams Che, MD;  Location: [facility];  Service: Ophthalmology;   Laterality: Left;  CDE 12.78  . COLONOSCOPY  last one 11-26-2012  . EXCISION MORTON'S NEUROMA Right 2003  approx.  . INCISION AND DRAINAGE WOUND WITH FOREIGN BODY REMOVAL Left 08/30/2016   Procedure: LEFT KNEE INCISION AND DRAINAGE WOUND WITH FOREIGN BODY REMOVAL;  Surgeon: Sydnee Cabal, MD;  Location: [place];  Service: Orthopedics;  Laterality: Left;  . KNEE ARTHROPLASTY Right 03/14/2017   Procedure: RIGHT TOTAL KNEE ARTHROPLASTY WITH COMPUTER NAVIGATION AND REMOVAL OF HARDWARE;  Surgeon: Rod Can, MD;  Location: [facility];  Service: Orthopedics;  Laterality: Right;  Adductor Block  . KNEE ARTHROSCOPY W/ ACL RECONSTRUCTION Right 1996  . KNEE CARTILAGE SURGERY Right age 77  . MOHS SURGERY  02/10/2013   left cheek -- BCC  . POSTERIOR REPAIR  05-21-2002   dr Glo Herring   symptomatic recetocele  . TONSILLECTOMY AND ADENOIDECTOMY  age 12  . TRANSTHORACIC ECHOCARDIOGRAM  04-17-2016  dr Domenic Polite   ef 60-65%/  trivial MR/ mild TR  . VAGINAL HYSTERECTOMY  1979   w/  Bilateral Salpingoophorectomy     Current Outpatient Medications  Medication Sig Dispense Refill  . ALPRAZolam (XANAX) 0.25 MG tablet Take 1/2-1 tab po qd prn anxiety 20 tablet 0  . Flaxseed, Linseed, (FLAX PO) Take 1 Dose by mouth every morning.     . hydrochlorothiazide (HYDRODIURIL) 25 MG tablet Take 1 tablet (25 mg total) by mouth daily. For BP 90 tablet 0  . losartan (COZAAR) 50 MG tablet TAKE 1 TABLET BY MOUTH EVERY MORNING. 90 tablet 0  . metoprolol tartrate (LOPRESSOR) 50 MG tablet Take 1 tablet (50 mg total) by mouth 2 (two) times daily. 180 tablet 1  . Multiple Vitamins-Minerals (CENTRUM SILVER) tablet Take 1 tablet by mouth daily.    . OMEGA 3 1000 MG CAPS Take 1,000 mg by mouth at bedtime.     Marland Kitchen OVER THE COUNTER MEDICATION Calcium 600mg  one daily Vitamin D 2,000 iu daily    . Probiotic Product (PROBIOTIC DAILY PO) Take 1 tablet by mouth daily.    . Psyllium (METAMUCIL PO) Take 1 Scoop by mouth  every evening.     . sodium chloride (OCEAN) 0.65 % SOLN nasal spray Place 1 spray into both nostrils daily as needed for congestion.    Marland Kitchen atorvastatin (LIPITOR) 40 MG tablet Take 1 tablet (40 mg total) by mouth daily. 90 tablet 3   No current facility-administered medications for this visit.    Allergies:   Codeine and Penicillins    Social History:  The patient  reports that she quit smoking about 58 years ago. Her smoking use included cigarettes. She has a 5.00 pack-year smoking history. She has never used smokeless tobacco. She reports current alcohol use of about 14.0 standard drinks of alcohol per week. She reports that she does not use drugs.   Family History:  The patient's family history includes CVA in her mother; Hypertension in her mother; Throat cancer in her father.    ROS:  All other systems are reviewed and negative. Unless otherwise mentioned in H&P    PHYSICAL EXAM: VS:  BP (!) 148/76   Pulse 65   Ht 5' (1.524 m)   Wt 118 lb 6.4 oz (53.7 kg)   LMP  (LMP Unknown)   BMI 23.12 kg/m  , BMI Body mass index is 23.12 kg/m. GEN: Well nourished, well developed, in no acute distress HEENT: normal Neck: no JVD, carotid bruits, or masses Cardiac: RRR; no murmurs, rubs, or gallops,no edema  Respiratory:  Clear to auscultation bilaterally, normal work of breathing GI: soft, nontender, nondistended, + BS MS: no deformity or atrophy Skin: warm and dry, no rash Neuro:  Strength and sensation are intact Psych: euthymic mood, full affect   EKG: Not completed this office visit  Recent Labs: 10/29/2019: ALT 19; BUN 10; Creatinine, Ser 0.78; Hemoglobin 15.1; Platelets 246; Potassium 4.4; Sodium 139    Lipid Panel    Component Value Date/Time   CHOL 247 (H) 10/29/2019 0822   TRIG 91 10/29/2019 0822   HDL 96 10/29/2019 0822   CHOLHDL 2.6 10/29/2019 0822   CHOLHDL 2.8 01/22/2013 0803   VLDL 13 01/22/2013 0803   LDLCALC 136 (H) 10/29/2019 0822      Wt Readings from  Last 3 Encounters:  05/06/20 118 lb 6.4 oz (53.7 kg)  04/15/20 117 lb 6.4 oz (53.3 kg)  04/08/20 119 lb (54 kg)      Other studies Reviewed: Echocardiogram 05/19/20 1. Left ventricular ejection fraction, by estimation, is 70 to 75%. The  left ventricle has hyperdynamic function. The left ventricle has no  regional wall motion abnormalities. Left ventricular diastolic parameters  were normal.  2. Right ventricular systolic function is normal. The right ventricular  size is normal. There is normal pulmonary artery systolic pressure.  3. The mitral valve is normal in structure. Mild mitral valve  regurgitation.  4. The aortic valve is tricuspid. Aortic valve regurgitation is not  visualized. Mild aortic valve sclerosis is present, with no evidence of  aortic valve stenosis.  5. The inferior vena cava is normal in size with greater than 50%  respiratory variability, suggesting right atrial pressure of 3 mmHg.   Carotid Ultrasound 04/18/2020  Right Carotid: There is no evidence of stenosis in the right ICA. The  extracranial vessels were near-normal with only minimal  wall  thickening or plaque. Decrease velocities in the RICA  compared to prior exam.   Left Carotid: Velocities in the left ICA are consistent with a 1-39%  stenosis.Decrease velocities in the LICA compared to prior exam.   Vertebrals: Bilateral vertebral arteries demonstrate antegrade flow.  Subclavians: Normal flow hemodynamics were seen in the left subclavian  artery.  Monophasic flow noted in the right subclavian artery and  right innominate suggesting a high grade stenosis more proximally.   (I have reviewed this test result with Dr. Gwenlyn Found who is onsite at the [facility] office today for recommendations of any further treatment or referral concerning the right innominate artery stenosis.)  ASSESSMENT AND PLAN:  1.  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  She is of low risk for stroke with the exception of hypertension.  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  I have rechecked her blood pressure from initial blood pressure of 176/110 down to 148/76.  I have also told her that due to the stenosis noted in the  right innominate artery that it is best for her to check her blood pressures in the left arm only although there was no subclavian steal syndrome symptoms on the right it would be best to be consistent on the left arm blood pressure, so that there is no discrepancy with bilateral BPs.  I will add aspirin 81 mg daily to her regimen.   2.  Hypercholesterolemia: Most recent labs dated 10/29/2019 revealed a cholesterol of 247 HDL 96 triglycerides 91 with an LDL of 136.  This is not optimal and therefore I will will increase dose of atorvastatin from 20 mg daily to 40 mg daily.  She is due to see PCP in the next couple of months for annual physical.  Labs will be drawn then in order to assess her response to medication.   She will follow-up with Dr. Domenic Polite in 1 year for ongoing cardiology management.  Current medicines are reviewed at length with the patient today.  I have spent 25 minutes dedicated to the care of this patient on the date of this encounter to include pre-visit review of records, assessment, management and diagnostic testing,with shared decision making.  Labs/ tests ordered today include: None  Phill Myron. West Pugh, ANP, AACC   05/06/2020 11:13 AM    [address] Office [phone] Fax [phone]  Notice: This dictation was prepared with Dragon dictation along with smaller phrase technology. Any transcriptional errors that result from this process are unintentional and may not be corrected upon review.

## 2020-05-06 ENCOUNTER — Ambulatory Visit: Payer: Medicare Other | Admitting: Adult Health

## 2020-05-06 ENCOUNTER — Ambulatory Visit (HOSPITAL_COMMUNITY)
Admission: RE | Admit: 2020-05-06 | Discharge: 2020-05-06 | Disposition: A | Discharge status: Home / Self Care | Payer: Medicare Other | Source: Ambulatory Visit | Attending: Internal Medicine | Admitting: Internal Medicine

## 2020-05-06 ENCOUNTER — Encounter: Payer: Self-pay | Admitting: Adult Health

## 2020-05-06 ENCOUNTER — Encounter: Payer: Self-pay | Admitting: *Deleted

## 2020-05-06 ENCOUNTER — Other Ambulatory Visit: Payer: Self-pay

## 2020-05-06 VITALS — BP 148/76 | HR 65 | Ht 60.0 in | Wt 118.4 lb

## 2020-05-06 DIAGNOSIS — I771 Stricture of artery: Secondary | ICD-10-CM

## 2020-05-06 DIAGNOSIS — I10 Essential (primary) hypertension: Secondary | ICD-10-CM | POA: Insufficient documentation

## 2020-05-06 MED ORDER — ATORVASTATIN CALCIUM 40 MG PO TABS: 40.0000 mg | ORAL_TABLET | Freq: Every day | ORAL | 3 refills | Status: DC

## 2020-05-06 NOTE — Patient Instructions (Signed)
Medication Instructions:  INCREASE- Atorvastatin(Lipitor) 40 mg by mouth daily START- Aspirin 81 mg by mouth daily  *If you need a refill on your cardiac medications before your next appointment, please call your pharmacy*   Lab Work: None Ordered   Testing/Procedures: None ordered   Follow-Up: At [facility], you and your health needs are our priority.  As part of our continuing mission to provide you with exceptional heart care, we have created designated Provider Care Teams.  These Care Teams include your primary Cardiologist (physician) and Advanced Practice Providers (APPs -  Physician Assistants and Nurse Practitioners) who all work together to provide you with the care you need, when you need it.  We recommend signing up for the patient portal called "MyChart".  Sign up information is provided on this After Visit Summary.  MyChart is used to connect with patients for Virtual Visits (Telemedicine).  Patients are able to view lab/test results, encounter notes, upcoming appointments, etc.  Non-urgent messages can be sent to your provider as well.   To learn more about what you can do with MyChart, go to [email or web address].    Your next appointment:   As Needed

## 2020-05-21 ENCOUNTER — Other Ambulatory Visit: Payer: Self-pay | Admitting: Nurse Practitioner

## 2020-06-03 ENCOUNTER — Encounter: Payer: Self-pay | Admitting: Nurse Practitioner

## 2020-06-03 ENCOUNTER — Ambulatory Visit (INDEPENDENT_AMBULATORY_CARE_PROVIDER_SITE_OTHER): Payer: Medicare Other | Admitting: Nurse Practitioner

## 2020-06-03 VITALS — BP 142/96 | HR 70 | Temp 97.3°F | Ht 60.0 in | Wt 117.0 lb

## 2020-06-03 DIAGNOSIS — R5383 Other fatigue: Secondary | ICD-10-CM

## 2020-06-03 DIAGNOSIS — E041 Nontoxic single thyroid nodule: Secondary | ICD-10-CM

## 2020-06-03 DIAGNOSIS — I10 Essential (primary) hypertension: Secondary | ICD-10-CM

## 2020-06-03 NOTE — Progress Notes (Signed)
   Subjective:    Patient ID: [name], female    DOB: 16-Feb-1940, 80 y.o.   MRN: [id number]  HPI Follow up from cardiac work up. Has detailed pressure record with her today.  Her BP is running well below 140/90 consistently every day.  Has been checking her BP in her left arm.  No chest pain/ischemic type pain shortness of breath or edema.  Has had a full cardiac work-up, see notes.  She has an appointment with Dr. Domenic Polite at local cardiology office in June. Also patient mentions that she has a history of thyroid nodules with a biopsy and ultrasound May 2019 and repeat thyroid ultrasound August 2020.  Has not had a repeat ultrasound or thyroid labs since then.  Denies difficulty swallowing or persistent sore throats. Has cut back on her calcium intake since her serum calcium was slightly elevated.       Objective:   Physical Exam NAD.  Alert, oriented.  Cheerful affect.  Thyroid mild nodularity noted, nontender to palpation.  No goiter noted.  Lungs clear.  Heart regular rate and rhythm.  No murmur or gallop noted.  Carotids no bruits or thrills.  Lower extremities no edema. Today's Vitals   06/03/20 1559 06/03/20 1610  BP: (!) 185/87 (!) 142/96  Pulse: 70   Temp: (!) 97.3 F (36.3 C)   SpO2: 97%   Weight: 117 lb (53.1 kg)   Height: 5' (1.524 m)    Body mass index is 22.85 kg/m.       Assessment & Plan:   Problem List Items Addressed This Visit      Cardiovascular and Mediastinum   Essential hypertension - Primary    Other Visit Diagnoses    Thyroid nodule       Relevant Orders   TSH (Completed)   US SOFT TISSUE HEAD & NECK (NON-THYROID)   Fatigue, unspecified type         Continue current medication regimen as directed.  Some of her blood pressure readings were running in the 673 systolic range.  Concerned about possible orthostatic hypotension with any increase, patient agrees with this plan.  Ultrasound of the thyroid and TSH pending.  Once this lab has been  processed, will order her routine labs.  Patient to get this in 1 month since she is just recently increased her atorvastatin dose. Continue activity as tolerated and healthy diet. Return in about 3 months (around 09/02/2020).

## 2020-06-04 ENCOUNTER — Encounter: Payer: Self-pay | Admitting: Nurse Practitioner

## 2020-06-04 LAB — TSH: TSH: 2.27 u[IU]/mL (ref 0.450–4.500)

## 2020-06-06 ENCOUNTER — Other Ambulatory Visit: Payer: Self-pay

## 2020-06-06 DIAGNOSIS — I10 Essential (primary) hypertension: Secondary | ICD-10-CM

## 2020-06-06 DIAGNOSIS — E041 Nontoxic single thyroid nodule: Secondary | ICD-10-CM

## 2020-06-06 DIAGNOSIS — E782 Mixed hyperlipidemia: Secondary | ICD-10-CM

## 2020-06-08 ENCOUNTER — Other Ambulatory Visit: Payer: Self-pay | Admitting: Nurse Practitioner

## 2020-06-08 DIAGNOSIS — E041 Nontoxic single thyroid nodule: Secondary | ICD-10-CM

## 2020-06-09 DIAGNOSIS — M4316 Spondylolisthesis, lumbar region: Secondary | ICD-10-CM | POA: Diagnosis not present

## 2020-06-09 DIAGNOSIS — M5136 Other intervertebral disc degeneration, lumbar region: Secondary | ICD-10-CM | POA: Diagnosis not present

## 2020-06-10 DIAGNOSIS — M48062 Spinal stenosis, lumbar region with neurogenic claudication: Secondary | ICD-10-CM | POA: Diagnosis not present

## 2020-06-10 DIAGNOSIS — M4316 Spondylolisthesis, lumbar region: Secondary | ICD-10-CM | POA: Diagnosis not present

## 2020-06-17 ENCOUNTER — Ambulatory Visit: Payer: Medicare Other | Admitting: Nurse Practitioner

## 2020-06-17 ENCOUNTER — Other Ambulatory Visit (HOSPITAL_COMMUNITY): Payer: Self-pay | Admitting: Orthopedic Surgery

## 2020-06-17 ENCOUNTER — Telehealth: Payer: Self-pay

## 2020-06-17 ENCOUNTER — Other Ambulatory Visit: Payer: Self-pay | Admitting: Orthopedic Surgery

## 2020-06-17 DIAGNOSIS — M48062 Spinal stenosis, lumbar region with neurogenic claudication: Secondary | ICD-10-CM

## 2020-06-17 DIAGNOSIS — M4316 Spondylolisthesis, lumbar region: Secondary | ICD-10-CM

## 2020-06-17 NOTE — Telephone Encounter (Signed)
Error

## 2020-06-20 ENCOUNTER — Other Ambulatory Visit: Payer: Self-pay

## 2020-06-20 ENCOUNTER — Ambulatory Visit (HOSPITAL_COMMUNITY)
Admission: RE | Admit: 2020-06-20 | Discharge: 2020-06-20 | Disposition: A | Discharge status: Home / Self Care | Payer: Medicare Other | Source: Ambulatory Visit | Attending: Nurse Practitioner | Admitting: Nurse Practitioner

## 2020-06-20 DIAGNOSIS — E042 Nontoxic multinodular goiter: Secondary | ICD-10-CM | POA: Diagnosis not present

## 2020-06-20 DIAGNOSIS — E041 Nontoxic single thyroid nodule: Secondary | ICD-10-CM | POA: Insufficient documentation

## 2020-06-21 ENCOUNTER — Ambulatory Visit (HOSPITAL_COMMUNITY)
Admission: RE | Admit: 2020-06-21 | Discharge: 2020-06-21 | Disposition: A | Discharge status: Home / Self Care | Payer: Medicare Other | Source: Ambulatory Visit | Attending: Orthopedic Surgery | Admitting: Orthopedic Surgery

## 2020-06-21 DIAGNOSIS — M48062 Spinal stenosis, lumbar region with neurogenic claudication: Secondary | ICD-10-CM | POA: Insufficient documentation

## 2020-06-21 DIAGNOSIS — M4316 Spondylolisthesis, lumbar region: Secondary | ICD-10-CM | POA: Insufficient documentation

## 2020-06-21 DIAGNOSIS — M545 Low back pain, unspecified: Secondary | ICD-10-CM | POA: Diagnosis not present

## 2020-06-22 DIAGNOSIS — M8580 Other specified disorders of bone density and structure, unspecified site: Secondary | ICD-10-CM | POA: Diagnosis not present

## 2020-06-22 DIAGNOSIS — E041 Nontoxic single thyroid nodule: Secondary | ICD-10-CM | POA: Diagnosis not present

## 2020-06-22 DIAGNOSIS — E782 Mixed hyperlipidemia: Secondary | ICD-10-CM | POA: Diagnosis not present

## 2020-06-22 DIAGNOSIS — I10 Essential (primary) hypertension: Secondary | ICD-10-CM | POA: Diagnosis not present

## 2020-06-23 DIAGNOSIS — Z23 Encounter for immunization: Secondary | ICD-10-CM | POA: Diagnosis not present

## 2020-06-23 LAB — COMPREHENSIVE METABOLIC PANEL
ALT: 38 IU/L — ABNORMAL HIGH (ref 0–32)
AST: 27 IU/L (ref 0–40)
Albumin/Globulin Ratio: 2.7 — ABNORMAL HIGH (ref 1.2–2.2)
Albumin: 5.1 g/dL — ABNORMAL HIGH (ref 3.7–4.7)
Alkaline Phosphatase: 55 IU/L (ref 44–121)
BUN/Creatinine Ratio: 16 (ref 12–28)
BUN: 11 mg/dL (ref 8–27)
Bilirubin Total: 0.8 mg/dL (ref 0.0–1.2)
CO2: 23 mmol/L (ref 20–29)
Calcium: 10 mg/dL (ref 8.7–10.3)
Chloride: 90 mmol/L — ABNORMAL LOW (ref 96–106)
Creatinine, Ser: 0.68 mg/dL (ref 0.57–1.00)
Globulin, Total: 1.9 g/dL (ref 1.5–4.5)
Glucose: 88 mg/dL (ref 65–99)
Potassium: 4.3 mmol/L (ref 3.5–5.2)
Sodium: 130 mmol/L — ABNORMAL LOW (ref 134–144)
Total Protein: 7 g/dL (ref 6.0–8.5)
eGFR: 88 mL/min/{1.73_m2} (ref >59)

## 2020-06-23 LAB — CBC WITH DIFFERENTIAL/PLATELET
Basophils Absolute: 0 10*3/uL (ref 0.0–0.2)
Basos: 1 %
EOS (ABSOLUTE): 0.2 10*3/uL (ref 0.0–0.4)
Eos: 3 %
Hematocrit: 39.4 % (ref 34.0–46.6)
Hemoglobin: 13.7 g/dL (ref 11.1–15.9)
Immature Grans (Abs): 0 10*3/uL (ref 0.0–0.1)
Immature Granulocytes: 0 %
Lymphocytes Absolute: 1.1 10*3/uL (ref 0.7–3.1)
Lymphs: 23 %
MCH: 33.2 pg — ABNORMAL HIGH (ref 26.6–33.0)
MCHC: 34.8 g/dL (ref 31.5–35.7)
MCV: 95 fL (ref 79–97)
Monocytes Absolute: 0.9 10*3/uL (ref 0.1–0.9)
Monocytes: 17 %
Neutrophils Absolute: 2.8 10*3/uL (ref 1.4–7.0)
Neutrophils: 56 %
Platelets: 234 10*3/uL (ref 150–450)
RBC: 4.13 x10E6/uL (ref 3.77–5.28)
RDW: 12.1 % (ref 11.7–15.4)
WBC: 5 10*3/uL (ref 3.4–10.8)

## 2020-06-23 LAB — LIPID PANEL
Chol/HDL Ratio: 1.8 ratio (ref 0.0–4.4)
Cholesterol, Total: 181 mg/dL (ref 100–199)
HDL: 102 mg/dL (ref >39)
LDL Chol Calc (NIH): 68 mg/dL (ref 0–99)
Triglycerides: 53 mg/dL (ref 0–149)
VLDL Cholesterol Cal: 11 mg/dL (ref 5–40)

## 2020-06-23 LAB — VITAMIN D 25 HYDROXY (VIT D DEFICIENCY, FRACTURES): Vit D, 25-Hydroxy: 57.6 ng/mL (ref 30.0–100.0)

## 2020-06-24 ENCOUNTER — Ambulatory Visit (INDEPENDENT_AMBULATORY_CARE_PROVIDER_SITE_OTHER): Payer: Medicare Other | Admitting: Nurse Practitioner

## 2020-06-24 ENCOUNTER — Other Ambulatory Visit: Payer: Self-pay

## 2020-06-24 VITALS — BP 130/76 | HR 61 | Temp 97.3°F | Ht 60.0 in | Wt 120.0 lb

## 2020-06-24 DIAGNOSIS — E782 Mixed hyperlipidemia: Secondary | ICD-10-CM | POA: Diagnosis not present

## 2020-06-24 DIAGNOSIS — E871 Hypo-osmolality and hyponatremia: Secondary | ICD-10-CM

## 2020-06-24 DIAGNOSIS — E042 Nontoxic multinodular goiter: Secondary | ICD-10-CM | POA: Diagnosis not present

## 2020-06-24 DIAGNOSIS — I10 Essential (primary) hypertension: Secondary | ICD-10-CM

## 2020-06-24 MED ORDER — HYDROCHLOROTHIAZIDE 12.5 MG PO CAPS
12.5000 mg | ORAL_CAPSULE | Freq: Every day | ORAL | 0 refills | Status: DC
Start: 2020-06-24 — End: 2021-01-05

## 2020-06-24 NOTE — Progress Notes (Signed)
Subjective:    Patient ID: [name], female    DOB: 1940-12-28, 80 y.o.   MRN: [id number]  HPI Presents to discuss her recent labs and her recent consultation with neurosurgeon in [place] where her family lives. Considering back surgery. BP at home ranging from 121-147/68-83.  Adherent to medication regimen.    Review of Systems  Respiratory: Negative for chest tightness and shortness of breath.   Cardiovascular: Negative for chest pain, palpitations and leg swelling.  Musculoskeletal: Positive for back pain.       Objective:   Physical Exam NAD. Alert, oriented. Lungs clear. Heart RRR. LE: no edema.  Results for orders placed or performed in visit on 06/06/20  CBC with Differential/Platelet  Result Value Ref Range   WBC 5.0 3.4 - 10.8 x10E3/uL   RBC 4.13 3.77 - 5.28 x10E6/uL   Hemoglobin 13.7 11.1 - 15.9 g/dL   Hematocrit 39.4 34.0 - 46.6 %   MCV 95 79 - 97 fL   MCH 33.2 (H) 26.6 - 33.0 pg   MCHC 34.8 31.5 - 35.7 g/dL   RDW 12.1 11.7 - 15.4 %   Platelets 234 150 - 450 x10E3/uL   Neutrophils 56 Not Estab. %   Lymphs 23 Not Estab. %   Monocytes 17 Not Estab. %   Eos 3 Not Estab. %   Basos 1 Not Estab. %   Neutrophils Absolute 2.8 1.4 - 7.0 x10E3/uL   Lymphocytes Absolute 1.1 0.7 - 3.1 x10E3/uL   Monocytes Absolute 0.9 0.1 - 0.9 x10E3/uL   EOS (ABSOLUTE) 0.2 0.0 - 0.4 x10E3/uL   Basophils Absolute 0.0 0.0 - 0.2 x10E3/uL   Immature Granulocytes 0 Not Estab. %   Immature Grans (Abs) 0.0 0.0 - 0.1 x10E3/uL  Comprehensive metabolic panel  Result Value Ref Range   Glucose 88 65 - 99 mg/dL   BUN 11 8 - 27 mg/dL   Creatinine, Ser 0.68 0.57 - 1.00 mg/dL   eGFR 88 >59 mL/min/1.73   BUN/Creatinine Ratio 16 12 - 28   Sodium 130 (L) 134 - 144 mmol/L   Potassium 4.3 3.5 - 5.2 mmol/L   Chloride 90 (L) 96 - 106 mmol/L   CO2 23 20 - 29 mmol/L   Calcium 10.0 8.7 - 10.3 mg/dL   Total Protein 7.0 6.0 - 8.5 g/dL   Albumin 5.1 (H) 3.7 - 4.7 g/dL   Globulin, Total 1.9  1.5 - 4.5 g/dL   Albumin/Globulin Ratio 2.7 (H) 1.2 - 2.2   Bilirubin Total 0.8 0.0 - 1.2 mg/dL   Alkaline Phosphatase 55 44 - 121 IU/L   AST 27 0 - 40 IU/L   ALT 38 (H) 0 - 32 IU/L  Lipid panel  Result Value Ref Range   Cholesterol, Total 181 100 - 199 mg/dL   Triglycerides 53 0 - 149 mg/dL   HDL 102 >39 mg/dL   VLDL Cholesterol Cal 11 5 - 40 mg/dL   LDL Chol Calc (NIH) 68 0 - 99 mg/dL   Chol/HDL Ratio 1.8 0.0 - 4.4 ratio  VITAMIN D 25 Hydroxy (Vit-D Deficiency, Fractures)  Result Value Ref Range   Vit D, 25-Hydroxy 57.6 30.0 - 100.0 ng/mL   06/20/20 thyroid US: since previous biopsy was benign, no further testing is needed. TSH on 06/03/20 2.27. Patient verbalizes understanding.        Assessment & Plan:   Problem List Items Addressed This Visit      Cardiovascular and Mediastinum   Essential hypertension -  Primary   Relevant Medications   hydrochlorothiazide (MICROZIDE) 12.5 MG capsule     Endocrine   Multinodular goiter (nontoxic)     Other   Hyponatremia   Mixed hyperlipidemia   Relevant Medications   hydrochlorothiazide (MICROZIDE) 12.5 MG capsule     Meds ordered this encounter  Medications  . hydrochlorothiazide (MICROZIDE) 12.5 MG capsule    Sig: Take 1 capsule (12.5 mg total) by mouth daily.    Dispense:  90 capsule    Refill:  0    Order Specific Question:   Supervising Provider    Answer:   Sallee Lange A [[id number]]   Continue current medication regimen.  Reviewed labs with patient. Slight elevation of ALT. Mild hyponatremia. Recommend repeating labs at next visit. Discussed measures to improve sodium level.  Continue follow up with neurosurgeon for back pain.  Return in about 3 months (around 09/24/2020).

## 2020-06-25 ENCOUNTER — Other Ambulatory Visit: Payer: Self-pay | Admitting: Family Medicine

## 2020-06-25 ENCOUNTER — Encounter: Payer: Self-pay | Admitting: Nurse Practitioner

## 2020-06-25 DIAGNOSIS — I10 Essential (primary) hypertension: Secondary | ICD-10-CM

## 2020-06-25 DIAGNOSIS — I499 Cardiac arrhythmia, unspecified: Secondary | ICD-10-CM

## 2020-06-25 DIAGNOSIS — E871 Hypo-osmolality and hyponatremia: Secondary | ICD-10-CM | POA: Insufficient documentation

## 2020-06-25 DIAGNOSIS — E042 Nontoxic multinodular goiter: Secondary | ICD-10-CM | POA: Insufficient documentation

## 2020-06-25 DIAGNOSIS — E782 Mixed hyperlipidemia: Secondary | ICD-10-CM | POA: Insufficient documentation

## 2020-06-29 ENCOUNTER — Other Ambulatory Visit: Payer: Self-pay

## 2020-06-29 DIAGNOSIS — I10 Essential (primary) hypertension: Secondary | ICD-10-CM

## 2020-06-29 DIAGNOSIS — I499 Cardiac arrhythmia, unspecified: Secondary | ICD-10-CM

## 2020-06-29 MED ORDER — METOPROLOL TARTRATE 50 MG PO TABS: 50.0000 mg | ORAL_TABLET | Freq: Two times a day (BID) | ORAL | 0 refills | Status: DC

## 2020-06-29 NOTE — Telephone Encounter (Signed)
Patient called about her rx for metoprolol that the pharmacy sent over a few days ago.  She said that she is out and going out of town so she can't wait for Hoyle Sauer to get the message on Friday.  Patient was seen last Friday.  [facility]

## 2020-07-01 ENCOUNTER — Ambulatory Visit (HOSPITAL_COMMUNITY): Payer: Medicare Other

## 2020-07-01 ENCOUNTER — Ambulatory Visit: Payer: Medicare Other | Admitting: Nurse Practitioner

## 2020-07-08 ENCOUNTER — Telehealth: Payer: Self-pay | Admitting: Adult Health

## 2020-07-08 NOTE — Telephone Encounter (Signed)
PT STATES SHE HAS SEVERAL QUESTIONS ABOUT CARE PLAN AND WOULD LIKE TO SPEAK WITH KATHRYN WHEN POSSIBLE

## 2020-07-14 ENCOUNTER — Telehealth: Payer: Self-pay

## 2020-07-14 NOTE — Telephone Encounter (Signed)
Would like to have [facility] call her regarding a cardiac referal.

## 2020-07-14 NOTE — Telephone Encounter (Signed)
Pt states she needs cardiac clearance from carolyn for upcoming back surgery in July. I told pt it would need to come from her cardiologist. She states she just carolyn to tell neurosurgeon she is cleared. She states she is going to see neurosurgeon one week from today. I told her to have them to send any forms they needed filled out but cardiologist would need to do cardiac clearance. I think [place]. She wanted you to call her but I told her to just see him first and his office send over what they need.

## 2020-07-15 MED ORDER — ASPIRIN EC 81 MG PO TBEC: 81.0000 mg | DELAYED_RELEASE_TABLET | Freq: Every day | ORAL | 3 refills | Status: DC

## 2020-07-15 NOTE — Telephone Encounter (Signed)
Returned call to pt and notified pt of pre-op procedure, gave her items that need to be included and fax number to fax request. ASA added to med list per pt

## 2020-07-22 DIAGNOSIS — M4316 Spondylolisthesis, lumbar region: Secondary | ICD-10-CM | POA: Diagnosis not present

## 2020-07-22 DIAGNOSIS — M48061 Spinal stenosis, lumbar region without neurogenic claudication: Secondary | ICD-10-CM | POA: Diagnosis not present

## 2020-07-22 DIAGNOSIS — M47816 Spondylosis without myelopathy or radiculopathy, lumbar region: Secondary | ICD-10-CM | POA: Diagnosis not present

## 2020-07-22 DIAGNOSIS — M9973 Connective tissue and disc stenosis of intervertebral foramina of lumbar region: Secondary | ICD-10-CM | POA: Diagnosis not present

## 2020-07-22 DIAGNOSIS — M48062 Spinal stenosis, lumbar region with neurogenic claudication: Secondary | ICD-10-CM | POA: Diagnosis not present

## 2020-07-28 ENCOUNTER — Other Ambulatory Visit: Payer: Self-pay | Admitting: Family Medicine

## 2020-07-29 NOTE — Telephone Encounter (Signed)
Left message to return call 

## 2020-08-03 ENCOUNTER — Ambulatory Visit: Payer: Medicare Other | Admitting: Cardiology

## 2020-08-03 NOTE — Telephone Encounter (Signed)
Mychart message sent to patient.

## 2020-08-19 ENCOUNTER — Telehealth: Payer: Self-pay | Admitting: Family Medicine

## 2020-08-19 NOTE — Telephone Encounter (Signed)
Patient is having back surgery 7/26 and wanting to know if she needs to stop taking baby aspirin or any of her medications that she on.please advise

## 2020-08-19 NOTE — Telephone Encounter (Signed)
Speak with cardiology to see if okay to come off the asa, what was reason you were on the 81mg  asa?   Thx,   Dr. Lovena Le

## 2020-08-19 NOTE — Telephone Encounter (Signed)
Discussed with pt. Pt states [name] put her on asa 81mg  due to family history of stoke. Advised her to call cardiology per dr taylor to see if she can stop. Pt then asked me to send message to carolyn because she did a through work up on her and she originally asked for message to go to carolyn.

## 2020-08-22 NOTE — Telephone Encounter (Signed)
Patient notified and wants to clarify that she does continue the ASA even thru and after her upcoming back surgery 08/30/20

## 2020-08-22 NOTE — Telephone Encounter (Signed)
Nilda Simmer, NP      I reviewed [name]'s notes from the last visit. Recommend she continue low dose aspirin as a preventive measure for stroke. Thanks, Hoyle Sauer

## 2020-08-24 NOTE — Telephone Encounter (Signed)
Patient advised per Hoyle Sauer NP:  The surgeon or the anesthesiology team should address this and all of her medicines as far as which ones to stop or resume. She will most likely bestopping the aspirin; they will tell her when to stop before surgery. Patient verbalized understanding.

## 2020-08-24 NOTE — Telephone Encounter (Signed)
The surgeon or the anesthesiology team should address this and all of her medicines as far as which ones to stop or resume. She will most likely bestopping the aspirin; they will tell her when to stop before surgery. Thanks. Hoyle Sauer

## 2020-08-27 DIAGNOSIS — H9193 Unspecified hearing loss, bilateral: Secondary | ICD-10-CM | POA: Diagnosis not present

## 2020-08-27 DIAGNOSIS — E042 Nontoxic multinodular goiter: Secondary | ICD-10-CM | POA: Diagnosis not present

## 2020-08-27 DIAGNOSIS — M810 Age-related osteoporosis without current pathological fracture: Secondary | ICD-10-CM | POA: Diagnosis not present

## 2020-08-27 DIAGNOSIS — M48062 Spinal stenosis, lumbar region with neurogenic claudication: Secondary | ICD-10-CM | POA: Diagnosis not present

## 2020-08-27 DIAGNOSIS — R202 Paresthesia of skin: Secondary | ICD-10-CM | POA: Diagnosis not present

## 2020-08-27 DIAGNOSIS — E7800 Pure hypercholesterolemia, unspecified: Secondary | ICD-10-CM | POA: Diagnosis not present

## 2020-08-27 DIAGNOSIS — C4491 Basal cell carcinoma of skin, unspecified: Secondary | ICD-10-CM | POA: Diagnosis not present

## 2020-08-27 DIAGNOSIS — Z1159 Encounter for screening for other viral diseases: Secondary | ICD-10-CM | POA: Diagnosis not present

## 2020-08-27 DIAGNOSIS — R279 Unspecified lack of coordination: Secondary | ICD-10-CM | POA: Diagnosis not present

## 2020-08-27 DIAGNOSIS — Z7982 Long term (current) use of aspirin: Secondary | ICD-10-CM | POA: Diagnosis not present

## 2020-08-27 DIAGNOSIS — Z4789 Encounter for other orthopedic aftercare: Secondary | ICD-10-CM | POA: Diagnosis not present

## 2020-08-27 DIAGNOSIS — K589 Irritable bowel syndrome without diarrhea: Secondary | ICD-10-CM | POA: Diagnosis not present

## 2020-08-27 DIAGNOSIS — R262 Difficulty in walking, not elsewhere classified: Secondary | ICD-10-CM | POA: Diagnosis not present

## 2020-08-27 DIAGNOSIS — M4326 Fusion of spine, lumbar region: Secondary | ICD-10-CM | POA: Diagnosis not present

## 2020-08-27 DIAGNOSIS — C4492 Squamous cell carcinoma of skin, unspecified: Secondary | ICD-10-CM | POA: Diagnosis not present

## 2020-08-27 DIAGNOSIS — M1380 Other specified arthritis, unspecified site: Secondary | ICD-10-CM | POA: Diagnosis not present

## 2020-08-27 DIAGNOSIS — R0600 Dyspnea, unspecified: Secondary | ICD-10-CM | POA: Diagnosis not present

## 2020-08-27 DIAGNOSIS — M4316 Spondylolisthesis, lumbar region: Secondary | ICD-10-CM | POA: Diagnosis not present

## 2020-08-27 DIAGNOSIS — M7040 Prepatellar bursitis, unspecified knee: Secondary | ICD-10-CM | POA: Diagnosis not present

## 2020-08-27 DIAGNOSIS — R002 Palpitations: Secondary | ICD-10-CM | POA: Diagnosis not present

## 2020-08-27 DIAGNOSIS — M6281 Muscle weakness (generalized): Secondary | ICD-10-CM | POA: Diagnosis not present

## 2020-08-27 DIAGNOSIS — M545 Low back pain, unspecified: Secondary | ICD-10-CM | POA: Diagnosis not present

## 2020-08-27 DIAGNOSIS — I10 Essential (primary) hypertension: Secondary | ICD-10-CM | POA: Diagnosis not present

## 2020-08-28 HISTORY — PX: LUMBAR FUSION: SHX111

## 2020-08-29 ENCOUNTER — Telehealth: Payer: Self-pay | Admitting: Cardiology

## 2020-08-29 NOTE — Telephone Encounter (Signed)
Pt is having surgery tomorrow at [facility]   They are neededing:   Last EKG and recents labs  Fax number [phone] Phone [phone]   Pt last seen [name] at [facility]

## 2020-08-30 DIAGNOSIS — M48062 Spinal stenosis, lumbar region with neurogenic claudication: Secondary | ICD-10-CM | POA: Diagnosis not present

## 2020-08-30 DIAGNOSIS — M4326 Fusion of spine, lumbar region: Secondary | ICD-10-CM | POA: Diagnosis not present

## 2020-08-30 DIAGNOSIS — M4316 Spondylolisthesis, lumbar region: Secondary | ICD-10-CM | POA: Diagnosis not present

## 2020-09-02 DIAGNOSIS — M4326 Fusion of spine, lumbar region: Secondary | ICD-10-CM | POA: Diagnosis not present

## 2020-09-05 DIAGNOSIS — R279 Unspecified lack of coordination: Secondary | ICD-10-CM | POA: Diagnosis not present

## 2020-09-05 DIAGNOSIS — H9193 Unspecified hearing loss, bilateral: Secondary | ICD-10-CM | POA: Diagnosis not present

## 2020-09-05 DIAGNOSIS — Z7189 Other specified counseling: Secondary | ICD-10-CM | POA: Diagnosis not present

## 2020-09-05 DIAGNOSIS — I10 Essential (primary) hypertension: Secondary | ICD-10-CM | POA: Diagnosis not present

## 2020-09-05 DIAGNOSIS — M48062 Spinal stenosis, lumbar region with neurogenic claudication: Secondary | ICD-10-CM | POA: Diagnosis not present

## 2020-09-05 DIAGNOSIS — R0600 Dyspnea, unspecified: Secondary | ICD-10-CM | POA: Diagnosis not present

## 2020-09-05 DIAGNOSIS — M1380 Other specified arthritis, unspecified site: Secondary | ICD-10-CM | POA: Diagnosis not present

## 2020-09-05 DIAGNOSIS — R262 Difficulty in walking, not elsewhere classified: Secondary | ICD-10-CM | POA: Diagnosis not present

## 2020-09-05 DIAGNOSIS — E7800 Pure hypercholesterolemia, unspecified: Secondary | ICD-10-CM | POA: Diagnosis not present

## 2020-09-05 DIAGNOSIS — E785 Hyperlipidemia, unspecified: Secondary | ICD-10-CM | POA: Diagnosis not present

## 2020-09-05 DIAGNOSIS — C4491 Basal cell carcinoma of skin, unspecified: Secondary | ICD-10-CM | POA: Diagnosis not present

## 2020-09-05 DIAGNOSIS — K5900 Constipation, unspecified: Secondary | ICD-10-CM | POA: Diagnosis not present

## 2020-09-05 DIAGNOSIS — C4492 Squamous cell carcinoma of skin, unspecified: Secondary | ICD-10-CM | POA: Diagnosis not present

## 2020-09-05 DIAGNOSIS — M4316 Spondylolisthesis, lumbar region: Secondary | ICD-10-CM | POA: Diagnosis not present

## 2020-09-05 DIAGNOSIS — Z981 Arthrodesis status: Secondary | ICD-10-CM | POA: Diagnosis not present

## 2020-09-05 DIAGNOSIS — M810 Age-related osteoporosis without current pathological fracture: Secondary | ICD-10-CM | POA: Diagnosis not present

## 2020-09-05 DIAGNOSIS — M6281 Muscle weakness (generalized): Secondary | ICD-10-CM | POA: Diagnosis not present

## 2020-09-05 DIAGNOSIS — G8918 Other acute postprocedural pain: Secondary | ICD-10-CM | POA: Diagnosis not present

## 2020-09-05 DIAGNOSIS — M62838 Other muscle spasm: Secondary | ICD-10-CM | POA: Diagnosis not present

## 2020-09-05 DIAGNOSIS — Z4789 Encounter for other orthopedic aftercare: Secondary | ICD-10-CM | POA: Diagnosis not present

## 2020-09-05 DIAGNOSIS — K589 Irritable bowel syndrome without diarrhea: Secondary | ICD-10-CM | POA: Diagnosis not present

## 2020-09-05 DIAGNOSIS — M7040 Prepatellar bursitis, unspecified knee: Secondary | ICD-10-CM | POA: Diagnosis not present

## 2020-09-05 DIAGNOSIS — E042 Nontoxic multinodular goiter: Secondary | ICD-10-CM | POA: Diagnosis not present

## 2020-09-05 DIAGNOSIS — R002 Palpitations: Secondary | ICD-10-CM | POA: Diagnosis not present

## 2020-09-06 DIAGNOSIS — Z981 Arthrodesis status: Secondary | ICD-10-CM | POA: Diagnosis not present

## 2020-09-06 DIAGNOSIS — G8918 Other acute postprocedural pain: Secondary | ICD-10-CM | POA: Diagnosis not present

## 2020-09-06 DIAGNOSIS — K5900 Constipation, unspecified: Secondary | ICD-10-CM | POA: Diagnosis not present

## 2020-09-06 DIAGNOSIS — M4316 Spondylolisthesis, lumbar region: Secondary | ICD-10-CM | POA: Diagnosis not present

## 2020-09-06 DIAGNOSIS — M62838 Other muscle spasm: Secondary | ICD-10-CM | POA: Diagnosis not present

## 2020-09-06 DIAGNOSIS — I10 Essential (primary) hypertension: Secondary | ICD-10-CM | POA: Diagnosis not present

## 2020-09-06 DIAGNOSIS — M48062 Spinal stenosis, lumbar region with neurogenic claudication: Secondary | ICD-10-CM | POA: Diagnosis not present

## 2020-09-07 DIAGNOSIS — K5900 Constipation, unspecified: Secondary | ICD-10-CM | POA: Diagnosis not present

## 2020-09-07 DIAGNOSIS — E785 Hyperlipidemia, unspecified: Secondary | ICD-10-CM | POA: Diagnosis not present

## 2020-09-07 DIAGNOSIS — M4316 Spondylolisthesis, lumbar region: Secondary | ICD-10-CM | POA: Diagnosis not present

## 2020-09-07 DIAGNOSIS — Z981 Arthrodesis status: Secondary | ICD-10-CM | POA: Diagnosis not present

## 2020-09-07 DIAGNOSIS — M62838 Other muscle spasm: Secondary | ICD-10-CM | POA: Diagnosis not present

## 2020-09-07 DIAGNOSIS — M48062 Spinal stenosis, lumbar region with neurogenic claudication: Secondary | ICD-10-CM | POA: Diagnosis not present

## 2020-09-07 DIAGNOSIS — I10 Essential (primary) hypertension: Secondary | ICD-10-CM | POA: Diagnosis not present

## 2020-09-07 DIAGNOSIS — G8918 Other acute postprocedural pain: Secondary | ICD-10-CM | POA: Diagnosis not present

## 2020-09-08 DIAGNOSIS — Z4789 Encounter for other orthopedic aftercare: Secondary | ICD-10-CM | POA: Diagnosis not present

## 2020-09-13 DIAGNOSIS — M48062 Spinal stenosis, lumbar region with neurogenic claudication: Secondary | ICD-10-CM | POA: Diagnosis not present

## 2020-09-13 DIAGNOSIS — Z981 Arthrodesis status: Secondary | ICD-10-CM | POA: Diagnosis not present

## 2020-09-13 DIAGNOSIS — Z7189 Other specified counseling: Secondary | ICD-10-CM | POA: Diagnosis not present

## 2020-09-14 DIAGNOSIS — G8918 Other acute postprocedural pain: Secondary | ICD-10-CM | POA: Diagnosis not present

## 2020-09-14 DIAGNOSIS — Z981 Arthrodesis status: Secondary | ICD-10-CM | POA: Diagnosis not present

## 2020-09-14 DIAGNOSIS — M4316 Spondylolisthesis, lumbar region: Secondary | ICD-10-CM | POA: Diagnosis not present

## 2020-09-14 DIAGNOSIS — I10 Essential (primary) hypertension: Secondary | ICD-10-CM | POA: Diagnosis not present

## 2020-09-14 DIAGNOSIS — M48062 Spinal stenosis, lumbar region with neurogenic claudication: Secondary | ICD-10-CM | POA: Diagnosis not present

## 2020-09-14 DIAGNOSIS — K589 Irritable bowel syndrome without diarrhea: Secondary | ICD-10-CM | POA: Diagnosis not present

## 2020-09-23 ENCOUNTER — Other Ambulatory Visit: Payer: Self-pay | Admitting: Nurse Practitioner

## 2020-09-23 ENCOUNTER — Ambulatory Visit: Payer: Medicare Other | Admitting: Nurse Practitioner

## 2020-10-07 ENCOUNTER — Ambulatory Visit: Payer: Medicare Other | Admitting: Nurse Practitioner

## 2020-10-11 ENCOUNTER — Telehealth: Payer: Self-pay

## 2020-10-11 NOTE — Telephone Encounter (Signed)
Tried calling patient to schedule AWV. Unable to reach patient at this time. If patient returns call, please schedule AWV in person or virtually.

## 2020-10-12 DIAGNOSIS — Z981 Arthrodesis status: Secondary | ICD-10-CM | POA: Diagnosis not present

## 2020-10-12 DIAGNOSIS — M4316 Spondylolisthesis, lumbar region: Secondary | ICD-10-CM | POA: Diagnosis not present

## 2020-10-20 ENCOUNTER — Other Ambulatory Visit: Payer: Self-pay | Admitting: Family Medicine

## 2020-10-20 DIAGNOSIS — I499 Cardiac arrhythmia, unspecified: Secondary | ICD-10-CM

## 2020-10-20 DIAGNOSIS — I10 Essential (primary) hypertension: Secondary | ICD-10-CM

## 2020-10-21 ENCOUNTER — Ambulatory Visit: Payer: Self-pay | Admitting: Nurse Practitioner

## 2020-11-10 NOTE — Progress Notes (Signed)
Cardiology Office Note   Date:  11/11/2020   ID:  [name], DOB Jun 14, 1940, MRN [id number]  PCP:  Erven Colla, DO  Cardiologist:  Dr. Domenic Polite CC:  Follow up hypertension    History of Present Illness: [name] is a 80 y.o. female who presents for ongoing assessment and management of hypertension.  She was seen on evaluation at the request of her primary care provider Pearson Forster, family nurse practitioner.  It was felt that her blood pressure continued to be elevated with unequal blood pressures from right to left arm and she was referred for more timely appointment as Dr. Domenic Polite schedule was full and there would be a long wait to be seen by him.  She has a family history of CVA and there was concern about carotid artery disease as well as subclavian steal syndrome.  Echocardiogram was ordered to evaluate for changes in LV function as well as a carotid Doppler ultrasound to evaluate for carotid artery disease and subclavian steal.  She was continued on her current medication regimen which included 25 mg of HCTZ and losartan 50 mg daily along with metoprolol 50 mg BID.  Carotid studies were completed on 04/18/2020 which demonstrated normal flow hemodynamics in the left subclavian artery with monophasic flow noted in the right subclavian artery and right innominate suggesting high-grade stenosis more proximally.  There was no evidence of stenosis in the right ICA, with extracranial vessels near normal with only minimal thickening or plaque and decreased velocities in the RCA compared to prior exam.  Left carotid velocities in the left ICA were consistent with 1 to 39% stenosis.  Decreased velocities in the LICA compared to prior exam.  Patient has been discussed with Dr. Gwenlyn Found who has reviewed the report.  No planned intervention unless she becomes symptomatic.   Echocardiogram completed on 04/29/2020 revealed LVEF of 70 to 75% with left ventricle having hyperdynamic function.   No regional wall motion abnormalities were noted.  There were no valvular issues with the exception of mild mitral valve regurgitation.  Renal artery ultrasound was ordered, revealing normal size of right kidney with 1 to 59% stenosis of the right renal artery, RV flow was present.  Normal cortical thickness of the right kidney.  Abnormal right resistive index.  Left kidney revealed normal size, abnormal left resistive index, no evidence of renal artery stenosis.  She had normal superior mesenteric artery findings.  70 to 99% stenosis in the celiac artery.  She comes today for follow-up of her hypertension control.  Since being seen last she has gone to [place] to visit her daughter and has had lumbar back surgery of the L3-L4.  Her surgeon was Dr.Raj Georgia Dom.  She then was transferred to assisted living/physical therapy center where she underwent physical therapy.  She now wears a back brace and will do so for another 3 months.  She is due to follow-up with her surgeon at the end of the 3 months.  She states that her blood pressure did well throughout her procedures and follow-up physical therapy.  She continues to be medically compliant.  She brings with her a list of her blood pressures which have been well controlled.  Past Medical History:  Diagnosis Date   Arthritis    Basal cell carcinoma 04/29/2019   right inner cheek [facility])   Basal cell carcinoma 04/29/2019   left side nose (MOHs)   Chronic kidney disease    Diverticulosis of colon    Essential  hypertension    History of basal cell carcinoma (BCC) excision    02-10-2013  left cheek  s/p  moh's sx   History of palpitations    History of recurrent UTIs    History of syncope    2015-- felt to vasovagal response to pain (per cardiologist note)   IBS (irritable bowel syndrome)    Open wound of left knee    warm soaks daily /  neosprin and dressing daily / per per still has drainage   Patellar bursitis of left knee    pre-patella  septic bursitis w/ foreign body   Right thyroid nodule    x2 right side incidental finding on carotid duplex 03/ 2018-- thyroid ultrasound done , benign , follow-up in one year   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/29/20201   in situ-right upper arm-anterior   Squamous cell carcinoma of skin 11/04/2019   KA-right lower leg-anterior superior   Wears hearing aid in both ears     Past Surgical History:  Procedure Laterality Date   APPENDECTOMY  age 26   BREAST BIOPSY  1962   benign   CATARACT EXTRACTION W/PHACO  06/25/2011   Procedure: CATARACT EXTRACTION PHACO AND INTRAOCULAR LENS PLACEMENT ([place]);  Surgeon: Williams Che, MD;  Location: [facility];  Service: Ophthalmology;  Laterality: Right;  CDE: 8.90   CATARACT EXTRACTION W/PHACO Left 05/05/2012   Procedure: CATARACT EXTRACTION PHACO AND INTRAOCULAR LENS PLACEMENT (I[facility]);  Surgeon: Williams Che, MD;  Location: [facility];  Service: Ophthalmology;  Laterality: Left;  CDE 12.78   COLONOSCOPY  last one 11-26-2012   EXCISION MORTON'S NEUROMA Right 2003  approx.   INCISION AND DRAINAGE WOUND WITH FOREIGN BODY REMOVAL Left 08/30/2016   Procedure: LEFT KNEE INCISION AND DRAINAGE WOUND WITH FOREIGN BODY REMOVAL;  Surgeon: Sydnee Cabal, MD;  Location: [place];  Service: Orthopedics;  Laterality: Left;   KNEE ARTHROPLASTY Right 03/14/2017   Procedure: RIGHT TOTAL KNEE ARTHROPLASTY WITH COMPUTER NAVIGATION AND REMOVAL OF HARDWARE;  Surgeon: Rod Can, MD;  Location: [facility];  Service: Orthopedics;  Laterality: Right;  Adductor Block   KNEE ARTHROSCOPY W/ ACL RECONSTRUCTION Right 1996   KNEE CARTILAGE SURGERY Right age 32   MOHS SURGERY  02/10/2013   left cheek -- [facility]   POSTERIOR REPAIR  05-21-2002   dr Glo Herring   symptomatic recetocele   TONSILLECTOMY AND ADENOIDECTOMY  age 47   TRANSTHORACIC ECHOCARDIOGRAM  04-17-2016  dr Domenic Polite   ef 60-65%/  trivial MR/ mild TR   VAGINAL HYSTERECTOMY  1979   w/  Bilateral  Salpingoophorectomy     Current Outpatient Medications  Medication Sig Dispense Refill   ALPRAZolam (XANAX) 0.25 MG tablet TAKE 1/2 TO 1 TABLET DAILY AS NEEDED FOR ANXIETY. 20 tablet 0   aspirin EC 81 MG tablet Take 1 tablet (81 mg total) by mouth daily. Swallow whole. 90 tablet 3   Flaxseed, Linseed, (FLAX PO) Take 1 Dose by mouth every morning.      hydrochlorothiazide (MICROZIDE) 12.5 MG capsule Take 1 capsule (12.5 mg total) by mouth daily. 90 capsule 0   losartan (COZAAR) 50 MG tablet TAKE 1 TABLET BY MOUTH EVERY MORNING. 90 tablet 0   metoprolol tartrate (LOPRESSOR) 50 MG tablet TAKE (1)TABLET TWICE DAILY. 180 tablet 0   Multiple Vitamins-Minerals (CENTRUM SILVER) tablet Take 1 tablet by mouth daily.     OMEGA 3 1000 MG CAPS Take 1,000 mg by mouth at bedtime.      OVER  THE COUNTER MEDICATION Calcium 600mg  one daily Vitamin D 2,000 iu daily     Probiotic Product (PROBIOTIC DAILY PO) Take 1 tablet by mouth daily.     Psyllium (METAMUCIL PO) Take 1 Scoop by mouth every evening.      sodium chloride (OCEAN) 0.65 % SOLN nasal spray Place 1 spray into both nostrils daily as needed for congestion.     atorvastatin (LIPITOR) 40 MG tablet Take 1 tablet (40 mg total) by mouth daily. 90 tablet 3   No current facility-administered medications for this visit.    Allergies:   Codeine and Penicillins    Social History:  The patient  reports that she quit smoking about 58 years ago. Her smoking use included cigarettes. She has a 5.00 pack-year smoking history. She has never used smokeless tobacco. She reports current alcohol use of about 14.0 standard drinks per week. She reports that she does not use drugs.   Family History:  The patient's family history includes CVA in her mother; Hypertension in her mother; Throat cancer in her father.    ROS: All other systems are reviewed and negative. Unless otherwise mentioned in H&P    PHYSICAL EXAM: VS:  BP (!) 160/90 (BP Location: Left Arm,  Patient Position: Sitting, Cuff Size: Normal)   Pulse 64   Ht 5' (1.524 m)   Wt 115 lb (52.2 kg)   LMP  (LMP Unknown)   BMI 22.46 kg/m  , BMI Body mass index is 22.46 kg/m. GEN: Well nourished, well developed, in no acute distress HEENT: normal Neck: no JVD, bilateral carotid bruits, no masses Cardiac: RRR; no murmurs, rubs, or gallops,no edema  Respiratory:  Clear to auscultation bilaterally, normal work of breathing GI: soft, nontender, nondistended, + BS MS: no deformity or atrophy, wearing a back brace Skin: warm and dry, no rash Neuro:  Strength and sensation are intact Psych: euthymic mood, full affect   EKG: Not completed this office visit.  Recent Labs: 06/03/2020: TSH 2.270 06/22/2020: ALT 38; BUN 11; Creatinine, Ser 0.68; Hemoglobin 13.7; Platelets 234; Potassium 4.3; Sodium 130    Lipid Panel    Component Value Date/Time   CHOL 181 06/22/2020 0945   TRIG 53 06/22/2020 0945   HDL 102 06/22/2020 0945   CHOLHDL 1.8 06/22/2020 0945   CHOLHDL 2.8 01/22/2013 0803   VLDL 13 01/22/2013 0803   LDLCALC 68 06/22/2020 0945      Wt Readings from Last 3 Encounters:  11/11/20 115 lb (52.2 kg)  06/24/20 120 lb (54.4 kg)  06/03/20 117 lb (53.1 kg)      Other studies Reviewed: Renal Artery Ultrasound 05/06/2020 Summary:  Largest Aortic Diameter: 2.3 cm     Renal:     Right: Normal size right kidney. 1-59% stenosis of the right renal         artery. RRV flow present. Normal cortical thickness of right         kidney. Abnormal right Resistive Index.  Left:  Normal size of left kidney. Abnormal left Resistive Index. No         evidence of left renal artery stenosis. LRV flow present.         Normal cortical thickness of the left kidney.  Mesenteric:  Normal Superior Mesenteric artery findings. 70 to 99% stenosis in the  celiac  artery.     Patent IVC.   Echocardiogram 04/29/2020 1. Left ventricular ejection fraction, by estimation, is 70 to 75%. The  left  ventricle has hyperdynamic  function. The left ventricle has no  regional wall motion abnormalities. Left ventricular diastolic parameters  were normal.   2. Right ventricular systolic function is normal. The right ventricular  size is normal. There is normal pulmonary artery systolic pressure.   3. The mitral valve is normal in structure. Mild mitral valve  regurgitation.   4. The aortic valve is tricuspid. Aortic valve regurgitation is not  visualized. Mild aortic valve sclerosis is present, with no evidence of  aortic valve stenosis.   5. The inferior vena cava is normal in size with greater than 50%  respiratory variability, suggesting right atrial pressure of 3 mmHg.   Comparison(s): The left ventricular function is unchanged.   Carotid Ultrasound: 04/18/2020 Summary:  Right Carotid: There is no evidence of stenosis in the right ICA. The                 extracranial vessels were near-normal with only minimal  wall                 thickening or plaque. Decrease velocities in the RICA  compared to                 prior exam.   Left Carotid: Velocities in the left ICA are consistent with a 1-39%  stenosis.                Decrease velocities in the LICA compared to prior exam.   Vertebrals:  Bilateral vertebral arteries demonstrate antegrade flow.  Subclavians: Normal flow hemodynamics were seen in the left subclavian  artery.               Monophasic flow noted in the right subclavian artery and  right innominate suggesting a high grade stenosis more proximally.    ASSESSMENT AND PLAN:  1.  Hypertension: She did have carotid ultrasound which did reveal some proximal stenosis noted on the right subclavian with monophasic flow and right innominate artery.  It is recommended that she does not have any blood pressures in the right arm.  She verbalizes understanding.  She states she is left-handed and will remember to have her blood pressure taken in that arm from this point on.  She  wishes to discontinue hydrochlorothiazide as she states it is causing her to have to urinate too much.  I have advised her to continue to take her blood pressure daily, if blood pressure remains greater than 1 64-3 55 systolic over 32-95, she will need to take her HCTZ that day.  She is to avoid high salt diet.  I had Dr. Alvester Chou review her carotid ultrasound concerning the subclavian artery on the right.  She is asymptomatic concerning any numbness tingling or coldness in her right arm.  As long as she is asymptomatic no further testing will be planned.  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  We will continue to monitor.  No evidence of renal artery stenosis.  3.  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 Clovis Cao, in [place].  This occurred in August 2022.  She will continue to wear back brace and follow-up with surgeon on prescheduled appointment postoperatively   Current medicines are reviewed at length with the patient today.  I have spent 25 mins  dedicated to the care of this patient on the date of this encounter to include pre-visit review of records, assessment, management and diagnostic testing,with shared decision making.  Labs/ tests ordered  today include: None  Phill Myron. West Pugh, ANP, AACC   11/11/2020 1:06 PM    [facility] [place] [address] Office [phone] Fax [phone]  Notice: This dictation was prepared with Dragon dictation along with smaller phrase technology. Any transcriptional errors that result from this process are unintentional and may not be corrected upon review.

## 2020-11-11 ENCOUNTER — Other Ambulatory Visit: Payer: Self-pay

## 2020-11-11 ENCOUNTER — Ambulatory Visit (INDEPENDENT_AMBULATORY_CARE_PROVIDER_SITE_OTHER): Payer: Medicare Other | Admitting: Adult Health

## 2020-11-11 ENCOUNTER — Encounter: Payer: Self-pay | Admitting: Adult Health

## 2020-11-11 VITALS — BP 160/90 | HR 64 | Ht 60.0 in | Wt 115.0 lb

## 2020-11-11 DIAGNOSIS — G458 Other transient cerebral ischemic attacks and related syndromes: Secondary | ICD-10-CM

## 2020-11-11 DIAGNOSIS — M5136 Other intervertebral disc degeneration, lumbar region: Secondary | ICD-10-CM | POA: Diagnosis not present

## 2020-11-11 DIAGNOSIS — I10 Essential (primary) hypertension: Secondary | ICD-10-CM

## 2020-11-11 DIAGNOSIS — I771 Stricture of artery: Secondary | ICD-10-CM

## 2020-11-11 DIAGNOSIS — I774 Celiac artery compression syndrome: Secondary | ICD-10-CM

## 2020-11-11 DIAGNOSIS — M51369 Other intervertebral disc degeneration, lumbar region without mention of lumbar back pain or lower extremity pain: Secondary | ICD-10-CM

## 2020-11-11 NOTE — Patient Instructions (Addendum)
Medication Instructions:  No changes *If you need a refill on your cardiac medications before your next appointment, please call your pharmacy*   Lab Work: No labs If you have labs (blood work) drawn today and your tests are completely normal, you will receive your results only by: [place] (if you have MyChart) OR A paper copy in the mail If you have any lab test that is abnormal or we need to change your treatment, we will call you to review the results.   Testing/Procedures: No Testing   Follow-Up: At [facility], you and your health needs are our priority.  As part of our continuing mission to provide you with exceptional heart care, we have created designated Provider Care Teams.  These Care Teams include your primary Cardiologist (physician) and Advanced Practice Providers (APPs -  Physician Assistants and Nurse Practitioners) who all work together to provide you with the care you need, when you need it.  We recommend signing up for the patient portal called "MyChart".  Sign up information is provided on this After Visit Summary.  MyChart is used to connect with patients for Virtual Visits (Telemedicine).  Patients are able to view lab/test results, encounter notes, upcoming appointments, etc.  Non-urgent messages can be sent to your provider as well.   To learn more about what you can do with MyChart, go to [email or web address].    Your next appointment:   6 months  The format for your next appointment:   In Person  Provider:   [name], DNP, ANP

## 2020-11-18 ENCOUNTER — Telehealth: Payer: Self-pay | Admitting: Family Medicine

## 2020-11-18 DIAGNOSIS — Z780 Asymptomatic menopausal state: Secondary | ICD-10-CM

## 2020-11-18 DIAGNOSIS — M8580 Other specified disorders of bone density and structure, unspecified site: Secondary | ICD-10-CM

## 2020-11-18 NOTE — Telephone Encounter (Signed)
Patient is requesting order for bone density test. She is stating its time for her to get one.

## 2020-11-18 NOTE — Telephone Encounter (Signed)
Please advise. Thank you

## 2020-11-21 NOTE — Telephone Encounter (Signed)
Dexa scan ordered. Per scheduler at [facility], it has to be 2 years and one day from previous bone density test in order for insurance to cover it.  Pt has bone density 11/11/19. Sent my chart message to patient.

## 2020-11-25 NOTE — Telephone Encounter (Signed)
Pt replied via my chart and verbalized understanding.  

## 2021-01-05 ENCOUNTER — Ambulatory Visit (HOSPITAL_COMMUNITY)
Admission: RE | Admit: 2021-01-05 | Discharge: 2021-01-05 | Disposition: A | Discharge status: Home / Self Care | Payer: Medicare Other | Source: Ambulatory Visit | Attending: Family Medicine | Admitting: Family Medicine

## 2021-01-05 ENCOUNTER — Encounter: Payer: Medicare Other | Admitting: Family Medicine

## 2021-01-05 ENCOUNTER — Ambulatory Visit (INDEPENDENT_AMBULATORY_CARE_PROVIDER_SITE_OTHER): Payer: Medicare Other | Admitting: Family Medicine

## 2021-01-05 ENCOUNTER — Other Ambulatory Visit: Payer: Self-pay

## 2021-01-05 VITALS — BP 187/98 | HR 72 | Temp 97.2°F | Ht 60.0 in | Wt 115.0 lb

## 2021-01-05 DIAGNOSIS — M25552 Pain in left hip: Secondary | ICD-10-CM

## 2021-01-05 DIAGNOSIS — Z0001 Encounter for general adult medical examination with abnormal findings: Secondary | ICD-10-CM | POA: Diagnosis not present

## 2021-01-05 DIAGNOSIS — I10 Essential (primary) hypertension: Secondary | ICD-10-CM

## 2021-01-05 DIAGNOSIS — I499 Cardiac arrhythmia, unspecified: Secondary | ICD-10-CM | POA: Diagnosis not present

## 2021-01-05 MED ORDER — ATORVASTATIN CALCIUM 40 MG PO TABS: 40.0000 mg | ORAL_TABLET | Freq: Every day | ORAL | 3 refills | Status: DC

## 2021-01-05 MED ORDER — LOSARTAN POTASSIUM 50 MG PO TABS: 50.0000 mg | ORAL_TABLET | Freq: Every morning | ORAL | 3 refills | Status: DC

## 2021-01-05 MED ORDER — METOPROLOL TARTRATE 50 MG PO TABS
ORAL_TABLET | ORAL | 3 refills | Status: DC
Start: 2021-01-05 — End: 2022-01-15

## 2021-01-05 NOTE — Patient Instructions (Signed)
Stop the HCTZ.  Check BP at home. If BP is consistently above 140/90 let me know.  Xray of the hip ordered today. You can get it whenever you like.  Follow up in 6 months.  Take care  Dr. Lacinda Axon

## 2021-01-05 NOTE — Progress Notes (Signed)
Subjective:   [name] is a 80 y.o. female who presents for Medicare Annual (Subsequent) preventive examination.  Current issues/concerns: Needs medication refills.  Patient also reports ongoing left hip pain. Has been going on for months. Localizes the pain to the left inguinal region. She is very active. This is troublesome for her at times. She is requesting imaging.      Objective:    Today's Vitals   01/05/21 0901  Pulse: 72  Temp: (!) 97.2 F (36.2 C)  SpO2: 99%  Weight: 115 lb (52.2 kg)  Height: 5' (1.524 m)   Body mass index is 22.46 kg/m.  Physical Exam   Gen.: Well-appearing elderly female in no acute distress. HENT: Normocephalic atraumatic. Oropharynx clear.  Eyes: No scleral icterus. Normal conjunctiva. Heart: Regular rate and rhythm. No murmurs appreciated. No lower extremity edema. Lungs: Clear auscultation bilaterally. No rales, rhonchi, or wheezing. Abdomen: Soft, nontender, nondistended. No palpable organomegaly. No rebound or guarding. MSK: Normal ROM of the left hip. Neuro: No focal deficits. Psych: Normal mood and affect.   Advanced Directives 01/21/2019 12/15/2018 04/24/2017 04/17/2017 04/12/2017 03/22/2017 03/18/2017  Does Patient Have a Medical Advance Directive? Yes No;Yes Yes Yes Yes Yes Yes  Type of Paramedic of [place];Living will [place];Living will Living will Living will Living will Living will Living will  Does patient want to make changes to medical advance directive? No - Patient declined - - - - - -  Copy of [place] in Chart? No - copy requested - - - - - -  Pre-existing out of facility DNR order (yellow form or pink MOST form) - - - - - - -    Current Medications (verified) Outpatient Encounter Medications as of 01/05/2021  Medication Sig   ALPRAZolam (XANAX) 0.25 MG tablet TAKE 1/2 TO 1 TABLET DAILY AS NEEDED FOR ANXIETY.   aspirin EC 81 MG tablet Take 1 tablet (81  mg total) by mouth daily. Swallow whole.   atorvastatin (LIPITOR) 40 MG tablet Take 1 tablet (40 mg total) by mouth daily.   Flaxseed, Linseed, (FLAX PO) Take 1 Dose by mouth every morning.    hydrochlorothiazide (MICROZIDE) 12.5 MG capsule Take 1 capsule (12.5 mg total) by mouth daily.   losartan (COZAAR) 50 MG tablet TAKE 1 TABLET BY MOUTH EVERY MORNING.   metoprolol tartrate (LOPRESSOR) 50 MG tablet TAKE (1)TABLET TWICE DAILY.   Multiple Vitamins-Minerals (CENTRUM SILVER) tablet Take 1 tablet by mouth daily.   OMEGA 3 1000 MG CAPS Take 1,000 mg by mouth at bedtime.    OVER THE COUNTER MEDICATION Calcium 600mg  one daily Vitamin D 2,000 iu daily   Probiotic Product (PROBIOTIC DAILY PO) Take 1 tablet by mouth daily.   Psyllium (METAMUCIL PO) Take 1 Scoop by mouth every evening.  (Patient not taking: Reported on 01/05/2021)   sodium chloride (OCEAN) 0.65 % SOLN nasal spray Place 1 spray into both nostrils daily as needed for congestion. (Patient not taking: Reported on 01/05/2021)   No facility-administered encounter medications on file as of 01/05/2021.    Allergies (verified) Codeine and Penicillins   History: Past Medical History:  Diagnosis Date   Arthritis    Basal cell carcinoma 04/29/2019   right inner cheek [facility])   Basal cell carcinoma 04/29/2019   left side nose ([facility])   Chronic kidney disease    Diverticulosis of colon    Essential hypertension    History of basal cell carcinoma (BCC)  excision    02-10-2013  left cheek  s/p  moh's sx   History of palpitations    History of recurrent UTIs    History of syncope    2015-- felt to vasovagal response to pain (per cardiologist note)   IBS (irritable bowel syndrome)    Open wound of left knee    warm soaks daily /  neosprin and dressing daily / per per still has drainage   Patellar bursitis of left knee    pre-patella septic bursitis w/ foreign body   Right thyroid nodule    x2 right side incidental finding on carotid  duplex 03/ 2018-- thyroid ultrasound done , benign , follow-up in one year   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/29/20201   in situ-right upper arm-anterior   Squamous cell carcinoma of skin 11/04/2019   KA-right lower leg-anterior superior   Wears hearing aid in both ears    Past Surgical History:  Procedure Laterality Date   APPENDECTOMY  age 71   BREAST BIOPSY  1962   benign   CATARACT EXTRACTION W/PHACO  06/25/2011   Procedure: CATARACT EXTRACTION PHACO AND INTRAOCULAR LENS PLACEMENT ([place]);  Surgeon: Williams Che, MD;  Location: [facility];  Service: Ophthalmology;  Laterality: Right;  CDE: 8.90   CATARACT EXTRACTION W/PHACO Left 05/05/2012   Procedure: CATARACT EXTRACTION PHACO AND INTRAOCULAR LENS PLACEMENT ([facility]);  Surgeon: Williams Che, MD;  Location: [facility];  Service: Ophthalmology;  Laterality: Left;  CDE 12.78   COLONOSCOPY  last one 11-26-2012   EXCISION MORTON'S NEUROMA Right 2003  approx.   INCISION AND DRAINAGE WOUND WITH FOREIGN BODY REMOVAL Left 08/30/2016   Procedure: LEFT KNEE INCISION AND DRAINAGE WOUND WITH FOREIGN BODY REMOVAL;  Surgeon: Sydnee Cabal, MD;  Location: [facility];  Service: Orthopedics;  Laterality: Left;   KNEE ARTHROPLASTY Right 03/14/2017   Procedure: RIGHT TOTAL KNEE ARTHROPLASTY WITH COMPUTER NAVIGATION AND REMOVAL OF HARDWARE;  Surgeon: Rod Can, MD;  [facility];  Service: Orthopedics;  Laterality: Right;  Adductor Block   KNEE ARTHROSCOPY W/ ACL RECONSTRUCTION Right 1996   KNEE CARTILAGE SURGERY Right age 20   MOHS SURGERY  02/10/2013   left cheek -- BCC   POSTERIOR REPAIR  05-21-2002   dr Glo Herring   symptomatic recetocele   TONSILLECTOMY AND ADENOIDECTOMY  age 89   TRANSTHORACIC ECHOCARDIOGRAM  04-17-2016  dr Domenic Polite   ef 60-65%/  trivial MR/ mild TR   VAGINAL HYSTERECTOMY  1979   w/  Bilateral Salpingoophorectomy   Family History  Problem Relation Age of Onset   Hypertension Mother    CVA Mother    Throat  cancer Father    Pseudochol deficiency Neg Hx    Malignant hyperthermia Neg Hx    Hypotension Neg Hx    Anesthesia problems Neg Hx    Colon cancer Neg Hx    Esophageal cancer Neg Hx    Social History   Socioeconomic History   Marital status: Married    Spouse name: Not on file   Number of children: 2   Years of education: Not on file   Highest education level: Not on file  Occupational History   Occupation: retired  Tobacco Use   Smoking status: Former    Packs/day: 0.50    Years: 10.00    Pack years: 5.00    Types: Cigarettes    Quit date: 04/30/1962    Years since quitting: 58.7   Smokeless tobacco: Never  Vaping  Use   Vaping Use: Never used  Substance and Sexual Activity   Alcohol use: Yes    Alcohol/week: 14.0 standard drinks    Types: 14 Glasses of wine per week    Comment: 2 wine daily   Drug use: No   Sexual activity: Yes    Birth control/protection: Surgical  Other Topics Concern   Not on file  Social History Narrative   Not on file   Social Determinants of Health   Financial Resource Strain: Not on file  Food Insecurity: Not on file  Transportation Needs: Not on file  Physical Activity: Not on file  Stress: Not on file  Social Connections: Not on file    Tobacco Counseling Counseling given: Not Answered  Activities of Daily Living In your present state of health, do you have any difficulty performing the following activities: 01/04/2021  Hearing? Y  Vision? N  Difficulty concentrating or making decisions? N  Walking or climbing stairs? N  Dressing or bathing? N  Doing errands, shopping? N  Preparing Food and eating ? N  Using the Toilet? N  In the past six months, have you accidently leaked urine? Y  Do you have problems with loss of bowel control? N  Managing your Medications? N  Managing your Finances? N  Housekeeping or managing your Housekeeping? N  Some recent data might be hidden    Patient Care Team: Coral Spikes, DO as PCP -  General (Family Medicine) Satira Sark, MD as PCP - Cardiology (Cardiology) Lavonna Monarch, MD as Consulting Physician (Dermatology)     Assessment:   This is a routine wellness examination for [name].  Left hip pain - Hx of osteoarthritis.  Hearing/Vision screen No vision concerns. Has hearing aids.  Dietary issues and exercise activities discussed:  Patient has a healthy diet and is at a healthy weight. She is very active - Yoga 2x/week, Walks 1-2 miles daily.  Depression Screen PHQ 2/9 Scores 01/05/2021 06/24/2020 04/15/2020 12/25/2017 12/18/2016 08/31/2014  PHQ - 2 Score 0 0 0 0 0 0    Fall Risk Fall Risk  01/05/2021 01/04/2021 06/24/2020 04/15/2020 12/25/2017  Falls in the past year? 0 0 0 0 0  Number falls in past yr: 0 0 0 0 -  Injury with Fall? 0 0 0 0 -  Risk for fall due to : No Fall Risks - - - -  Risk for fall due to: Comment - - - - -  Follow up Falls evaluation completed - Falls evaluation completed Falls evaluation completed -    FALL RISK PREVENTION PERTAINING TO THE HOME: No risk for falls. No needs at this time.   ASSISTIVE DEVICES UTILIZED TO PREVENT FALLS: No assistive devices needed.   TIMED UP AND GO:  Was the test performed? No .   Cognitive Function: Normal Cognitive function. Passed [place].   Immunizations Immunization History  Administered Date(s) Administered   Fluad Quad(high Dose 65+) 11/03/2019   Influenza Split 12/24/2012   Influenza,inj,Quad PF,6+ Mos 12/15/2013, 12/16/2014, 12/13/2015, 12/18/2016, 12/25/2017   Influenza-Unspecified 01/06/2012   Moderna Sars-Covid-2 Vaccination 02/17/2019, 03/30/2019, 12/10/2019   Pneumococcal Conjugate-13 02/28/2017   Pneumococcal Polysaccharide-23 03/24/2020   Td 08/10/1997   Tdap 08/07/2016   Zoster, Live 04/26/2008    TDAP status: Up to date  Flu Vaccine status: Up to date  Pneumococcal vaccine status: Up to date  Covid-19 vaccine status: Completed vaccines  Shingrix Completed?:  Yes  Screening Tests Health Maintenance  Topic Date Due  Zoster Vaccines- Shingrix (1 of 2) Never done   COVID-19 Vaccine (4 - Booster for Moderna series) 02/04/2020   INFLUENZA VACCINE  09/05/2020   TETANUS/TDAP  08/08/2026   Pneumonia Vaccine 45+ Years old  Completed   DEXA SCAN  Completed   HPV VACCINES  Aged Out    Health Maintenance  Health Maintenance Due  Topic Date Due   Zoster Vaccines- Shingrix (1 of 2) Never done   COVID-19 Vaccine (4 - Booster for Moderna series) 02/04/2020   INFLUENZA VACCINE  09/05/2020    Colorectal cancer screening: No longer required.   Mammogram status: Completed 02/18/20. Repeat every year  Bone Density status: Completed 11/23/20. Results reflect: Bone density results: OSTEOPENIA. Repeat every 2 years.  Lung Cancer Screening: (Low Dose CT Chest recommended if Age 31-80 years, 30 pack-year currently smoking OR have quit w/in 15years.) does not qualify.   Additional Screening:  Hepatitis C Screening: does qualify; Completed - Unsure  Vision Screening: Recommended annual ophthalmology exams for early detection of glaucoma and other disorders of the eye.  Dental Screening: Recommended annual dental exams for proper oral hygiene  Community Resource Referral / Chronic Care Management: CRR required this visit?  No   CCM required this visit?  No     Plan:    Left Hip pain - Xray ordered today. Independently reviewed by me. Interpretation: Mild Left hip Osteoarthritis. Will discuss results with patient (returned after visit).  I have personally reviewed and noted the following in the patient's chart:   Medical and social history Use of alcohol, tobacco or illicit drugs  Current medications and supplements including opioid prescriptions.  Functional ability and status Nutritional status Physical activity Advanced directives List of other physicians Hospitalizations, surgeries, and ER visits in previous 12 months Vitals Screenings  to include cognitive, depression, and falls Referrals and appointments  In addition, I have reviewed and discussed with patient certain preventive protocols, quality metrics, and best practice recommendations. A written personalized care plan for preventive services as well as general preventive health recommendations were provided to patient.      Coral Spikes, DO   01/05/2021

## 2021-01-06 ENCOUNTER — Encounter: Payer: Self-pay | Admitting: Family Medicine

## 2021-01-20 ENCOUNTER — Encounter: Payer: Medicare Other | Admitting: Family Medicine

## 2021-01-24 ENCOUNTER — Other Ambulatory Visit (HOSPITAL_COMMUNITY): Payer: Self-pay | Admitting: Orthopedic Surgery

## 2021-01-24 ENCOUNTER — Ambulatory Visit (HOSPITAL_COMMUNITY)
Admission: RE | Admit: 2021-01-24 | Discharge: 2021-01-24 | Disposition: A | Discharge status: Home / Self Care | Payer: Medicare Other | Source: Ambulatory Visit | Attending: Orthopedic Surgery | Admitting: Orthopedic Surgery

## 2021-01-24 ENCOUNTER — Other Ambulatory Visit: Payer: Self-pay

## 2021-01-24 ENCOUNTER — Other Ambulatory Visit (HOSPITAL_COMMUNITY): Payer: Self-pay | Admitting: Family Medicine

## 2021-01-24 DIAGNOSIS — M4310 Spondylolisthesis, site unspecified: Secondary | ICD-10-CM

## 2021-01-24 DIAGNOSIS — Z1231 Encounter for screening mammogram for malignant neoplasm of breast: Secondary | ICD-10-CM

## 2021-02-01 ENCOUNTER — Inpatient Hospital Stay
Admission: RE | Admit: 2021-02-01 | Discharge: 2021-02-01 | Disposition: A | Discharge status: Home / Self Care | Payer: Self-pay | Source: Ambulatory Visit | Attending: Family Medicine | Admitting: Family Medicine

## 2021-02-01 ENCOUNTER — Other Ambulatory Visit (HOSPITAL_COMMUNITY): Payer: Self-pay | Admitting: Family Medicine

## 2021-02-01 DIAGNOSIS — Z1231 Encounter for screening mammogram for malignant neoplasm of breast: Secondary | ICD-10-CM

## 2021-02-03 ENCOUNTER — Emergency Department (HOSPITAL_COMMUNITY): Payer: Medicare Other

## 2021-02-03 ENCOUNTER — Other Ambulatory Visit: Payer: Self-pay

## 2021-02-03 ENCOUNTER — Emergency Department (HOSPITAL_COMMUNITY)
Admission: EM | Admit: 2021-02-03 | Discharge: 2021-02-03 | Disposition: A | Discharge status: Home / Self Care | Payer: Medicare Other | Attending: Emergency Medicine | Admitting: Emergency Medicine

## 2021-02-03 ENCOUNTER — Telehealth: Payer: Self-pay | Admitting: *Deleted

## 2021-02-03 ENCOUNTER — Encounter (HOSPITAL_COMMUNITY): Payer: Self-pay

## 2021-02-03 DIAGNOSIS — Z7982 Long term (current) use of aspirin: Secondary | ICD-10-CM | POA: Insufficient documentation

## 2021-02-03 DIAGNOSIS — I160 Hypertensive urgency: Secondary | ICD-10-CM | POA: Insufficient documentation

## 2021-02-03 DIAGNOSIS — Z96651 Presence of right artificial knee joint: Secondary | ICD-10-CM | POA: Diagnosis not present

## 2021-02-03 DIAGNOSIS — Z79899 Other long term (current) drug therapy: Secondary | ICD-10-CM | POA: Diagnosis not present

## 2021-02-03 DIAGNOSIS — R0789 Other chest pain: Secondary | ICD-10-CM | POA: Diagnosis present

## 2021-02-03 DIAGNOSIS — Z87891 Personal history of nicotine dependence: Secondary | ICD-10-CM | POA: Diagnosis not present

## 2021-02-03 DIAGNOSIS — I10 Essential (primary) hypertension: Secondary | ICD-10-CM | POA: Insufficient documentation

## 2021-02-03 DIAGNOSIS — Z85828 Personal history of other malignant neoplasm of skin: Secondary | ICD-10-CM | POA: Insufficient documentation

## 2021-02-03 LAB — CBC
HCT: 42 % (ref 36.0–46.0)
Hemoglobin: 14.2 g/dL (ref 12.0–15.0)
MCH: 32.9 pg (ref 26.0–34.0)
MCHC: 33.8 g/dL (ref 30.0–36.0)
MCV: 97.2 fL (ref 80.0–100.0)
Platelets: 205 10*3/uL (ref 150–400)
RBC: 4.32 MIL/uL (ref 3.87–5.11)
RDW: 12.3 % (ref 11.5–15.5)
WBC: 5.8 10*3/uL (ref 4.0–10.5)
nRBC: 0 % (ref 0.0–0.2)

## 2021-02-03 LAB — BASIC METABOLIC PANEL
Anion gap: 9 (ref 5–15)
BUN: 14 mg/dL (ref 8–23)
CO2: 26 mmol/L (ref 22–32)
Calcium: 9.4 mg/dL (ref 8.9–10.3)
Chloride: 99 mmol/L (ref 98–111)
Creatinine, Ser: 0.6 mg/dL (ref 0.44–1.00)
GFR, Estimated: >60 mL/min (ref >60)
Glucose, Bld: 100 mg/dL — ABNORMAL HIGH (ref 70–99)
Potassium: 4.2 mmol/L (ref 3.5–5.1)
Sodium: 134 mmol/L — ABNORMAL LOW (ref 135–145)

## 2021-02-03 LAB — TROPONIN I (HIGH SENSITIVITY)
Troponin I (High Sensitivity): 4 ng/L (ref <18)
Troponin I (High Sensitivity): 4 ng/L (ref <18)

## 2021-02-03 NOTE — Telephone Encounter (Signed)
I agree-there is no way to know for certain if this is due to esophageal spasms or heart

## 2021-02-03 NOTE — ED Triage Notes (Signed)
Pt presents to ED with complaints of chest tightness since 12/24. Pt states she was seen in the ED for esophageal spasms on 12/24 and awaiting to get into GI. Pt states since she has been having the chest tightness.

## 2021-02-03 NOTE — ED Provider Notes (Signed)
[facility] Provider Note   CSN: [id number] Arrival date & time: 02/03/21  1131     History Chief Complaint  Patient presents with   Chest Pain    [name] is a 80 y.o. female.  Pt is a 80 yo female presenting for chest pain. Patient admits to chest tightness with radiating to shoulder blade that started one week ago but increased in severity last night. States one week ago she was had just finished eating when she had tightness in her throat and upper chest lasting approx 30 minutes. Seen in ER w/ dx of esophogeal spasm, with recommendations for ENT follow up. Denies choking episode of food bolus sensation.   Denies f/c/n/v/cough. Denies sob.  Hx of HTN compliant with medications. Hx of smoking tobacco in 20s-none since. Denies hx of MI or DM.  Denies fevers, chills, cough. Denies sob.   The history is provided by the patient. No language interpreter was used.  Chest Pain Associated symptoms: no abdominal pain, no back pain, no cough, no fever, no palpitations, no shortness of breath and no vomiting       Past Medical History:  Diagnosis Date   Arthritis    Basal cell carcinoma 04/29/2019   right inner cheek [facility])   Basal cell carcinoma 04/29/2019   left side nose [facility])   Chronic kidney disease    Diverticulosis of colon    Essential hypertension    History of basal cell carcinoma (BCC) excision    02-10-2013  left cheek  s/p  moh's sx   History of palpitations    History of recurrent UTIs    History of syncope    2015-- felt to vasovagal response to pain (per cardiologist note)   IBS (irritable bowel syndrome)    Open wound of left knee    warm soaks daily /  neosprin and dressing daily / per per still has drainage   Patellar bursitis of left knee    pre-patella septic bursitis w/ foreign body   Right thyroid nodule    x2 right side incidental finding on carotid duplex 03/ 2018-- thyroid ultrasound done , benign , follow-up in one  year   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/29/20201   in situ-right upper arm-anterior   Squamous cell carcinoma of skin 11/04/2019   KA-right lower leg-anterior superior   Wears hearing aid in both ears     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   Hyponatremia 06/25/2020   Essential hypertension 03/25/2020   S/P knee surgery 08/30/2016   History of recurrent UTIs 09/19/2015   Osteopenia 05/21/2015   Esophageal reflux 02/15/2014    Past Surgical History:  Procedure Laterality Date   APPENDECTOMY  age 53   BREAST BIOPSY  1962   benign   CATARACT EXTRACTION W/PHACO  06/25/2011   Procedure: CATARACT EXTRACTION PHACO AND INTRAOCULAR LENS PLACEMENT ([place]);  Surgeon: Williams Che, MD;  Location[facility];  Service: Ophthalmology;  Laterality: Right;  CDE: 8.90   CATARACT EXTRACTION W/PHACO Left 05/05/2012   Procedure: CATARACT EXTRACTION PHACO AND INTRAOCULAR LENS PLACEMENT ([facility]);  Surgeon: Williams Che, MD;  Location: [facility];  Service: Ophthalmology;  Laterality: Left;  CDE 12.78   COLONOSCOPY  last one 11-26-2012   EXCISION MORTON'S NEUROMA Right 2003  approx.   INCISION AND DRAINAGE WOUND WITH FOREIGN BODY REMOVAL Left 08/30/2016   Procedure: LEFT KNEE  INCISION AND DRAINAGE WOUND WITH FOREIGN BODY REMOVAL;  Surgeon: Sydnee Cabal, MD;  Location: [facility];  Service: Orthopedics;  Laterality: Left;   KNEE ARTHROPLASTY Right 03/14/2017   Procedure: RIGHT TOTAL KNEE ARTHROPLASTY WITH COMPUTER NAVIGATION AND REMOVAL OF HARDWARE;  Surgeon: Rod Can, MD;  [facility];  Service: Orthopedics;  Laterality: Right;  Adductor Block   KNEE ARTHROSCOPY W/ ACL RECONSTRUCTION Right 1996   KNEE CARTILAGE SURGERY Right age 36   MOHS SURGERY  02/10/2013   left cheek -- [facility]   POSTERIOR REPAIR  05-21-2002   dr Glo Herring   symptomatic recetocele   TONSILLECTOMY AND ADENOIDECTOMY  age  43   TRANSTHORACIC ECHOCARDIOGRAM  04-17-2016  dr Domenic Polite   ef 60-65%/  trivial MR/ mild TR   VAGINAL HYSTERECTOMY  1979   w/  Bilateral Salpingoophorectomy     OB History   No obstetric history on file.     Family History  Problem Relation Age of Onset   Hypertension Mother    CVA Mother    Throat cancer Father    Pseudochol deficiency Neg Hx    Malignant hyperthermia Neg Hx    Hypotension Neg Hx    Anesthesia problems Neg Hx    Colon cancer Neg Hx    Esophageal cancer Neg Hx     Social History   Tobacco Use   Smoking status: Former    Packs/day: 0.50    Years: 10.00    Pack years: 5.00    Types: Cigarettes    Quit date: 04/30/1962    Years since quitting: 58.8   Smokeless tobacco: Never  Vaping Use   Vaping Use: Never used  Substance Use Topics   Alcohol use: Yes    Alcohol/week: 14.0 standard drinks    Types: 14 Glasses of wine per week    Comment: 2 wine daily   Drug use: No    Home Medications Prior to Admission medications   Medication Sig Start Date End Date Taking? Authorizing Provider  ALPRAZolam (XANAX) 0.25 MG tablet TAKE 1/2 TO 1 TABLET DAILY AS NEEDED FOR ANXIETY. 09/23/20   Nilda Simmer, NP  aspirin EC 81 MG tablet Take 1 tablet (81 mg total) by mouth daily. Swallow whole. 07/15/20   Lendon Colonel, NP  atorvastatin (LIPITOR) 40 MG tablet Take 1 tablet (40 mg total) by mouth daily. 01/05/21 04/05/21  Coral Spikes, DO  Flaxseed, Linseed, (FLAX PO) Take 1 Dose by mouth every morning.     [provider]  losartan (COZAAR) 50 MG tablet Take 1 tablet (50 mg total) by mouth every morning. 01/05/21   Coral Spikes, DO  metoprolol tartrate (LOPRESSOR) 50 MG tablet TAKE (1)TABLET TWICE DAILY. 01/05/21   Coral Spikes, DO  Multiple Vitamins-Minerals (CENTRUM SILVER) tablet Take 1 tablet by mouth daily.    [provider]  OMEGA 3 1000 MG CAPS Take 1,000 mg by mouth at bedtime.     [provider]  OVER THE COUNTER MEDICATION  Calcium 600mg  one daily Vitamin D 2,000 iu daily    [provider]  Probiotic Product (PROBIOTIC DAILY PO) Take 1 tablet by mouth daily.    [provider]  Psyllium (METAMUCIL PO) Take 1 Scoop by mouth every evening.     [provider]    Allergies    Codeine and Penicillins  Review of Systems   Review of Systems  Constitutional:  Negative for chills and fever.  HENT:  Negative for ear pain and sore throat.   Eyes:  Negative for pain and visual disturbance.  Respiratory:  Negative for cough and shortness of breath.   Cardiovascular:  Positive for chest pain. Negative for palpitations.  Gastrointestinal:  Negative for abdominal pain and vomiting.  Genitourinary:  Negative for dysuria and hematuria.  Musculoskeletal:  Negative for arthralgias and back pain.  Skin:  Negative for color change and rash.  Neurological:  Negative for seizures and syncope.  All other systems reviewed and are negative.  Physical Exam Updated Vital Signs BP (!) 173/93    Pulse 65    Temp 98.1 F (36.7 C) (Oral)    Resp 13    Ht 5' (1.524 m)    Wt 51.3 kg    LMP  (LMP Unknown)    SpO2 99%    BMI 22.07 kg/m   Physical Exam Vitals and nursing note reviewed.  Constitutional:      General: She is not in acute distress.    Appearance: She is well-developed.  HENT:     Head: Normocephalic and atraumatic.  Eyes:     Conjunctiva/sclera: Conjunctivae normal.  Cardiovascular:     Rate and Rhythm: Normal rate and regular rhythm.     Heart sounds: No murmur heard. Pulmonary:     Effort: Pulmonary effort is normal. No respiratory distress.     Breath sounds: Normal breath sounds.  Chest:     Comments: No reproducible chest pain Abdominal:     Palpations: Abdomen is soft.     Tenderness: There is no abdominal tenderness.  Musculoskeletal:        General: No swelling.     Cervical back: Neck supple.  Skin:    General: Skin is warm and dry.     Capillary Refill: Capillary  refill takes less than 2 seconds.  Neurological:     Mental Status: She is alert.  Psychiatric:        Mood and Affect: Mood normal.    ED Results / Procedures / Treatments   Labs (all labs ordered are listed, but only abnormal results are displayed) Labs Reviewed  BASIC METABOLIC PANEL - Abnormal; Notable for the following components:      Result Value   Sodium 134 (*)    Glucose, Bld 100 (*)    All other components within normal limits  CBC  TROPONIN I (HIGH SENSITIVITY)  TROPONIN I (HIGH SENSITIVITY)    EKG EKG Interpretation  Date/Time:  Friday February 03 2021 11:40:13 EST Ventricular Rate:  68 PR Interval:  176 QRS Duration: 70 QT Interval:  362 QTC Calculation: 384 R Axis:   74 Text Interpretation: Normal sinus rhythm Confirmed by Campbell Stall ([id number]) on 51/88/4166 2:44:05 PM  Radiology DG Chest 2 View  Result Date: 02/03/2021 CLINICAL DATA:  Chest pain. EXAM: CHEST - 2 VIEW COMPARISON:  None. FINDINGS: The heart size and mediastinal contours are within normal limits. Both lungs are clear. The visualized skeletal structures are unremarkable. IMPRESSION: No active cardiopulmonary disease. Electronically Signed   By: Marijo Conception M.D.   On: 02/03/2021 12:56    Procedures Procedures   Medications Ordered in ED Medications - No data to display  ED Course  I have reviewed the triage vital signs and the nursing notes.  Pertinent labs & imaging results that were available during my care of the patient were reviewed by me and considered in my medical decision making (see chart for details).    MDM  Rules/Calculators/A&P                          The patient's chest pain is not suggestive of pulmonary embolus, cardiac ischemia, aortic dissection, pericarditis, myocarditis, pulmonary embolism, pneumothorax, pneumonia, Zoster, or esophageal perforation, or other serious etiology.  Historically not abrupt in onset, tearing or ripping, pulses symmetric. EKG nonspecific  for ischemia/infarction. No dysrhythmias, brugada, WPW, prolonged QT noted. Troponin negative. CXR reviewed. Labs without demonstration of acute pathology unless otherwise noted above. Low HEART Score: 0-3 points (0.9-1.7% risk of MACE).] Given the extremely low risk of these diagnoses further testing and evaluation for these possibilities does not appear to be indicated at this time.   Pt hypertensive in ED with without evidence of end organ ischemia. Recommended to take home meds upon discharge and f/u with pcp for further bp management.  Patient in no distress and overall condition improved here in the ED. Detailed discussions were had with the patient regarding current findings, and need for close f/u with PCP or on call doctor. The patient has been instructed to return immediately if the symptoms worsen in any way for re-evaluation. Patient verbalized understanding and is in agreement with current care plan. All questions answered prior to discharge.     Final Clinical Impression(s) / ED Diagnoses Final diagnoses:  Hypertensive urgency    Rx / DC Orders ED Discharge Orders     None        Lianne Cure, DO 16/83/72 [id number]

## 2021-02-03 NOTE — Telephone Encounter (Signed)
Patient states she was in [place] during [place] and ended up in ER with esophageal spasm that lasted 25 minutes but since then she has been having tightness in chest and lots of heartburn feeling- woke up last night with pain moving into shoulder. Advised patient to go to the ER for evaluation and treatment. Patient verbalized understanding and stated she will head to ER

## 2021-02-07 ENCOUNTER — Other Ambulatory Visit: Payer: Self-pay

## 2021-02-07 ENCOUNTER — Encounter: Payer: Self-pay | Admitting: Family Medicine

## 2021-02-07 ENCOUNTER — Ambulatory Visit (INDEPENDENT_AMBULATORY_CARE_PROVIDER_SITE_OTHER): Payer: Medicare Other | Admitting: Family Medicine

## 2021-02-07 DIAGNOSIS — I10 Essential (primary) hypertension: Secondary | ICD-10-CM | POA: Diagnosis not present

## 2021-02-07 MED ORDER — LOSARTAN POTASSIUM 100 MG PO TABS: 100.0000 mg | ORAL_TABLET | Freq: Every morning | ORAL | 3 refills | Status: DC

## 2021-02-07 NOTE — Progress Notes (Signed)
Subjective:  Patient ID: [name], female    DOB: 11-10-1940  Age: 81 y.o. MRN: [id number]  CC: Chief Complaint  Patient presents with   Hospitalization Follow-up    ED on 02/03/21; went to [place] over the holidays and went to ER there. Pt was in restaurant and throat began to close up. Pt did have chest tightness and pain in left shoulder; went to ER again due to that. BP elevated and ER states pt needed to get that under control.    HPI:  81 year old female with hypertension presents for ER follow-up.  Patient was recently seen in the ER on 12/30.  Presented with chest pain.  Work-up was negative.  Found to be hypertensive.  Patient states that her blood pressures have been trending up.  Blood pressure today is 150/96.  Patient states that she has elevated readings when she is in the office and in healthcare institutions.  She is compliant with metoprolol and losartan.  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   Hyponatremia 06/25/2020   Essential hypertension 03/25/2020   S/P knee surgery 08/30/2016   History of recurrent UTIs 09/19/2015   Osteopenia 05/21/2015   Esophageal reflux 02/15/2014    Social Hx   Social History   Socioeconomic History   Marital status: Married    Spouse name: Not on file   Number of children: 2   Years of education: Not on file   Highest education level: Not on file  Occupational History   Occupation: retired  Tobacco Use   Smoking status: Former    Packs/day: 0.50    Years: 10.00    Pack years: 5.00    Types: Cigarettes    Quit date: 04/30/1962    Years since quitting: 58.8   Smokeless tobacco: Never  Vaping Use   Vaping Use: Never used  Substance and Sexual Activity   Alcohol use: Yes    Alcohol/week: 14.0 standard drinks    Types: 14 Glasses of wine per week    Comment: 2 wine daily   Drug use: No   Sexual activity: Yes    Birth  control/protection: Surgical  Other Topics Concern   Not on file  Social History Narrative   Not on file   Social Determinants of Health   Financial Resource Strain: Not on file  Food Insecurity: Not on file  Transportation Needs: Not on file  Physical Activity: Not on file  Stress: Not on file  Social Connections: Not on file    Review of Systems  Respiratory: Negative.    Cardiovascular: Negative.     Objective:  BP (!) 150/96    Pulse 68    Temp 98.1 F (36.7 C)    Wt 117 lb 3.2 oz (53.2 kg)    LMP  (LMP Unknown)    SpO2 97%    BMI 22.89 kg/m   BP/Weight 02/07/2021 02/03/2021 89/03/1192  Systolic BP 174 081 448  Diastolic BP 96 93 98  Wt. (Lbs) 117.2 113 115  BMI 22.89 22.07 22.46    Physical Exam Vitals and nursing note reviewed.  Constitutional:      General: She is not in acute distress.    Appearance: Normal appearance. She is not ill-appearing.  HENT:     Head: Normocephalic and atraumatic.  Cardiovascular:     Rate and Rhythm: Normal rate and regular rhythm.  Pulmonary:  Effort: Pulmonary effort is normal.     Breath sounds: Normal breath sounds. No wheezing, rhonchi or rales.  Neurological:     Mental Status: She is alert.  Psychiatric:        Mood and Affect: Mood normal.        Behavior: Behavior normal.    Lab Results  Component Value Date   WBC 5.8 02/03/2021   HGB 14.2 02/03/2021   HCT 42.0 02/03/2021   PLT 205 02/03/2021   GLUCOSE 100 (H) 02/03/2021   CHOL 181 06/22/2020   TRIG 53 06/22/2020   HDL 102 06/22/2020   LDLCALC 68 06/22/2020   ALT 38 (H) 06/22/2020   AST 27 06/22/2020   NA 134 (L) 02/03/2021   K 4.2 02/03/2021   CL 99 02/03/2021   CREATININE 0.60 02/03/2021   BUN 14 02/03/2021   CO2 26 02/03/2021   TSH 2.270 06/03/2020   INR 0.97 02/26/2017     Assessment & Plan:   Problem List Items Addressed This Visit       Cardiovascular and Mediastinum   Essential hypertension    Recent hypertensive urgency.  BP  elevated today but reasonably well controlled at her age.  Increasing losartan.      Relevant Medications   losartan (COZAAR) 100 MG tablet    Meds ordered this encounter  Medications   losartan (COZAAR) 100 MG tablet    Sig: Take 1 tablet (100 mg total) by mouth every morning.    Dispense:  90 tablet    Refill:  3    Follow-up:  Return in about 3 months (around 05/08/2021) for HTN follow up.  [place]

## 2021-02-07 NOTE — Patient Instructions (Signed)
Increase Losartan to 100 mg daily.  Follow up labs in 1 month.  Take care  Dr. Lacinda Axon

## 2021-02-07 NOTE — Assessment & Plan Note (Signed)
Recent hypertensive urgency.  BP elevated today but reasonably well controlled at her age.  Increasing losartan.

## 2021-02-08 DIAGNOSIS — R1314 Dysphagia, pharyngoesophageal phase: Secondary | ICD-10-CM | POA: Insufficient documentation

## 2021-02-08 DIAGNOSIS — J3489 Other specified disorders of nose and nasal sinuses: Secondary | ICD-10-CM | POA: Diagnosis not present

## 2021-02-08 DIAGNOSIS — Z87891 Personal history of nicotine dependence: Secondary | ICD-10-CM | POA: Diagnosis not present

## 2021-02-20 ENCOUNTER — Other Ambulatory Visit: Payer: Self-pay

## 2021-02-20 ENCOUNTER — Ambulatory Visit (HOSPITAL_COMMUNITY)
Admission: RE | Admit: 2021-02-20 | Discharge: 2021-02-20 | Disposition: A | Discharge status: Home / Self Care | Payer: Medicare Other | Source: Ambulatory Visit | Attending: Family Medicine | Admitting: Family Medicine

## 2021-02-20 DIAGNOSIS — Z1231 Encounter for screening mammogram for malignant neoplasm of breast: Secondary | ICD-10-CM | POA: Diagnosis not present

## 2021-02-23 ENCOUNTER — Other Ambulatory Visit (HOSPITAL_COMMUNITY): Payer: Self-pay | Admitting: Orthopedic Surgery

## 2021-02-23 ENCOUNTER — Other Ambulatory Visit: Payer: Self-pay

## 2021-02-23 ENCOUNTER — Ambulatory Visit (HOSPITAL_COMMUNITY)
Admission: RE | Admit: 2021-02-23 | Discharge: 2021-02-23 | Disposition: A | Discharge status: Home / Self Care | Payer: Medicare Other | Source: Ambulatory Visit | Attending: Orthopedic Surgery | Admitting: Orthopedic Surgery

## 2021-02-23 DIAGNOSIS — Z981 Arthrodesis status: Secondary | ICD-10-CM

## 2021-02-23 DIAGNOSIS — M4316 Spondylolisthesis, lumbar region: Secondary | ICD-10-CM | POA: Diagnosis not present

## 2021-02-27 ENCOUNTER — Encounter: Payer: Self-pay | Admitting: Physician Assistant

## 2021-02-27 ENCOUNTER — Ambulatory Visit: Payer: Medicare Other | Admitting: Physician Assistant

## 2021-02-27 VITALS — BP 190/100 | HR 68 | Ht 60.0 in | Wt 118.1 lb

## 2021-02-27 DIAGNOSIS — K224 Dyskinesia of esophagus: Secondary | ICD-10-CM

## 2021-02-27 DIAGNOSIS — R0989 Other specified symptoms and signs involving the circulatory and respiratory systems: Secondary | ICD-10-CM

## 2021-02-27 NOTE — Progress Notes (Signed)
Subjective:    Patient ID: [name], female    DOB: [birth year], 81 y.o.   MRN: [id number]  HPI [name] is a pleasant 81 year old white female, established with [name], who was last seen here in 2020.  She comes back in today after a recent episode that occurred at Christmas time with what she describes as a spasm in her esophagus.  This occurred while they were out of town, had eaten dinner on Christmas eve but she says she did not have any difficulty swallowing or have any sensation that she had any food in her esophagus.  She took a swallow of water and then immediately felt a constriction type of sensation in her lower throat/upper esophagus that was then associated with a choking strangling sensation.  She says this lasted in total for about 30 minutes and she produced lot of clear saliva and mucus then eventually it resolved. She was seen in an emergency room there [facility]) but says she did not have any testing done, and that her symptoms were attributed to a transient food impaction by her report. She says she has had 2 other very mild episodes in the past 8 months that have both occurred with liquids and She has not had any further episodes since the 1 that occurred on Christmas Eve.  She has intermittently been having a tightness sensation in the upper chest, esophagus area.  She also says that she was evaluated in the emergency room at [facility] earlier this month because she got worried about her heart and that work-up was all negative.  Her appetite has been fine, weight has been stable.  She says she may have had some increased burping recently but has not had any heartburn or indigestion, has no complaints of nausea and no abdominal pain. She was seen by ENT/[name] on 02/08/2021 and underwent laryngoscopy which was negative and was told that she needed to see gastroenterology. She has not had prior EGD. Last colonoscopy 2014 with mild sigmoid diverticulosis, she had  a diminutive polyp in the sigmoid colon and small internal hemorrhoids.  Random biopsies were done which were negative and the polyp was benign polypoid mucosa. Other medical problems include hypertension, IBS, she says she has not had a goiter but has had 2 small thyroid nodules which are being followed by her PCP.  Review of Systems Pertinent positive and negative review of systems were noted in the above HPI section.  All other review of systems was otherwise negative.   Outpatient Encounter Medications as of 02/27/2021  Medication Sig   ALPRAZolam (XANAX) 0.25 MG tablet TAKE 1/2 TO 1 TABLET DAILY AS NEEDED FOR ANXIETY.   aspirin EC 81 MG tablet Take 1 tablet (81 mg total) by mouth daily. Swallow whole.   atorvastatin (LIPITOR) 40 MG tablet Take 1 tablet (40 mg total) by mouth daily.   Cholecalciferol (VITAMIN D) 50 MCG (2000 UT) tablet Take 1 tablet by mouth daily.   Flaxseed, Linseed, (FLAX PO) Take 1 Dose by mouth every morning.    losartan (COZAAR) 100 MG tablet Take 1 tablet (100 mg total) by mouth every morning.   metoprolol tartrate (LOPRESSOR) 50 MG tablet TAKE (1)TABLET TWICE DAILY.   Multiple Vitamins-Minerals (CENTRUM SILVER) tablet Take 1 tablet by mouth daily.   OMEGA 3 1000 MG CAPS Take 1,000 mg by mouth at bedtime.    OVER THE COUNTER MEDICATION Calcium 600mg  one daily Vitamin D 2,000 iu daily   Probiotic Product (PROBIOTIC  DAILY PO) Take 1 tablet by mouth daily.   Psyllium (METAMUCIL PO) Take 1 Scoop by mouth every evening.    No facility-administered encounter medications on file as of 02/27/2021.   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   Hyponatremia 06/25/2020   Essential hypertension 03/25/2020   S/P knee surgery 08/30/2016   History of  recurrent UTIs 09/19/2015   Osteopenia 05/21/2015   Esophageal reflux 02/15/2014   Social History   Socioeconomic History   Marital status: Married    Spouse name: Not on file   Number of children: 2   Years of education: Not on file   Highest education level: Not on file  Occupational History   Occupation: retired  Tobacco Use   Smoking status: Former    Packs/day: 0.50    Years: 10.00    Pack years: 5.00    Types: Cigarettes    Quit date: 04/30/1962    Years since quitting: 58.8   Smokeless tobacco: Never  Vaping Use   Vaping Use: Never used  Substance and Sexual Activity   Alcohol use: Yes    Alcohol/week: 14.0 standard drinks    Types: 14 Glasses of wine per week    Comment: 2 wine daily   Drug use: No   Sexual activity: Yes    Birth control/protection: Surgical  Other Topics Concern   Not on file  Social History Narrative   Not on file   Social Determinants of Health   Financial Resource Strain: Not on file  Food Insecurity: Not on file  Transportation Needs: Not on file  Physical Activity: Not on file  Stress: Not on file  Social Connections: Not on file  Intimate Partner Violence: Not on file    [name]'s family history includes CVA in her mother; Hypertension in her mother; Throat cancer in her father.      Objective:    Vitals:   02/27/21 1049  BP: (!) 190/100  Pulse: 68    Physical Exam Well-developed well-nourished eld WF  in no acute distress.  Height, Weight, 118 BMI 23.07  HEENT; nontraumatic normocephalic, EOMI, PE R LA, sclera anicteric. Oropharynx; not examined Neck; supple, no JVD, no palpable thyromegaly or mass Cardiovascular; regular rate and rhythm with S1-S2, no murmur rub or gallop Pulmonary; Clear bilaterally Abdomen; soft, nontender, nondistended, no palpable mass or hepatosplenomegaly, bowel sounds are active Rectal; not done today Skin; benign exam, no jaundice rash or appreciable lesions Extremities; no clubbing  cyanosis or edema skin warm and dry Neuro/Psych; alert and oriented x4, grossly nonfocal mood and affect appropriate        Assessment & Plan:   #36 81 year old white female with an episode of choking/strangling on water occurring on 01/28/2021 after she had eaten a meal without any sense of any dysphagia.  She says she felt a constriction type of sensation with swallowing water after the meal and then had about a 30-minute episode of tightness in the esophagus/throat accompanied by a lot of production of mucus/saliva which eventually resolved.  2 similar but much less severe episodes over the past 8 months, no recurrence since the episode on 01/28/2021 though she has intermittently had a tightness sensation in that area not necessarily associated with eating or drinking.  Negative laryngoscopy 02/08/2021  Etiology of symptoms is not entirely clear, she certainly may have had an episode of esophageal spasm, consider underlying motility disorder  No chronic GERD symptoms  #2 hypertension 3.  History of IBS 4.  Thyroid nodules followed by PCP 5.  Diverticulosis  Plan; Will schedule for barium swallow with tablet as initial evaluation.  Further recommendations pending findings on barium swallow. Recommended consuming room temperature liquids  Donette Mainwaring S Layman Gully PA-C 02/27/2021   Cc: Coral Spikes, DO

## 2021-02-27 NOTE — Patient Instructions (Addendum)
If you are age 82 or older, your body mass index should be between 23-30. Your Body mass index is 23.07 kg/m. If this is out of the aforementioned range listed, please consider follow up with your Primary Care Provider. ________________________________________________________  The [facility] providers would like to encourage you to use [facility] to communicate with providers for non-urgent requests or questions.  Due to long hold times on the telephone, sending your provider a message by [facility] may be a faster and more efficient way to get a response.  Please allow 48 business hours for a response.  Please remember that this is for non-urgent requests.  _______________________________________________________  [name] have been scheduled for a Barium Esophogram at [facility] (1st floor of the hospital) on 03/20/2021 at 9:30 am. Please arrive 30 minutes prior to your appointment for registration. Make certain not to have anything to eat or drink 3 hours prior to your test. If you need to reschedule for any reason, please contact radiology at [phone] to do so. __________________________________________________________________ A barium swallow is an examination that concentrates on views of the esophagus. This tends to be a double contrast exam (barium and two liquids which, when combined, create a gas to distend the wall of the oesophagus) or single contrast (non-ionic iodine based). The study is usually tailored to your symptoms so a good history is essential. Attention is paid during the study to the form, structure and configuration of the esophagus, looking for functional disorders (such as aspiration, dysphagia, achalasia, motility and reflux) EXAMINATION You may be asked to change into a gown, depending on the type of swallow being performed. A radiologist and radiographer will perform the procedure. The radiologist will advise you of the type of contrast selected for your procedure and  direct you during the exam. You will be asked to stand, sit or lie in several different positions and to hold a small amount of fluid in your mouth before being asked to swallow while the imaging is performed .In some instances you may be asked to swallow barium coated marshmallows to assess the motility of a solid food bolus. The exam can be recorded as a digital or video fluoroscopy procedure. POST PROCEDURE It will take 1-2 days for the barium to pass through your system. To facilitate this, it is important, unless otherwise directed, to increase your fluids for the next 24-48hrs and to resume your normal diet.  This test typically takes about 30 minutes to perform. ____________________________________________________________  Follow up pending the results of Barium Swallow.  Thank you for entrusting me with your care and choosing [facility].  Amy Esterwood, PA-C

## 2021-02-27 NOTE — Progress Notes (Signed)
Agree with assessment and plan as outlined.  

## 2021-03-03 ENCOUNTER — Other Ambulatory Visit: Payer: Self-pay | Admitting: Nurse Practitioner

## 2021-03-06 ENCOUNTER — Other Ambulatory Visit: Payer: Self-pay

## 2021-03-06 ENCOUNTER — Ambulatory Visit (HOSPITAL_COMMUNITY)
Admission: RE | Admit: 2021-03-06 | Discharge: 2021-03-06 | Disposition: A | Discharge status: Home / Self Care | Payer: Medicare Other | Source: Ambulatory Visit | Attending: Physician Assistant | Admitting: Physician Assistant

## 2021-03-06 DIAGNOSIS — R0989 Other specified symptoms and signs involving the circulatory and respiratory systems: Secondary | ICD-10-CM | POA: Insufficient documentation

## 2021-03-06 DIAGNOSIS — K224 Dyskinesia of esophagus: Secondary | ICD-10-CM | POA: Diagnosis not present

## 2021-03-07 ENCOUNTER — Other Ambulatory Visit: Payer: Self-pay

## 2021-03-07 MED ORDER — HYOSCYAMINE SULFATE 0.125 MG PO TABS
0.1250 mg | ORAL_TABLET | Freq: Two times a day (BID) | ORAL | 3 refills | Status: DC
Start: 2021-03-07 — End: 2021-05-31

## 2021-03-20 ENCOUNTER — Ambulatory Visit (HOSPITAL_COMMUNITY): Payer: Medicare Other

## 2021-03-28 DIAGNOSIS — Z981 Arthrodesis status: Secondary | ICD-10-CM | POA: Diagnosis not present

## 2021-03-29 ENCOUNTER — Other Ambulatory Visit: Payer: Self-pay

## 2021-03-29 ENCOUNTER — Encounter: Payer: Self-pay | Admitting: Dermatology

## 2021-03-29 ENCOUNTER — Ambulatory Visit: Payer: Medicare Other | Admitting: Dermatology

## 2021-03-29 DIAGNOSIS — D3617 Benign neoplasm of peripheral nerves and autonomic nervous system of trunk, unspecified: Secondary | ICD-10-CM

## 2021-03-29 DIAGNOSIS — D2322 Other benign neoplasm of skin of left ear and external auricular canal: Secondary | ICD-10-CM | POA: Diagnosis not present

## 2021-03-29 DIAGNOSIS — Z1283 Encounter for screening for malignant neoplasm of skin: Secondary | ICD-10-CM

## 2021-03-29 DIAGNOSIS — L570 Actinic keratosis: Secondary | ICD-10-CM | POA: Diagnosis not present

## 2021-03-29 DIAGNOSIS — L814 Other melanin hyperpigmentation: Secondary | ICD-10-CM | POA: Diagnosis not present

## 2021-03-29 DIAGNOSIS — Z85828 Personal history of other malignant neoplasm of skin: Secondary | ICD-10-CM

## 2021-03-29 DIAGNOSIS — L821 Other seborrheic keratosis: Secondary | ICD-10-CM | POA: Diagnosis not present

## 2021-03-29 DIAGNOSIS — D3610 Benign neoplasm of peripheral nerves and autonomic nervous system, unspecified: Secondary | ICD-10-CM

## 2021-04-10 ENCOUNTER — Encounter: Payer: Self-pay | Admitting: Dermatology

## 2021-04-10 NOTE — Progress Notes (Signed)
° °  Follow-Up Visit   Subjective  [name] is a 81 y.o. female who presents for the following: Annual Exam (Lesion on right ear x 1 month- ruff patch. Lesion on right cheek x year. Lesion on left thigh x 2 weeks. Lesion ion right shin x 5 months. Lesion on right breast x years. Lesion on upper back x years. Lesion on right rib x 3 months. History of scc and bcc. ).  General skin examination, multiple areas patient would like checked Location:  Duration:  Quality:  Associated Signs/Symptoms: Modifying Factors:  Severity:  Timing: Context:   Objective  Well appearing patient in no apparent distress; mood and affect are within normal limits. General skin examination: No atypical pigmented spots or nonmelanoma skin cancer.  Left Thigh - Anterior, Neck - Anterior, Right Flank, Right Shin Flattopped tan to brown 4 to 8 mm textured papules with typical dermoscopy  Left Ear 3 mm nontender cartilaginous thickening  Right Ear This may have been previously frozen and recurred.  We will repeat LN2 but should it again recur biopsy will be indicated  Right Breast Soft partially compressible 6 mm pink dermal papule  Left Thigh - Anterior 4 mm monochrome symmetric light brown macule, no dermoscopic atypia    A full examination was performed including scalp, head, eyes, ears, nose, lips, neck, chest, axillae, abdomen, back, buttocks, bilateral upper extremities, bilateral lower extremities, hands, feet, fingers, toes, fingernails, and toenails. All findings within normal limits unless otherwise noted below.  Areas beneath undergarments not fully examined   Assessment & Plan    Screening exam for skin cancer  Annual skin examination  Seborrheic keratosis (4) Neck - Anterior; Left Thigh - Anterior; Right Shin; Right Flank  Leave if stable  Weathering nodule of left ear Left Ear  No intervention indicated  Actinic keratosis Right Ear  Destruction of lesion - Right  Ear Complexity: simple   Destruction method: cryotherapy   Informed consent: discussed and consent obtained   Lesion destroyed using liquid nitrogen: Yes   Cryotherapy cycles:  5 Outcome: patient tolerated procedure well with no complications    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  The documentation on 04/10/21 for the exam, diagnosis, procedures, and orders are all accurate and complete.

## 2021-05-12 ENCOUNTER — Ambulatory Visit: Payer: Medicare Other | Admitting: Adult Health

## 2021-05-24 ENCOUNTER — Telehealth: Payer: Self-pay | Admitting: Adult Health

## 2021-05-24 NOTE — Telephone Encounter (Signed)
Patient called in asking if [name] would like for patient to have any labs drawn prior to her upcoming appointment on 06/02/21. ? ?Will forward to [employer]. ? ?If labs are ordered please send to [facility] to be drawn as this is closer for patient. ? ? ?

## 2021-05-24 NOTE — Telephone Encounter (Signed)
?  Pt would like to ask [name] if she needs to get labs done before her appt with her. She said, if she does need lab work to send order to [facility] because she is closer there ?

## 2021-05-25 ENCOUNTER — Telehealth: Payer: Self-pay | Admitting: Family Medicine

## 2021-05-25 DIAGNOSIS — Z981 Arthrodesis status: Secondary | ICD-10-CM

## 2021-05-25 NOTE — Telephone Encounter (Signed)
Pt would like referral faxed to Dr.Douglas Tally Joe at [facility] . Pt had spinal fusion surgery at the end of July. Please advise. Thank you. ?Pt would like a call back once referral faxed.  ?[phone] fax number.  ?

## 2021-05-26 ENCOUNTER — Other Ambulatory Visit: Payer: Self-pay

## 2021-05-26 ENCOUNTER — Telehealth: Payer: Self-pay | Admitting: Family Medicine

## 2021-05-26 DIAGNOSIS — I10 Essential (primary) hypertension: Secondary | ICD-10-CM

## 2021-05-26 DIAGNOSIS — E782 Mixed hyperlipidemia: Secondary | ICD-10-CM

## 2021-05-26 DIAGNOSIS — R002 Palpitations: Secondary | ICD-10-CM

## 2021-05-26 NOTE — Telephone Encounter (Signed)
Pt having lower abdominal pain since Wednesday with change in bowel movements. No appt available today-advised pt to go to Urgent Care. Pt states if it keeps on she will go to Urgent Care this afternoon.  ?

## 2021-05-26 NOTE — Addendum Note (Signed)
Addended by: Vicente Males on: 05/26/2021 09:51 AM ? ? Modules accepted: Orders ? ?

## 2021-05-26 NOTE — Telephone Encounter (Signed)
Referral faxed and pt is aware.  ?

## 2021-05-26 NOTE — Telephone Encounter (Signed)
Orders for fasting lipids, cmet, cbc entered. Patient advised to be fasting for the blood work. ?

## 2021-05-30 DIAGNOSIS — I10 Essential (primary) hypertension: Secondary | ICD-10-CM | POA: Diagnosis not present

## 2021-05-30 DIAGNOSIS — R002 Palpitations: Secondary | ICD-10-CM | POA: Diagnosis not present

## 2021-05-30 DIAGNOSIS — E782 Mixed hyperlipidemia: Secondary | ICD-10-CM | POA: Diagnosis not present

## 2021-05-31 ENCOUNTER — Encounter: Payer: Self-pay | Admitting: Nurse Practitioner

## 2021-05-31 ENCOUNTER — Ambulatory Visit: Payer: Medicare Other | Admitting: Nurse Practitioner

## 2021-05-31 VITALS — BP 148/98 | HR 77 | Ht 60.0 in | Wt 120.0 lb

## 2021-05-31 DIAGNOSIS — R1030 Lower abdominal pain, unspecified: Secondary | ICD-10-CM | POA: Diagnosis not present

## 2021-05-31 LAB — LIPID PANEL
Chol/HDL Ratio: 1.9 ratio (ref 0.0–4.4)
Cholesterol, Total: 198 mg/dL (ref 100–199)
HDL: 102 mg/dL (ref >39)
LDL Chol Calc (NIH): 84 mg/dL (ref 0–99)
Triglycerides: 63 mg/dL (ref 0–149)
VLDL Cholesterol Cal: 12 mg/dL (ref 5–40)

## 2021-05-31 LAB — COMPREHENSIVE METABOLIC PANEL
ALT: 17 IU/L (ref 0–32)
AST: 23 IU/L (ref 0–40)
Albumin/Globulin Ratio: 2 (ref 1.2–2.2)
Albumin: 4.9 g/dL — ABNORMAL HIGH (ref 3.6–4.6)
Alkaline Phosphatase: 84 IU/L (ref 44–121)
BUN/Creatinine Ratio: 14 (ref 12–28)
BUN: 10 mg/dL (ref 8–27)
Bilirubin Total: 0.5 mg/dL (ref 0.0–1.2)
CO2: 23 mmol/L (ref 20–29)
Calcium: 10.2 mg/dL (ref 8.7–10.3)
Chloride: 100 mmol/L (ref 96–106)
Creatinine, Ser: 0.69 mg/dL (ref 0.57–1.00)
Globulin, Total: 2.5 g/dL (ref 1.5–4.5)
Glucose: 92 mg/dL (ref 70–99)
Potassium: 4.6 mmol/L (ref 3.5–5.2)
Sodium: 141 mmol/L (ref 134–144)
Total Protein: 7.4 g/dL (ref 6.0–8.5)
eGFR: 87 mL/min/{1.73_m2} (ref >59)

## 2021-05-31 LAB — CBC
Hematocrit: 42.1 % (ref 34.0–46.6)
Hemoglobin: 14.8 g/dL (ref 11.1–15.9)
MCH: 33 pg (ref 26.6–33.0)
MCHC: 35.2 g/dL (ref 31.5–35.7)
MCV: 94 fL (ref 79–97)
Platelets: 273 10*3/uL (ref 150–450)
RBC: 4.48 x10E6/uL (ref 3.77–5.28)
RDW: 11.4 % — ABNORMAL LOW (ref 11.7–15.4)
WBC: 4.9 10*3/uL (ref 3.4–10.8)

## 2021-05-31 MED ORDER — OMEPRAZOLE 20 MG PO CPDR: 20.0000 mg | DELAYED_RELEASE_CAPSULE | Freq: Two times a day (BID) | ORAL | 2 refills | Status: DC

## 2021-05-31 NOTE — Progress Notes (Signed)
? ? ? ?05/31/2021 ?[name] ?[id number] ?[birth year] ? ? ?Chief Complaint: Abdominal pain, throat tightness  ? ?History of Present Illness: [name] is an 81 year old female with a past medical history of osteopenia, hypertension, hyperlipidemia, multinodular goiter, osteopenia and GERD.  ? ?She presents to our office today for further evaluation regarding lower abdominal pain which started 1 week ago she was different from her prior IBS symptoms.  She described having lower abdominal pain which was somewhat worse when she sat on a hard surface, felt as if the pain radiated from the rectum to the lower abdomen at times.  No associated constipation.  She is passing normal formed stool daily, her stool was darker brown and she passed a bit of mucus per the rectum on the days she had lower abdominal pain.  No rectal bleeding.  No melena.  Her stool color returned back to a normal brown a few days ago.  No recent new medications.  No diet changes.  Her most recent colonoscopy was 11/26/2012 which showed diverticulosis to the sigmoid colon, a diminutive polyp was removed from the sigmoid colon and internal hemorrhoids.  Path report showed benign polypoid colorectal mucosa with lymphoid aggregate.  ? ?She is also concerned regarding her persistent throat tightness which occurs when swallowing saliva, water or food.  She denies feeling a choking sensation but just feels tightness in her throat.  She is also experiencing voice hoarseness and increased burping.  No odynophagia or dysphagia.  No specific food or stress triggers.  She does not feel anxious at this time.  No weight loss.  He drinks occasional glass of wine.  She takes ASA 81 mg daily.  No other NSAIDs. ? ?She was previously seen in our office by Nicoletta Ba PA-C 02/27/2021 due episodes of choking on water. 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  She was prescribed Levsin  for esophageal spasms, however, the patient did not initiate this treatment due to concerns for potential adverse side effects. ? ? ?  Latest Ref Rng & Units 05/30/2021  ?  8:25 AM 02/03/2021  ? 12:32 PM 06/22/2020  ?  9:45 AM  ?CBC  ?WBC 3.4 - 10.8 x10E3/uL 4.9   5.8   5.0    ?Hemoglobin 11.1 - 15.9 g/dL 14.8   14.2   13.7    ?Hematocrit 34.0 - 46.6 % 42.1   42.0   39.4    ?Platelets 150 - 450 x10E3/uL 273   205   234    ?  ? ?  Latest Ref Rng & Units 05/30/2021  ?  8:25 AM 02/03/2021  ? 12:32 PM 06/22/2020  ?  9:45 AM  ?CMP  ?Glucose 70 - 99 mg/dL 92   100   88    ?BUN 8 - 27 mg/dL '10   14   11    '$ ?Creatinine 0.57 - 1.00 mg/dL 0.69   0.60   0.68    ?Sodium 134 - 144 mmol/L 141   134   130    ?Potassium 3.5 - 5.2 mmol/L 4.6   4.2   4.3    ?Chloride 96 - 106 mmol/L 100   99   90    ?CO2 20 - 29 mmol/L '23   26   23    '$ ?Calcium 8.7 - 10.3 mg/dL 10.2   9.4   10.0    ?Total Protein 6.0 - 8.5 g/dL 7.4  7.0    ?Total Bilirubin 0.0 - 1.2 mg/dL 0.5    0.8    ?Alkaline Phos 44 - 121 IU/L 84    55    ?AST 0 - 40 IU/L 23    27    ?ALT 0 - 32 IU/L 17    38    ?  ?ECHO 04/29/2020: ?1. Left ventricular ejection fraction, by estimation, is 70 to 75%. The left ventricle has ?hyperdynamic function. The left ventricle has no regional wall motion abnormalities. Left ?ventricular diastolic parameters were normal. ?2. Right ventricular systolic function is normal. The right ventricular size is normal. There is ?normal pulmonary artery systolic pressure. ?3. The mitral valve is normal in structure. Mild mitral valve regurgitation. ?4. The aortic valve is tricuspid. Aortic valve regurgitation is not visualized. Mild aortic valve ?sclerosis is present, with no evidence of aortic valve stenosis. ?5. The inferior vena cava is normal in size with greater than 50% respiratory variability, ?suggesting right atrial pressure of 3 mmHg ? ?Barium swallow with tablet 03/06/2021: ?Nonspecific esophageal dysmotility with tertiary  contractions ?observed. ? ?Colonoscopy 11/26/2012 by Dr. Olevia Perches: ?Diminutive polyp in the sigmoid colon ?Diverticulosis to the sigmoid colon ?Small internal hemorrhoids  ?No recall colonoscopy due to age ?1. Surgical [P], random colon, bx ?- BENIGN COLORECTAL MUCOSA. ?- NO EVIDENCE OF INCREASED INFLAMMATION, DYSPLASIA OR MALIGNANCY. ?2. Surgical [P], colon polyp at 15cm, bx ?- BENIGN POLYPOID COLORECTAL MUCOSA WITH ASSOCIATED BENIGN LYMPHOID ?AGGREGATE. ?- NO DYSPLASIA OR MALIGNANCY IDENTIFIED. ? ?Current Outpatient Medications on File Prior to Visit  ?Medication Sig Dispense Refill  ? ALPRAZolam (XANAX) 0.25 MG tablet TAKE 1/2 TO 1 TABLET DAILY AS NEEDED FOR ANXIETY. 20 tablet 0  ? aspirin EC 81 MG tablet Take 1 tablet (81 mg total) by mouth daily. Swallow whole. 90 tablet 3  ? Cholecalciferol (VITAMIN D) 50 MCG (2000 UT) tablet Take 1 tablet by mouth daily.    ? Flaxseed, Linseed, (FLAX PO) Take 1 Dose by mouth every morning.     ? losartan (COZAAR) 100 MG tablet Take 1 tablet (100 mg total) by mouth every morning. 90 tablet 3  ? metoprolol tartrate (LOPRESSOR) 50 MG tablet TAKE (1)TABLET TWICE DAILY. 180 tablet 3  ? Multiple Vitamins-Minerals (CENTRUM SILVER) tablet Take 1 tablet by mouth daily.    ? OMEGA 3 1000 MG CAPS Take 1,000 mg by mouth at bedtime.     ? OVER THE COUNTER MEDICATION Calcium '600mg'$  one daily ?Vitamin D 2,000 iu daily    ? Probiotic Product (PROBIOTIC DAILY PO) Take 1 tablet by mouth daily.    ? Psyllium (METAMUCIL PO) Take 1 Scoop by mouth every evening.     ? atorvastatin (LIPITOR) 40 MG tablet Take 1 tablet (40 mg total) by mouth daily. 90 tablet 3  ? ?No current facility-administered medications on file prior to visit.  ? ?Allergies  ?Allergen Reactions  ? Codeine Other (See Comments)  ?  REACTION: syncope  ? Penicillins Rash  ?  Can tolerate cephalosporins  ? ? ?Current Medications, Allergies, Past Medical History, Past Surgical History, Family History and Social History were reviewed in  [employer] record. ? ? ?Review of Systems:   ?Constitutional: Negative for fever, sweats, chills or weight loss.  ?Respiratory: Negative for shortness of breath.   ?Cardiovascular: Negative for chest pain, palpitations and leg swelling.  ?Gastrointestinal: See HPI.  ?Musculoskeletal: Negative for back pain or muscle aches.  ?Neurological: Negative for dizziness, headaches or paresthesias.  ? ? ?  Physical Exam: ?BP (!) 148/98   Pulse 77   Ht 5' (1.524 m)   Wt 120 lb (54.4 kg)   LMP  (LMP Unknown)   SpO2 97%   BMI 23.44 kg/m?  ?Wt Readings from Last 3 Encounters:  ?05/31/21 120 lb (54.4 kg)  ?02/27/21 118 lb 2 oz (53.6 kg)  ?02/07/21 117 lb 3.2 oz (53.2 kg)  ?  ?General: 81 year old female in no acute distress. ?Head: Normocephalic and atraumatic. ?Eyes: No scleral icterus. Conjunctiva pink . ?Ears: Normal auditory acuity. ?Mouth: Dentition intact. No ulcers or lesions.  ?Lungs: Clear throughout to auscultation. ?Heart: Regular rate and rhythm, no murmur. ?Abdomen: Soft, nondistended.  Slight tenderness to the RLQ without rebound or guarding.  No masses or hepatomegaly. Normal bowel sounds x 4 quadrants.  ?Rectal: Deferred. ?Musculoskeletal: Symmetrical with no gross deformities. ?Extremities: No edema. ?Neurological: Alert oriented x 4. No focal deficits.  ?Psychological: Alert and cooperative. Normal mood and affect ? ?Assessment and Recommendations: ? ?26) 81 year old female with a history of sigmoid diverticulosis with lower abdominal pain x 3 days one week ago which resolved.  WBC 4.9.  Hemoglobin 14.8 ?-I discussed scheduling CTAP with contrast if lower abdominal pain recurs ? ?2) Mild esophageal dysmotility with throat tightening, voice hoarseness and increased burping. Patient reported seeing an ENT for these symptoms, laryngoscopy was unrevealing. ?-Omeprazole 20 mg p.o. twice daily ?-Consider EGD if no improvement within the next 1 to 2 months ? ?3) Colon cancer screening.   Colonoscopy 11/26/2012 identified a diminutive polyp in the sigmoid colon, biopsy showed benign polypoid colorectal mucosa with benign lymphoid aggregate.  ? -No further screening colonoscopies recommended due to age ? ? ?

## 2021-05-31 NOTE — Patient Instructions (Signed)
?  1) Start Omeprazole '20mg'$  one tab to be taken 30 minutes before breakfast and dinner  ? ?2) Contact our office if your lower abdominal pain recurs  ?

## 2021-05-31 NOTE — Progress Notes (Signed)
Agree with assessment and plan as outlined.  

## 2021-06-01 NOTE — Progress Notes (Signed)
?Cardiology Clinic Note  ? ?Patient Name: [name] ?Date of Encounter: 06/02/2021 ? ?Primary Care Provider:  Coral Spikes, DO ?Primary Cardiologist:  Rozann Lesches, MD ? ?Patient Profile  ?  ?81 year old female with known history of HTN, right subclavian steel syndrome, carotid artery disease, lumbar stenosis, CKD, IBS, right thyroid nodule.  She is status post lumbar fusion while staying with family in [place].   ? ?Past Medical History  ?  ?Past Medical History:  ?Diagnosis Date  ? Arthritis   ? Basal cell carcinoma 04/29/2019  ? right inner cheek [facility])  ? Basal cell carcinoma 04/29/2019  ? left side nose (MOHs)  ? Chronic kidney disease   ? Diverticulosis of colon   ? Essential hypertension   ? History of basal cell carcinoma (BCC) excision   ? 02-10-2013  left cheek  s/p  moh's sx  ? History of palpitations   ? History of recurrent UTIs   ? History of syncope   ? 2015-- felt to vasovagal response to pain (per cardiologist note)  ? IBS (irritable bowel syndrome)   ? Open wound of left knee   ? warm soaks daily /  neosprin and dressing daily / per per still has drainage  ? Patellar bursitis of left knee   ? pre-patella septic bursitis w/ foreign body  ? Right thyroid nodule   ? x2 right side incidental finding on carotid duplex 03/ 2018-- thyroid ultrasound done , benign , follow-up in one year  ? 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/29/20201  ? in situ-right upper arm-anterior  ? Squamous cell carcinoma of skin 11/04/2019  ? KA-right lower leg-anterior superior  ? Wears hearing aid in both ears   ? ?Past Surgical History:  ?Procedure Laterality Date  ? APPENDECTOMY  age 10  ? BREAST BIOPSY  1962  ? benign  ? CATARACT EXTRACTION W/PHACO  06/25/2011  ? Procedure: CATARACT EXTRACTION PHACO AND INTRAOCULAR LENS PLACEMENT (IOC);  Surgeon: Williams Che, MD;  Location: [facility];  Service: Ophthalmology;  Laterality: Right;  CDE: 8.90  ? CATARACT EXTRACTION W/PHACO Left 05/05/2012  ? Procedure: CATARACT  EXTRACTION PHACO AND INTRAOCULAR LENS PLACEMENT (IOC);  Surgeon: Williams Che, MD;  Location: [facility];  Service: Ophthalmology;  Laterality: Left;  CDE 12.78  ? COLONOSCOPY  last one 11-26-2012  ? EXCISION MORTON'S NEUROMA Right 2003  approx.  ? INCISION AND DRAINAGE WOUND WITH FOREIGN BODY REMOVAL Left 08/30/2016  ? Procedure: LEFT KNEE INCISION AND DRAINAGE WOUND WITH FOREIGN BODY REMOVAL;  Surgeon: Sydnee Cabal, MD;  Location: [place];  Service: Orthopedics;  Laterality: Left;  ? KNEE ARTHROPLASTY Right 03/14/2017  ? Procedure: RIGHT TOTAL KNEE ARTHROPLASTY WITH COMPUTER NAVIGATION AND REMOVAL OF HARDWARE;  Surgeon: Rod Can, MD;  Location: [facility];  Service: Orthopedics;  Laterality: Right;  Adductor Block  ? KNEE ARTHROSCOPY W/ ACL RECONSTRUCTION Right 1996  ? KNEE CARTILAGE SURGERY Right age 2  ? LUMBAR FUSION  08/28/2020  ? MOHS SURGERY  02/10/2013  ? left cheek -- BCC  ? POSTERIOR REPAIR  05-21-2002   dr Glo Herring  ? symptomatic recetocele  ? TONSILLECTOMY AND ADENOIDECTOMY  age 38  ? TRANSTHORACIC ECHOCARDIOGRAM  04-17-2016  dr Domenic Polite  ? ef 60-65%/  trivial MR/ mild TR  ? VAGINAL HYSTERECTOMY  1979  ? w/  Bilateral Salpingoophorectomy  ? ? ?Allergies ? ?Allergies  ?Allergen Reactions  ? Codeine Other (See Comments)  ?  REACTION: syncope  ?  Penicillins Rash  ?  Can tolerate cephalosporins  ? ? ?History of Present Illness  ?  ?Mrs. Platt comes today for ongoing assessment and management of hypertension with history of right subclavian steel syndrome.  Was last seen in the office on 11/11/2020 with no changes in her medication regimen. She was to take HCTZ for BP >150/85 prn. Continue to take BP daily and record for trends.  Was seen by GI on 05/31/2021 for mild esophageal dysmotility, with increase in omeprazole to 20 mg BID. ? ?Since having spinal fusion surgery she Is back to her normal activities to include walking, hiking, yoga, and socializing.  She brings with her copies  of her blood pressures recording over the last 2 months which have been slightly elevated in January, and therefore losartan was increased to 100 mg daily by PCP.  Since increasing losartan blood pressure has been ranging in the 1 26-1 38 range depending upon her activity level. ? ?Home Medications  ?  ?Current Outpatient Medications  ?Medication Sig Dispense Refill  ? ALPRAZolam (XANAX) 0.25 MG tablet TAKE 1/2 TO 1 TABLET DAILY AS NEEDED FOR ANXIETY. 20 tablet 0  ? aspirin EC 81 MG tablet Take 1 tablet (81 mg total) by mouth daily. Swallow whole. 90 tablet 3  ? Cholecalciferol (VITAMIN D) 50 MCG (2000 UT) tablet Take 1 tablet by mouth daily.    ? Flaxseed, Linseed, (FLAX PO) Take 1 Dose by mouth every morning.     ? losartan (COZAAR) 100 MG tablet Take 1 tablet (100 mg total) by mouth every morning. 90 tablet 3  ? metoprolol tartrate (LOPRESSOR) 50 MG tablet TAKE (1)TABLET TWICE DAILY. 180 tablet 3  ? Multiple Vitamins-Minerals (CENTRUM SILVER) tablet Take 1 tablet by mouth daily.    ? OMEGA 3 1000 MG CAPS Take 1,000 mg by mouth at bedtime.     ? omeprazole (PRILOSEC) 20 MG capsule Take 1 capsule (20 mg total) by mouth 2 (two) times daily before a meal. Take one capsule by mouth twice daily to  be taken 30 minutes before breakfast and dinner. 90 capsule 2  ? OVER THE COUNTER MEDICATION Calcium '600mg'$  one daily ?Vitamin D 2,000 iu daily    ? Probiotic Product (PROBIOTIC DAILY PO) Take 1 tablet by mouth daily.    ? Psyllium (METAMUCIL PO) Take 1 Scoop by mouth every evening.     ? atorvastatin (LIPITOR) 40 MG tablet Take 1 tablet (40 mg total) by mouth daily. 90 tablet 3  ? ?No current facility-administered medications for this visit.  ?  ? ?Family History  ?  ?Family History  ?Problem Relation Age of Onset  ? Hypertension Mother   ? CVA Mother   ? Throat cancer Father   ? Pseudochol deficiency Neg Hx   ? Malignant hyperthermia Neg Hx   ? Hypotension Neg Hx   ? Anesthesia problems Neg Hx   ? Colon cancer Neg Hx   ?  Esophageal cancer Neg Hx   ? ?She indicated that the status of her mother is unknown. She indicated that the status of her father is unknown. She indicated that the status of her neg hx is unknown. ? ?Social History  ?  ?Social History  ? ?Socioeconomic History  ? Marital status: Married  ?  Spouse name: Not on file  ? Number of children: 2  ? Years of education: Not on file  ? Highest education level: Not on file  ?Occupational History  ? Occupation: retired  ?  Tobacco Use  ? Smoking status: Former  ?  Packs/day: 0.50  ?  Years: 10.00  ?  Pack years: 5.00  ?  Types: Cigarettes  ?  Quit date: 04/30/1962  ?  Years since quitting: 59.1  ? Smokeless tobacco: Never  ?Vaping Use  ? Vaping Use: Never used  ?Substance and Sexual Activity  ? Alcohol use: Yes  ?  Alcohol/week: 14.0 standard drinks  ?  Types: 14 Glasses of wine per week  ?  Comment: 2 wine daily  ? Drug use: No  ? Sexual activity: Yes  ?  Birth control/protection: Surgical  ?Other Topics Concern  ? Not on file  ?Social History Narrative  ? Not on file  ? ?Social Determinants of Health  ? ?Financial Resource Strain: Not on file  ?Food Insecurity: Not on file  ?Transportation Needs: Not on file  ?Physical Activity: Not on file  ?Stress: Not on file  ?Social Connections: Not on file  ?Intimate Partner Violence: Not on file  ?  ? ?Review of Systems  ?  ?General:  No chills, fever, night sweats or weight changes.  ?Cardiovascular:  No chest pain, dyspnea on exertion, edema, orthopnea, palpitations, paroxysmal nocturnal dyspnea. ?Dermatological: No rash, lesions/masses ?Respiratory: No cough, dyspnea ?Urologic: No hematuria, dysuria ?Abdominal:   No nausea, vomiting, diarrhea, bright red blood per rectum, melena, or hematemesis ?Neurologic:  No visual changes, wkns, changes in mental status. ?All other systems reviewed and are otherwise negative except as noted above. ? ?Physical Exam  ?  ?VS:  BP (!) 160/80 (BP Location: Left Arm)   Pulse (!) 59   Ht 5' (1.524 m)    Wt 121 lb 3.2 oz (55 kg)   LMP  (LMP Unknown)   SpO2 96%   BMI 23.67 kg/m?  , BMI Body mass index is 23.67 kg/m?. ?    ?GEN: Well nourished, well developed, in no acute distress. ?HEENT: normal. ?Neck: Su

## 2021-06-02 ENCOUNTER — Encounter: Payer: Self-pay | Admitting: Adult Health

## 2021-06-02 ENCOUNTER — Ambulatory Visit (INDEPENDENT_AMBULATORY_CARE_PROVIDER_SITE_OTHER): Payer: Medicare Other | Admitting: Adult Health

## 2021-06-02 VITALS — BP 160/80 | HR 59 | Ht 60.0 in | Wt 121.2 lb

## 2021-06-02 DIAGNOSIS — G458 Other transient cerebral ischemic attacks and related syndromes: Secondary | ICD-10-CM

## 2021-06-02 DIAGNOSIS — I10 Essential (primary) hypertension: Secondary | ICD-10-CM

## 2021-06-02 NOTE — Patient Instructions (Signed)
Medication Instructions:  ?Your Physician recommend you continue on your current medication as directed.   ? ?*If you need a refill on your cardiac medications before your next appointment, please call your pharmacy* ? ?Follow-Up: ?At [facility], you and your health needs are our priority.  As part of our continuing mission to provide you with exceptional heart care, we have created designated Provider Care Teams.  These Care Teams include your primary Cardiologist (physician) and Advanced Practice Providers (APPs -  Physician Assistants and Nurse Practitioners) who all work together to provide you with the care you need, when you need it. ? ?We recommend signing up for the patient portal called "MyChart".  Sign up information is provided on this After Visit Summary.  MyChart is used to connect with patients for Virtual Visits (Telemedicine).  Patients are able to view lab/test results, encounter notes, upcoming appointments, etc.  Non-urgent messages can be sent to your provider as well.   ?To learn more about what you can do with MyChart, go to [email or web address].   ? ?Your next appointment:   ?1 year ? ?The format for your next appointment:   ?In Person ? ?Provider:   ?[name], DNP, ANP     ? ? ? ?

## 2021-06-06 ENCOUNTER — Telehealth: Payer: Self-pay

## 2021-06-06 NOTE — Telephone Encounter (Addendum)
Review with Arnold Long at appointment on 4/21.----- Message from Lendon Colonel, NP sent at 06/03/2021  8:30 AM EDT ----- ?Reviewed and discussed with patient during appointment.  ? ?KL ?

## 2021-06-15 DIAGNOSIS — Z981 Arthrodesis status: Secondary | ICD-10-CM | POA: Diagnosis not present

## 2021-06-15 DIAGNOSIS — M4310 Spondylolisthesis, site unspecified: Secondary | ICD-10-CM | POA: Diagnosis not present

## 2021-06-20 ENCOUNTER — Other Ambulatory Visit: Payer: Self-pay | Admitting: Family Medicine

## 2021-06-23 ENCOUNTER — Other Ambulatory Visit: Payer: Self-pay | Admitting: Nurse Practitioner

## 2021-06-23 ENCOUNTER — Telehealth: Payer: Self-pay | Admitting: Nurse Practitioner

## 2021-06-23 MED ORDER — CEFPROZIL 500 MG PO TABS: ORAL_TABLET | ORAL | 0 refills | Status: DC

## 2021-06-23 NOTE — Telephone Encounter (Signed)
Pt requesting refil lon Cefrpozil. Pt states she gets UTI easy after intercourse. Pt not having any symptoms of UTI at this time. Please adivse. Thank you.  laynes

## 2021-07-08 ENCOUNTER — Ambulatory Visit
Admission: EM | Admit: 2021-07-08 | Discharge: 2021-07-08 | Disposition: A | Discharge status: Home / Self Care | Payer: Medicare Other | Attending: Family Medicine | Admitting: Family Medicine

## 2021-07-08 DIAGNOSIS — R030 Elevated blood-pressure reading, without diagnosis of hypertension: Secondary | ICD-10-CM | POA: Insufficient documentation

## 2021-07-08 DIAGNOSIS — N390 Urinary tract infection, site not specified: Secondary | ICD-10-CM | POA: Insufficient documentation

## 2021-07-08 LAB — POCT URINALYSIS DIP (MANUAL ENTRY)
Bilirubin, UA: NEGATIVE
Glucose, UA: NEGATIVE mg/dL
Ketones, POC UA: NEGATIVE mg/dL
Nitrite, UA: NEGATIVE
Protein Ur, POC: NEGATIVE mg/dL
Spec Grav, UA: 1.015 (ref 1.010–1.025)
Urobilinogen, UA: 0.2 E.U./dL
pH, UA: 7.5 (ref 5.0–8.0)

## 2021-07-08 MED ORDER — CEPHALEXIN 500 MG PO CAPS: 500.0000 mg | ORAL_CAPSULE | Freq: Two times a day (BID) | ORAL | 0 refills | Status: DC

## 2021-07-08 NOTE — ED Triage Notes (Signed)
Pt states she has a Hx of UTI for 62 years  Pt states she had a UTI for about 3 weeks ago and she was diagnosed with Cefprozil and now she is having symptoms again  Pt states she has pressure on her bladder, frequency and burning when urinating  Denies Fever

## 2021-07-08 NOTE — ED Provider Notes (Signed)
[facility]    CSN: [id number] Arrival date & time: 07/08/21  1140      History   Chief Complaint Chief Complaint  Patient presents with   UTI symptoms    HPI [name] is a 81 y.o. female.   Presenting today with new onset urinary frequency, suprapubic pressure, dysuria that started this morning.  She states she has a history of recurrent urinary tract infections and they always start this way.  She was last treated 3 weeks ago with antibiotics with full clearance of symptoms up until this morning.  She states she is leaving for a trip to [place] in the morning and wanted to address this right away.  Denies fever, chills, abdominal pain, nausea vomiting or diarrhea.   Past Medical History:  Diagnosis Date   Arthritis    Basal cell carcinoma 04/29/2019   right inner cheek [facility])   Basal cell carcinoma 04/29/2019   left side nose [facility])   Chronic kidney disease    Diverticulosis of colon    Essential hypertension    History of basal cell carcinoma (BCC) excision    02-10-2013  left cheek  s/p  moh's sx   History of palpitations    History of recurrent UTIs    History of syncope    2015-- felt to vasovagal response to pain (per cardiologist note)   IBS (irritable bowel syndrome)    Open wound of left knee    warm soaks daily /  neosprin and dressing daily / per per still has drainage   Patellar bursitis of left knee    pre-patella septic bursitis w/ foreign body   Right thyroid nodule    x2 right side incidental finding on carotid duplex 03/ 2018-- thyroid ultrasound done , benign , follow-up in one year   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/29/20201   in situ-right upper arm-anterior   Squamous cell carcinoma of skin 11/04/2019   KA-right lower leg-anterior superior   Wears hearing aid in both ears     Patient Active Problem List   Diagnosis Date Noted   Pharyngoesophageal dysphagia 02/08/2021   Mixed hyperlipidemia 06/25/2020   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   Hyponatremia 06/25/2020   Essential hypertension 03/25/2020   S/P knee surgery 08/30/2016   History of recurrent UTIs 09/19/2015   Osteopenia 05/21/2015   Esophageal reflux 02/15/2014    Past Surgical History:  Procedure Laterality Date   APPENDECTOMY  age 30   BREAST BIOPSY  1962   benign   CATARACT EXTRACTION W/PHACO  06/25/2011   Procedure: CATARACT EXTRACTION PHACO AND INTRAOCULAR LENS PLACEMENT ([place]);  Surgeon: Williams Che, MD;  [facility];  Service: Ophthalmology;  Laterality: Right;  CDE: 8.90   CATARACT EXTRACTION W/PHACO Left 05/05/2012   Procedure: CATARACT EXTRACTION PHACO AND INTRAOCULAR LENS PLACEMENT ([facility]);  Surgeon: Williams Che, MD;  Location: [facility];  Service: Ophthalmology;  Laterality: Left;  CDE 12.78   COLONOSCOPY  last one 11-26-2012   EXCISION MORTON'S NEUROMA Right 2003  approx.   INCISION AND DRAINAGE WOUND WITH FOREIGN BODY REMOVAL Left 08/30/2016   Procedure: LEFT KNEE INCISION AND DRAINAGE WOUND WITH FOREIGN BODY REMOVAL;  Surgeon: Sydnee Cabal, MD;  Location: [place];  Service: Orthopedics;  Laterality: Left;   KNEE ARTHROPLASTY Right 03/14/2017   Procedure: RIGHT TOTAL KNEE ARTHROPLASTY WITH COMPUTER NAVIGATION AND REMOVAL OF HARDWARE;  Surgeon: Rod Can, MD;  Location: [facility];  Service: Orthopedics;  Laterality: Right;  Adductor Block   KNEE ARTHROSCOPY W/ ACL RECONSTRUCTION Right 1996   KNEE CARTILAGE SURGERY Right age 57   LUMBAR FUSION  08/28/2020   MOHS SURGERY  02/10/2013   left cheek -- [facility]   POSTERIOR REPAIR  05-21-2002   dr Glo Herring   symptomatic recetocele   TONSILLECTOMY AND ADENOIDECTOMY  age 74   TRANSTHORACIC ECHOCARDIOGRAM  04-17-2016  dr Domenic Polite   ef 60-65%/  trivial MR/ mild TR   VAGINAL HYSTERECTOMY  1979   w/  Bilateral Salpingoophorectomy    OB History   No obstetric history on file.      Home Medications    Prior to Admission medications    Medication Sig Start Date End Date Taking? Authorizing Provider  cephALEXin (KEFLEX) 500 MG capsule Take 1 capsule (500 mg total) by mouth 2 (two) times daily. 07/08/21  Yes Volney American, PA-C  losartan (COZAAR) 100 MG tablet Take 1 tablet (100 mg total) by mouth every morning. 02/07/21  Yes Cook, Jayce G, DO  metoprolol tartrate (LOPRESSOR) 50 MG tablet TAKE (1)TABLET TWICE DAILY. 01/05/21  Yes Cook, Jayce G, DO  ALPRAZolam (XANAX) 0.25 MG tablet TAKE 1/2 TO 1 TABLET DAILY AS NEEDED FOR ANXIETY. 03/03/21   Coral Spikes, DO  aspirin EC 81 MG tablet Take 1 tablet (81 mg total) by mouth daily. Swallow whole. 07/15/20   Lendon Colonel, NP  atorvastatin (LIPITOR) 40 MG tablet Take 1 tablet (40 mg total) by mouth daily. 01/05/21 04/05/21  Coral Spikes, DO  cefPROZIL (CEFZIL) 500 MG tablet Take one po after intercourse prn 06/23/21   Nilda Simmer, NP  Cholecalciferol (VITAMIN D) 50 MCG (2000 UT) tablet Take 1 tablet by mouth daily. 07/27/20   [provider]  Flaxseed, Linseed, (FLAX PO) Take 1 Dose by mouth every morning.     [provider]  Multiple Vitamins-Minerals (CENTRUM SILVER) tablet Take 1 tablet by mouth daily.    [provider]  OMEGA 3 1000 MG CAPS Take 1,000 mg by mouth at bedtime.     [provider]  omeprazole (PRILOSEC) 20 MG capsule Take 1 capsule (20 mg total) by mouth 2 (two) times daily before a meal. Take one capsule by mouth twice daily to  be taken 30 minutes before breakfast and dinner. 05/31/21   Noralyn Pick, NP  OVER THE COUNTER MEDICATION Calcium '600mg'$  one daily Vitamin D 2,000 iu daily    [provider]  Probiotic Product (PROBIOTIC DAILY PO) Take 1 tablet by mouth daily.    [provider]  Psyllium (METAMUCIL PO) Take 1 Scoop by mouth every evening.     [provider]    Family History Family History  Problem Relation Age of Onset   Hypertension Mother    CVA Mother    Throat  cancer Father    Pseudochol deficiency Neg Hx    Malignant hyperthermia Neg Hx    Hypotension Neg Hx    Anesthesia problems Neg Hx    Colon cancer Neg Hx    Esophageal cancer Neg Hx     Social History Social History   Tobacco Use   Smoking status: Former    Packs/day: 0.50    Years: 10.00    Pack years: 5.00    Types: Cigarettes    Quit date: 04/30/1962    Years since quitting: 59.2   Smokeless tobacco: Never  Vaping Use   Vaping Use: Never used  Substance  Use Topics   Alcohol use: Yes    Alcohol/week: 14.0 standard drinks    Types: 14 Glasses of wine per week    Comment: 2 wine daily   Drug use: No     Allergies   Codeine and Penicillins   Review of Systems Review of Systems Per HPI  Physical Exam Triage Vital Signs ED Triage Vitals  Enc Vitals Group     BP 07/08/21 1231 (!) 210/107     Pulse Rate 07/08/21 1231 70     Resp 07/08/21 1231 16     Temp 07/08/21 1231 (!) 97.3 F (36.3 C)     Temp Source 07/08/21 1231 Oral     SpO2 07/08/21 1231 99 %     Weight --      Height --      Head Circumference --      Peak Flow --      Pain Score 07/08/21 1226 4     Pain Loc --      Pain Edu? --      Excl. in [place]? --    No data found.  Updated Vital Signs BP (!) 214/118 (BP Location: Right Arm)   Pulse 70   Temp (!) 97.3 F (36.3 C) (Oral)   Resp 16   LMP  (LMP Unknown)   SpO2 99%   Visual Acuity Right Eye Distance:   Left Eye Distance:   Bilateral Distance:    Right Eye Near:   Left Eye Near:    Bilateral Near:     Physical Exam Vitals and nursing note reviewed.  Constitutional:      Appearance: Normal appearance. She is not ill-appearing.  HENT:     Head: Atraumatic.     Mouth/Throat:     Mouth: Mucous membranes are moist.     Pharynx: Oropharynx is clear.  Eyes:     Extraocular Movements: Extraocular movements intact.     Conjunctiva/sclera: Conjunctivae normal.  Cardiovascular:     Rate and Rhythm: Normal rate and regular rhythm.      Heart sounds: Normal heart sounds.  Pulmonary:     Effort: Pulmonary effort is normal.     Breath sounds: Normal breath sounds.  Musculoskeletal:        General: Normal range of motion.     Cervical back: Normal range of motion and neck supple.  Skin:    General: Skin is warm and dry.  Neurological:     Mental Status: She is alert and oriented to person, place, and time.     Motor: No weakness.     Gait: Gait normal.  Psychiatric:        Mood and Affect: Mood normal.        Thought Content: Thought content normal.        Judgment: Judgment normal.     UC Treatments / Results  Labs (all labs ordered are listed, but only abnormal results are displayed) Labs Reviewed  POCT URINALYSIS DIP (MANUAL ENTRY) - Abnormal; Notable for the following components:      Result Value   Blood, UA trace-lysed (*)    Leukocytes, UA Small (1+) (*)    All other components within normal limits  URINE CULTURE    EKG   Radiology No results found.  Procedures Procedures (including critical care time)  Medications Ordered in UC Medications - No data to display  Initial Impression / Assessment and Plan / UC Course  I have reviewed the triage  vital signs and the nursing notes.  Pertinent labs & imaging results that were available during my care of the patient were reviewed by me and considered in my medical decision making (see chart for details).     Urinalysis with evidence of early urinary tract infection, urine culture pending, start antibiotics while awaiting these results and adjust if needed.  Also discussed her significantly elevated blood pressure reading today.  She took her blood pressure medications faithfully this morning and states that she has whitecoat hypertension.  She denies any chest pain, headaches, dizziness, shortness of breath and plans to closely monitor home blood pressure readings.  Follow-up with cardiologist or go to emergency department if worsening.  Final  Clinical Impressions(s) / UC Diagnoses   Final diagnoses:  Acute lower UTI  Elevated blood pressure reading   Discharge Instructions   None    ED Prescriptions     Medication Sig Dispense Auth. Provider   cephALEXin (KEFLEX) 500 MG capsule Take 1 capsule (500 mg total) by mouth 2 (two) times daily. 10 capsule Volney American, [place]      PDMP not reviewed this encounter.   Volney American, [place] 07/08/21 1303

## 2021-07-09 LAB — URINE CULTURE: Culture: NO GROWTH

## 2021-07-27 DIAGNOSIS — H04123 Dry eye syndrome of bilateral lacrimal glands: Secondary | ICD-10-CM | POA: Diagnosis not present

## 2021-07-27 DIAGNOSIS — H40023 Open angle with borderline findings, high risk, bilateral: Secondary | ICD-10-CM | POA: Diagnosis not present

## 2021-07-27 DIAGNOSIS — H43812 Vitreous degeneration, left eye: Secondary | ICD-10-CM | POA: Diagnosis not present

## 2021-07-27 DIAGNOSIS — H0288B Meibomian gland dysfunction left eye, upper and lower eyelids: Secondary | ICD-10-CM | POA: Diagnosis not present

## 2021-07-27 DIAGNOSIS — H0288A Meibomian gland dysfunction right eye, upper and lower eyelids: Secondary | ICD-10-CM | POA: Diagnosis not present

## 2021-07-27 DIAGNOSIS — Z961 Presence of intraocular lens: Secondary | ICD-10-CM | POA: Diagnosis not present

## 2021-08-17 DIAGNOSIS — Z23 Encounter for immunization: Secondary | ICD-10-CM | POA: Diagnosis not present

## 2021-09-27 ENCOUNTER — Ambulatory Visit (INDEPENDENT_AMBULATORY_CARE_PROVIDER_SITE_OTHER): Payer: Medicare Other | Admitting: Family Medicine

## 2021-09-27 ENCOUNTER — Encounter: Payer: Self-pay | Admitting: Family Medicine

## 2021-09-27 DIAGNOSIS — K219 Gastro-esophageal reflux disease without esophagitis: Secondary | ICD-10-CM | POA: Diagnosis not present

## 2021-09-27 DIAGNOSIS — I10 Essential (primary) hypertension: Secondary | ICD-10-CM

## 2021-09-27 DIAGNOSIS — E782 Mixed hyperlipidemia: Secondary | ICD-10-CM | POA: Diagnosis not present

## 2021-09-27 MED ORDER — FAMOTIDINE 20 MG PO TABS: 20.0000 mg | ORAL_TABLET | Freq: Two times a day (BID) | ORAL | 6 refills | Status: DC

## 2021-09-27 NOTE — Progress Notes (Unsigned)
Subjective:  Patient ID: [name], female    DOB: 05/10/1940  Age: 81 y.o. MRN: [id number]  CC: Chief Complaint  Patient presents with   Hypertension    Esophageal spasms have improved;IBS; frequent belching coming from abdomen even if not eating. GI recommend Prilosec but pt states Prilosec interacts with Losartan.     HPI:  Patient Active Problem List   Diagnosis Date Noted   Pharyngoesophageal dysphagia 02/08/2021   Mixed hyperlipidemia 06/25/2020   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   Essential hypertension 03/25/2020   S/P knee surgery 08/30/2016   History of recurrent UTIs 09/19/2015   Osteopenia 05/21/2015   Esophageal reflux 02/15/2014    Social Hx   Social History   Socioeconomic History   Marital status: Married    Spouse name: Not on file   Number of children: 2   Years of education: Not on file   Highest education level: Not on file  Occupational History   Occupation: retired  Tobacco Use   Smoking status: Former    Packs/day: 0.50    Years: 10.00    Total pack years: 5.00    Types: Cigarettes    Quit date: 04/30/1962    Years since quitting: 59.4   Smokeless tobacco: Never  Vaping Use   Vaping Use: Never used  Substance and Sexual Activity   Alcohol use: Yes    Alcohol/week: 14.0 standard drinks of alcohol    Types: 14 Glasses of wine per week    Comment: 2 wine daily   Drug use: No   Sexual activity: Yes    Birth control/protection: Surgical  Other Topics Concern   Not on file  Social History Narrative   Not on file   Social Determinants of Health   Financial Resource Strain: Not on file  Food Insecurity: Not on file  Transportation Needs: Not on file  Physical Activity: Not on file  Stress: Not on file  Social Connections: Not on file    Review of Systems Per HPI  Objective:  BP 132/74   Pulse 82   Temp 97.9 F (36.6 C)   Wt 114 lb 12.8 oz (52.1 kg)   LMP  (LMP Unknown)   SpO2 90%   BMI 22.42 kg/m       09/27/2021   10:21 AM 07/08/2021   12:54 PM 07/08/2021   12:31 PM  BP/Weight  Systolic BP 993 716 967  Diastolic BP 74 893 810  Wt. (Lbs) 114.8    BMI 22.42 kg/m2      Physical Exam  Lab Results  Component Value Date   WBC 4.9 05/30/2021   HGB 14.8 05/30/2021   HCT 42.1 05/30/2021   PLT 273 05/30/2021   GLUCOSE 92 05/30/2021   CHOL 198 05/30/2021   TRIG 63 05/30/2021   HDL 102 05/30/2021   LDLCALC 84 05/30/2021   ALT 17 05/30/2021   AST 23 05/30/2021   NA 141 05/30/2021   K 4.6 05/30/2021   CL 100 05/30/2021   CREATININE 0.69 05/30/2021   BUN 10 05/30/2021   CO2 23 05/30/2021   TSH 2.270 06/03/2020   INR 0.97 02/26/2017     Assessment & Plan:   Problem List Items Addressed This Visit   None   Meds ordered this encounter  Medications   famotidine (PEPCID) 20 MG tablet    Sig: Take 1 tablet (20 mg total) by mouth 2 (two) times daily.    Dispense:  60 tablet  Refill:  6    Follow-up:  Return in about 1 year (around 09/28/2022).  [place]

## 2021-09-27 NOTE — Patient Instructions (Signed)
Pepcid as prescribed.  Follow up annually.  Take care  Dr. Lacinda Axon

## 2021-09-28 DIAGNOSIS — L821 Other seborrheic keratosis: Secondary | ICD-10-CM | POA: Diagnosis not present

## 2021-09-28 DIAGNOSIS — K219 Gastro-esophageal reflux disease without esophagitis: Secondary | ICD-10-CM | POA: Insufficient documentation

## 2021-09-28 DIAGNOSIS — L570 Actinic keratosis: Secondary | ICD-10-CM | POA: Diagnosis not present

## 2021-09-28 NOTE — Assessment & Plan Note (Addendum)
Hyperlipidemia well-controlled.  Most recent lipid panel revealed an LDL of 84.  Continue Lipitor.

## 2021-09-28 NOTE — Assessment & Plan Note (Signed)
Well-controlled -Continue losartan 

## 2021-09-28 NOTE — Assessment & Plan Note (Signed)
Pepcid 20 mg twice daily 

## 2021-10-03 DIAGNOSIS — S81811A Laceration without foreign body, right lower leg, initial encounter: Secondary | ICD-10-CM | POA: Diagnosis not present

## 2021-10-03 DIAGNOSIS — Y92410 Unspecified street and highway as the place of occurrence of the external cause: Secondary | ICD-10-CM | POA: Diagnosis not present

## 2021-10-03 DIAGNOSIS — S81812A Laceration without foreign body, left lower leg, initial encounter: Secondary | ICD-10-CM | POA: Diagnosis not present

## 2021-10-03 DIAGNOSIS — Y9355 Activity, bike riding: Secondary | ICD-10-CM | POA: Diagnosis not present

## 2021-10-04 ENCOUNTER — Encounter: Payer: Self-pay | Admitting: Emergency Medicine

## 2021-10-04 ENCOUNTER — Ambulatory Visit
Admission: EM | Admit: 2021-10-04 | Discharge: 2021-10-04 | Disposition: A | Discharge status: Home / Self Care | Payer: Medicare Other | Attending: Family Medicine | Admitting: Family Medicine

## 2021-10-04 DIAGNOSIS — S81811D Laceration without foreign body, right lower leg, subsequent encounter: Secondary | ICD-10-CM | POA: Diagnosis not present

## 2021-10-04 MED ORDER — CHLORHEXIDINE GLUCONATE 4 % EX LIQD: Freq: Every day | CUTANEOUS | 0 refills | Status: DC | PRN

## 2021-10-04 MED ORDER — MUPIROCIN 2 % EX OINT: 1.0000 | TOPICAL_OINTMENT | Freq: Two times a day (BID) | CUTANEOUS | 0 refills | Status: DC
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
## 2021-10-04 NOTE — Discharge Instructions (Signed)
Take the full course of Keflex, use the Hibiclens and mupirocin ointment along with nonstick gauze and Coban wrap daily to dress the wound.  Follow-up with your primary care provider within the next week, feel free to come back here if needed for a recheck

## 2021-10-06 DIAGNOSIS — S80819A Abrasion, unspecified lower leg, initial encounter: Secondary | ICD-10-CM | POA: Diagnosis not present

## 2021-10-11 ENCOUNTER — Ambulatory Visit: Payer: Medicare Other | Admitting: Family Medicine

## 2021-10-12 DIAGNOSIS — Z4802 Encounter for removal of sutures: Secondary | ICD-10-CM | POA: Diagnosis not present

## 2021-10-26 ENCOUNTER — Other Ambulatory Visit: Payer: Self-pay | Admitting: Family Medicine

## 2021-11-13 ENCOUNTER — Ambulatory Visit (HOSPITAL_COMMUNITY)
Admission: RE | Admit: 2021-11-13 | Discharge: 2021-11-13 | Disposition: A | Discharge status: Home / Self Care | Payer: Medicare Other | Source: Ambulatory Visit | Attending: Nurse Practitioner | Admitting: Nurse Practitioner

## 2021-11-13 ENCOUNTER — Ambulatory Visit (INDEPENDENT_AMBULATORY_CARE_PROVIDER_SITE_OTHER): Payer: Medicare Other | Admitting: Nurse Practitioner

## 2021-11-13 ENCOUNTER — Other Ambulatory Visit: Payer: Self-pay | Admitting: Nurse Practitioner

## 2021-11-13 VITALS — BP 163/92 | Temp 98.9°F | Ht 60.0 in | Wt 114.0 lb

## 2021-11-13 DIAGNOSIS — J189 Pneumonia, unspecified organism: Secondary | ICD-10-CM

## 2021-11-13 DIAGNOSIS — R059 Cough, unspecified: Secondary | ICD-10-CM | POA: Diagnosis not present

## 2021-11-13 DIAGNOSIS — R058 Other specified cough: Secondary | ICD-10-CM

## 2021-11-13 MED ORDER — DOXYCYCLINE HYCLATE 100 MG PO TABS: 100.0000 mg | ORAL_TABLET | Freq: Two times a day (BID) | ORAL | 0 refills | Status: AC

## 2021-11-13 NOTE — Progress Notes (Signed)
   Subjective:    Patient ID: [name], female    DOB: [birth year], 81 y.o.   MRN: [id number]  HPI 81 year old female patient presents to clinic today with complaints of sore throat, productive cough with yellow to green sputum, intermittent fever, intermittent shortness of breath, chills x7 days.  Patient states she has been using OTC cough and cold meds with some relief.  Patient states she is also been using her Nettie pot with intermittent relief.  Patient states she is tested negative for COVID 3 times.    Review of Systems  Constitutional:  Positive for chills and fever.  Respiratory:  Positive for cough and shortness of breath.        Objective:   Physical Exam Vitals reviewed.  Constitutional:      General: She is not in acute distress.    Appearance: Normal appearance. She is normal weight. She is not ill-appearing, toxic-appearing or diaphoretic.  HENT:     Head: Normocephalic and atraumatic.     Right Ear: Tympanic membrane, ear canal and external ear normal. There is no impacted cerumen.     Left Ear: Tympanic membrane, ear canal and external ear normal. There is no impacted cerumen.     Nose: Nose normal. No congestion or rhinorrhea.     Mouth/Throat:     Mouth: Mucous membranes are moist.     Pharynx: Oropharynx is clear. No oropharyngeal exudate or posterior oropharyngeal erythema.  Neck:     Vascular: No carotid bruit.  Cardiovascular:     Rate and Rhythm: Normal rate and regular rhythm.     Pulses: Normal pulses.     Heart sounds: Normal heart sounds. No murmur heard. Pulmonary:     Effort: Pulmonary effort is normal. No respiratory distress.     Breath sounds: Wheezing present.     Comments: Very fine wheezing noted to right upper lobe Musculoskeletal:     Cervical back: Normal range of motion and neck supple. No rigidity or tenderness.     Comments: Grossly intact  Lymphadenopathy:     Cervical: No cervical adenopathy.  Skin:    General: Skin is  warm.     Capillary Refill: Capillary refill takes less than 2 seconds.  Neurological:     Mental Status: She is alert.     Comments: Grossly intact  Psychiatric:        Mood and Affect: Mood normal.        Behavior: Behavior normal.        Assessment & Plan:   1. Cough productive of purulent sputum -Concerns for pneumonia vs bacterial rhinosinusitis due to longevity of symptoms  - DG Chest 2 View -Patient has allergy to penicillins -We will evaluate chest x-ray to determine which antibiotics more beneficial for patient.  -May continue to use over-the-counter cough medicines to help with symptoms continue to use Nettie pot -Turn to clinic in 3 days for follow-up   -Patient started on doxycycline after review of chest x-ray.  Doxycycline to both target both community-acquired pneumonia and possible rhinosinusitis.  Note:  This document was prepared using Dragon voice recognition software and may include unintentional dictation errors. Note - This record has been created using [employer].  Chart creation errors have been sought, but may not always  have been located. Such creation errors do not reflect on  the standard of medical care.

## 2021-11-14 ENCOUNTER — Encounter: Payer: Self-pay | Admitting: Nurse Practitioner

## 2021-11-14 ENCOUNTER — Telehealth: Payer: Self-pay

## 2021-11-14 ENCOUNTER — Other Ambulatory Visit: Payer: Self-pay | Admitting: Nurse Practitioner

## 2021-11-14 MED ORDER — PREDNISONE 20 MG PO TABS: 40.0000 mg | ORAL_TABLET | Freq: Every day | ORAL | 0 refills | Status: AC

## 2021-11-14 NOTE — Telephone Encounter (Signed)
Patient made aware per provider medication orders and recommendations .

## 2021-11-14 NOTE — Telephone Encounter (Signed)
Patient calls and states she believes she has been exposed to powdery mildew form her plants because she was working in the garden last week, and that this may be the cause of her current respiratory infection , her symptoms are still about the same since visit and has started taking the prescribed medication, she states she may need some corticosteroids based on her research on how to treat this spore exposure.please advise

## 2021-11-16 ENCOUNTER — Ambulatory Visit (INDEPENDENT_AMBULATORY_CARE_PROVIDER_SITE_OTHER): Payer: Medicare Other | Admitting: Nurse Practitioner

## 2021-11-16 ENCOUNTER — Encounter: Payer: Self-pay | Admitting: Nurse Practitioner

## 2021-11-16 VITALS — BP 138/76 | Temp 98.0°F | Wt 113.6 lb

## 2021-11-16 DIAGNOSIS — J189 Pneumonia, unspecified organism: Secondary | ICD-10-CM | POA: Diagnosis not present

## 2021-11-16 NOTE — Progress Notes (Signed)
   Subjective:    Patient ID: [name], female    DOB: Sep 06, 1940, 81 y.o.   MRN: [id number]  HPI  Pt arrives to follow up on cough. Pt reports cough is still lingering, however, no new symptoms. Not coughing up as much phelgm. No longer short of breath. Pt reports sleeping better last night. Pt began Prednisone this morning.   Patient believes she has been in contact with aspergillosis about a week ago. 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 Patient states that her symptoms began shortly after that. Patient concerned about possible long term effects of contact with aspergillosis.   Patient denies any fevers, chills, body aches, coughing up blood, productive cough, wheezing, shortness of breath, chest tightness.  Review of Systems  Respiratory:  Positive for cough.        Objective:   Physical Exam Vitals reviewed.  Constitutional:      General: She is not in acute distress.    Appearance: Normal appearance. She is normal weight. She is not ill-appearing, toxic-appearing or diaphoretic.  HENT:     Head: Normocephalic and atraumatic.  Cardiovascular:     Rate and Rhythm: Normal rate and regular rhythm.     Pulses: Normal pulses.     Heart sounds: Normal heart sounds. No murmur heard. Pulmonary:     Effort: Pulmonary effort is normal. No respiratory distress.     Breath sounds: Normal breath sounds. No wheezing.     Comments: CTA in all fields Musculoskeletal:     Comments: Grossly intact  Skin:    General: Skin is warm.     Capillary Refill: Capillary refill takes less than 2 seconds.  Neurological:     Mental Status: She is alert.     Comments: Grossly intact  Psychiatric:        Mood and Affect: Mood normal.        Behavior: Behavior normal.           Assessment & Plan:   1. Pneumonia - Suspect that patient had mild pneumonia that improved with abx.  Likely started by viral illness. - Low suspicion for aspergillosis due to lack of other symptoms and progression of illness.  - Discussed that cough following a viral illnesses can linger for up to 6 weeks. May continue to use over the counter cough medicines for symptoms relief. - May continue to use prednisone as prescribed. - If cough persist past 4 weeks, will consider referral to pulmonology.  - RTC in symptoms persist.

## 2021-11-23 ENCOUNTER — Encounter: Payer: Self-pay | Admitting: Nurse Practitioner

## 2022-01-15 ENCOUNTER — Other Ambulatory Visit: Payer: Self-pay | Admitting: Family Medicine

## 2022-01-15 DIAGNOSIS — I499 Cardiac arrhythmia, unspecified: Secondary | ICD-10-CM

## 2022-01-15 DIAGNOSIS — I10 Essential (primary) hypertension: Secondary | ICD-10-CM

## 2022-01-22 DIAGNOSIS — H40023 Open angle with borderline findings, high risk, bilateral: Secondary | ICD-10-CM | POA: Diagnosis not present

## 2022-01-22 DIAGNOSIS — Z961 Presence of intraocular lens: Secondary | ICD-10-CM | POA: Diagnosis not present

## 2022-01-22 DIAGNOSIS — H0288A Meibomian gland dysfunction right eye, upper and lower eyelids: Secondary | ICD-10-CM | POA: Diagnosis not present

## 2022-01-22 DIAGNOSIS — H0288B Meibomian gland dysfunction left eye, upper and lower eyelids: Secondary | ICD-10-CM | POA: Diagnosis not present

## 2022-01-22 DIAGNOSIS — H43812 Vitreous degeneration, left eye: Secondary | ICD-10-CM | POA: Diagnosis not present

## 2022-01-22 DIAGNOSIS — H04123 Dry eye syndrome of bilateral lacrimal glands: Secondary | ICD-10-CM | POA: Diagnosis not present

## 2022-01-25 ENCOUNTER — Encounter: Payer: Self-pay | Admitting: Family Medicine

## 2022-01-25 ENCOUNTER — Ambulatory Visit (INDEPENDENT_AMBULATORY_CARE_PROVIDER_SITE_OTHER): Payer: Medicare Other | Admitting: Family Medicine

## 2022-01-25 VITALS — BP 146/90 | HR 62 | Temp 97.9°F | Ht 60.0 in | Wt 115.4 lb

## 2022-01-25 DIAGNOSIS — Z0000 Encounter for general adult medical examination without abnormal findings: Secondary | ICD-10-CM

## 2022-01-25 DIAGNOSIS — E782 Mixed hyperlipidemia: Secondary | ICD-10-CM | POA: Diagnosis not present

## 2022-01-25 DIAGNOSIS — E042 Nontoxic multinodular goiter: Secondary | ICD-10-CM

## 2022-01-25 DIAGNOSIS — Z130 Encounter for screening for diseases of the blood and blood-forming organs and certain disorders involving the immune mechanism: Secondary | ICD-10-CM

## 2022-01-25 DIAGNOSIS — I10 Essential (primary) hypertension: Secondary | ICD-10-CM

## 2022-01-25 MED ORDER — ATORVASTATIN CALCIUM 40 MG PO TABS: 40.0000 mg | ORAL_TABLET | Freq: Every day | ORAL | 3 refills | Status: DC

## 2022-01-25 NOTE — Progress Notes (Signed)
Subjective:   [name] is a 81 y.o. female who presents for Medicare Annual (Subsequent) preventive examination.   Review of Systems    Difficult hearing Laceration earlier in the year - well healed. No complaints.   Objective:    Today's Vitals   01/25/22 0923  BP: (!) 146/90  Pulse: 62  Temp: 97.9 F (36.6 C)  TempSrc: Oral  Weight: 115 lb 6.4 oz (52.3 kg)  Height: 5' (1.524 m)   Body mass index is 22.54 kg/m. Physical Exam   Gen.: Well-appearing elderly female in no acute distress. HENT: Normocephalic atraumatic. Normal TMs bilaterally. Oropharynx clear.  Eyes: No scleral icterus. Normal conjunctiva. Neck: Supple. No thyromegaly or adenopathy. Heart: Regular rate and rhythm. No murmurs appreciated. No lower extremity edema. Lungs: Clear auscultation bilaterally. No rales, rhonchi, or wheezing. Abdomen: Soft, nontender, nondistended. No palpable organomegaly. No rebound or guarding. MSK: Normal range of motion. Neuro: No focal deficits. Psych: Normal mood and affect.     02/03/2021   11:40 AM 01/21/2019   10:08 AM 12/15/2018    9:44 PM 04/24/2017    1:46 PM 04/17/2017   12:58 PM 04/12/2017    1:42 PM 03/22/2017    9:44 AM  Advanced Directives  Does Patient Have a Medical Advance Directive? Yes Yes No;Yes Yes Yes Yes Yes  Type of Advance Directive Living will [place];Living will [place];Living will Living will Living will Living will Living will  Does patient want to make changes to medical advance directive?  No - Patient declined       Copy of Speculator in Chart?  No - copy requested         Current Medications (verified) Outpatient Encounter Medications as of 01/25/2022  Medication Sig   ALPRAZolam (XANAX) 0.25 MG tablet TAKE 1/2 TO 1 TABLET DAILY AS NEEDED FOR ANXIETY   aspirin EC 81 MG tablet Take 1 tablet (81 mg total) by mouth daily. Swallow whole.   Cholecalciferol (VITAMIN D) 50 MCG  (2000 UT) tablet Take 1 tablet by mouth daily.   Flaxseed, Linseed, (FLAX PO) Take 1 Dose by mouth every morning.    losartan (COZAAR) 100 MG tablet Take 1 tablet (100 mg total) by mouth every morning.   metoprolol tartrate (LOPRESSOR) 50 MG tablet TAKE (1) TABLET TWICE DAILY.   Multiple Vitamins-Minerals (CENTRUM SILVER) tablet Take 1 tablet by mouth daily.   OMEGA 3 1000 MG CAPS Take 1,000 mg by mouth at bedtime.    OVER THE COUNTER MEDICATION Calcium '600mg'$  one daily Vitamin D 2,000 iu daily   Probiotic Product (PROBIOTIC DAILY PO) Take 1 tablet by mouth daily.   Psyllium (METAMUCIL PO) Take 1 Scoop by mouth every evening.    atorvastatin (LIPITOR) 40 MG tablet Take 1 tablet (40 mg total) by mouth daily.   [DISCONTINUED] chlorhexidine (HIBICLENS) 4 % external liquid Apply topically daily as needed.   [DISCONTINUED] famotidine (PEPCID) 20 MG tablet Take 1 tablet (20 mg total) by mouth 2 (two) times daily. (Patient not taking: Reported on 01/25/2022)   [DISCONTINUED] mupirocin ointment (BACTROBAN) 2 % Apply 1 Application topically 2 (two) times daily.   No facility-administered encounter medications on file as of 01/25/2022.    Allergies (verified) Codeine and Penicillins   History: Past Medical History:  Diagnosis Date   Arthritis    Basal cell carcinoma 04/29/2019   right inner cheek [facility])   Basal cell carcinoma 04/29/2019   left side  nose [facility])   Chronic kidney disease    Diverticulosis of colon    Essential hypertension    History of basal cell carcinoma (BCC) excision    02-10-2013  left cheek  s/p  moh's sx   History of palpitations    History of recurrent UTIs    History of syncope    2015-- felt to vasovagal response to pain (per cardiologist note)   IBS (irritable bowel syndrome)    Open wound of left knee    warm soaks daily /  neosprin and dressing daily / per per still has drainage   Patellar bursitis of left knee    pre-patella septic bursitis w/ foreign body    Right thyroid nodule    x2 right side incidental finding on carotid duplex 03/ 2018-- thyroid ultrasound done , benign , follow-up in one year   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/29/20201   in situ-right upper arm-anterior   Squamous cell carcinoma of skin 11/04/2019   KA-right lower leg-anterior superior   Wears hearing aid in both ears    Past Surgical History:  Procedure Laterality Date   APPENDECTOMY  age 54   BREAST BIOPSY  1962   benign   CATARACT EXTRACTION W/PHACO  06/25/2011   Procedure: CATARACT EXTRACTION PHACO AND INTRAOCULAR LENS PLACEMENT ([place]);  Surgeon: Williams Che, MD;  Location: [facility];  Service: Ophthalmology;  Laterality: Right;  CDE: 8.90   CATARACT EXTRACTION W/PHACO Left 05/05/2012   Procedure: CATARACT EXTRACTION PHACO AND INTRAOCULAR LENS PLACEMENT ([facility]);  Surgeon: Williams Che, MD;  Location: [facility];  Service: Ophthalmology;  Laterality: Left;  CDE 12.78   COLONOSCOPY  last one 11-26-2012   EXCISION MORTON'S NEUROMA Right 2003  approx.   INCISION AND DRAINAGE WOUND WITH FOREIGN BODY REMOVAL Left 08/30/2016   Procedure: LEFT KNEE INCISION AND DRAINAGE WOUND WITH FOREIGN BODY REMOVAL;  Surgeon: Sydnee Cabal, MD;  Location: [place];  Service: Orthopedics;  Laterality: Left;   KNEE ARTHROPLASTY Right 03/14/2017   Procedure: RIGHT TOTAL KNEE ARTHROPLASTY WITH COMPUTER NAVIGATION AND REMOVAL OF HARDWARE;  Surgeon: Rod Can, MD;  Location: [facility];  Service: Orthopedics;  Laterality: Right;  Adductor Block   KNEE ARTHROSCOPY W/ ACL RECONSTRUCTION Right 1996   KNEE CARTILAGE SURGERY Right age 66   LUMBAR FUSION  08/28/2020   MOHS SURGERY  02/10/2013   left cheek -- [facility]   POSTERIOR REPAIR  05-21-2002   dr Glo Herring   symptomatic recetocele   TONSILLECTOMY AND ADENOIDECTOMY  age 59   TRANSTHORACIC ECHOCARDIOGRAM  04-17-2016  dr Domenic Polite   ef 60-65%/  trivial MR/ mild TR   VAGINAL HYSTERECTOMY  1979   w/  Bilateral  Salpingoophorectomy   Family History  Problem Relation Age of Onset   Hypertension Mother    CVA Mother    Throat cancer Father    Pseudochol deficiency Neg Hx    Malignant hyperthermia Neg Hx    Hypotension Neg Hx    Anesthesia problems Neg Hx    Colon cancer Neg Hx    Esophageal cancer Neg Hx    Social History   Socioeconomic History   Marital status: Married    Spouse name: Not on file   Number of children: 2   Years of education: Not on file   Highest education level: Not on file  Occupational History   Occupation: retired  Tobacco Use   Smoking status: Former    Packs/day: 0.50  Years: 10.00    Total pack years: 5.00    Types: Cigarettes    Quit date: 04/30/1962    Years since quitting: 59.7   Smokeless tobacco: Never  Vaping Use   Vaping Use: Never used  Substance and Sexual Activity   Alcohol use: Yes    Alcohol/week: 14.0 standard drinks of alcohol    Types: 14 Glasses of wine per week    Comment: 2 wine daily   Drug use: No   Sexual activity: Yes    Birth control/protection: Surgical  Other Topics Concern   Not on file  Social History Narrative   Not on file   Social Determinants of Health   Financial Resource Strain: Not on file  Food Insecurity: Not on file  Transportation Needs: Not on file  Physical Activity: Not on file  Stress: Not on file  Social Connections: Not on file    Tobacco Counseling Former smoker.   Diabetic? Not diabetic.   Activities of Daily Living    01/22/2022    3:39 PM  In your present state of health, do you have any difficulty performing the following activities:  Hearing? 1  Vision? 0  Difficulty concentrating or making decisions? 0  Dressing or bathing? 0  Doing errands, shopping? 0  Preparing Food and eating ? N  Using the Toilet? N  In the past six months, have you accidently leaked urine? Y  Do you have problems with loss of bowel control? N  Managing your Medications? N  Managing your Finances? N   Housekeeping or managing your Housekeeping? N    Patient Care Team: Coral Spikes, DO as PCP - General (Family Medicine) Satira Sark, MD as PCP - Cardiology (Cardiology) Lavonna Monarch, MD (Inactive) as Consulting Physician (Dermatology)     Assessment:   This is a routine wellness examination for Vickie.  Hearing/Vision screen No vision issues/concerns. Has hearing aids.   Dietary issues and exercise activities discussed: Eats healthy diet and is very active (yoga, biking, hiking).     Goals Addressed   None    Depression Screen    01/25/2022    9:20 AM 01/05/2021    9:15 AM 06/24/2020   11:10 AM 04/15/2020    3:38 PM 12/25/2017   10:48 AM 12/18/2016    9:41 AM 08/31/2014    3:15 PM  PHQ 2/9 Scores  PHQ - 2 Score 0 0 0 0 0 0 0  PHQ- 9 Score 0          Fall Risk    01/25/2022    9:19 AM 01/22/2022    3:39 PM 01/05/2021    9:15 AM 01/04/2021    3:34 PM 06/24/2020   11:09 AM  Fall Risk   Falls in the past year? 1 1 0 0 0  Number falls in past yr: 1 0 0 0 0  Injury with Fall? 1 1 0 0 0  Risk for fall due to :   No Fall Risks    Follow up   Falls evaluation completed  Falls evaluation completed    [place]: Patient is not a fall risk.  No concerns.  ASSISTIVE DEVICES UTILIZED TO PREVENT FALLS: No assistive devices needed.  Cognitive Function: Normal cognitive function.  Immunizations Immunization History  Administered Date(s) Administered   Fluad Quad(high Dose 65+) 11/03/2019, 11/18/2020   Influenza Split 12/24/2012   Influenza,inj,Quad PF,6+ Mos 12/15/2013, 12/16/2014, 12/13/2015, 12/18/2016, 12/25/2017   Influenza-Unspecified  01/06/2012   Moderna Covid-19 Vaccine Bivalent Booster 64yr & up 10/27/2020   Moderna Sars-Covid-2 Vaccination 02/17/2019, 03/30/2019, 12/10/2019   Pneumococcal Conjugate-13 02/28/2017   Pneumococcal Polysaccharide-23 03/24/2020   Td 08/10/1997   Tdap 08/07/2016   Zoster, Live  04/26/2008    TDAP status: Up to date  Flu Vaccine status: Up to date  Pneumococcal vaccine status: Up to date  Covid-19 vaccine status: Completed vaccines  Qualifies for Shingles Vaccine? Yes   Zostavax completed Yes    Screening Tests Health Maintenance  Topic Date Due   INFLUENZA VACCINE  09/05/2021   COVID-19 Vaccine (5 - 2023-24 season) 10/06/2021   Medicare Annual Wellness (AWV)  01/05/2022   DTaP/Tdap/Td (3 - Td or Tdap) 08/08/2026   Pneumonia Vaccine 81 Years old  Completed   DEXA SCAN  Completed   HPV VACCINES  Aged Out   Zoster Vaccines- Shingrix  Discontinued    Health Maintenance  Health Maintenance Due  Topic Date Due   INFLUENZA VACCINE  09/05/2021   COVID-19 Vaccine (5 - 2023-24 season) 10/06/2021   Medicare Annual Wellness (AWV)  01/05/2022    Colorectal cancer screening: No longer required.   Mammogram status: Up to date.  Bone Density status: Completed 2022.  Lung Cancer Screening: (Low Dose CT Chest recommended if Age 82-80years, 30 pack-year currently smoking OR have quit w/in 15years.) does not qualify.   Additional Screening:  Vision Screening: Recommended annual ophthalmology exams for early detection of glaucoma and other disorders of the eye.  Dental Screening: Recommended annual dental exams for proper oral hygiene    Plan:     I have personally reviewed and noted the following in the patient's chart:   Medical and social history Use of alcohol, tobacco or illicit drugs  Current medications and supplements including opioid prescriptions. Functional ability and status Nutritional status Physical activity Advanced directives List of other physicians Hospitalizations, surgeries, and ER visits in previous 12 months Vitals Screenings to include cognitive, depression, and falls Referrals and appointments  In addition, I have reviewed and discussed with patient certain preventive protocols, quality metrics, and best practice  recommendations. A written personalized care plan for preventive services as well as general preventive health recommendations were provided to patient.    JCoral Spikes DO   01/25/2022

## 2022-01-25 NOTE — Patient Instructions (Signed)
Labs at your convenience.  Follow up next year.  Take care  Dr. Lacinda Axon

## 2022-02-03 DIAGNOSIS — Z6822 Body mass index (BMI) 22.0-22.9, adult: Secondary | ICD-10-CM | POA: Diagnosis not present

## 2022-02-03 DIAGNOSIS — U071 COVID-19: Secondary | ICD-10-CM | POA: Diagnosis not present

## 2022-02-28 DIAGNOSIS — H6123 Impacted cerumen, bilateral: Secondary | ICD-10-CM | POA: Diagnosis not present

## 2022-03-06 ENCOUNTER — Other Ambulatory Visit: Payer: Self-pay | Admitting: Family Medicine

## 2022-03-06 DIAGNOSIS — I10 Essential (primary) hypertension: Secondary | ICD-10-CM

## 2022-03-18 IMAGING — DX DG CHEST 2V
3 series · 3 of 3 positions shown · non-contrast
Comparison: None.

CLINICAL DATA: Chest pain.

EXAM:
CHEST - 2 VIEW

[chest lat (1 of 2)]
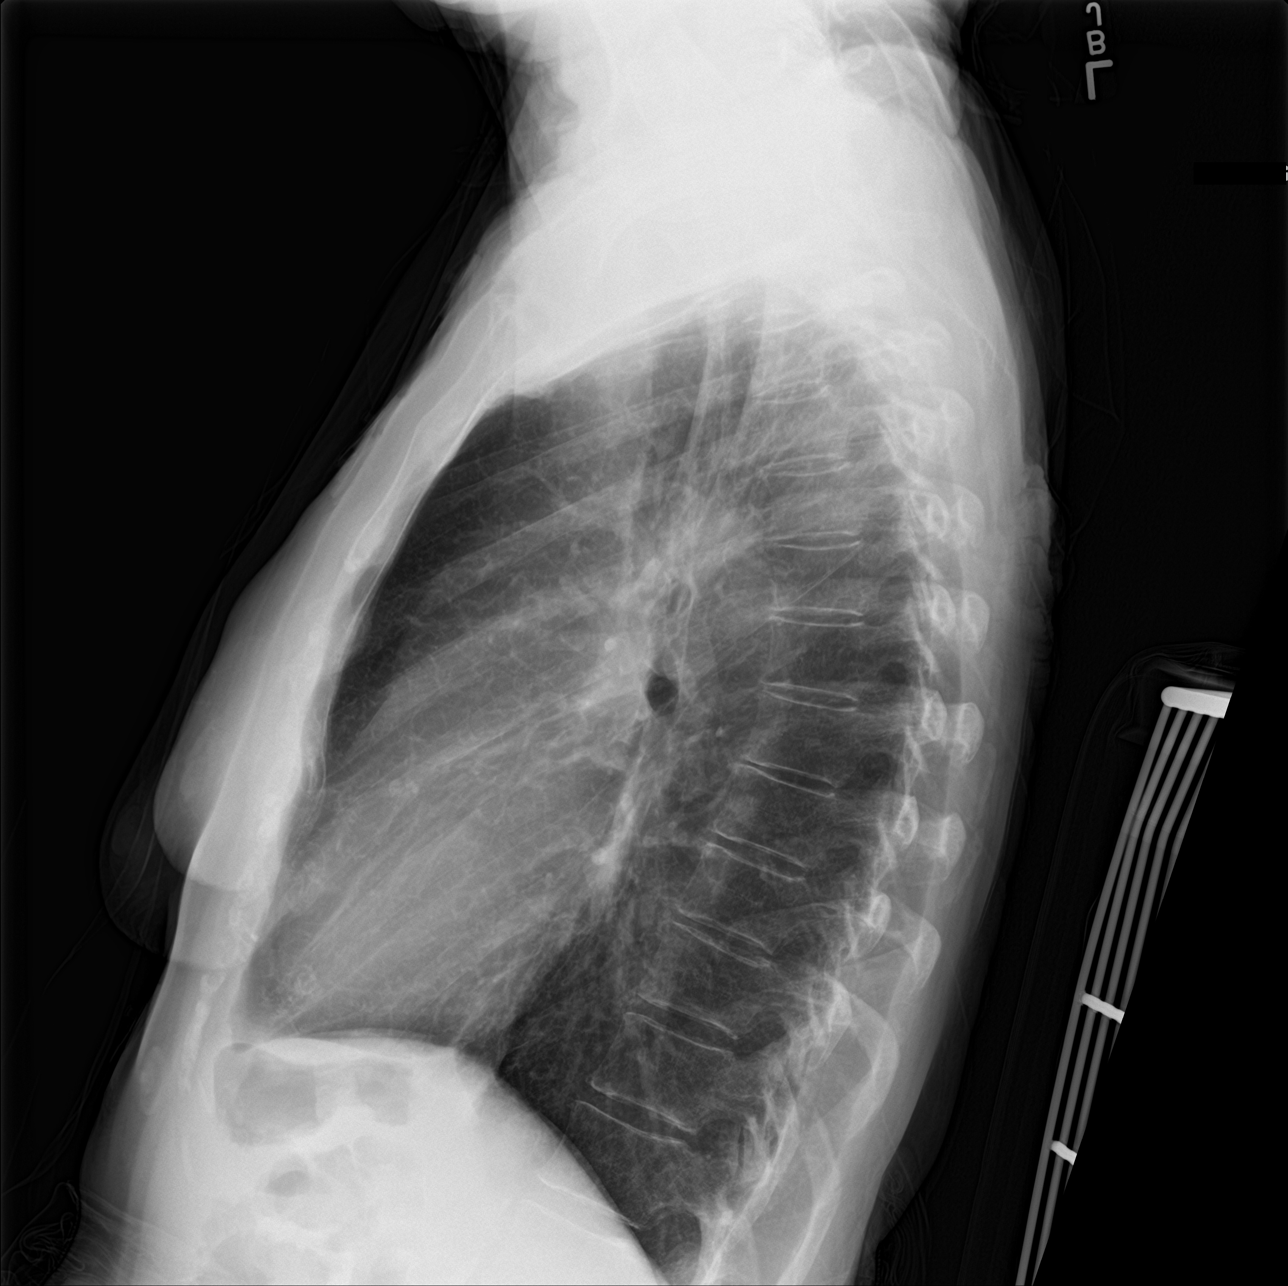

[chest ap]
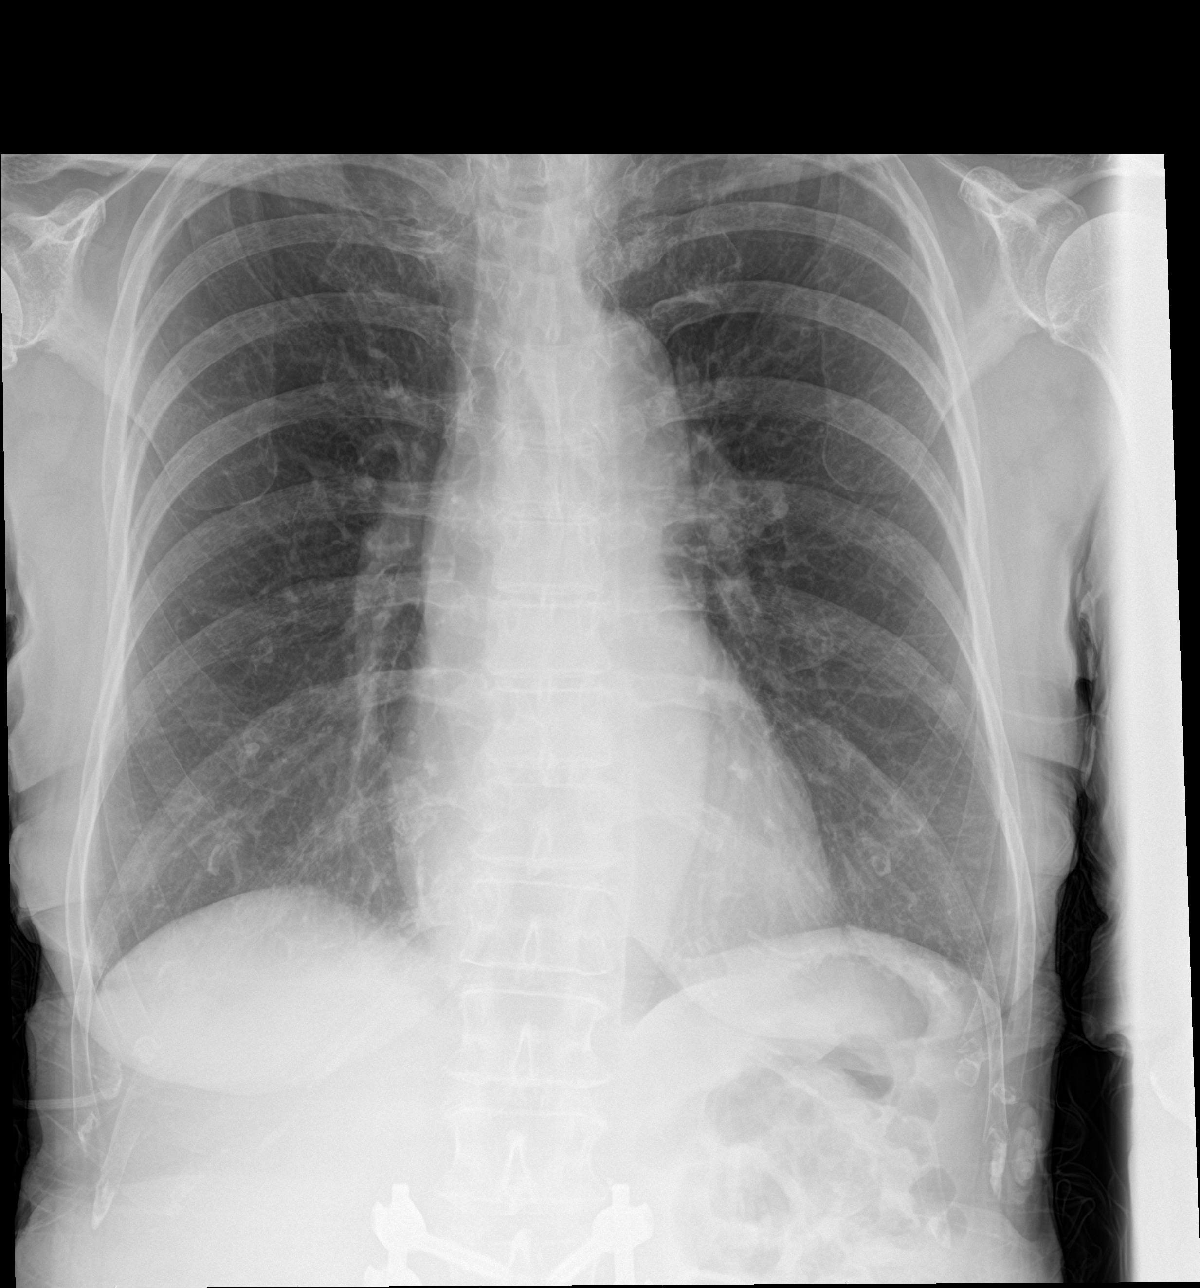

[chest lat (2 of 2)]
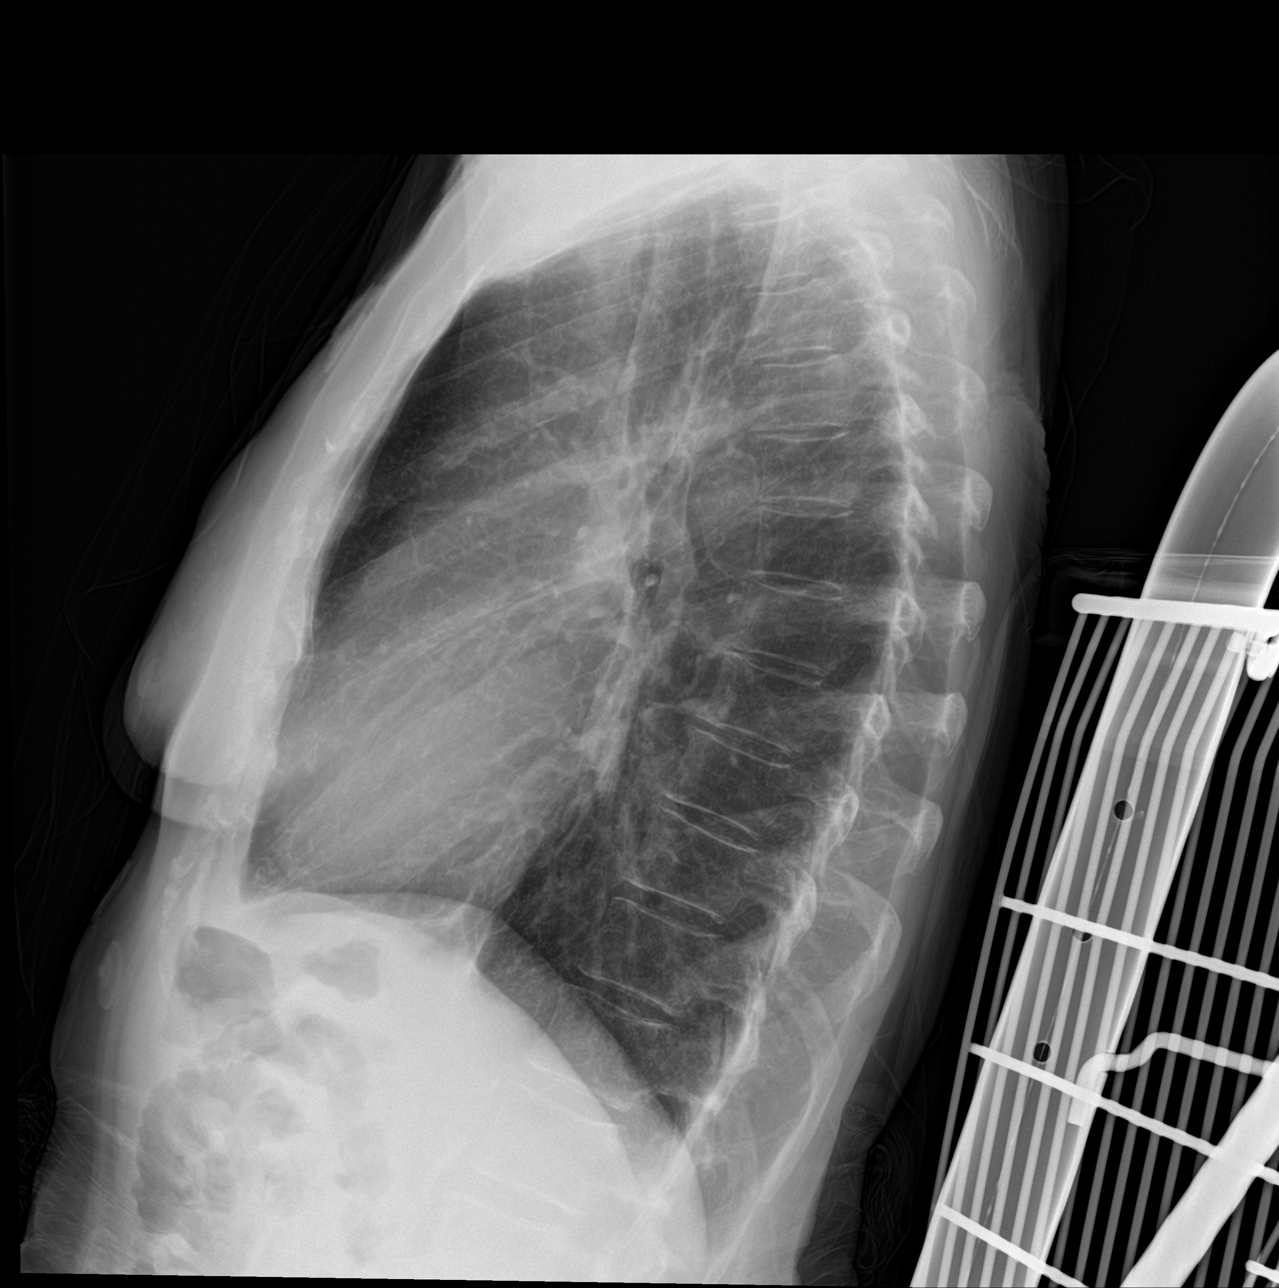

[3 of 3 positions shown; findings below may reference images not displayed]

FINDINGS: The heart size and mediastinal contours are within normal limits.
Both lungs are clear. The visualized skeletal structures are
unremarkable.
IMPRESSION: No active cardiopulmonary disease.

## 2022-03-23 ENCOUNTER — Other Ambulatory Visit (HOSPITAL_COMMUNITY): Payer: Self-pay | Admitting: Family Medicine

## 2022-03-23 DIAGNOSIS — Z1231 Encounter for screening mammogram for malignant neoplasm of breast: Secondary | ICD-10-CM

## 2022-03-26 ENCOUNTER — Telehealth: Payer: Self-pay | Admitting: Cardiology

## 2022-03-26 DIAGNOSIS — E782 Mixed hyperlipidemia: Secondary | ICD-10-CM

## 2022-03-26 DIAGNOSIS — I10 Essential (primary) hypertension: Secondary | ICD-10-CM

## 2022-03-26 NOTE — Telephone Encounter (Signed)
Pt would like a callback on whether or not she should have labs done before her schedules appointment. Please advise.

## 2022-03-26 NOTE — Telephone Encounter (Signed)
Spoke with pt regarding upcoming appointment with [name], NP in April. Pt would like to know if she needs to have labs done prior to her office visit. Will send to provider to advise. Pt is aware that Curt Bears is in the office once a week and response may be delayed. Pt verbalizes understanding.

## 2022-03-27 DIAGNOSIS — M71342 Other bursal cyst, left hand: Secondary | ICD-10-CM | POA: Diagnosis not present

## 2022-03-27 DIAGNOSIS — L821 Other seborrheic keratosis: Secondary | ICD-10-CM | POA: Diagnosis not present

## 2022-03-27 DIAGNOSIS — L570 Actinic keratosis: Secondary | ICD-10-CM | POA: Diagnosis not present

## 2022-03-27 DIAGNOSIS — L814 Other melanin hyperpigmentation: Secondary | ICD-10-CM | POA: Diagnosis not present

## 2022-03-27 DIAGNOSIS — D1801 Hemangioma of skin and subcutaneous tissue: Secondary | ICD-10-CM | POA: Diagnosis not present

## 2022-03-27 NOTE — Telephone Encounter (Signed)
Melissa Colonel, NP  You    Unless her PCP has ordered these recently, I would need CBC.CMET, Lipid profile completed. Thank you!  [place] with pt regarding lab orders per [name], NP. Orders placed and mailed to pt's home address. Labs are fasting and pt is aware. She plans to have labs done shortly before her office visit in April. Pt verbalizes understanding.

## 2022-03-28 ENCOUNTER — Ambulatory Visit (HOSPITAL_COMMUNITY)
Admission: RE | Admit: 2022-03-28 | Discharge: 2022-03-28 | Disposition: A | Discharge status: Home / Self Care | Payer: Medicare Other | Source: Ambulatory Visit | Attending: Family Medicine | Admitting: Family Medicine

## 2022-03-28 ENCOUNTER — Ambulatory Visit (HOSPITAL_COMMUNITY): Payer: Medicare Other

## 2022-03-28 ENCOUNTER — Encounter (HOSPITAL_COMMUNITY): Payer: Self-pay

## 2022-03-28 DIAGNOSIS — H02133 Senile ectropion of right eye, unspecified eyelid: Secondary | ICD-10-CM | POA: Diagnosis not present

## 2022-03-28 DIAGNOSIS — Z961 Presence of intraocular lens: Secondary | ICD-10-CM | POA: Diagnosis not present

## 2022-03-28 DIAGNOSIS — H0288A Meibomian gland dysfunction right eye, upper and lower eyelids: Secondary | ICD-10-CM | POA: Diagnosis not present

## 2022-03-28 DIAGNOSIS — Z1231 Encounter for screening mammogram for malignant neoplasm of breast: Secondary | ICD-10-CM | POA: Diagnosis not present

## 2022-03-28 DIAGNOSIS — H02834 Dermatochalasis of left upper eyelid: Secondary | ICD-10-CM | POA: Diagnosis not present

## 2022-03-28 DIAGNOSIS — H43812 Vitreous degeneration, left eye: Secondary | ICD-10-CM | POA: Diagnosis not present

## 2022-03-28 DIAGNOSIS — H40023 Open angle with borderline findings, high risk, bilateral: Secondary | ICD-10-CM | POA: Diagnosis not present

## 2022-03-28 DIAGNOSIS — H0288B Meibomian gland dysfunction left eye, upper and lower eyelids: Secondary | ICD-10-CM | POA: Diagnosis not present

## 2022-03-28 DIAGNOSIS — H02831 Dermatochalasis of right upper eyelid: Secondary | ICD-10-CM | POA: Diagnosis not present

## 2022-03-28 DIAGNOSIS — H04123 Dry eye syndrome of bilateral lacrimal glands: Secondary | ICD-10-CM | POA: Diagnosis not present

## 2022-03-29 ENCOUNTER — Other Ambulatory Visit: Payer: Self-pay | Admitting: Family Medicine

## 2022-04-02 ENCOUNTER — Ambulatory Visit: Payer: Medicare Other | Admitting: Dermatology

## 2022-04-04 ENCOUNTER — Other Ambulatory Visit: Payer: Self-pay | Admitting: Orthopedic Surgery

## 2022-04-04 ENCOUNTER — Telehealth: Payer: Self-pay | Admitting: Cardiology

## 2022-04-04 DIAGNOSIS — M67442 Ganglion, left hand: Secondary | ICD-10-CM | POA: Diagnosis not present

## 2022-04-04 DIAGNOSIS — M19042 Primary osteoarthritis, left hand: Secondary | ICD-10-CM | POA: Diagnosis not present

## 2022-04-04 NOTE — Telephone Encounter (Signed)
   Pre-operative Risk Assessment    Patient Name: [name]  DOB: 1940/12/25 MRN: [id number]      Request for Surgical Clearance    Procedure:   Left Ring Finger Excision Mucoid Cyst and Distal Interphalangeal Joint Debridement, Possible Rotation Flap  Date of Surgery:  Clearance 04/16/22                                 Surgeon: Dr. Leanora Cover Surgeon's Group or Practice Name:  [facility] Phone number:  [phone]  Fax number:  [phone]   Type of Clearance Requested:   - Medical  - Pharmacy:  Hold Aspirin     Type of Anesthesia:   Choice   Additional requests/questions:   Caller stated they will need to know how many days to hold patient's Aspirin.    Signed, Heloise Beecham   04/04/2022, 3:59 PM

## 2022-04-05 NOTE — Telephone Encounter (Signed)
Pt has been scheduled for pre op appt in the [facility] office 04/10/22 @ 9 am. Pt states she wants to keep her appt with [name], DNP 06/01/22 as well. Pt thanked me for the help. I will update all parties involved.

## 2022-04-05 NOTE — Telephone Encounter (Signed)
   Name: [name]  DOB: 1940/03/30  MRN: [id number]  Primary Cardiologist: Rozann Lesches, MD  Chart reviewed as part of pre-operative protocol coverage. Because of Kennadee Kulikowski Minniear's past medical history and time since last visit, she will require a follow-up in-office visit in order to better assess preoperative cardiovascular risk.  Pre-op covering staff: - Please schedule appointment and call patient to inform them. If patient already had an upcoming appointment within acceptable timeframe, please add "pre-op clearance" to the appointment notes so provider is aware. - Please contact requesting surgeon's office via preferred method (i.e, phone, fax) to inform them of need for appointment prior to surgery.  Okay to hold ASA x 7 days prior to her procedure as long as no symptoms at the time of in-person appointment.  Elgie Collard, PA-C  04/05/2022, 8:01 AM

## 2022-04-09 ENCOUNTER — Encounter (HOSPITAL_BASED_OUTPATIENT_CLINIC_OR_DEPARTMENT_OTHER): Payer: Self-pay | Admitting: Orthopedic Surgery

## 2022-04-10 ENCOUNTER — Encounter: Payer: Self-pay | Admitting: Nurse Practitioner

## 2022-04-10 ENCOUNTER — Ambulatory Visit: Payer: Medicare Other | Attending: Nurse Practitioner | Admitting: Nurse Practitioner

## 2022-04-10 VITALS — BP 142/80 | HR 52 | Ht 60.0 in | Wt 117.6 lb

## 2022-04-10 DIAGNOSIS — I10 Essential (primary) hypertension: Secondary | ICD-10-CM | POA: Diagnosis not present

## 2022-04-10 DIAGNOSIS — I6522 Occlusion and stenosis of left carotid artery: Secondary | ICD-10-CM

## 2022-04-10 DIAGNOSIS — G458 Other transient cerebral ischemic attacks and related syndromes: Secondary | ICD-10-CM

## 2022-04-10 DIAGNOSIS — Z01810 Encounter for preprocedural cardiovascular examination: Secondary | ICD-10-CM | POA: Diagnosis not present

## 2022-04-10 DIAGNOSIS — I771 Stricture of artery: Secondary | ICD-10-CM

## 2022-04-10 DIAGNOSIS — I701 Atherosclerosis of renal artery: Secondary | ICD-10-CM

## 2022-04-10 NOTE — Progress Notes (Signed)
Office Visit    Patient Name: [name] Date of Encounter: 04/10/2022  PCP:  Coral Spikes DO   [facility]  Cardiologist:  Rozann Lesches, MD  Advanced Practice Provider:  Finis Bud, NP Electrophysiologist:  None   Chief Complaint    [name] is a delightful 82 y.o. female with a hx of hypertension, carotid artery disease, right subclavian steal syndrome, PAD, lumbar stenosis, right thyroid nodule, IBS, and CKD  who presents today for preoperative cardiovascular risk assessment for pending left ring finger excision mucoid cyst and distal interphalangeal joint debridement, possible rotation flap on April 16, 2022.   Past Medical History    Past Medical History:  Diagnosis Date   Arthritis    Basal cell carcinoma 04/29/2019   right inner cheek [facility])   Basal cell carcinoma 04/29/2019   left side nose (MOHs)   Chronic kidney disease    Diverticulosis of colon    Essential hypertension    History of basal cell carcinoma (BCC) excision    02-10-2013  left cheek  s/p  moh's sx   History of palpitations    History of recurrent UTIs    History of syncope    2015-- felt to vasovagal response to pain (per cardiologist note)   IBS (irritable bowel syndrome)    Open wound of left knee    warm soaks daily /  neosprin and dressing daily / per per still has drainage   Patellar bursitis of left knee    pre-patella septic bursitis w/ foreign body   Right thyroid nodule    x2 right side incidental finding on carotid duplex 03/ 2018-- thyroid ultrasound done , benign , follow-up in one year   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/29/20201   in situ-right upper arm-anterior   Squamous cell carcinoma of skin 11/04/2019   KA-right lower leg-anterior superior   Wears hearing aid in both ears    Past Surgical History:  Procedure Laterality Date   APPENDECTOMY  age 57   BREAST BIOPSY  1962   benign   CATARACT EXTRACTION W/PHACO  06/25/2011    Procedure: CATARACT EXTRACTION PHACO AND INTRAOCULAR LENS PLACEMENT ([place]);  Surgeon: Williams Che, MD;  Location: [facility];  Service: Ophthalmology;  Laterality: Right;  CDE: 8.90   CATARACT EXTRACTION W/PHACO Left 05/05/2012   Procedure: CATARACT EXTRACTION PHACO AND INTRAOCULAR LENS PLACEMENT ([facility]);  Surgeon: Williams Che, MD;  Location: [facility];  Service: Ophthalmology;  Laterality: Left;  CDE 12.78   COLONOSCOPY  last one 11-26-2012   EXCISION MORTON'S NEUROMA Right 2003  approx.   INCISION AND DRAINAGE WOUND WITH FOREIGN BODY REMOVAL Left 08/30/2016   Procedure: LEFT KNEE INCISION AND DRAINAGE WOUND WITH FOREIGN BODY REMOVAL;  Surgeon: Sydnee Cabal, MD;  Location: [place];  Service: Orthopedics;  Laterality: Left;   KNEE ARTHROPLASTY Right 03/14/2017   Procedure: RIGHT TOTAL KNEE ARTHROPLASTY WITH COMPUTER NAVIGATION AND REMOVAL OF HARDWARE;  Surgeon: Rod Can, MD;  Location: [facility];  Service: Orthopedics;  Laterality: Right;  Adductor Block   KNEE ARTHROSCOPY W/ ACL RECONSTRUCTION Right 1996   KNEE CARTILAGE SURGERY Right age 39   LUMBAR FUSION  08/28/2020   MOHS SURGERY  02/10/2013   left cheek -- [facility]   POSTERIOR REPAIR  05-21-2002   dr Glo Herring   symptomatic recetocele   TONSILLECTOMY AND ADENOIDECTOMY  age 57   TRANSTHORACIC ECHOCARDIOGRAM  04-17-2016  dr Alease Frame  60-65%/  trivial MR/ mild TR   VAGINAL HYSTERECTOMY  1979   w/  Bilateral Salpingoophorectomy    Allergies  Allergies  Allergen Reactions   Codeine Other (See Comments)    REACTION: syncope   Penicillins Rash    Can tolerate cephalosporins    History of Present Illness    [name] is a 82 y.o. female with a past medical history as mentioned above.  Last seen by [name], NP on June 02, 2021.  Was overall doing well from a cardiac perspective.  BP was elevated, therefore losartan was increased to 100 mg daily by PCP.  Recommended to take blood pressure in  left arm only due to right subclavian steal syndrome.  Today she presents for preoperative cardiovascular risk assessment.  Doing well from a cardiac perspective. Denies any chest pain, shortness of breath, palpitations, syncope, presyncope, dizziness, orthopnea, PND, swelling or significant weight changes, acute bleeding, or claudication.  Stays very active.  Admits to whitecoat hypertension.  Blood pressure log shows recent readings are overall well-controlled.  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  Enjoys doing yoga, hiking, and walking.  EKGs/Labs/Other Studies Reviewed:   The following studies were reviewed today:   EKG:  EKG is ordered today.  The ekg ordered today demonstrates sinus rhythm, 61 bpm, no acute ischemic changes.  Renal artery duplex on May 06, 2020: Right: Normal size right kidney. 1-59% stenosis of the right renal         artery. RRV flow present. Normal cortical thickness of right         kidney. Abnormal right Resistive Index.  Left:  Normal size of left kidney. Abnormal left Resistive Index. No         evidence of left renal artery stenosis. LRV flow present.         Normal cortical thickness of the left kidney.  Mesenteric:  Normal Superior Mesenteric artery findings. 70 to 99% stenosis in the  celiac artery.  Echocardiogram on April 29, 2020: 1. Left ventricular ejection fraction, by estimation, is 70 to 75%. The  left ventricle has hyperdynamic function. The left ventricle has no  regional wall motion abnormalities. Left ventricular diastolic parameters  were normal.   2. Right ventricular systolic function is normal. The right ventricular  size is normal. There is normal pulmonary artery systolic pressure.   3. The mitral valve is normal in structure. Mild mitral valve  regurgitation.   4. The aortic valve is tricuspid. Aortic valve regurgitation is not  visualized. Mild aortic valve sclerosis is present, with no evidence of  aortic  valve stenosis.   5. The inferior vena cava is normal in size with greater than 50%  respiratory variability, suggesting right atrial pressure of 3 mmHg.   Comparison(s): The left ventricular function is unchanged.  Bilateral carotid duplex on April 19, 2020: Summary:  Right Carotid: There is no evidence of stenosis in the right ICA. The                 extracranial vessels were near-normal with only minimal  wall                thickening or plaque. Decrease velocities in the RICA  compared to                 prior exam.   Left Carotid: Velocities in the left ICA are consistent with a 1-39%  stenosis.  Decrease velocities in the LICA compared to prior exam.   Vertebrals:  Bilateral vertebral arteries demonstrate antegrade flow.  Subclavians: Normal flow hemodynamics were seen in the left subclavian  artery.              Monophasic flow noted in the right subclavian artery and  right              innominate suggesting a high grade stenosis more proximally.   Recent Labs: 05/30/2021: ALT 17; BUN 10; Creatinine, Ser 0.69; Hemoglobin 14.8; Platelets 273; Potassium 4.6; Sodium 141  Recent Lipid Panel    Component Value Date/Time   CHOL 198 05/30/2021 0825   TRIG 63 05/30/2021 0825   HDL 102 05/30/2021 0825   CHOLHDL 1.9 05/30/2021 0825   CHOLHDL 2.8 01/22/2013 0803   VLDL 13 01/22/2013 0803   LDLCALC 84 05/30/2021 0825   Home Medications   Current Meds  Medication Sig   ALPRAZolam (XANAX) 0.25 MG tablet TAKE 1/2 TO 1 TABLET DAILY AS NEEDED FOR ANXIETY   aspirin EC 81 MG tablet Take 1 tablet (81 mg total) by mouth daily. Swallow whole.   atorvastatin (LIPITOR) 40 MG tablet TAKE 1 TABLET ONCE DAILY.   Cholecalciferol (VITAMIN D) 50 MCG (2000 UT) tablet Take 1 tablet by mouth daily.   Flaxseed, Linseed, (FLAX PO) Take 1 Dose by mouth every morning.    losartan (COZAAR) 100 MG tablet TAKE 1 TABLET BY MOUTH EVERY MORNING.   metoprolol tartrate (LOPRESSOR) 50 MG  tablet TAKE (1) TABLET TWICE DAILY.   Multiple Vitamins-Minerals (CENTRUM SILVER) tablet Take 1 tablet by mouth daily.   OMEGA 3 1000 MG CAPS Take 1,000 mg by mouth at bedtime.    OVER THE COUNTER MEDICATION Calcium '600mg'$  one daily Vitamin D 2,000 iu daily   Probiotic Product (PROBIOTIC DAILY PO) Take 1 tablet by mouth daily.   Psyllium (METAMUCIL PO) Take 1 Scoop by mouth every evening.      Review of Systems    All other systems reviewed and are otherwise negative except as noted above.  Physical Exam    VS:  BP (!) 142/80 (BP Location: Left Arm, Patient Position: Sitting, Cuff Size: Normal)   Pulse (!) 52   Ht 5' (1.524 m)   Wt 117 lb 9.6 oz (53.3 kg)   LMP  (LMP Unknown)   SpO2 99%   BMI 22.97 kg/m  , BMI Body mass index is 22.97 kg/m.  Wt Readings from Last 3 Encounters:  04/10/22 117 lb 9.6 oz (53.3 kg)  01/25/22 115 lb 6.4 oz (52.3 kg)  11/16/21 113 lb 9.6 oz (51.5 kg)     GEN: Well nourished, well developed, in no acute distress. HEENT: normal. Neck: Supple, no JVD, carotid bruits, or masses. Cardiac: S1/S2, RRR, no murmurs, rubs, or gallops. No clubbing, cyanosis, edema.  Radials/PT 2+ and equal bilaterally.  Respiratory:  Respirations regular and unlabored, clear to auscultation bilaterally. GI: Soft, nontender, nondistended. MS: Arthritis noted to bilateral phalanges, mucoid cyst noted to left ring finger Skin: Thin skin, warm and dry, no rash. Neuro:  Strength and sensation are intact. Psych: Normal affect.  Assessment & Plan    Pre-operative cardiovascular risk assessment Ms. Corella's perioperative risk of a major cardiac event is 0.4% according to the Revised Cardiac Risk Index (RCRI).  Therefore, she is at low risk for perioperative complications.   Her functional capacity is excellent at 9.25 METs according to the Duke Activity Status Index (DASI). Recommendations: According to  ACC/AHA guidelines, no further cardiovascular testing needed.  The patient  may proceed to surgery at acceptable risk.   Antiplatelet and/or Anticoagulation Recommendations: Prefer aspirin to be continued perioperatively unless surgeon feels bleeding risk is too high.  If bleeding risk is felt to be too high, okay to hold aspirin 7 days prior to procedure.  Does not require SBE prophylaxis.  Will route note to requesting party.  2.  Hypertension Blood pressure on arrival 164/98, repeat BP 142/80.  Admits to whitecoat hypertension.  BP log shows overall well-controlled blood pressures. Discussed to monitor BP at home at least 2 hours after medications and sitting for 5-10 minutes.  Encouraged to continue to log blood pressures.  Discussed with her if SBP consistently greater than 140, to notify office for titration of antihypertensives.  If blood pressure not controlled in future, recommend considering chlorthalidone initiation.  Continue current medical management. Heart healthy diet and regular cardiovascular exercise encouraged.   3.  Carotid artery stenosis, right subclavian steal, renal artery stenosis, celiac artery stenosis (PAD) No evidence of carotid artery stenosis on the right, 1 to 39% stenosis along the left ICA.  Monophasic flow noted to right subclavian artery, suggestive of high-grade stenosis more proximally. No BP on right arm.  Denies any concerning/red flag symptoms.  1-59% stenosis of right renal artery, no RAS noted to the left renal artery.  70-99% stenosis along celiac artery.  Denies any symptoms, continue current medication regimen.  Will continue to monitor. Heart healthy diet and regular cardiovascular exercise encouraged.  If symptoms were to develop, consider referral to [facility]. ED precautions discussed.    Disposition: Follow up in 1 year(s) with Rozann Lesches, MD or [facility].  Signed, Finis Bud, NP 04/10/2022, 9:42 AM [place]

## 2022-04-10 NOTE — Patient Instructions (Signed)

## 2022-04-12 NOTE — Progress Notes (Signed)
Pt unable to come in for lab work, instructed pt to come in at 11am day of surgery to complete lab work. 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 Pt wanted to make [place] aware of this situation. Reassured pt that we will work with her and have hearing aids available quickly after surgery.

## 2022-04-16 ENCOUNTER — Ambulatory Visit (HOSPITAL_BASED_OUTPATIENT_CLINIC_OR_DEPARTMENT_OTHER): Payer: Medicare Other | Admitting: Anesthesiology

## 2022-04-16 ENCOUNTER — Encounter (HOSPITAL_BASED_OUTPATIENT_CLINIC_OR_DEPARTMENT_OTHER)
Admission: RE | Disposition: A | Discharge status: Home / Self Care | Payer: Self-pay | Source: Home / Self Care | Attending: Orthopedic Surgery

## 2022-04-16 ENCOUNTER — Other Ambulatory Visit: Payer: Self-pay

## 2022-04-16 ENCOUNTER — Encounter (HOSPITAL_BASED_OUTPATIENT_CLINIC_OR_DEPARTMENT_OTHER): Payer: Self-pay | Admitting: Orthopedic Surgery

## 2022-04-16 ENCOUNTER — Ambulatory Visit (HOSPITAL_BASED_OUTPATIENT_CLINIC_OR_DEPARTMENT_OTHER)
Admission: RE | Admit: 2022-04-16 | Discharge: 2022-04-16 | Disposition: A | Discharge status: Home / Self Care | Payer: Medicare Other | Attending: Orthopedic Surgery | Admitting: Orthopedic Surgery

## 2022-04-16 DIAGNOSIS — I129 Hypertensive chronic kidney disease with stage 1 through stage 4 chronic kidney disease, or unspecified chronic kidney disease: Secondary | ICD-10-CM | POA: Insufficient documentation

## 2022-04-16 DIAGNOSIS — I10 Essential (primary) hypertension: Secondary | ICD-10-CM | POA: Diagnosis not present

## 2022-04-16 DIAGNOSIS — M71349 Other bursal cyst, unspecified hand: Secondary | ICD-10-CM | POA: Diagnosis not present

## 2022-04-16 DIAGNOSIS — Z79899 Other long term (current) drug therapy: Secondary | ICD-10-CM | POA: Diagnosis not present

## 2022-04-16 DIAGNOSIS — M67442 Ganglion, left hand: Secondary | ICD-10-CM | POA: Diagnosis not present

## 2022-04-16 DIAGNOSIS — M19042 Primary osteoarthritis, left hand: Secondary | ICD-10-CM | POA: Insufficient documentation

## 2022-04-16 DIAGNOSIS — Z85828 Personal history of other malignant neoplasm of skin: Secondary | ICD-10-CM | POA: Insufficient documentation

## 2022-04-16 DIAGNOSIS — I700 Atherosclerosis of aorta: Secondary | ICD-10-CM | POA: Diagnosis not present

## 2022-04-16 DIAGNOSIS — Z87891 Personal history of nicotine dependence: Secondary | ICD-10-CM | POA: Insufficient documentation

## 2022-04-16 DIAGNOSIS — N189 Chronic kidney disease, unspecified: Secondary | ICD-10-CM | POA: Insufficient documentation

## 2022-04-16 DIAGNOSIS — E785 Hyperlipidemia, unspecified: Secondary | ICD-10-CM | POA: Diagnosis not present

## 2022-04-16 DIAGNOSIS — M1990 Unspecified osteoarthritis, unspecified site: Secondary | ICD-10-CM | POA: Diagnosis not present

## 2022-04-16 DIAGNOSIS — K219 Gastro-esophageal reflux disease without esophagitis: Secondary | ICD-10-CM | POA: Diagnosis not present

## 2022-04-16 HISTORY — PX: CYST EXCISION: SHX5701

## 2022-04-16 SURGERY — CYST REMOVAL
Anesthesia: Monitor Anesthesia Care | Site: Ring Finger | Laterality: Left

## 2022-04-16 MED ORDER — ROPIVACAINE HCL 5 MG/ML IJ SOLN
INTRAMUSCULAR | Status: DC | PRN
  Administered 2022-04-16: 30 mL via PERINEURAL

## 2022-04-16 MED ORDER — PROPOFOL 500 MG/50ML IV EMUL
INTRAVENOUS | Status: DC | PRN
  Administered 2022-04-16: 75 ug/kg/min via INTRAVENOUS

## 2022-04-16 MED ORDER — FENTANYL CITRATE (PF) 100 MCG/2ML IJ SOLN
INTRAMUSCULAR | Status: AC
  Filled 2022-04-16: qty 2

## 2022-04-16 MED ORDER — FENTANYL CITRATE (PF) 100 MCG/2ML IJ SOLN: 25.0000 ug | INTRAMUSCULAR | Status: DC | PRN

## 2022-04-16 MED ORDER — VANCOMYCIN HCL IN DEXTROSE 1-5 GM/200ML-% IV SOLN
INTRAVENOUS | Status: AC
  Filled 2022-04-16: qty 200

## 2022-04-16 MED ORDER — VANCOMYCIN HCL 1000 MG IV SOLR
INTRAVENOUS | Status: DC | PRN
  Administered 2022-04-16: 1000 mg via INTRAVENOUS

## 2022-04-16 MED ORDER — LACTATED RINGERS IV SOLN: INTRAVENOUS | Status: DC

## 2022-04-16 MED ORDER — ONDANSETRON HCL 4 MG/2ML IJ SOLN
INTRAMUSCULAR | Status: DC | PRN
  Administered 2022-04-16: 4 mg via INTRAVENOUS

## 2022-04-16 MED ORDER — OXYCODONE HCL 5 MG/5ML PO SOLN: 5.0000 mg | Freq: Once | ORAL | Status: DC | PRN

## 2022-04-16 MED ORDER — MIDAZOLAM HCL 2 MG/2ML IJ SOLN
INTRAMUSCULAR | Status: AC
  Filled 2022-04-16: qty 2

## 2022-04-16 MED ORDER — LIDOCAINE 2% (20 MG/ML) 5 ML SYRINGE
INTRAMUSCULAR | Status: DC | PRN
  Administered 2022-04-16: 60 mg via INTRAVENOUS

## 2022-04-16 MED ORDER — FENTANYL CITRATE (PF) 100 MCG/2ML IJ SOLN
75.0000 ug | Freq: Once | INTRAMUSCULAR | Status: AC
  Administered 2022-04-16: 75 ug via INTRAVENOUS

## 2022-04-16 MED ORDER — VANCOMYCIN HCL IN DEXTROSE 1-5 GM/200ML-% IV SOLN
1000.0000 mg | INTRAVENOUS | Status: AC
  Administered 2022-04-16: 1000 mg via INTRAVENOUS

## 2022-04-16 MED ORDER — HYDROCODONE-ACETAMINOPHEN 5-325 MG PO TABS: ORAL_TABLET | ORAL | 0 refills | Status: DC

## 2022-04-16 MED ORDER — ONDANSETRON HCL 4 MG/2ML IJ SOLN: 4.0000 mg | Freq: Once | INTRAMUSCULAR | Status: DC | PRN

## 2022-04-16 MED ORDER — 0.9 % SODIUM CHLORIDE (POUR BTL) OPTIME
TOPICAL | Status: DC | PRN
  Administered 2022-04-16: 30 mL

## 2022-04-16 MED ORDER — OXYCODONE HCL 5 MG PO TABS: 5.0000 mg | ORAL_TABLET | Freq: Once | ORAL | Status: DC | PRN

## 2022-04-16 SURGICAL SUPPLY — 57 items
APL PRP STRL LF DISP 70% ISPRP (MISCELLANEOUS) ×2
APL SKNCLS STERI-STRIP NONHPOA (GAUZE/BANDAGES/DRESSINGS)
BANDAGE GAUZE 1X75IN STRL (MISCELLANEOUS) IMPLANT
BENZOIN TINCTURE PRP APPL 2/3 (GAUZE/BANDAGES/DRESSINGS) IMPLANT
BLADE MINI RND TIP GREEN BEAV (BLADE) IMPLANT
BLADE SURG 15 STRL LF DISP TIS (BLADE) ×4 IMPLANT
BLADE SURG 15 STRL SS (BLADE) ×4
BNDG CMPR 5X2 CHSV 1 LYR STRL (GAUZE/BANDAGES/DRESSINGS)
BNDG CMPR 5X2 KNTD ELC UNQ LF (GAUZE/BANDAGES/DRESSINGS)
BNDG CMPR 5X3 KNIT ELC UNQ LF (GAUZE/BANDAGES/DRESSINGS)
BNDG CMPR 75X11 PLY HI ABS (MISCELLANEOUS)
BNDG CMPR 75X21 PLY HI ABS (MISCELLANEOUS)
BNDG CMPR 9X4 STRL LF SNTH (GAUZE/BANDAGES/DRESSINGS) ×2
BNDG COHESIVE 1X5 TAN STRL LF (GAUZE/BANDAGES/DRESSINGS) ×1 IMPLANT
BNDG COHESIVE 2X5 TAN ST LF (GAUZE/BANDAGES/DRESSINGS) IMPLANT
BNDG ELASTIC 2INX 5YD STR LF (GAUZE/BANDAGES/DRESSINGS) IMPLANT
BNDG ELASTIC 3INX 5YD STR LF (GAUZE/BANDAGES/DRESSINGS) IMPLANT
BNDG ESMARK 4X9 LF (GAUZE/BANDAGES/DRESSINGS) ×1 IMPLANT
BNDG GAUZE 1X75IN STRL (MISCELLANEOUS)
BNDG GAUZE DERMACEA FLUFF 4 (GAUZE/BANDAGES/DRESSINGS) IMPLANT
BNDG GZE DERMACEA 4 6PLY (GAUZE/BANDAGES/DRESSINGS)
BNDG PLASTER X FAST 3X3 WHT LF (CAST SUPPLIES) IMPLANT
BNDG PLSTR 9X3 FST ST WHT (CAST SUPPLIES)
CHLORAPREP W/TINT 26 (MISCELLANEOUS) ×2 IMPLANT
CORD BIPOLAR FORCEPS 12FT (ELECTRODE) ×2 IMPLANT
COVER BACK TABLE 60X90IN (DRAPES) ×2 IMPLANT
COVER MAYO STAND STRL (DRAPES) ×2 IMPLANT
CUFF TOURN SGL QUICK 18X4 (TOURNIQUET CUFF) ×2 IMPLANT
DRAPE EXTREMITY T 121X128X90 (DISPOSABLE) ×2 IMPLANT
DRAPE SURG 17X23 STRL (DRAPES) ×2 IMPLANT
GAUZE SPONGE 4X4 12PLY STRL (GAUZE/BANDAGES/DRESSINGS) ×2 IMPLANT
GAUZE STRETCH 2X75IN STRL (MISCELLANEOUS) IMPLANT
GAUZE XEROFORM 1X8 LF (GAUZE/BANDAGES/DRESSINGS) ×2 IMPLANT
GLOVE BIO SURGEON STRL SZ7.5 (GLOVE) ×2 IMPLANT
GLOVE BIOGEL PI IND STRL 7.0 (GLOVE) ×2 IMPLANT
GLOVE BIOGEL PI IND STRL 8 (GLOVE) ×2 IMPLANT
GOWN STRL REUS W/ TWL LRG LVL3 (GOWN DISPOSABLE) ×2 IMPLANT
GOWN STRL REUS W/TWL LRG LVL3 (GOWN DISPOSABLE) ×2
NDL HYPO 25X1 1.5 SAFETY (NEEDLE) ×1 IMPLANT
NEEDLE HYPO 25X1 1.5 SAFETY (NEEDLE) IMPLANT
NS IRRIG 1000ML POUR BTL (IV SOLUTION) ×2 IMPLANT
PACK BASIN DAY SURGERY FS (CUSTOM PROCEDURE TRAY) ×2 IMPLANT
PAD CAST 3X4 CTTN HI CHSV (CAST SUPPLIES) IMPLANT
PAD CAST 4YDX4 CTTN HI CHSV (CAST SUPPLIES) IMPLANT
PADDING CAST ABS COTTON 4X4 ST (CAST SUPPLIES) ×1 IMPLANT
PADDING CAST COTTON 3X4 STRL (CAST SUPPLIES)
PADDING CAST COTTON 4X4 STRL (CAST SUPPLIES)
SLING ARM FOAM STRAP MED (SOFTGOODS) ×1 IMPLANT
SPLINT FINGER 3.25 911903 (SOFTGOODS) ×1 IMPLANT
STOCKINETTE 4X48 STRL (DRAPES) ×2 IMPLANT
STRIP CLOSURE SKIN 1/2X4 (GAUZE/BANDAGES/DRESSINGS) IMPLANT
SUT ETHILON 3 0 PS 1 (SUTURE) IMPLANT
SUT ETHILON 4 0 PS 2 18 (SUTURE) ×2 IMPLANT
SYR BULB EAR ULCER 3OZ GRN STR (SYRINGE) ×2 IMPLANT
SYR CONTROL 10ML LL (SYRINGE) ×1 IMPLANT
TOWEL GREEN STERILE FF (TOWEL DISPOSABLE) ×4 IMPLANT
UNDERPAD 30X36 HEAVY ABSORB (UNDERPADS AND DIAPERS) ×2 IMPLANT

## 2022-04-16 NOTE — Discharge Instructions (Addendum)

## 2022-04-16 NOTE — Transfer of Care (Signed)
Immediate Anesthesia Transfer of Care Note  Patient: [name]  Procedure(s) Performed: LEFT RING FINGER EXCISION MUCOID CYST AND DISTAL INTERPHALANGEAL JOINT DEBRIDEMENT (Left)  Patient Location: PACU  Anesthesia Type:MAC combined with regional for post-op pain  Level of Consciousness: awake, alert , and oriented  Airway & Oxygen Therapy: Patient Spontanous Breathing and Patient connected to face mask oxygen  Post-op Assessment: Report given to RN and Post -op Vital signs reviewed and stable  Post vital signs: Reviewed and stable  Last Vitals:  Vitals Value Taken Time  BP 199/93 (124) 04/16/22 1506  Temp    Pulse 56 04/16/22 1507  Resp 23 04/16/22 1507  SpO2 96 % 04/16/22 1507  Vitals shown include unvalidated device data.  Last Pain:  Vitals:   04/16/22 1159  TempSrc: Oral  PainSc: 0-No pain         Complications: No notable events documented.

## 2022-04-16 NOTE — H&P (Signed)
[name] is an 82 y.o. female.   Chief Complaint: mucoid cyst HPI: 82 yo female with mucoid cyst and dip joint arthritis left ring finger.  It is bothersome to her.  She wishes to have it removed and the dip joint debrided to try to prevent recurrence.  Allergies:  Allergies  Allergen Reactions   Codeine Other (See Comments)    REACTION: syncope   Penicillins Rash    Can tolerate cephalosporins    Past Medical History:  Diagnosis Date   Arthritis    Basal cell carcinoma 04/29/2019   right inner cheek [facility])   Basal cell carcinoma 04/29/2019   left side nose (MOHs)   Chronic kidney disease    Diverticulosis of colon    Essential hypertension    History of basal cell carcinoma (BCC) excision    02-10-2013  left cheek  s/p  moh's sx   History of palpitations    History of recurrent UTIs    History of syncope    2015-- felt to vasovagal response to pain (per cardiologist note)   IBS (irritable bowel syndrome)    Open wound of left knee    warm soaks daily /  neosprin and dressing daily / per per still has drainage   Patellar bursitis of left knee    pre-patella septic bursitis w/ foreign body   Right thyroid nodule    x2 right side incidental finding on carotid duplex 03/ 2018-- thyroid ultrasound done , benign , follow-up in one year   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/29/20201   in situ-right upper arm-anterior   Squamous cell carcinoma of skin 11/04/2019   KA-right lower leg-anterior superior   Wears hearing aid in both ears     Past Surgical History:  Procedure Laterality Date   APPENDECTOMY  age 58   BREAST BIOPSY  1962   benign   CATARACT EXTRACTION W/PHACO  06/25/2011   Procedure: CATARACT EXTRACTION PHACO AND INTRAOCULAR LENS PLACEMENT ([place]);  Surgeon: Williams Che, MD;  Location: [facility];  Service: Ophthalmology;  Laterality: Right;  CDE: 8.90   CATARACT EXTRACTION W/PHACO Left 05/05/2012   Procedure: CATARACT EXTRACTION PHACO AND INTRAOCULAR LENS  PLACEMENT ([facility]);  Surgeon: Williams Che, MD;  Location: [facility];  Service: Ophthalmology;  Laterality: Left;  CDE 12.78   COLONOSCOPY  last one 11-26-2012   EXCISION MORTON'S NEUROMA Right 2003  approx.   INCISION AND DRAINAGE WOUND WITH FOREIGN BODY REMOVAL Left 08/30/2016   Procedure: LEFT KNEE INCISION AND DRAINAGE WOUND WITH FOREIGN BODY REMOVAL;  Surgeon: Sydnee Cabal, MD;  Location: [place];  Service: Orthopedics;  Laterality: Left;   KNEE ARTHROPLASTY Right 03/14/2017   Procedure: RIGHT TOTAL KNEE ARTHROPLASTY WITH COMPUTER NAVIGATION AND REMOVAL OF HARDWARE;  Surgeon: Rod Can, MD;  Location[facility];  Service: Orthopedics;  Laterality: Right;  Adductor Block   KNEE ARTHROSCOPY W/ ACL RECONSTRUCTION Right 1996   KNEE CARTILAGE SURGERY Right age 52   LUMBAR FUSION  08/28/2020   MOHS SURGERY  02/10/2013   left cheek -- [facility]   POSTERIOR REPAIR  05-21-2002   dr Glo Herring   symptomatic recetocele   TONSILLECTOMY AND ADENOIDECTOMY  age 74   TRANSTHORACIC ECHOCARDIOGRAM  04-17-2016  dr Domenic Polite   ef 60-65%/  trivial MR/ mild TR   VAGINAL HYSTERECTOMY  1979   w/  Bilateral Salpingoophorectomy    Family History: Family History  Problem Relation Age of Onset   Hypertension Mother  CVA Mother    Throat cancer Father    Pseudochol deficiency Neg Hx    Malignant hyperthermia Neg Hx    Hypotension Neg Hx    Anesthesia problems Neg Hx    Colon cancer Neg Hx    Esophageal cancer Neg Hx     Social History:   reports that she quit smoking about 60 years ago. Her smoking use included cigarettes. She has a 5.00 pack-year smoking history. She has never used smokeless tobacco. She reports current alcohol use of about 14.0 standard drinks of alcohol per week. She reports that she does not use drugs.  Medications: Medications Prior to Admission  Medication Sig Dispense Refill   aspirin EC 81 MG tablet Take 1 tablet (81 mg total) by mouth daily. Swallow whole. 90  tablet 3   atorvastatin (LIPITOR) 40 MG tablet TAKE 1 TABLET ONCE DAILY. 90 tablet 0   Cholecalciferol (VITAMIN D) 50 MCG (2000 UT) tablet Take 1 tablet by mouth daily.     Flaxseed, Linseed, (FLAX PO) Take 1 Dose by mouth every morning.      losartan (COZAAR) 100 MG tablet TAKE 1 TABLET BY MOUTH EVERY MORNING. 90 tablet 0   metoprolol tartrate (LOPRESSOR) 50 MG tablet TAKE (1) TABLET TWICE DAILY. 180 tablet 0   Multiple Vitamins-Minerals (CENTRUM SILVER) tablet Take 1 tablet by mouth daily.     OMEGA 3 1000 MG CAPS Take 1,000 mg by mouth at bedtime.      OVER THE COUNTER MEDICATION Calcium '600mg'$  one daily Vitamin D 2,000 iu daily     Probiotic Product (PROBIOTIC DAILY PO) Take 1 tablet by mouth daily.     Psyllium (METAMUCIL PO) Take 1 Scoop by mouth every evening.      ALPRAZolam (XANAX) 0.25 MG tablet TAKE 1/2 TO 1 TABLET DAILY AS NEEDED FOR ANXIETY 20 tablet 0    No results found for this or any previous visit (from the past 48 hour(s)).  No results found.    Blood pressure (!) 198/98, pulse 61, temperature 98.3 F (36.8 C), temperature source Oral, resp. rate 15, height 5' (1.524 m), weight 52.5 kg, SpO2 99 %.  General appearance: alert, cooperative, and appears stated age Head: Normocephalic, without obvious abnormality, atraumatic Neck: supple, symmetrical, trachea midline Extremities: Intact sensation and capillary refill all digits.  +epl/fpl/io.  No wounds.  Pulses: 2+ and symmetric Skin: Skin color, texture, turgor normal. No rashes or lesions Neurologic: Grossly normal Incision/Wound: none  Assessment/Plan Left ring finger mucoid cyst and dip joint arthritis.  Non operative and operative treatment options have been discussed with the patient and patient wishes to proceed with operative treatment. Risks, benefits, and alternatives of surgery have been discussed and the patient agrees with the plan of care.   Leanora Cover 04/16/2022, 1:22 PM

## 2022-04-16 NOTE — Anesthesia Preprocedure Evaluation (Addendum)
Anesthesia Evaluation    Reviewed: reviewed documented beta blocker date and time   Airway Mallampati: II  TM Distance: >3 FB Neck ROM: Limited    Dental no notable dental hx. (+) Dental Advisory Given   Pulmonary former smoker   Pulmonary exam normal breath sounds clear to auscultation       Cardiovascular hypertension, Pt. on medications and Pt. on home beta blockers Normal cardiovascular exam Rhythm:Regular Rate:Normal  EKG 04/10/22 NSR, Normal  Echo 04/29/20 1. Left ventricular ejection fraction, by estimation, is 70 to 75%. The  left ventricle has hyperdynamic function. The left ventricle has no  regional wall motion abnormalities. Left ventricular diastolic parameters  were normal.   2. Right ventricular systolic function is normal. The right ventricular  size is normal. There is normal pulmonary artery systolic pressure.   3. The mitral valve is normal in structure. Mild mitral valve  regurgitation.   4. The aortic valve is tricuspid. Aortic valve regurgitation is not  visualized. Mild aortic valve sclerosis is present, with no evidence of  aortic valve stenosis.   5. The inferior vena cava is normal in size with greater than 50%  respiratory variability, suggesting right atrial pressure of 3 mmHg.     Neuro/Psych negative neurological ROS  negative psych ROS   GI/Hepatic Neg liver ROS,GERD  Medicated,,  Endo/Other  Hyperlipidemia  Renal/GU Renal diseaseHx/o CKD  negative genitourinary   Musculoskeletal  (+) Arthritis , Osteoarthritis,  Left ring finger mucoid cyst and DIP joint arthritis   Abdominal   Peds  Hematology negative hematology ROS (+)   Anesthesia Other Findings   Reproductive/Obstetrics                              Anesthesia Physical Anesthesia Plan  ASA: 2  Anesthesia Plan: MAC and Regional   Post-op Pain Management: Minimal or no pain anticipated and  Regional block*   Induction: Intravenous  PONV Risk Score and Plan: 3 and Treatment may vary due to age or medical condition, Propofol infusion and Ondansetron  Airway Management Planned: Natural Airway and Simple Face Mask  Additional Equipment: None  Intra-op Plan:   Post-operative Plan:   Informed Consent: I have reviewed the patients History and Physical, chart, labs and discussed the procedure including the risks, benefits and alternatives for the proposed anesthesia with the patient or authorized representative who has indicated his/her understanding and acceptance.     Dental advisory given  Plan Discussed with: CRNA and Anesthesiologist  Anesthesia Plan Comments:          Anesthesia Quick Evaluation

## 2022-04-16 NOTE — Progress Notes (Signed)
Assisted Dr. Royce Macadamia with left, supraclavicular, ultrasound guided block. Side rails up, monitors on throughout procedure. See vital signs in flow sheet. Tolerated Procedure well.

## 2022-04-16 NOTE — Anesthesia Postprocedure Evaluation (Signed)
Anesthesia Post Note  Patient: [name]  Procedure(s) Performed: LEFT RING FINGER EXCISION MUCOID CYST AND DISTAL INTERPHALANGEAL JOINT DEBRIDEMENT (Left: Ring Finger)     Patient location during evaluation: PACU Anesthesia Type: Regional Level of consciousness: awake and alert and oriented Pain management: pain level controlled Vital Signs Assessment: post-procedure vital signs reviewed and stable Respiratory status: spontaneous breathing, nonlabored ventilation and respiratory function stable Cardiovascular status: blood pressure returned to baseline Postop Assessment: no apparent nausea or vomiting Anesthetic complications: no Comments: Hypertensive in PACU with SBP 190s, consistent with preop values. Patient asymptomatic, denies pain. She plans to take home dose of losartan when she gets home. Daiva Huge, MD   No notable events documented.  Last Vitals:  Vitals:   04/16/22 1503 04/16/22 1515  BP: (!) 199/93 (!) 196/91  Pulse:  (!) 57  Resp: 16 17  Temp: (!) 36.2 C   SpO2: 98% 94%    Last Pain:  Vitals:   04/16/22 1515  TempSrc:   PainSc: 0-No pain                 Marthenia Rolling

## 2022-04-16 NOTE — Op Note (Addendum)
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  Left ring finger excision mucoid cyst 2.  Left ring finger debridement of DIP joint including osteophyte from middle and distal phalanges   SURGEON:  Leanora Cover, M.D.   ASSISTANT: none   ANESTHESIA:  Regional with sedation   INTRAVENOUS FLUIDS:  Per anesthesia flow sheet.   ESTIMATED BLOOD LOSS:  Minimal.   COMPLICATIONS:  None.   SPECIMENS: Mucoid cyst to pathology   TOURNIQUET TIME:    Total Tourniquet Time Documented: Upper Arm (Left) - 17 minutes Total: Upper Arm (Left) - 17 minutes    DISPOSITION:  Stable to PACU.   INDICATIONS: 82 year old female with mucoid cyst of left ring finger and DIP joint arthritis.  It is bothersome to her.  She wishes to have it removed and the DIP joint debrided to try to prevent recurrence.  Risks, benefits and alternatives of surgery were discussed including the risks of blood loss, infection, damage to nerves, vessels, tendons, ligaments, bone for surgery, need for additional surgery, complications with wound healing, continued pain, stiffness, , recurrence.  She voiced understanding of these risks and elected to proceed.  OPERATIVE COURSE:  After being identified preoperatively by myself,  the patient and I agreed on the procedure and site of the procedure.  The surgical site was marked.  Surgical consent had been signed. Preoperative IV antibiotic prophylaxis was given. She was transferred to the operating room and placed on the operating table in supine position with the left upper extremity on an arm board.  Sedation was induced by the  anesthesiologist. A regional block had been performed by anesthesia in preoperative holding.    Left upper extremity was prepped and draped in normal sterile orthopedic fashion.  A surgical pause was performed between the surgeons, anesthesia, and operating room staff and all were in agreement as to the patient, procedure, and site of procedure.  Tourniquet at the proximal aspect of the extremity was inflated to 250 mmHg after exsanguination of the arm with an Esmarch bandage.  A hockey-stick shaped incision was made at the DIP joint of the ring finger.  This was carried in subcutaneous tissues by spreading technique.  The cyst was carefully freed up of surrounding soft tissues.  It was removed with the synovectomy rongeurs.  Was sent to pathology for examination.  The DIP joint was entered underneath the extensor tendon.  The synovectomy rongeurs were used to perform synovectomy and remove some more cyst that was within the joint.  There was osteophyte of the base of the distal phalanx which was taken down.  There was osteophyte at the dorsum of the middle phalanx as well as prominence at both the radial and ulnar sides of the condyles which was taken down.  The wound and joint were copiously irrigated with sterile saline.  The wound was closed with 4-0 nylon in a horizontal mattress fashion.  The wound was dressed with sterile Xeroform 4 x 4 and wrapped with a Coban dressing lightly.  An AlumaFoam splint was placed and wrapped lightly with Coban dressing.  The tourniquet was deflated at 17 minutes.  Fingertips were pink with brisk capillary  refill after deflation of tourniquet.  The operative  drapes were broken down.  The patient was awoken from anesthesia safely.  She was transferred back to the stretcher and taken to PACU in stable condition.  I will see her back in the office in 1 week for postoperative followup.  I will give her a prescription for Norco 5/325 1 tab PO q6 hours prn pain, dispense #  15.   Leanora Cover, MD Electronically signed, 04/16/22

## 2022-04-16 NOTE — Anesthesia Procedure Notes (Signed)
Anesthesia Regional Block: Supraclavicular block   Pre-Anesthetic Checklist: , timeout performed,  Correct Patient, Correct Site, Correct Laterality,  Correct Procedure, Correct Position, site marked,  Risks and benefits discussed,  Surgical consent,  Pre-op evaluation,  At surgeon's request and post-op pain management  Laterality: Left  Prep: chloraprep       Needles:  Injection technique: Single-shot  Needle Type: Echogenic Stimulator Needle     Needle Length: 10cm  Needle Gauge: 21   Needle insertion depth: 6 cm   Additional Needles:   Procedures:,,,, ultrasound used (permanent image in chart),,   Motor weakness within 5 minutes.  Narrative:  Start time: 04/16/2022 12:47 PM End time: 04/16/2022 12:52 PM Injection made incrementally with aspirations every 5 mL.  Performed by: Personally  Anesthesiologist: Josephine Igo, MD  Additional Notes: Timeout performed. Patient sedated. Relevant anatomy ID'd using [place]. Incremental 2-37m injection of LA with frequent aspiration. Patient tolerated procedure well.     Left Supraclavicular

## 2022-04-17 ENCOUNTER — Encounter (HOSPITAL_BASED_OUTPATIENT_CLINIC_OR_DEPARTMENT_OTHER): Payer: Self-pay | Admitting: Orthopedic Surgery

## 2022-04-17 ENCOUNTER — Other Ambulatory Visit: Payer: Self-pay | Admitting: Family Medicine

## 2022-04-17 DIAGNOSIS — I10 Essential (primary) hypertension: Secondary | ICD-10-CM

## 2022-04-17 DIAGNOSIS — I499 Cardiac arrhythmia, unspecified: Secondary | ICD-10-CM

## 2022-04-17 LAB — SURGICAL PATHOLOGY

## 2022-04-30 DIAGNOSIS — M67442 Ganglion, left hand: Secondary | ICD-10-CM | POA: Diagnosis not present

## 2022-05-15 DIAGNOSIS — Z7982 Long term (current) use of aspirin: Secondary | ICD-10-CM | POA: Diagnosis not present

## 2022-05-15 DIAGNOSIS — I10 Essential (primary) hypertension: Secondary | ICD-10-CM | POA: Diagnosis not present

## 2022-05-15 DIAGNOSIS — Z743 Need for continuous supervision: Secondary | ICD-10-CM | POA: Diagnosis not present

## 2022-05-15 DIAGNOSIS — I700 Atherosclerosis of aorta: Secondary | ICD-10-CM | POA: Diagnosis not present

## 2022-05-15 DIAGNOSIS — X501XXA Overexertion from prolonged static or awkward postures, initial encounter: Secondary | ICD-10-CM | POA: Diagnosis not present

## 2022-05-15 DIAGNOSIS — K5730 Diverticulosis of large intestine without perforation or abscess without bleeding: Secondary | ICD-10-CM | POA: Diagnosis not present

## 2022-05-15 DIAGNOSIS — M549 Dorsalgia, unspecified: Secondary | ICD-10-CM | POA: Diagnosis not present

## 2022-05-15 DIAGNOSIS — S39012A Strain of muscle, fascia and tendon of lower back, initial encounter: Secondary | ICD-10-CM | POA: Diagnosis not present

## 2022-05-15 DIAGNOSIS — E7800 Pure hypercholesterolemia, unspecified: Secondary | ICD-10-CM | POA: Diagnosis not present

## 2022-05-15 DIAGNOSIS — Z79899 Other long term (current) drug therapy: Secondary | ICD-10-CM | POA: Diagnosis not present

## 2022-05-15 DIAGNOSIS — M4316 Spondylolisthesis, lumbar region: Secondary | ICD-10-CM | POA: Diagnosis not present

## 2022-05-15 DIAGNOSIS — R6889 Other general symptoms and signs: Secondary | ICD-10-CM | POA: Diagnosis not present

## 2022-05-17 DIAGNOSIS — Z880 Allergy status to penicillin: Secondary | ICD-10-CM | POA: Diagnosis not present

## 2022-05-17 DIAGNOSIS — M545 Low back pain, unspecified: Secondary | ICD-10-CM | POA: Diagnosis not present

## 2022-05-17 DIAGNOSIS — M5416 Radiculopathy, lumbar region: Secondary | ICD-10-CM | POA: Diagnosis not present

## 2022-05-17 DIAGNOSIS — Z7982 Long term (current) use of aspirin: Secondary | ICD-10-CM | POA: Diagnosis not present

## 2022-05-17 DIAGNOSIS — M25552 Pain in left hip: Secondary | ICD-10-CM | POA: Diagnosis not present

## 2022-05-17 DIAGNOSIS — M5136 Other intervertebral disc degeneration, lumbar region: Secondary | ICD-10-CM | POA: Diagnosis not present

## 2022-05-17 DIAGNOSIS — Z981 Arthrodesis status: Secondary | ICD-10-CM | POA: Diagnosis not present

## 2022-05-17 DIAGNOSIS — E7800 Pure hypercholesterolemia, unspecified: Secondary | ICD-10-CM | POA: Diagnosis not present

## 2022-05-17 DIAGNOSIS — Z885 Allergy status to narcotic agent status: Secondary | ICD-10-CM | POA: Diagnosis not present

## 2022-05-17 DIAGNOSIS — Z79899 Other long term (current) drug therapy: Secondary | ICD-10-CM | POA: Diagnosis not present

## 2022-05-17 DIAGNOSIS — I10 Essential (primary) hypertension: Secondary | ICD-10-CM | POA: Diagnosis not present

## 2022-05-28 ENCOUNTER — Other Ambulatory Visit (HOSPITAL_COMMUNITY)
Admission: RE | Admit: 2022-05-28 | Discharge: 2022-05-28 | Disposition: A | Discharge status: Home / Self Care | Payer: Medicare Other | Source: Ambulatory Visit | Attending: Family Medicine | Admitting: Family Medicine

## 2022-05-28 DIAGNOSIS — E782 Mixed hyperlipidemia: Secondary | ICD-10-CM | POA: Diagnosis not present

## 2022-05-28 DIAGNOSIS — I10 Essential (primary) hypertension: Secondary | ICD-10-CM | POA: Diagnosis not present

## 2022-05-28 LAB — COMPREHENSIVE METABOLIC PANEL
ALT: 28 U/L (ref 0–44)
AST: 28 U/L (ref 15–41)
Albumin: 4.5 g/dL (ref 3.5–5.0)
Alkaline Phosphatase: 47 U/L (ref 38–126)
Anion gap: 9 (ref 5–15)
BUN: 11 mg/dL (ref 8–23)
CO2: 28 mmol/L (ref 22–32)
Calcium: 9.4 mg/dL (ref 8.9–10.3)
Chloride: 100 mmol/L (ref 98–111)
Creatinine, Ser: 0.68 mg/dL (ref 0.44–1.00)
GFR, Estimated: >60 mL/min (ref >60)
Glucose, Bld: 98 mg/dL (ref 70–99)
Potassium: 4.2 mmol/L (ref 3.5–5.1)
Sodium: 137 mmol/L (ref 135–145)
Total Bilirubin: 0.8 mg/dL (ref 0.3–1.2)
Total Protein: 7.1 g/dL (ref 6.5–8.1)

## 2022-05-28 LAB — LIPID PANEL
Cholesterol: 180 mg/dL (ref 0–200)
HDL: 97 mg/dL (ref >40)
LDL Cholesterol: 74 mg/dL (ref 0–99)
Total CHOL/HDL Ratio: 1.9 RATIO
Triglycerides: 43 mg/dL (ref <150)
VLDL: 9 mg/dL (ref 0–40)

## 2022-05-28 LAB — CBC
HCT: 41.4 % (ref 36.0–46.0)
Hemoglobin: 14 g/dL (ref 12.0–15.0)
MCH: 32.8 pg (ref 26.0–34.0)
MCHC: 33.8 g/dL (ref 30.0–36.0)
MCV: 97 fL (ref 80.0–100.0)
Platelets: 211 10*3/uL (ref 150–400)
RBC: 4.27 MIL/uL (ref 3.87–5.11)
RDW: 12.4 % (ref 11.5–15.5)
WBC: 3.8 10*3/uL — ABNORMAL LOW (ref 4.0–10.5)
nRBC: 0 % (ref 0.0–0.2)

## 2022-05-29 ENCOUNTER — Telehealth: Payer: Self-pay

## 2022-05-29 NOTE — Telephone Encounter (Signed)
Called patient regarding results. Patient had understanding of results. 

## 2022-05-29 NOTE — Progress Notes (Signed)
Cardiology Clinic Note   Patient Name: [name] Date of Encounter: 06/01/2022  Primary Care Provider:  Tommie Sams, DO Primary Cardiologist:  Nona Dell, MD  Patient Profile    82 year old female with known history of HTN, HL, right subclavian steal syndrome carotid artery disease, lumbar stenosis,  IBS, right thyroid nodule. Last seen in the office on 06/02/2021 for annual appointment.   Past Medical History    Past Medical History:  Diagnosis Date   Arthritis    Basal cell carcinoma 04/29/2019   right inner cheek [facility])   Basal cell carcinoma 04/29/2019   left side nose (MOHs)   Chronic kidney disease    Diverticulosis of colon    Essential hypertension    History of basal cell carcinoma (BCC) excision    02-10-2013  left cheek  s/p  moh's sx   History of palpitations    History of recurrent UTIs    History of syncope    2015-- felt to vasovagal response to pain (per cardiologist note)   IBS (irritable bowel syndrome)    Open wound of left knee    warm soaks daily /  neosprin and dressing daily / per per still has drainage   Patellar bursitis of left knee    pre-patella septic bursitis w/ foreign body   Right thyroid nodule    x2 right side incidental finding on carotid duplex 03/ 2018-- thyroid ultrasound done , benign , follow-up in one year   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/29/20201   in situ-right upper arm-anterior   Squamous cell carcinoma of skin 11/04/2019   KA-right lower leg-anterior superior   Wears hearing aid in both ears    Past Surgical History:  Procedure Laterality Date   APPENDECTOMY  age 13   BREAST BIOPSY  1962   benign   CATARACT EXTRACTION W/PHACO  06/25/2011   Procedure: CATARACT EXTRACTION PHACO AND INTRAOCULAR LENS PLACEMENT (IOC);  Surgeon: Susa Simmonds, MD;  Location: [facility];  Service: Ophthalmology;  Laterality: Right;  CDE: 8.90   CATARACT EXTRACTION W/PHACO Left 05/05/2012   Procedure: CATARACT EXTRACTION PHACO  AND INTRAOCULAR LENS PLACEMENT (IOC);  Surgeon: Susa Simmonds, MD;  Location: [facility];  Service: Ophthalmology;  Laterality: Left;  CDE 12.78   COLONOSCOPY  last one 11-26-2012   CYST EXCISION Left 04/16/2022   Procedure: LEFT RING FINGER EXCISION MUCOID CYST AND DISTAL INTERPHALANGEAL JOINT DEBRIDEMENT;  Surgeon: Betha Loa, MD;  Location: [facility];  Service: Orthopedics;  Laterality: Left;   EXCISION MORTON'S NEUROMA Right 2003  approx.   INCISION AND DRAINAGE WOUND WITH FOREIGN BODY REMOVAL Left 08/30/2016   Procedure: LEFT KNEE INCISION AND DRAINAGE WOUND WITH FOREIGN BODY REMOVAL;  Surgeon: Eugenia Mcalpine, MD;  Location: [facility];  Service: Orthopedics;  Laterality: Left;   KNEE ARTHROPLASTY Right 03/14/2017   Procedure: RIGHT TOTAL KNEE ARTHROPLASTY WITH COMPUTER NAVIGATION AND REMOVAL OF HARDWARE;  Surgeon: Samson Frederic, MD;  Location: [facility];  Service: Orthopedics;  Laterality: Right;  Adductor Block   KNEE ARTHROSCOPY W/ ACL RECONSTRUCTION Right 1996   KNEE CARTILAGE SURGERY Right age 3   LUMBAR FUSION  08/28/2020   MOHS SURGERY  02/10/2013   left cheek -- [facility]   POSTERIOR REPAIR  05-21-2002   dr Emelda Fear   symptomatic recetocele   TONSILLECTOMY AND ADENOIDECTOMY  age 76   TRANSTHORACIC ECHOCARDIOGRAM  04-17-2016  dr Diona Browner   ef 60-65%/  trivial MR/ mild TR  VAGINAL HYSTERECTOMY  1979   w/  Bilateral Salpingoophorectomy    Allergies  Allergies  Allergen Reactions   Codeine Other (See Comments)    REACTION: syncope   Penicillins Rash    Can tolerate cephalosporins    History of Present Illness    Mrs. Weatherwax comes today without any new cardiac complaints.  The patient has had recent ED visit due to SI joint and nerve impingement and was found to be extremely hypertensive due to pain.  Once pain was controlled blood pressure did normalize somewhat.  She also has whitecoat syndrome and finds that her blood pressure is usually very  elevated with provider appointments.  Today is no exception.  At home blood pressure is labile based upon records she brings to [place] ranging from blood pressures as high as 153/85 to as low as 116/72 usually lowest after having yoga.  She denies any palpitations, dizziness, or chest discomfort.  Home Medications    Current Outpatient Medications  Medication Sig Dispense Refill   aspirin EC 81 MG tablet Take 1 tablet (81 mg total) by mouth daily. Swallow whole. 90 tablet 3   atorvastatin (LIPITOR) 40 MG tablet TAKE 1 TABLET ONCE DAILY. 90 tablet 0   Cholecalciferol (VITAMIN D) 50 MCG (2000 UT) tablet Take 1 tablet by mouth daily.     Flaxseed, Linseed, (FLAX PO) Take 1 Dose by mouth every morning.      hydrochlorothiazide (MICROZIDE) 12.5 MG capsule Take 1 capsule (12.5 mg total) by mouth daily. 90 capsule 3   losartan (COZAAR) 100 MG tablet TAKE 1 TABLET BY MOUTH EVERY MORNING. 90 tablet 0   metoprolol tartrate (LOPRESSOR) 50 MG tablet TAKE (1) TABLET TWICE DAILY. 180 tablet 0   Multiple Vitamins-Minerals (CENTRUM SILVER) tablet Take 1 tablet by mouth daily.     OMEGA 3 1000 MG CAPS Take 1,000 mg by mouth at bedtime.      OVER THE COUNTER MEDICATION Calcium 600mg  one daily Vitamin D 2,000 iu daily     Probiotic Product (PROBIOTIC DAILY PO) Take 1 tablet by mouth daily.     Psyllium (METAMUCIL PO) Take 1 Scoop by mouth every evening.      No current facility-administered medications for this visit.     Family History    Family History  Problem Relation Age of Onset   Hypertension Mother    CVA Mother    Throat cancer Father    Pseudochol deficiency Neg Hx    Malignant hyperthermia Neg Hx    Hypotension Neg Hx    Anesthesia problems Neg Hx    Colon cancer Neg Hx    Esophageal cancer Neg Hx    She indicated that the status of her mother is unknown. She indicated that the status of her father is unknown. She indicated that the status of her neg hx is unknown.  Social History     Social History   Socioeconomic History   Marital status: Married    Spouse name: Not on file   Number of children: 2   Years of education: Not on file   Highest education level: Not on file  Occupational History   Occupation: retired  Tobacco Use   Smoking status: Former    Packs/day: 0.50    Years: 10.00    Additional pack years: 0.00    Total pack years: 5.00    Types: Cigarettes    Quit date: 04/30/1962    Years since quitting: 60.1   Smokeless tobacco:  Never  Vaping Use   Vaping Use: Never used  Substance and Sexual Activity   Alcohol use: Yes    Alcohol/week: 14.0 standard drinks of alcohol    Types: 14 Glasses of wine per week    Comment: 2 wine daily   Drug use: No   Sexual activity: Yes    Birth control/protection: Surgical  Other Topics Concern   Not on file  Social History Narrative   Not on file   Social Determinants of Health   Financial Resource Strain: Not on file  Food Insecurity: Not on file  Transportation Needs: Not on file  Physical Activity: Not on file  Stress: Not on file  Social Connections: Not on file  Intimate Partner Violence: Not on file     Review of Systems    General:  No chills, fever, night sweats or weight changes.  Cardiovascular:  No chest pain, dyspnea on exertion, edema, orthopnea, palpitations, paroxysmal nocturnal dyspnea. Dermatological: No rash, lesions/masses Respiratory: No cough, dyspnea Urologic: No hematuria, dysuria Abdominal:   No nausea, vomiting, diarrhea, bright red blood per rectum, melena, or hematemesis Neurologic:  No visual changes, wkns, changes in mental status. All other systems reviewed and are otherwise negative except as noted above.     Physical Exam    VS:  BP (!) 160/120   Pulse 65   Ht 5' (1.524 m)   Wt 118 lb (53.5 kg)   LMP  (LMP Unknown)   SpO2 97%   BMI 23.05 kg/m  , BMI Body mass index is 23.05 kg/m.     GEN: Well nourished, well developed, in no acute distress. HEENT:  normal. Neck: Supple, no JVD, carotid bruits, or masses. Cardiac: RRR, no murmurs, rubs, or gallops. No clubbing, cyanosis, edema.  Radials/DP/PT 2+ and equal bilaterally.  Respiratory:  Respirations regular and unlabored, clear to auscultation bilaterally. GI: Soft, nontender, nondistended, BS + x 4. MS: no deformity or atrophy. Skin: warm and dry, no rash. Neuro:  Strength and sensation are intact. Psych: Normal affect.  Accessory Clinical Findings    ECG personally reviewed by me today-not completed this office visit  Lab Results  Component Value Date   WBC 3.8 (L) 05/28/2022   HGB 14.0 05/28/2022   HCT 41.4 05/28/2022   MCV 97.0 05/28/2022   PLT 211 05/28/2022   Lab Results  Component Value Date   CREATININE 0.68 05/28/2022   BUN 11 05/28/2022   NA 137 05/28/2022   K 4.2 05/28/2022   CL 100 05/28/2022   CO2 28 05/28/2022   Lab Results  Component Value Date   ALT 28 05/28/2022   AST 28 05/28/2022   ALKPHOS 47 05/28/2022   BILITOT 0.8 05/28/2022   Lab Results  Component Value Date   CHOL 180 05/28/2022   HDL 97 05/28/2022   LDLCALC 74 05/28/2022   TRIG 43 05/28/2022   CHOLHDL 1.9 05/28/2022    No results found for: "HGBA1C"  Review of Prior Studies: Renal Artery Ultrasound 06/02/20 Summary:  Largest Aortic Diameter: 2.3 cm     Renal:     Right: Normal size right kidney. 1-59% stenosis of the right renal         artery. RRV flow present. Normal cortical thickness of right         kidney. Abnormal right Resistive Index.  Left:  Normal size of left kidney. Abnormal left Resistive Index. No         evidence of left renal artery stenosis.  LRV flow present.         Normal cortical thickness of the left kidney.  Mesenteric:  Normal Superior Mesenteric artery findings. 70 to 99% stenosis in the  celiac  artery.     Patent IVC.    Echocardiogram 04/29/2020 1. Left ventricular ejection fraction, by estimation, is 70 to 75%. The  left ventricle has  hyperdynamic function. The left ventricle has no  regional wall motion abnormalities. Left ventricular diastolic parameters  were normal.   2. Right ventricular systolic function is normal. The right ventricular  size is normal. There is normal pulmonary artery systolic pressure.   3. The mitral valve is normal in structure. Mild mitral valve  regurgitation.   4. The aortic valve is tricuspid. Aortic valve regurgitation is not  visualized. Mild aortic valve sclerosis is present, with no evidence of  aortic valve stenosis.   5. The inferior vena cava is normal in size with greater than 50%  respiratory variability, suggesting right atrial pressure of 3 mmHg.   Comparison(s): The left ventricular function is unchanged.    Carotid Ultrasound: 04/18/2020 Summary:  Right Carotid: There is no evidence of stenosis in the right ICA. The                 extracranial vessels were near-normal with only minimal  wall                 thickening or plaque. Decrease velocities in the RICA  compared to                 prior exam.   Left Carotid: Velocities in the left ICA are consistent with a 1-39%  stenosis.                Decrease velocities in the LICA compared to prior exam.   Vertebrals:  Bilateral vertebral arteries demonstrate antegrade flow.  Subclavians: Normal flow hemodynamics were seen in the left subclavian  artery.               Monophasic flow noted in the right subclavian artery and  right innominate suggesting a high grade stenosis more proximally.   Assessment & Plan   1.  Hypertension: Significantly elevated during provider office appointments, with labile blood pressure at home highest being in the 150s over 90s, lowest usually after yoga.  She states that her blood pressures are normally running fairly high at home lately.  I have advised her to try low-dose HCTZ 12.5 mg daily due to elevated diastolic pressures.  I have reviewed her labs and creatinine is 0.68.  She will  follow-up with Audley Hose,, in the [place] office for ongoing management if she wishes to transfer to that office.  Follow-up blood pressure should be checked at home and in office visit.  I want her to be seen in about 3 weeks.  If she begins to feel dizzy, lightheaded, or blood pressure becomes very low she is not to take the HCTZ at all.  She verbalizes understanding.  2.  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  She was not found to have any renal artery stenosis on renal artery ultrasound.  Continue secondary management with statin therapy.  3.  Hypercholesterolemia: I reviewed her labs with her on most recent levels.  Cholesterol status is very well-controlled.  Continue on atorvastatin 40 mg daily.  LFTs were within normal limits.  Signed, Bettey Mare. Liborio Nixon, ANP, AACC   06/01/2022 12:36 PM      Office [phone] Fax [phone]  Notice: This dictation was prepared with Dragon dictation along with smaller phrase technology. Any transcriptional errors that result from this process are unintentional and may not be corrected upon review.

## 2022-06-01 ENCOUNTER — Encounter: Payer: Self-pay | Admitting: Adult Health

## 2022-06-01 ENCOUNTER — Ambulatory Visit: Payer: Medicare Other | Attending: Adult Health | Admitting: Adult Health

## 2022-06-01 VITALS — BP 160/120 | HR 65 | Ht 60.0 in | Wt 118.0 lb

## 2022-06-01 DIAGNOSIS — G458 Other transient cerebral ischemic attacks and related syndromes: Secondary | ICD-10-CM

## 2022-06-01 DIAGNOSIS — E7800 Pure hypercholesterolemia, unspecified: Secondary | ICD-10-CM | POA: Diagnosis not present

## 2022-06-01 DIAGNOSIS — I10 Essential (primary) hypertension: Secondary | ICD-10-CM

## 2022-06-01 MED ORDER — HYDROCHLOROTHIAZIDE 12.5 MG PO CAPS: 12.5000 mg | ORAL_CAPSULE | Freq: Every day | ORAL | 3 refills | Status: DC

## 2022-06-01 NOTE — Patient Instructions (Signed)
Medication Instructions:  Start HCTZ 12.5 mg ( Take 1 Tablet Daily). *If you need a refill on your cardiac medications before your next appointment, please call your pharmacy*   Lab Work: No Labs If you have labs (blood work) drawn today and your tests are completely normal, you will receive your results only by: MyChart Message (if you have MyChart) OR A paper copy in the mail If you have any lab test that is abnormal or we need to change your treatment, we will call you to review the results.   Testing/Procedures: No Testing   Follow-Up: At [facility], you and your health needs are our priority.  As part of our continuing mission to provide you with exceptional heart care, we have created designated Provider Care Teams.  These Care Teams include your primary Cardiologist (physician) and Advanced Practice Providers (APPs -  Physician Assistants and Nurse Practitioners) who all work together to provide you with the care you need, when you need it.  We recommend signing up for the patient portal called "MyChart".  Sign up information is provided on this After Visit Summary.  MyChart is used to connect with patients for Virtual Visits (Telemedicine).  Patients are able to view lab/test results, encounter notes, upcoming appointments, etc.  Non-urgent messages can be sent to your provider as well.   To learn more about what you can do with MyChart, go to [email or web address].    Your next appointment:   3 week(s)  Provider:   Sharlene Dory, NP

## 2022-06-04 ENCOUNTER — Emergency Department (HOSPITAL_COMMUNITY): Payer: Medicare Other

## 2022-06-04 ENCOUNTER — Encounter (HOSPITAL_COMMUNITY): Payer: Self-pay | Admitting: Emergency Medicine

## 2022-06-04 ENCOUNTER — Emergency Department (HOSPITAL_COMMUNITY)
Admission: EM | Admit: 2022-06-04 | Discharge: 2022-06-04 | Disposition: A | Discharge status: Home / Self Care | Payer: Medicare Other | Attending: Emergency Medicine | Admitting: Emergency Medicine

## 2022-06-04 ENCOUNTER — Other Ambulatory Visit: Payer: Self-pay

## 2022-06-04 DIAGNOSIS — W010XXA Fall on same level from slipping, tripping and stumbling without subsequent striking against object, initial encounter: Secondary | ICD-10-CM | POA: Insufficient documentation

## 2022-06-04 DIAGNOSIS — W19XXXA Unspecified fall, initial encounter: Secondary | ICD-10-CM

## 2022-06-04 DIAGNOSIS — Z85828 Personal history of other malignant neoplasm of skin: Secondary | ICD-10-CM | POA: Diagnosis not present

## 2022-06-04 DIAGNOSIS — R6889 Other general symptoms and signs: Secondary | ICD-10-CM | POA: Diagnosis not present

## 2022-06-04 DIAGNOSIS — Z87891 Personal history of nicotine dependence: Secondary | ICD-10-CM | POA: Diagnosis not present

## 2022-06-04 DIAGNOSIS — S0990XA Unspecified injury of head, initial encounter: Secondary | ICD-10-CM | POA: Insufficient documentation

## 2022-06-04 DIAGNOSIS — N189 Chronic kidney disease, unspecified: Secondary | ICD-10-CM | POA: Diagnosis not present

## 2022-06-04 DIAGNOSIS — M25511 Pain in right shoulder: Secondary | ICD-10-CM | POA: Insufficient documentation

## 2022-06-04 DIAGNOSIS — M542 Cervicalgia: Secondary | ICD-10-CM | POA: Insufficient documentation

## 2022-06-04 DIAGNOSIS — I129 Hypertensive chronic kidney disease with stage 1 through stage 4 chronic kidney disease, or unspecified chronic kidney disease: Secondary | ICD-10-CM | POA: Insufficient documentation

## 2022-06-04 DIAGNOSIS — S129XXA Fracture of neck, unspecified, initial encounter: Secondary | ICD-10-CM | POA: Diagnosis not present

## 2022-06-04 DIAGNOSIS — S299XXA Unspecified injury of thorax, initial encounter: Secondary | ICD-10-CM | POA: Diagnosis not present

## 2022-06-04 DIAGNOSIS — Z743 Need for continuous supervision: Secondary | ICD-10-CM | POA: Diagnosis not present

## 2022-06-04 DIAGNOSIS — Z043 Encounter for examination and observation following other accident: Secondary | ICD-10-CM | POA: Diagnosis not present

## 2022-06-04 MED ORDER — ACETAMINOPHEN 500 MG PO TABS
1000.0000 mg | ORAL_TABLET | Freq: Once | ORAL | Status: AC
  Administered 2022-06-04: 1000 mg via ORAL
  Filled 2022-06-04: qty 2

## 2022-06-04 MED ORDER — METHOCARBAMOL 500 MG PO TABS
1000.0000 mg | ORAL_TABLET | Freq: Once | ORAL | Status: AC
  Administered 2022-06-04: 1000 mg via ORAL
  Filled 2022-06-04: qty 2

## 2022-06-04 MED ORDER — METHOCARBAMOL 500 MG PO TABS: 500.0000 mg | ORAL_TABLET | Freq: Three times a day (TID) | ORAL | 0 refills | Status: DC | PRN

## 2022-06-04 MED ORDER — LIDOCAINE 5 % EX PTCH: 1.0000 | MEDICATED_PATCH | CUTANEOUS | 0 refills | Status: DC

## 2022-06-04 NOTE — ED Notes (Signed)
Patient transported to CT 

## 2022-06-04 NOTE — ED Provider Notes (Signed)
Care of patient assumed from Dr. Pilar Plate.  This patient had a mechanical fall last night, during which she struck her head on a piece of furniture and felt a pop in her neck.  She has undergone CT imaging studies which were negative.  She has been hesitant to move.  She will require trial of ambulation and discharge. Physical Exam  BP (!) 188/92 (BP Location: Left Arm)   Pulse 74   Temp 98 F (36.7 C) (Oral)   Resp 18   Ht 5' (1.524 m)   Wt 54 kg   LMP  (LMP Unknown)   SpO2 100%   BMI 23.25 kg/m   Physical Exam Vitals and nursing note reviewed.  Constitutional:      General: She is not in acute distress.    Appearance: Normal appearance. She is well-developed. She is not ill-appearing, toxic-appearing or diaphoretic.  HENT:     Head: Normocephalic and atraumatic.     Right Ear: External ear normal.     Left Ear: External ear normal.     Nose: Nose normal.     Mouth/Throat:     Mouth: Mucous membranes are moist.  Eyes:     Extraocular Movements: Extraocular movements intact.     Conjunctiva/sclera: Conjunctivae normal.  Cardiovascular:     Rate and Rhythm: Normal rate and regular rhythm.  Pulmonary:     Effort: Pulmonary effort is normal. No respiratory distress.  Abdominal:     General: There is no distension.     Palpations: Abdomen is soft.  Musculoskeletal:        General: Normal range of motion.     Cervical back: Neck supple.  Skin:    General: Skin is warm and dry.     Coloration: Skin is not jaundiced or pale.  Neurological:     General: No focal deficit present.     Mental Status: She is alert and oriented to person, place, and time.  Psychiatric:        Mood and Affect: Mood normal.        Behavior: Behavior normal.     Procedures  Procedures  ED Course / MDM    Medical Decision Making Amount and/or Complexity of Data Reviewed Radiology: ordered.  Risk OTC drugs. Prescription drug management.   On assessment, patient resting supine on ED  stretcher.  She has not yet attempted to walk.  She is aware that her imaging studies were negative.  She was offered soft cervical collar to wear for comfort.  She is agreeable to this.  [place] collar was provided.  Patient was able to ambulate without difficulty.  She was discharged in stable condition.       Gloris Manchester, MD 06/04/22 1006

## 2022-06-04 NOTE — ED Provider Notes (Signed)
[facility] DE[facility] Emergency Department Provider Note MRN:  [id number]  Arrival date & time: 06/04/22     Chief Complaint   Fall   History of Present Illness   [name] is a 82 y.o. year-old female with a history of chronic kidney disease presenting to the ED with chief complaint of fall.  Explains that she takes a diuretic and had to get out of bed quickly to go use the bathroom, tripped and fell.  Felt like she cracked her neck when she fell.  Hit her head on a dresser.  No loss of consciousness.  Endorsing pain to the neck, right shoulder.  Review of Systems  A thorough review of systems was obtained and all systems are negative except as noted in the HPI and PMH.   Patient's Health History    Past Medical History:  Diagnosis Date   Arthritis    Basal cell carcinoma 04/29/2019   right inner cheek [facility])   Basal cell carcinoma 04/29/2019   left side nose (MOHs)   Chronic kidney disease    Diverticulosis of colon    Essential hypertension    History of basal cell carcinoma (BCC) excision    02-10-2013  left cheek  s/p  moh's sx   History of palpitations    History of recurrent UTIs    History of syncope    2015-- felt to vasovagal response to pain (per cardiologist note)   IBS (irritable bowel syndrome)    Open wound of left knee    warm soaks daily /  neosprin and dressing daily / per per still has drainage   Patellar bursitis of left knee    pre-patella septic bursitis w/ foreign body   Right thyroid nodule    x2 right side incidental finding on carotid duplex 03/ 2018-- thyroid ultrasound done , benign , follow-up in one year   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/29/20201   in situ-right upper arm-anterior   Squamous cell carcinoma of skin 11/04/2019   KA-right lower leg-anterior superior   Wears hearing aid in both ears     Past Surgical History:  Procedure Laterality Date   APPENDECTOMY  age 89   BREAST BIOPSY  1962   benign    CATARACT EXTRACTION W/PHACO  06/25/2011   Procedure: CATARACT EXTRACTION PHACO AND INTRAOCULAR LENS PLACEMENT (IOC);  Surgeon: Susa Simmonds, MD;  Location: [facility];  Service: Ophthalmology;  Laterality: Right;  CDE: 8.90   CATARACT EXTRACTION W/PHACO Left 05/05/2012   Procedure: CATARACT EXTRACTION PHACO AND INTRAOCULAR LENS PLACEMENT (IOC);  Surgeon: Susa Simmonds, MD;  Location: [facility];  Service: Ophthalmology;  Laterality: Left;  CDE 12.78   COLONOSCOPY  last one 11-26-2012   CYST EXCISION Left 04/16/2022   Procedure: LEFT RING FINGER EXCISION MUCOID CYST AND DISTAL INTERPHALANGEAL JOINT DEBRIDEMENT;  Surgeon: Betha Loa, MD;  Location: [facility];  Service: Orthopedics;  Laterality: Left;   EXCISION MORTON'S NEUROMA Right 2003  approx.   INCISION AND DRAINAGE WOUND WITH FOREIGN BODY REMOVAL Left 08/30/2016   Procedure: LEFT KNEE INCISION AND DRAINAGE WOUND WITH FOREIGN BODY REMOVAL;  Surgeon: Eugenia Mcalpine, MD;  Location: [facility] ;  Service: Orthopedics;  Laterality: Left;   KNEE ARTHROPLASTY Right 03/14/2017   Procedure: RIGHT TOTAL KNEE ARTHROPLASTY WITH COMPUTER NAVIGATION AND REMOVAL OF HARDWARE;  Surgeon: Samson Frederic, MD;  Location: [facility];  Service: Orthopedics;  Laterality: Right;  Adductor Block   KNEE ARTHROSCOPY W/ ACL  RECONSTRUCTION Right 1996   KNEE CARTILAGE SURGERY Right age 64   LUMBAR FUSION  08/28/2020   MOHS SURGERY  02/10/2013   left cheek -- [facility]   POSTERIOR REPAIR  05-21-2002   dr Emelda Fear   symptomatic recetocele   TONSILLECTOMY AND ADENOIDECTOMY  age 66   TRANSTHORACIC ECHOCARDIOGRAM  04-17-2016  dr Diona Browner   ef 60-65%/  trivial MR/ mild TR   VAGINAL HYSTERECTOMY  1979   w/  Bilateral Salpingoophorectomy    Family History  Problem Relation Age of Onset   Hypertension Mother    CVA Mother    Throat cancer Father    Pseudochol deficiency Neg Hx    Malignant hyperthermia Neg Hx    Hypotension Neg Hx    Anesthesia  problems Neg Hx    Colon cancer Neg Hx    Esophageal cancer Neg Hx     Social History   Socioeconomic History   Marital status: Married    Spouse name: Not on file   Number of children: 2   Years of education: Not on file   Highest education level: Not on file  Occupational History   Occupation: retired  Tobacco Use   Smoking status: Former    Packs/day: 0.50    Years: 10.00    Additional pack years: 0.00    Total pack years: 5.00    Types: Cigarettes    Quit date: 04/30/1962    Years since quitting: 60.1   Smokeless tobacco: Never  Vaping Use   Vaping Use: Never used  Substance and Sexual Activity   Alcohol use: Yes    Alcohol/week: 14.0 standard drinks of alcohol    Types: 14 Glasses of wine per week    Comment: 2 wine daily   Drug use: No   Sexual activity: Yes    Birth control/protection: Surgical  Other Topics Concern   Not on file  Social History Narrative   Not on file   Social Determinants of Health   Financial Resource Strain: Not on file  Food Insecurity: Not on file  Transportation Needs: Not on file  Physical Activity: Not on file  Stress: Not on file  Social Connections: Not on file  Intimate Partner Violence: Not on file     Physical Exam   Vitals:   06/04/22 0605  BP: (!) 188/92  Pulse: 74  Resp: 18  Temp: 98 F (36.7 C)  SpO2: 100%    CONSTITUTIONAL: Well-appearing, NAD NEURO/PSYCH:  Alert and oriented x 3, no focal deficits EYES:  eyes equal and reactive ENT/NECK:  no LAD, no JVD CARDIO: Regular rate, well-perfused, normal S1 and S2 PULM:  CTAB no wheezing or rhonchi GI/GU:  non-distended, non-tender MSK/SPINE:  No gross deformities, no edema SKIN:  no rash, atraumatic   *Additional and/or pertinent findings included in MDM below  Diagnostic and Interventional Summary    EKG Interpretation  Date/Time:    Ventricular Rate:    PR Interval:    QRS Duration:   QT Interval:    QTC Calculation:   R Axis:     Text  Interpretation:         Labs Reviewed - No data to display  CT HEAD WO CONTRAST ( )  Final Result    CT CERVICAL SPINE WO CONTRAST  Final Result    CT Thoracic Spine Wo Contrast  Final Result    DG Shoulder Right  Final Result    DG Chest Port 1 View  Final Result  Medications  methocarbamol (ROBAXIN) tablet 1,000 mg (has no administration in time range)  acetaminophen (TYLENOL) tablet 1,000 mg (has no administration in time range)     Procedures  /  Critical Care Procedures  ED Course and Medical Decision Making  Initial Impression and Ddx Moving all extremities, does not seem to have any neurological deficits but given the history there is concern for possible cervical spinal fracture.  Awaiting imaging.  Past medical/surgical history that increases complexity of ED encounter: None  Interpretation of Diagnostics I personally reviewed the Chest Xray and my interpretation is as follows: No pneumothorax  No signs of fracture to the shoulder, spine.  Patient Reassessment and Ultimate Disposition/Management     Patient still very hesitant to move, suspect muscle strain or spasm.  Providing muscle relaxer, anti-inflammatory, will need reassessment, will need to make sure patient is able to somewhat move/ambulate prior to discharge.  Signed out to oncoming provider.  Patient management required discussion with the following services or consulting groups:  None  Complexity of Problems Addressed Acute illness or injury that poses threat of life of bodily function  Additional Data Reviewed and Analyzed Further history obtained from: Further history from spouse/family member  Additional Factors Impacting ED Encounter Risk None  Elmer Sow. Pilar Plate, MD [facility] Emergency Medicine [facility] [email or web address]  Final Clinical Impressions(s) / ED Diagnoses     ICD-10-CM   1. Fall, initial encounter  W19.XXXA     2. Neck pain  M54.2        ED Discharge Orders     None        Discharge Instructions Discussed with and Provided to Patient:   Discharge Instructions   None      Sabas Sous, MD 06/04/22 [phone]

## 2022-06-04 NOTE — ED Triage Notes (Signed)
Pt from home via EMS after she got up to go to bathroom and tripped over her feet and hit her head on her dresser. Pt states she felt a pop in her neck. C/O pain to cervical area and R shoulder area. Pt has c-collar in place.

## 2022-06-04 NOTE — Discharge Instructions (Addendum)
Your CT scans did not show any fractures or bleeding.  Collar is to be worn as needed for comfort only.  Take ibuprofen and Tylenol as needed for pain and soreness.  A prescription for muscle relaxer was sent to your pharmacy as well to take as needed.  Return to the emergency department for any new or worsening symptoms of concern.

## 2022-06-06 ENCOUNTER — Ambulatory Visit
Admission: RE | Admit: 2022-06-06 | Discharge: 2022-06-06 | Disposition: A | Discharge status: Home / Self Care | Payer: Medicare Other | Source: Ambulatory Visit | Attending: Family Medicine | Admitting: Family Medicine

## 2022-06-06 ENCOUNTER — Other Ambulatory Visit: Payer: Self-pay

## 2022-06-06 VITALS — BP 169/112 | HR 99 | Temp 98.7°F | Resp 20

## 2022-06-06 DIAGNOSIS — R490 Dysphonia: Secondary | ICD-10-CM

## 2022-06-06 NOTE — Discharge Instructions (Addendum)
As we discussed, I suspect the hoarseness is coming from post nasal drainage.  You can start short term Flonase to see if it helps  Follow up with [place] if symptoms persist or worsen despite treatment

## 2022-06-06 NOTE — ED Triage Notes (Signed)
Pt reports seen for Fall in ED on Monday. Pt reports continued right arm pain and reports is also concerned about progressive hoarseness and scratchy throat.

## 2022-06-06 NOTE — ED Provider Notes (Signed)
[facility]    CSN: [id number] Arrival date & time: 06/06/22  1317      History   Chief Complaint Chief Complaint  Patient presents with   Fall    Seen in [facility] ER early Monday April 29. I am still having a lot of pain in my R upper arm andI have a very tender and scratchy throat. This fall was like a whiplash injury. No fever. - Entered by patient    HPI [name] is a 82 y.o. female.   Patient presents today with concern of hoarseness and sore throat for the past 2 days.  Reports symptoms began after she fell, hit her head against a wooden dresser, and her neck.  She was initially seen in the emergency room where she had multiple x-rays, CT scans that did not show any acute fractures.  Reports the sore throat began after she returned home and took a nap.  No fever, body aches or chills.  No significant cough, runny/stuffy nose.  No chest pain or shortness of breath.  Reports she is still having pain in her neck and right upper arm, however this is improving.  She has not noticed much change in the hoarseness and is becoming concerned about possible laryngeal strain.  Patient has tried warm tea with lemon and honey, throat lozenges without much improvement.  She does report history of postnasal drainage for which she takes saline nasal spray and that seems to help.    Past Medical History:  Diagnosis Date   Arthritis    Basal cell carcinoma 04/29/2019   right inner cheek [facility])   Basal cell carcinoma 04/29/2019   left side nose [facility])   Chronic kidney disease    Diverticulosis of colon    Essential hypertension    History of basal cell carcinoma (BCC) excision    02-10-2013  left cheek  s/p  moh's sx   History of palpitations    History of recurrent UTIs    History of syncope    2015-- felt to vasovagal response to pain (per cardiologist note)   IBS (irritable bowel syndrome)    Open wound of left knee    warm soaks daily /  neosprin and dressing daily /  per per still has drainage   Patellar bursitis of left knee    pre-patella septic bursitis w/ foreign body   Right thyroid nodule    x2 right side incidental finding on carotid duplex 03/ 2018-- thyroid ultrasound done , benign , follow-up in one year   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/29/20201   in situ-right upper arm-anterior   Squamous cell carcinoma of skin 11/04/2019   KA-right lower leg-anterior superior   Wears hearing aid in both ears     Patient Active Problem List   Diagnosis Date Noted   GERD (gastroesophageal reflux disease) 09/28/2021   Mixed hyperlipidemia 06/25/2020   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   Essential hypertension 03/25/2020   S/P knee surgery 08/30/2016   History of recurrent UTIs 09/19/2015   Osteopenia 05/21/2015    Past Surgical History:  Procedure Laterality Date   APPENDECTOMY  age 23   BREAST BIOPSY  1962   benign   CATARACT EXTRACTION W/PHACO  06/25/2011   Procedure: CATARACT EXTRACTION PHACO AND INTRAOCULAR LENS PLACEMENT (IOC);  Surgeon: Susa Simmonds, MD;  Location[facility];  Service: Ophthalmology;  Laterality: Right;  CDE: 8.90   CATARACT EXTRACTION W/PHACO Left 05/05/2012   Procedure: CATARACT EXTRACTION  PHACO AND INTRAOCULAR LENS PLACEMENT (IOC);  Surgeon: Susa Simmonds, MD;  Location: [facility];  Service: Ophthalmology;  Laterality: Left;  CDE 12.78   COLONOSCOPY  last one 11-26-2012   CYST EXCISION Left 04/16/2022   Procedure: LEFT RING FINGER EXCISION MUCOID CYST AND DISTAL INTERPHALANGEAL JOINT DEBRIDEMENT;  Surgeon: Betha Loa, MD;  Location: [facility];  Service: Orthopedics;  Laterality: Left;   EXCISION MORTON'S NEUROMA Right 2003  approx.   INCISION AND DRAINAGE WOUND WITH FOREIGN BODY REMOVAL Left 08/30/2016   Procedure: LEFT KNEE INCISION AND DRAINAGE WOUND WITH FOREIGN BODY REMOVAL;  Surgeon: Eugenia Mcalpine, MD;  Location: [facility];  Service: Orthopedics;  Laterality: Left;    KNEE ARTHROPLASTY Right 03/14/2017   Procedure: RIGHT TOTAL KNEE ARTHROPLASTY WITH COMPUTER NAVIGATION AND REMOVAL OF HARDWARE;  Surgeon: Samson Frederic, MD;  Location: [facility];  Service: Orthopedics;  Laterality: Right;  Adductor Block   KNEE ARTHROSCOPY W/ ACL RECONSTRUCTION Right 1996   KNEE CARTILAGE SURGERY Right age 48   LUMBAR FUSION  08/28/2020   MOHS SURGERY  02/10/2013   left cheek -- [facility]   POSTERIOR REPAIR  05-21-2002   dr Emelda Fear   symptomatic recetocele   TONSILLECTOMY AND ADENOIDECTOMY  age 30   TRANSTHORACIC ECHOCARDIOGRAM  04-17-2016  dr Diona Browner   ef 60-65%/  trivial MR/ mild TR   VAGINAL HYSTERECTOMY  1979   w/  Bilateral Salpingoophorectomy    OB History   No obstetric history on file.      Home Medications    Prior to Admission medications   Medication Sig Start Date End Date Taking? Authorizing Provider  aspirin EC 81 MG tablet Take 1 tablet (81 mg total) by mouth daily. Swallow whole. 07/15/20   Jodelle Gross, NP  atorvastatin (LIPITOR) 40 MG tablet TAKE 1 TABLET ONCE DAILY. 03/29/22   Tommie Sams, DO  Cholecalciferol (VITAMIN D) 50 MCG (2000 UT) tablet Take 1 tablet by mouth daily. 07/27/20   [provider]  Flaxseed, Linseed, (FLAX PO) Take 1 Dose by mouth every morning.     [provider]  hydrochlorothiazide (MICROZIDE) 12.5 MG capsule Take 1 capsule (12.5 mg total) by mouth daily. 06/01/22 08/30/22  Jodelle Gross, NP  lidocaine (LIDODERM) 5 % Place 1 patch onto the skin daily. Remove & Discard patch within 12 hours or as directed by MD 06/04/22   Sabas Sous, MD  losartan (COZAAR) 100 MG tablet TAKE 1 TABLET BY MOUTH EVERY MORNING. 03/06/22   Tommie Sams, DO  methocarbamol (ROBAXIN) 500 MG tablet Take 1 tablet (500 mg total) by mouth every 8 (eight) hours as needed for muscle spasms. 06/04/22   Sabas Sous, MD  metoprolol tartrate (LOPRESSOR) 50 MG tablet TAKE (1) TABLET TWICE DAILY. 04/19/22   Tommie Sams, DO   Multiple Vitamins-Minerals (CENTRUM SILVER) tablet Take 1 tablet by mouth daily.    [provider]  OMEGA 3 1000 MG CAPS Take 1,000 mg by mouth at bedtime.     [provider]  OVER THE COUNTER MEDICATION Calcium 600mg  one daily Vitamin D 2,000 iu daily    [provider]  Probiotic Product (PROBIOTIC DAILY PO) Take 1 tablet by mouth daily.    [provider]  Psyllium (METAMUCIL PO) Take 1 Scoop by mouth every evening.     [provider]    Family History Family History  Problem Relation Age of Onset   Hypertension Mother  CVA Mother    Throat cancer Father    Pseudochol deficiency Neg Hx    Malignant hyperthermia Neg Hx    Hypotension Neg Hx    Anesthesia problems Neg Hx    Colon cancer Neg Hx    Esophageal cancer Neg Hx     Social History Social History   Tobacco Use   Smoking status: Former    Packs/day: 0.50    Years: 10.00    Additional pack years: 0.00    Total pack years: 5.00    Types: Cigarettes    Quit date: 04/30/1962    Years since quitting: 60.1   Smokeless tobacco: Never  Vaping Use   Vaping Use: Never used  Substance Use Topics   Alcohol use: Yes    Alcohol/week: 14.0 standard drinks of alcohol    Types: 14 Glasses of wine per week    Comment: 2 wine daily   Drug use: No     Allergies   Codeine and Penicillins   Review of Systems Review of Systems Per HPI  Physical Exam Triage Vital Signs ED Triage Vitals  Enc Vitals Group     BP 06/06/22 1324 (!) 169/112     Pulse Rate 06/06/22 1324 99     Resp 06/06/22 1324 20     Temp 06/06/22 1324 98.7 F (37.1 C)     Temp Source 06/06/22 1324 Oral     SpO2 06/06/22 1324 98 %     Weight --      Height --      Head Circumference --      Peak Flow --      Pain Score 06/06/22 1325 2     Pain Loc --      Pain Edu? --      Excl. in GC? --    No data found.  Updated Vital Signs BP (!) 169/112 (BP Location: Right Arm) Comment: pt reports histroy  of elevated bp and reports "awlays have white coat syndrome".  Pulse 99   Temp 98.7 F (37.1 C) (Oral)   Resp 20   LMP  (LMP Unknown)   SpO2 98%   Visual Acuity Right Eye Distance:   Left Eye Distance:   Bilateral Distance:    Right Eye Near:   Left Eye Near:    Bilateral Near:     Physical Exam Vitals and nursing note reviewed.  Constitutional:      General: She is not in acute distress.    Appearance: Normal appearance. She is not toxic-appearing.  HENT:     Head: Normocephalic and atraumatic.     Comments: Cobblestoning of posterior pharynx    Right Ear: External ear normal.     Left Ear: External ear normal.     Ears:     Comments: Hearing aids bilaterally    Nose: Congestion present. No rhinorrhea.     Mouth/Throat:     Mouth: Mucous membranes are moist.     Pharynx: Oropharynx is clear. No posterior oropharyngeal erythema.  Eyes:     General: No scleral icterus.       Right eye: No discharge.        Left eye: No discharge.     Extraocular Movements: Extraocular movements intact.     Pupils: Pupils are equal, round, and reactive to light.  Pulmonary:     Effort: Pulmonary effort is normal. No respiratory distress.  Musculoskeletal:     Cervical back: Normal range of motion.  Lymphadenopathy:     Cervical: No cervical adenopathy.  Skin:    General: Skin is warm and dry.     Capillary Refill: Capillary refill takes less than 2 seconds.     Coloration: Skin is not jaundiced or pale.     Findings: No erythema or rash.  Neurological:     Mental Status: She is alert and oriented to person, place, and time.  Psychiatric:        Behavior: Behavior is cooperative.      UC Treatments / Results  Labs (all labs ordered are listed, but only abnormal results are displayed) Labs Reviewed - No data to display  EKG   Radiology No results found.  Procedures Procedures (including critical care time)  Medications Ordered in UC Medications - No data to  display  Initial Impression / Assessment and Plan / UC Course  I have reviewed the triage vital signs and the nursing notes.  Pertinent labs & imaging results that were available during my care of the patient were reviewed by me and considered in my medical decision making (see chart for details).   Patient is well-appearing, afebrile, not tachycardic, not tachypneic, oxygenating well on room air.  Patient is hypertensive in triage, reports history of whitecoat syndrome, well-documented by cardiologist.  1. Hoarseness Suspect secondary to postnasal drainage; examination and vital signs are reassuring and I have low suspicion for laryngeal strain Reassurance provided to the patient Offered x-ray imaging of upper airway, however patient declines at this time recommended starting Flonase, follow up if symptoms persist or worsen despite treatment  The patient was given the opportunity to ask questions.  All questions answered to their satisfaction.  The patient is in agreement to this plan.    Final Clinical Impressions(s) / UC Diagnoses   Final diagnoses:  Hoarseness     Discharge Instructions      As we discussed, I suspect the hoarseness is coming from post nasal drainage.  You can start short term Flonase to see if it helps  Follow up with [place] if symptoms persist or worsen despite treatment     ED Prescriptions   None    PDMP not reviewed this encounter.   Valentino Nose, NP 06/06/22 1356

## 2022-06-18 ENCOUNTER — Ambulatory Visit: Payer: Medicare Other | Admitting: Nurse Practitioner

## 2022-06-21 ENCOUNTER — Other Ambulatory Visit: Payer: Self-pay | Admitting: Family Medicine

## 2022-06-21 DIAGNOSIS — I10 Essential (primary) hypertension: Secondary | ICD-10-CM

## 2022-06-22 DIAGNOSIS — M67442 Ganglion, left hand: Secondary | ICD-10-CM | POA: Diagnosis not present

## 2022-06-25 ENCOUNTER — Other Ambulatory Visit: Payer: Self-pay | Admitting: Family Medicine

## 2022-06-25 DIAGNOSIS — I10 Essential (primary) hypertension: Secondary | ICD-10-CM

## 2022-06-25 DIAGNOSIS — I499 Cardiac arrhythmia, unspecified: Secondary | ICD-10-CM

## 2022-06-26 ENCOUNTER — Other Ambulatory Visit (HOSPITAL_COMMUNITY): Payer: Self-pay | Admitting: Orthopaedic Surgery

## 2022-06-26 DIAGNOSIS — M75101 Unspecified rotator cuff tear or rupture of right shoulder, not specified as traumatic: Secondary | ICD-10-CM

## 2022-06-26 DIAGNOSIS — M25511 Pain in right shoulder: Secondary | ICD-10-CM | POA: Diagnosis not present

## 2022-06-27 DIAGNOSIS — M25511 Pain in right shoulder: Secondary | ICD-10-CM | POA: Diagnosis not present

## 2022-06-28 ENCOUNTER — Other Ambulatory Visit: Payer: Self-pay

## 2022-06-28 ENCOUNTER — Emergency Department (HOSPITAL_COMMUNITY): Payer: Medicare Other

## 2022-06-28 ENCOUNTER — Encounter (HOSPITAL_COMMUNITY): Payer: Self-pay | Admitting: Emergency Medicine

## 2022-06-28 ENCOUNTER — Observation Stay (HOSPITAL_COMMUNITY)
Admission: EM | Admit: 2022-06-28 | Discharge: 2022-06-29 | Disposition: A | Discharge status: Home / Self Care | Payer: Medicare Other | Attending: Internal Medicine | Admitting: Internal Medicine

## 2022-06-28 DIAGNOSIS — R2681 Unsteadiness on feet: Secondary | ICD-10-CM | POA: Insufficient documentation

## 2022-06-28 DIAGNOSIS — E042 Nontoxic multinodular goiter: Secondary | ICD-10-CM | POA: Diagnosis not present

## 2022-06-28 DIAGNOSIS — Z79899 Other long term (current) drug therapy: Secondary | ICD-10-CM | POA: Insufficient documentation

## 2022-06-28 DIAGNOSIS — R112 Nausea with vomiting, unspecified: Secondary | ICD-10-CM | POA: Diagnosis present

## 2022-06-28 DIAGNOSIS — Z87891 Personal history of nicotine dependence: Secondary | ICD-10-CM | POA: Insufficient documentation

## 2022-06-28 DIAGNOSIS — Z7982 Long term (current) use of aspirin: Secondary | ICD-10-CM | POA: Insufficient documentation

## 2022-06-28 DIAGNOSIS — E782 Mixed hyperlipidemia: Secondary | ICD-10-CM | POA: Diagnosis present

## 2022-06-28 DIAGNOSIS — E871 Hypo-osmolality and hyponatremia: Principal | ICD-10-CM | POA: Diagnosis present

## 2022-06-28 DIAGNOSIS — R4182 Altered mental status, unspecified: Secondary | ICD-10-CM | POA: Diagnosis not present

## 2022-06-28 DIAGNOSIS — E876 Hypokalemia: Secondary | ICD-10-CM | POA: Diagnosis not present

## 2022-06-28 DIAGNOSIS — Z85828 Personal history of other malignant neoplasm of skin: Secondary | ICD-10-CM | POA: Insufficient documentation

## 2022-06-28 DIAGNOSIS — I10 Essential (primary) hypertension: Secondary | ICD-10-CM | POA: Diagnosis present

## 2022-06-28 DIAGNOSIS — R2689 Other abnormalities of gait and mobility: Secondary | ICD-10-CM | POA: Diagnosis not present

## 2022-06-28 DIAGNOSIS — M6281 Muscle weakness (generalized): Secondary | ICD-10-CM | POA: Insufficient documentation

## 2022-06-28 DIAGNOSIS — M85811 Other specified disorders of bone density and structure, right shoulder: Secondary | ICD-10-CM

## 2022-06-28 DIAGNOSIS — Z9889 Other specified postprocedural states: Secondary | ICD-10-CM

## 2022-06-28 DIAGNOSIS — S0990XA Unspecified injury of head, initial encounter: Secondary | ICD-10-CM | POA: Diagnosis not present

## 2022-06-28 LAB — COMPREHENSIVE METABOLIC PANEL
ALT: 22 U/L (ref 0–44)
AST: 20 U/L (ref 15–41)
Albumin: 4.6 g/dL (ref 3.5–5.0)
Alkaline Phosphatase: 71 U/L (ref 38–126)
Anion gap: 11 (ref 5–15)
BUN: 11 mg/dL (ref 8–23)
CO2: 27 mmol/L (ref 22–32)
Calcium: 9.4 mg/dL (ref 8.9–10.3)
Chloride: 88 mmol/L — ABNORMAL LOW (ref 98–111)
Creatinine, Ser: 0.57 mg/dL (ref 0.44–1.00)
GFR, Estimated: >60 mL/min (ref >60)
Glucose, Bld: 105 mg/dL — ABNORMAL HIGH (ref 70–99)
Potassium: 3.1 mmol/L — ABNORMAL LOW (ref 3.5–5.1)
Sodium: 126 mmol/L — ABNORMAL LOW (ref 135–145)
Total Bilirubin: 1.5 mg/dL — ABNORMAL HIGH (ref 0.3–1.2)
Total Protein: 6.9 g/dL (ref 6.5–8.1)

## 2022-06-28 LAB — LIPASE, BLOOD: Lipase: 33 U/L (ref 11–51)

## 2022-06-28 LAB — TSH: TSH: 1.437 u[IU]/mL (ref 0.350–4.500)

## 2022-06-28 LAB — BASIC METABOLIC PANEL
Anion gap: 13 (ref 5–15)
BUN: 10 mg/dL (ref 8–23)
CO2: 25 mmol/L (ref 22–32)
Calcium: 9.5 mg/dL (ref 8.9–10.3)
Chloride: 91 mmol/L — ABNORMAL LOW (ref 98–111)
Creatinine, Ser: 0.5 mg/dL (ref 0.44–1.00)
GFR, Estimated: >60 mL/min (ref >60)
Glucose, Bld: 92 mg/dL (ref 70–99)
Potassium: 3.4 mmol/L — ABNORMAL LOW (ref 3.5–5.1)
Sodium: 129 mmol/L — ABNORMAL LOW (ref 135–145)

## 2022-06-28 LAB — CBC
HCT: 39.5 % (ref 36.0–46.0)
Hemoglobin: 13.6 g/dL (ref 12.0–15.0)
MCH: 32.5 pg (ref 26.0–34.0)
MCHC: 34.4 g/dL (ref 30.0–36.0)
MCV: 94.5 fL (ref 80.0–100.0)
Platelets: 223 10*3/uL (ref 150–400)
RBC: 4.18 MIL/uL (ref 3.87–5.11)
RDW: 12 % (ref 11.5–15.5)
WBC: 5.9 10*3/uL (ref 4.0–10.5)
nRBC: 0 % (ref 0.0–0.2)

## 2022-06-28 MED ORDER — HYDRALAZINE HCL 20 MG/ML IJ SOLN: 10.0000 mg | Freq: Three times a day (TID) | INTRAMUSCULAR | Status: DC | PRN

## 2022-06-28 MED ORDER — SODIUM CHLORIDE 0.9 % IV SOLN: INTRAVENOUS | Status: DC

## 2022-06-28 MED ORDER — POTASSIUM CHLORIDE CRYS ER 20 MEQ PO TBCR: 40.0000 meq | EXTENDED_RELEASE_TABLET | Freq: Once | ORAL | Status: DC

## 2022-06-28 MED ORDER — SODIUM CHLORIDE 0.9 % IV BOLUS
1000.0000 mL | Freq: Once | INTRAVENOUS | Status: AC
  Administered 2022-06-28: 1000 mL via INTRAVENOUS

## 2022-06-28 MED ORDER — ONDANSETRON HCL 4 MG PO TABS: 4.0000 mg | ORAL_TABLET | Freq: Four times a day (QID) | ORAL | Status: DC | PRN

## 2022-06-28 MED ORDER — HEPARIN SODIUM (PORCINE) 5000 UNIT/ML IJ SOLN
5000.0000 [IU] | Freq: Three times a day (TID) | INTRAMUSCULAR | Status: DC
  Administered 2022-06-28 – 2022-06-29 (×2): 5000 [IU] via SUBCUTANEOUS
  Filled 2022-06-28 (×2): qty 1

## 2022-06-28 MED ORDER — METOPROLOL TARTRATE 50 MG PO TABS
50.0000 mg | ORAL_TABLET | Freq: Two times a day (BID) | ORAL | Status: DC
  Administered 2022-06-28 – 2022-06-29 (×2): 50 mg via ORAL
  Filled 2022-06-28 (×2): qty 1

## 2022-06-28 MED ORDER — ACETAMINOPHEN 325 MG PO TABS: 650.0000 mg | ORAL_TABLET | Freq: Four times a day (QID) | ORAL | Status: DC | PRN

## 2022-06-28 MED ORDER — ATORVASTATIN CALCIUM 40 MG PO TABS
40.0000 mg | ORAL_TABLET | Freq: Every day | ORAL | Status: DC
  Administered 2022-06-28 – 2022-06-29 (×2): 40 mg via ORAL
  Filled 2022-06-28 (×3): qty 1

## 2022-06-28 MED ORDER — POTASSIUM CHLORIDE 10 MEQ/100ML IV SOLN
10.0000 meq | INTRAVENOUS | Status: AC
  Administered 2022-06-28 – 2022-06-29 (×4): 10 meq via INTRAVENOUS
  Filled 2022-06-28 (×4): qty 100

## 2022-06-28 MED ORDER — ACETAMINOPHEN 650 MG RE SUPP: 650.0000 mg | Freq: Four times a day (QID) | RECTAL | Status: DC | PRN

## 2022-06-28 MED ORDER — ONDANSETRON HCL 4 MG/2ML IJ SOLN: 4.0000 mg | Freq: Four times a day (QID) | INTRAMUSCULAR | Status: DC | PRN

## 2022-06-28 MED ORDER — OXYCODONE HCL 5 MG PO TABS: 5.0000 mg | ORAL_TABLET | ORAL | Status: DC | PRN

## 2022-06-28 MED ORDER — ADULT MULTIVITAMIN W/MINERALS CH
1.0000 | ORAL_TABLET | Freq: Every day | ORAL | Status: DC
  Administered 2022-06-29: 1 via ORAL
  Filled 2022-06-28: qty 1

## 2022-06-28 MED ORDER — SODIUM CHLORIDE 0.9 % IV BOLUS
500.0000 mL | Freq: Once | INTRAVENOUS | Status: AC
  Administered 2022-06-28: 500 mL via INTRAVENOUS

## 2022-06-28 MED ORDER — ASPIRIN 81 MG PO TBEC
81.0000 mg | DELAYED_RELEASE_TABLET | Freq: Every day | ORAL | Status: DC
  Administered 2022-06-29: 81 mg via ORAL
  Filled 2022-06-28: qty 1

## 2022-06-28 MED ORDER — IOHEXOL 350 MG/ML SOLN
75.0000 mL | Freq: Once | INTRAVENOUS | Status: AC | PRN
  Administered 2022-06-28: 75 mL via INTRAVENOUS

## 2022-06-28 NOTE — ED Notes (Signed)
ED TO INPATIENT HANDOFF REPORT  ED Nurse Name and Phone #: Jeanella Anton [phone]  S Name/Age/Gender [name] 82 y.o. female Room/Bed: APA06/APA06  Code Status   Code Status: Prior  Home/SNF/Other Home Patient oriented to: self, place, time, and situation Is this baseline? Yes   Triage Complete: Triage complete  Chief Complaint Acute hyponatremia [E87.1]  Triage Note Pt via POV c/o nausea/vomiting and weakness after falling at home 3 weeks ago with + head injury. CT at the time was negative. Pt reports continued altered balance without improvement since her fall, and she is concerned that she may have a head bleed. A/O in triage and ambulatory w/o assistance on arrival.    Allergies Allergies  Allergen Reactions   Codeine Other (See Comments)    REACTION: syncope   Penicillins Rash    Can tolerate cephalosporins    Level of Care/Admitting Diagnosis ED Disposition     ED Disposition  Admit   Condition  --   Comment  Hospital Area: [facility] [[id number]]  Level of Care: Telemetry [5]  Covid Evaluation: Asymptomatic - no recent exposure (last 10 days) testing not required  Diagnosis: Acute hyponatremia [454098]  Admitting Physician: Lilyan Gilford [[id number]]  Attending Physician: Lilyan Gilford [[id number]]          B Medical/Surgery History Past Medical History:  Diagnosis Date   Arthritis    Basal cell carcinoma 04/29/2019   right inner cheek [facility])   Basal cell carcinoma 04/29/2019   left side nose (MOHs)   Chronic kidney disease    Diverticulosis of colon    Essential hypertension    History of basal cell carcinoma (BCC) excision    02-10-2013  left cheek  s/p  moh's sx   History of palpitations    History of recurrent UTIs    History of syncope    2015-- felt to vasovagal response to pain (per cardiologist note)   IBS (irritable bowel syndrome)    Open wound of left knee    warm soaks daily /  neosprin and dressing daily / per  per still has drainage   Patellar bursitis of left knee    pre-patella septic bursitis w/ foreign body   Right thyroid nodule    x2 right side incidental finding on carotid duplex 03/ 2018-- thyroid ultrasound done , benign , follow-up in one year   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/29/20201   in situ-right upper arm-anterior   Squamous cell carcinoma of skin 11/04/2019   KA-right lower leg-anterior superior   Wears hearing aid in both ears    Past Surgical History:  Procedure Laterality Date   APPENDECTOMY  age 30   BREAST BIOPSY  1962   benign   CATARACT EXTRACTION W/PHACO  06/25/2011   Procedure: CATARACT EXTRACTION PHACO AND INTRAOCULAR LENS PLACEMENT (IOC);  Surgeon: Susa Simmonds, MD;  Location: [facility];  Service: Ophthalmology;  Laterality: Right;  CDE: 8.90   CATARACT EXTRACTION W/PHACO Left 05/05/2012   Procedure: CATARACT EXTRACTION PHACO AND INTRAOCULAR LENS PLACEMENT (IOC);  Surgeon: Susa Simmonds, MD;  Location: [facility];  Service: Ophthalmology;  Laterality: Left;  CDE 12.78   COLONOSCOPY  last one 11-26-2012   CYST EXCISION Left 04/16/2022   Procedure: LEFT RING FINGER EXCISION MUCOID CYST AND DISTAL INTERPHALANGEAL JOINT DEBRIDEMENT;  Surgeon: Betha Loa, MD;  Location: [facility];  Service: Orthopedics;  Laterality: Left;   EXCISION MORTON'S NEUROMA Right 2003  approx.   INCISION  AND DRAINAGE WOUND WITH FOREIGN BODY REMOVAL Left 08/30/2016   Procedure: LEFT KNEE INCISION AND DRAINAGE WOUND WITH FOREIGN BODY REMOVAL;  Surgeon: Eugenia Mcalpine, MD;  Location: [facility] ;  Service: Orthopedics;  Laterality: Left;   KNEE ARTHROPLASTY Right 03/14/2017   Procedure: RIGHT TOTAL KNEE ARTHROPLASTY WITH COMPUTER NAVIGATION AND REMOVAL OF HARDWARE;  Surgeon: Samson Frederic, MD;  Location: [facility];  Service: Orthopedics;  Laterality: Right;  Adductor Block   KNEE ARTHROSCOPY W/ ACL RECONSTRUCTION Right 1996   KNEE CARTILAGE SURGERY Right age 56    LUMBAR FUSION  08/28/2020   MOHS SURGERY  02/10/2013   left cheek -- [facility]   POSTERIOR REPAIR  05-21-2002   dr Emelda Fear   symptomatic recetocele   TONSILLECTOMY AND ADENOIDECTOMY  age 49   TRANSTHORACIC ECHOCARDIOGRAM  04-17-2016  dr Diona Browner   ef 60-65%/  trivial MR/ mild TR   VAGINAL HYSTERECTOMY  1979   w/  Bilateral Salpingoophorectomy     A IV Location/Drains/Wounds Patient Lines/Drains/Airways Status     Active Line/Drains/Airways     Name Placement date Placement time Site Days   Peripheral IV 06/28/22 20 G 1" Anterior;Proximal;Right Forearm 06/28/22  1627  Forearm  less than 1   Open Drain 1 Left Knee 8 Fr. 08/30/16  1723  Knee  2128            Intake/Output Last 24 hours No intake or output data in the 24 hours ending 06/28/22 2021  Labs/Imaging Results for orders placed or performed during the hospital encounter of 06/28/22 (from the past 48 hour(s))  Lipase, blood     Status: None   Collection Time: 06/28/22  3:50 PM  Result Value Ref Range   Lipase 33 11 - 51 U/L    Comment: Performed at [facility], [address] [place], [place] [address]  Comprehensive metabolic panel     Status: Abnormal   Collection Time: 06/28/22  3:50 PM  Result Value Ref Range   Sodium 126 (L) 135 - 145 mmol/L   Potassium 3.1 (L) 3.5 - 5.1 mmol/L   Chloride 88 (L) 98 - 111 mmol/L   CO2 27 22 - 32 mmol/L   Glucose, Bld 105 (H) 70 - 99 mg/dL    Comment: Glucose reference range applies only to samples taken after fasting for at least 8 hours.   BUN 11 8 - 23 mg/dL   Creatinine, Ser 1.91 0.44 - 1.00 mg/dL   Calcium 9.4 8.9 - 47.8 mg/dL   Total Protein 6.9 6.5 - 8.1 g/dL   Albumin 4.6 3.5 - 5.0 g/dL   AST 20 15 - 41 U/L   ALT 22 0 - 44 U/L   Alkaline Phosphatase 71 38 - 126 U/L   Total Bilirubin 1.5 (H) 0.3 - 1.2 mg/dL   GFR, Estimated >29 >56 mL/min    Comment: (NOTE) Calculated using the CKD-EPI Creatinine Equation (2021)    Anion gap 11 5 - 15    Comment: Performed at  [facility], [address] [place], [place] [address]  CBC     Status: None   Collection Time: 06/28/22  3:50 PM  Result Value Ref Range   WBC 5.9 4.0 - 10.5 K/uL   RBC 4.18 3.87 - 5.11 MIL/uL   Hemoglobin 13.6 12.0 - 15.0 g/dL   HCT 65.7 84.6 - 96.2 %   MCV 94.5 80.0 - 100.0 fL   MCH 32.5 26.0 - 34.0 pg   MCHC 34.4 30.0 -  36.0 g/dL   RDW 40.9 81.1 - 91.4 %   Platelets 223 150 - 400 K/uL   nRBC 0.0 0.0 - 0.2 %    Comment: Performed at [facility], [address], [place], [place] [address]   CT ANGIO HEAD NECK W WO CM  Result Date: 06/28/2022 CLINICAL DATA:  Head trauma, intracranial arterial injury suspected EXAM: CT ANGIOGRAPHY HEAD AND NECK WITH AND WITHOUT CONTRAST TECHNIQUE: Multidetector CT imaging of the head and neck was performed using the standard protocol during bolus administration of intravenous contrast. Multiplanar CT image reconstructions and MIPs were obtained to evaluate the vascular anatomy. Carotid stenosis measurements (when applicable) are obtained utilizing NASCET criteria, using the distal internal carotid diameter as the denominator. RADIATION DOSE REDUCTION: This exam was performed according to the departmental dose-optimization program which includes automated exposure control, adjustment of the mA and/or kV according to patient size and/or use of iterative reconstruction technique. CONTRAST:  75mL OMNIPAQUE IOHEXOL 350 MG/ML SOLN COMPARISON:  CT head June 04, 2022. FINDINGS: CT HEAD FINDINGS Brain: No evidence of acute infarction, hemorrhage, hydrocephalus, extra-axial collection or mass lesion/mass effect. Similar patchy white matter hypodensities, nonspecific but compatible with chronic microvascular ischemic disease. Vascular: See below. Skull: Normal. Negative for fracture or focal lesion. Sinuses/Orbits: No acute finding. Review of the MIP images confirms the above findings CTA NECK FINDINGS Aortic arch: Great vessel origins are patent. Aortic atherosclerosis.  Right carotid system: No evidence of dissection, stenosis (50% or greater), or occlusion. Left carotid system: Atherosclerosis at the carotid bifurcation without greater than 50% stenosis. No evidence of dissection Vertebral arteries: Left dominant. No evidence of dissection, stenosis (50% or greater), or occlusion. Skeleton: No obvious evidence of acute abnormality on limited assessment. Please see recent CT of the cervical spine from June 04, 2022 for further evaluation. Other neck: Multiple thyroid nodules measuring up to 2.9 cm. Upper chest: Visualized lung apices are clear aside from biapical pleuroparenchymal scarring. Review of the MIP images confirms the above findings CTA HEAD FINDINGS Anterior circulation: Bilateral ICAs, MCAs, and ACAs are patent without proximal hemodynamically significant stenosis. Early right MCA bifurcation, anatomic variant. Posterior circulation: Bilateral intradural vertebral arteries, basilar artery and bilateral posterior cerebral arteries are patent without proximal hemodynamically significant stenosis. Venous sinuses: As permitted by contrast timing, patent. Review of the MIP images confirms the above findings IMPRESSION: 1. No emergent large vessel occlusion or proximal hemodynamically significant stenosis. 2. No evidence of acute arterial injury. 3. Multiple thyroid nodules, measuring up to 2.9 cm. Recommend thyroid US (ref: J Am Coll Radiol. 2015 Feb;12(2): 143-50). Electronically Signed   By: Feliberto Harts M.D.   On: 06/28/2022 17:37    Pending Labs Unresulted Labs (From admission, onward)     Start     Ordered   06/28/22 2011  Basic metabolic panel  Now then every 6 hours,   R (with TIMED occurrences)      06/28/22 2010   06/28/22 2009  TSH  Once,   URGENT        06/28/22 2008   06/28/22 1942  Sodium, urine, random  Once,   URGENT        06/28/22 1941   06/28/22 1941  Osmolality  Once,   URGENT        06/28/22 1940   06/28/22 1941  Osmolality, urine   Once,   URGENT        06/28/22 1940            Vitals/Pain Today's  Vitals   06/28/22 1451 06/28/22 1452 06/28/22 1910 06/28/22 1930  BP:   (!) 188/97 (!) 173/99  Pulse:   73 82  Resp:   18 18  Temp:      TempSrc:      SpO2:   100% 98%  Weight:  51.7 kg    Height:  5' (1.524 m)    PainSc: 2        Isolation Precautions No active isolations  Medications Medications  potassium chloride 10 mEq in 100 mL IVPB (has no administration in time range)  0.9 %  sodium chloride infusion (has no administration in time range)  iohexol (OMNIPAQUE) 350 MG/ML injection 75 mL (75 mLs Intravenous Contrast Given 06/28/22 1701)  sodium chloride 0.9 % bolus 1,000 mL (1,000 mLs Intravenous New Bag/Given 06/28/22 1910)  sodium chloride 0.9 % bolus 500 mL (500 mLs Intravenous New Bag/Given 06/28/22 1946)    Mobility walks     Focused Assessments     R Recommendations: See Admitting Provider Note  Report given to:   Additional Notes:

## 2022-06-28 NOTE — Assessment & Plan Note (Addendum)
Continue statin 

## 2022-06-28 NOTE — Assessment & Plan Note (Addendum)
-  Stable overall with negative orthostatic changes -Resume the use of losartan for now continue holding HCTZ in the setting of hyponatremia -Patient advised to maintain adequate hydration and to follow heart healthy diet -Outpatient follow-up with PCP.

## 2022-06-28 NOTE — Assessment & Plan Note (Addendum)
-  again demonstrated on CT -TSH within normal limits -Outpatient thyroid ultrasound and further evaluation Definitive management recommended.

## 2022-06-28 NOTE — Assessment & Plan Note (Addendum)
-  Described by patient by brain fog and is slow to respond -TSH was normal; CT scan negative for acute intracranial normalities -Patient always oriented x 4 and without any associated focal deficits -Symptoms improved/completely resolved after electrolyte repletion -Most likely mild component of metabolic encephalopathy.

## 2022-06-28 NOTE — Assessment & Plan Note (Addendum)
-  Potassium repleted -Associated with the use of diuretics and decreased oral intake -Resuming the use of losartan at discharge -Repeat basic metabolic panel to follow electrolytes trend.

## 2022-06-28 NOTE — ED Triage Notes (Signed)
Pt via POV c/o nausea/vomiting and weakness after falling at home 3 weeks ago with + head injury. CT at the time was negative. Pt reports continued altered balance without improvement since her fall, and she is concerned that she may have a head bleed. A/O in triage and ambulatory w/o assistance on arrival.

## 2022-06-28 NOTE — Assessment & Plan Note (Addendum)
-  sodium down to 126 at time of admission -Patient reports dizziness, ataxia and increased fatigue -CT scan demonstrating no acute intracranial abnormalities -Symptoms significantly improved/resolved after fluid resuscitation and electrolyte stabilization -Patient has been discharged home in a stable condition -Repeat basic metabolic panel at follow-up visit to assess electrolytes trend/stability.

## 2022-06-28 NOTE — ED Provider Notes (Signed)
[place] EMERGENCY DEPARTMENT AT [facility] Provider Note   CSN: [id number] Arrival date & time: 06/28/22  1434     History  Chief Complaint  Patient presents with   Nausea    [name] is a 82 y.o. female with a history of osteopenia, hypertension, and hyperlipidemia presents to the ED for "brain fog" and feeling unsteady when ambulating.  Patient reports that she was seen here in the ED about 3 weeks ago after a fall but has felt "off" since then.  No intracranial bleeding or acute fractures detected on imaging then but she reports feeling as if she is "not as sharp" mentally, she said that she had a difficult time spelling the other day which is unusual for her. No slurred speech or swallowing. Patient admits to being more lethargic than prior as well as feeling unsteady when she walks with a wider gait.  She denies headache, dizziness, or vision changes. Admits to nausea and one episode of vomiting this morning. Patient wants to ensure she does not have any delayed brain bleed.  No additional complaints or concerns at this time.     Home Medications Prior to Admission medications   Medication Sig Start Date End Date Taking? Authorizing Provider  aspirin EC 81 MG tablet Take 1 tablet (81 mg total) by mouth daily. Swallow whole. 07/15/20   Jodelle Gross, NP  atorvastatin (LIPITOR) 40 MG tablet TAKE 1 TABLET ONCE DAILY. 06/26/22   Tommie Sams, DO  Cholecalciferol (VITAMIN D) 50 MCG (2000 UT) tablet Take 1 tablet by mouth daily. 07/27/20   [provider]  Flaxseed, Linseed, (FLAX PO) Take 1 Dose by mouth every morning.     [provider]  hydrochlorothiazide (MICROZIDE) 12.5 MG capsule Take 1 capsule (12.5 mg total) by mouth daily. 06/01/22 08/30/22  Jodelle Gross, NP  lidocaine (LIDODERM) 5 % Place 1 patch onto the skin daily. Remove & Discard patch within 12 hours or as directed by MD 06/04/22   Sabas Sous, MD  losartan (COZAAR) 100 MG  tablet TAKE 1 TABLET BY MOUTH EVERY MORNING. 06/26/22   Tommie Sams, DO  methocarbamol (ROBAXIN) 500 MG tablet Take 1 tablet (500 mg total) by mouth every 8 (eight) hours as needed for muscle spasms. 06/04/22   Sabas Sous, MD  metoprolol tartrate (LOPRESSOR) 50 MG tablet TAKE (1) TABLET TWICE DAILY. 06/28/22   Tommie Sams, DO  Multiple Vitamins-Minerals (CENTRUM SILVER) tablet Take 1 tablet by mouth daily.    [provider]  OMEGA 3 1000 MG CAPS Take 1,000 mg by mouth at bedtime.     [provider]  OVER THE COUNTER MEDICATION Calcium 600mg  one daily Vitamin D 2,000 iu daily    [provider]  Probiotic Product (PROBIOTIC DAILY PO) Take 1 tablet by mouth daily.    [provider]  Psyllium (METAMUCIL PO) Take 1 Scoop by mouth every evening.     [provider]      Allergies    Codeine and Penicillins    Review of Systems   Review of Systems  Gastrointestinal:  Positive for nausea and vomiting.  Neurological:        "Brain fog," unsteady  All other systems reviewed and are negative.   Physical Exam Updated Vital Signs BP (!) 181/100 (BP Location: Left Arm)   Pulse 74   Temp 99.3 F (37.4 C) (Oral)   Resp 16   Ht 5' (  1.524 m)   Wt 51.7 kg   LMP  (LMP Unknown)   SpO2 99%   BMI 22.26 kg/m  Physical Exam Vitals and nursing note reviewed.  Constitutional:      Appearance: Normal appearance.  HENT:     Head: Normocephalic and atraumatic.     Mouth/Throat:     Mouth: Mucous membranes are moist.  Eyes:     Conjunctiva/sclera: Conjunctivae normal.     Pupils: Pupils are equal, round, and reactive to light.  Cardiovascular:     Rate and Rhythm: Normal rate and regular rhythm.     Pulses: Normal pulses.     Heart sounds: Normal heart sounds.  Pulmonary:     Effort: Pulmonary effort is normal.     Breath sounds: Normal breath sounds.  Abdominal:     Palpations: Abdomen is soft.     Tenderness: There is no abdominal  tenderness.  Musculoskeletal:        General: Tenderness present.     Cervical back: Normal range of motion.     Comments: Tenderness to right shoulder  Skin:    General: Skin is warm and dry.     Findings: No rash.  Neurological:     General: No focal deficit present.     Mental Status: She is alert.  Psychiatric:        Mood and Affect: Mood normal.        Behavior: Behavior normal.     ED Results / Procedures / Treatments   Labs (all labs ordered are listed, but only abnormal results are displayed) Labs Reviewed  LIPASE, BLOOD  COMPREHENSIVE METABOLIC PANEL  CBC    EKG None  Radiology No results found.  Procedures .Critical Care  Performed by: Maxwell Marion, PA-C Authorized by: Maxwell Marion, PA-C   Critical care provider statement:    Critical care time (minutes):  37   Critical care was necessary to treat or prevent imminent or life-threatening deterioration of the following conditions:  Metabolic crisis   Critical care was time spent personally by me on the following activities:  Development of treatment plan with patient or surrogate, discussions with consultants, evaluation of patient's response to treatment, examination of patient, ordering and review of laboratory studies, ordering and review of radiographic studies, ordering and performing treatments and interventions, pulse oximetry, re-evaluation of patient's condition, review of old charts and obtaining history from patient or surrogate   I assumed direction of critical care for this patient from another provider in my specialty: yes     Care discussed with: admitting provider   : not indicated.   Medications Ordered in ED Medications - No data to display  ED Course/ Medical Decision Making/ A&P                             Medical Decision Making Amount and/or Complexity of Data Reviewed Labs: ordered.  This patient presents to the ED for concern of brain bleed s/p fall 3 weeks ago, this involves  an extensive number of treatment options, and is a complaint that carries with it a high risk of complications and morbidity.  The differential diagnosis includes stroke vs intracranial hemorrhage vs complex concussion vs BPPV vs electrolyte abnormality   Co morbidities that complicate the patient evaluation  Anticoagulant use Osteopenia Hypertension Hyperlipidemia GERD   Additional history obtained:  Additional history obtained from previous ED notes   Lab Tests:  I ordered,  and personally interpreted labs - hyponatremia, otherwise within normal limits   Imaging Studies ordered:  I ordered imaging studies including CT angiogram head and neck  I independently visualized and interpreted imaging which showed no intracranial hemorrhage but multiple thyroid nodules I agree with the radiologist interpretation   Consultations Obtained:  I requested consultation with the hospitalist,  and discussed lab and imaging findings as well as pertinent plan - they recommend: Admission for further workup   Problem List / ED Course / Critical interventions / Medication management  Post-concussive syndrome, symptomatic hyponatremia I have reviewed the patients home medicines and have made adjustments as needed   Social Determinants of Health:  Housing   Test / Admission - Considered:  Patient is being admitted for symptomatic hyponatremia        Final Clinical Impression(s) / ED Diagnoses Final diagnoses:  None    Rx / DC Orders ED Discharge Orders     None         Maxwell Marion, PA-C 06/28/22 2013    Derwood Kaplan, MD 06/30/22 1511

## 2022-06-28 NOTE — H&P (Signed)
History and Physical    Patient: [name]:[id number] DOB: April 03, 1940 DOA: 06/28/2022 DOS: the patient was seen and examined on 06/28/2022 PCP: Tommie Sams, DO  Patient coming from: Home  Chief Complaint:  Chief Complaint  Patient presents with   Nausea   HPI: [name] is a 82 y.o. female with medical history significant of thyroid nodules, hypertension, hyperlipidemia, hard of hearing with hearing aids, and more presents the ED with a chief complaint of altered mental status.  Patient reports that she had a fall 3 weeks ago.  It was a mechanical fall, she got up during the night to go to the bathroom and tried to run to the bathroom as she was worried she would not make it.  And route she tripped on a rug and fell headfirst into a chest of drawers per her report.  She landed on her right shoulder.  She heard a crunch in her neck and reports that she had had whiplash.  EMTs brought her into the emergency room and scans were negative for acute changes, so patient was discharged home.  She reports that since then she has been in a brain fog.  She does not want to call it disorientation, but reports that it similar.  Her husband also says that she has not been herself per her report.  She feels unsteady on her feet.  She reports it is not dizziness it is just that she feels off balance.  She had nausea the day prior to presentation.  She had nausea and vomiting the day of presentation.  And she has been very fatigued.  Patient reports that she normally does yoga every day, goes to yoga studio twice a week, and gardens.  It is not normal for her to be very fatigued.  She has had a decreased appetite for the last 3 weeks as well.  Patient did not do any interval of brain rest after her head injury.  She presented to the ER today because she was worried that something more severe than just a concussion was going on.  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   Workup in the ED revealed hyponatremia which is likely a contributor to her symptoms, but postconcussive syndrome is likely also contributing.  Patient does not smoke.  She drinks 2 glasses of wine per night.  She has never had symptoms of withdrawal.  She does not use illicit drugs.  She is vaccinated for COVID and flu.  Patient has advanced directives on file.  They give certain scenarios where she would not want her life prolonged.  She does not have a DNR. Review of Systems: As mentioned in the history of present illness. All other systems reviewed and are negative. Past Medical History:  Diagnosis Date   Arthritis    Basal cell carcinoma 04/29/2019   right inner cheek [facility])   Basal cell carcinoma 04/29/2019   left side nose [facility])   Chronic kidney disease    Diverticulosis of colon    Essential hypertension    History of basal cell carcinoma (BCC) excision    02-10-2013  left cheek  s/p  moh's sx   History of palpitations    History of recurrent UTIs    History of syncope    2015-- felt to vasovagal response to pain (per cardiologist note)   IBS (irritable bowel syndrome)    Open wound of left knee  warm soaks daily /  neosprin and dressing daily / per per still has drainage   Patellar bursitis of left knee    pre-patella septic bursitis w/ foreign body   Right thyroid nodule    x2 right side incidental finding on carotid duplex 03/ 2018-- thyroid ultrasound done , benign , follow-up in one year   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/29/20201   in situ-right upper arm-anterior   Squamous cell carcinoma of skin 11/04/2019   KA-right lower leg-anterior superior   Wears hearing aid in both ears    Past Surgical History:  Procedure Laterality Date   APPENDECTOMY  age 89   BREAST BIOPSY  1962   benign   CATARACT EXTRACTION W/PHACO  06/25/2011   Procedure: CATARACT EXTRACTION PHACO AND INTRAOCULAR LENS PLACEMENT (IOC);  Surgeon: Susa Simmonds, MD;  Location: [facility];   Service: Ophthalmology;  Laterality: Right;  CDE: 8.90   CATARACT EXTRACTION W/PHACO Left 05/05/2012   Procedure: CATARACT EXTRACTION PHACO AND INTRAOCULAR LENS PLACEMENT (IOC);  Surgeon: Susa Simmonds, MD;  Location: [facility];  Service: Ophthalmology;  Laterality: Left;  CDE 12.78   COLONOSCOPY  last one 11-26-2012   CYST EXCISION Left 04/16/2022   Procedure: LEFT RING FINGER EXCISION MUCOID CYST AND DISTAL INTERPHALANGEAL JOINT DEBRIDEMENT;  Surgeon: Betha Loa, MD;  Location: [facility];  Service: Orthopedics;  Laterality: Left;   EXCISION MORTON'S NEUROMA Right 2003  approx.   INCISION AND DRAINAGE WOUND WITH FOREIGN BODY REMOVAL Left 08/30/2016   Procedure: LEFT KNEE INCISION AND DRAINAGE WOUND WITH FOREIGN BODY REMOVAL;  Surgeon: Eugenia Mcalpine, MD;  Location: [facility];  Service: Orthopedics;  Laterality: Left;   KNEE ARTHROPLASTY Right 03/14/2017   Procedure: RIGHT TOTAL KNEE ARTHROPLASTY WITH COMPUTER NAVIGATION AND REMOVAL OF HARDWARE;  Surgeon: Samson Frederic, MD;  Location: [facility];  Service: Orthopedics;  Laterality: Right;  Adductor Block   KNEE ARTHROSCOPY W/ ACL RECONSTRUCTION Right 1996   KNEE CARTILAGE SURGERY Right age 44   LUMBAR FUSION  08/28/2020   MOHS SURGERY  02/10/2013   left cheek -- [facility]   POSTERIOR REPAIR  05-21-2002   dr Emelda Fear   symptomatic recetocele   TONSILLECTOMY AND ADENOIDECTOMY  age 73   TRANSTHORACIC ECHOCARDIOGRAM  04-17-2016  dr Diona Browner   ef 60-65%/  trivial MR/ mild TR   VAGINAL HYSTERECTOMY  1979   w/  Bilateral Salpingoophorectomy   Social History:  reports that she quit smoking about 60 years ago. Her smoking use included cigarettes. She has a 5.00 pack-year smoking history. She has never used smokeless tobacco. She reports current alcohol use of about 14.0 standard drinks of alcohol per week. She reports that she does not use drugs.  Allergies  Allergen Reactions   Codeine Other (See Comments)    REACTION:  syncope   Penicillins Rash    Can tolerate cephalosporins    Family History  Problem Relation Age of Onset   Hypertension Mother    CVA Mother    Throat cancer Father    Pseudochol deficiency Neg Hx    Malignant hyperthermia Neg Hx    Hypotension Neg Hx    Anesthesia problems Neg Hx    Colon cancer Neg Hx    Esophageal cancer Neg Hx     Prior to Admission medications   Medication Sig Start Date End Date Taking? Authorizing Provider  acetaminophen (TYLENOL) 500 MG tablet Take by mouth. 06/04/17  Yes [provider]  aspirin EC  81 MG tablet Take 1 tablet (81 mg total) by mouth daily. Swallow whole. 07/15/20  Yes Jodelle Gross, NP  atorvastatin (LIPITOR) 40 MG tablet TAKE 1 TABLET ONCE DAILY. 06/26/22  Yes Cook, Jayce G, DO  Calcium Carbonate-Vit D-Min (CALCIUM 1200 PO) Take 600 mg by mouth 2 (two) times daily.   Yes [provider]  Flaxseed, Linseed, (FLAX PO) Take 1 Dose by mouth every morning.    Yes [provider]  hydrochlorothiazide (MICROZIDE) 12.5 MG capsule Take 1 capsule (12.5 mg total) by mouth daily. 06/01/22 08/30/22 Yes Jodelle Gross, NP  losartan (COZAAR) 100 MG tablet TAKE 1 TABLET BY MOUTH EVERY MORNING. Patient taking differently: Take 100 mg by mouth daily. 06/26/22  Yes Cook, Jayce G, DO  metoprolol tartrate (LOPRESSOR) 50 MG tablet TAKE (1) TABLET TWICE DAILY. Patient taking differently: Take 50 mg by mouth 2 (two) times daily. 06/28/22  Yes Cook, Verdis Frederickson, DO  Multiple Vitamins-Minerals (CENTRUM SILVER) tablet Take 1 tablet by mouth daily.   Yes [provider]  OMEGA 3 1000 MG CAPS Take 1,000 mg by mouth at bedtime.    Yes [provider]  Probiotic Product (PROBIOTIC DAILY PO) Take 1 tablet by mouth daily.   Yes [provider]  Psyllium (METAMUCIL PO) Take 1 Scoop by mouth daily.   Yes [provider]  methocarbamol (ROBAXIN) 500 MG tablet Take 1 tablet (500 mg total) by mouth every 8 (eight)  hours as needed for muscle spasms. Patient not taking: Reported on 06/28/2022 06/04/22   Sabas Sous, MD    Physical Exam: Vitals:   06/28/22 1452 06/28/22 1910 06/28/22 1930 06/28/22 2057  BP:  (!) 188/97 (!) 173/99 (!) 193/101  Pulse:  73 82 66  Resp:  18 18 16   Temp:    97.9 F (36.6 C)  TempSrc:      SpO2:  100% 98% 99%  Weight: 51.7 kg     Height: 5' (1.524 m)      1.  General: Patient lying supine in bed,  no acute distress   2. Psychiatric: Alert and oriented x 3, mood and behavior normal for situation, pleasant and cooperative with exam   3. Neurologic: Speech and language are normal, face is symmetric, moves all 4 extremities voluntarily, at baseline without acute deficits on limited exam   4. HEENMT:  Head is atraumatic, normocephalic, pupils reactive to light, neck is supple, trachea is midline, mucous membranes are moist   5. Respiratory : Lungs are clear to auscultation bilaterally without wheezing, rhonchi, rales, no cyanosis, no increase in work of breathing or accessory muscle use   6. Cardiovascular : Heart rate normal, rhythm is regular, no murmurs, rubs or gallops, no peripheral edema, peripheral pulses palpated   7. Gastrointestinal:  Abdomen is soft, nondistended, nontender to palpation bowel sounds active, no masses or organomegaly palpated   8. Skin:  Skin is warm, dry and intact without rashes, acute lesions, or ulcers on limited exam   9.Musculoskeletal:  No acute deformities or trauma, no asymmetry in tone, no peripheral edema, peripheral pulses palpated, no tenderness to palpation in the extremities  Data Reviewed: In the ED Temp 99.3, heart rate 73-82, respiratory rate 16-18, blood pressure 173/97-188/100, maintaining oxygen sats on room air No leukocytosis with a white blood cell count of 5.9, hemoglobin 13.6 Hyponatremia at 126 with previous sodium being 137 Hypokalemia at 3.1 Hypochloremia 88 CTA head shows no emergent large vessel  occlusion or proximal  hemodynamically significant stenosis.  It does show multiple thyroid nodules which patient is already aware of. CT C-spine and head show no acute changes CT thoracic spine shows no acute changes X-ray shoulder shows no acute finding Chest x-ray shows no acute changes but does show chronic interstitial thickening Admission was requested for symptomatic hyponatremia versus postconcussive syndrome  Assessment and Plan: * Acute hyponatremia -sodium down to 126 from 137 last month -complains of ataxia, dizziness, fatigue - NS bolus in ED -Continue 130ml/hr -Trend Na q 6 hours -osm pending -Monitor on tele  Altered mental status -fatigue, "brain fog" -2/2 hyponatremia vs post-concussive syndrome -encourage brain rest -reassess as Na trends towards normal  Hypokalemia -K+ 3.1 - potassium replaced in the ED -Trend on BMP  Multinodular goiter (nontoxic) -again demonstrated on CT -Checking TSH -may need to follow up with outpatient [place]   Mixed hyperlipidemia -Continue statin  Essential hypertension -Holding HCTZ and ARB 2/2 hyponatremia -Continue metoprolol -Given that her blood pressure has been so high during her ER course, as needed hydralazine will be added while these other medications are being held -Continue to monitor      Advance Care Planning:   Code Status: Full Code  Consults: Neuro  Family Communication: No family at bedside  Severity of Illness: The appropriate patient status for this patient is OBSERVATION. Observation status is judged to be reasonable and necessary in order to provide the required intensity of service to ensure the patient's safety. The patient's presenting symptoms, physical exam findings, and initial radiographic and laboratory data in the context of their medical condition is felt to place them at decreased risk for further clinical deterioration. Furthermore, it is anticipated that the patient will be  medically stable for discharge from the hospital within 2 midnights of admission.   Author: Lilyan Gilford, DO 06/28/2022 10:59 PM  For on call review [email or web address].

## 2022-06-29 DIAGNOSIS — E871 Hypo-osmolality and hyponatremia: Secondary | ICD-10-CM | POA: Diagnosis not present

## 2022-06-29 DIAGNOSIS — I10 Essential (primary) hypertension: Secondary | ICD-10-CM | POA: Diagnosis not present

## 2022-06-29 DIAGNOSIS — E782 Mixed hyperlipidemia: Secondary | ICD-10-CM | POA: Diagnosis not present

## 2022-06-29 DIAGNOSIS — E876 Hypokalemia: Secondary | ICD-10-CM | POA: Diagnosis not present

## 2022-06-29 LAB — COMPREHENSIVE METABOLIC PANEL
ALT: 22 U/L (ref 0–44)
AST: 20 U/L (ref 15–41)
Albumin: 4 g/dL (ref 3.5–5.0)
Alkaline Phosphatase: 66 U/L (ref 38–126)
Anion gap: 11 (ref 5–15)
BUN: 8 mg/dL (ref 8–23)
CO2: 24 mmol/L (ref 22–32)
Calcium: 8.9 mg/dL (ref 8.9–10.3)
Chloride: 98 mmol/L (ref 98–111)
Creatinine, Ser: 0.47 mg/dL (ref 0.44–1.00)
GFR, Estimated: >60 mL/min (ref >60)
Glucose, Bld: 90 mg/dL (ref 70–99)
Potassium: 3.6 mmol/L (ref 3.5–5.1)
Sodium: 133 mmol/L — ABNORMAL LOW (ref 135–145)
Total Bilirubin: 1.5 mg/dL — ABNORMAL HIGH (ref 0.3–1.2)
Total Protein: 6.4 g/dL — ABNORMAL LOW (ref 6.5–8.1)

## 2022-06-29 LAB — BASIC METABOLIC PANEL
Anion gap: 10 (ref 5–15)
Anion gap: 10 (ref 5–15)
BUN: 8 mg/dL (ref 8–23)
BUN: 9 mg/dL (ref 8–23)
CO2: 24 mmol/L (ref 22–32)
CO2: 24 mmol/L (ref 22–32)
Calcium: 8.7 mg/dL — ABNORMAL LOW (ref 8.9–10.3)
Calcium: 8.9 mg/dL (ref 8.9–10.3)
Chloride: 98 mmol/L (ref 98–111)
Chloride: 98 mmol/L (ref 98–111)
Creatinine, Ser: 0.57 mg/dL (ref 0.44–1.00)
Creatinine, Ser: 0.52 mg/dL (ref 0.44–1.00)
GFR, Estimated: >60 mL/min (ref >60)
GFR, Estimated: >60 mL/min (ref >60)
Glucose, Bld: 93 mg/dL (ref 70–99)
Glucose, Bld: 101 mg/dL — ABNORMAL HIGH (ref 70–99)
Potassium: 3.5 mmol/L (ref 3.5–5.1)
Potassium: 3.5 mmol/L (ref 3.5–5.1)
Sodium: 132 mmol/L — ABNORMAL LOW (ref 135–145)
Sodium: 132 mmol/L — ABNORMAL LOW (ref 135–145)

## 2022-06-29 LAB — OSMOLALITY: Osmolality: 281 mOsm/kg (ref 275–295)

## 2022-06-29 LAB — CBC WITH DIFFERENTIAL/PLATELET
Abs Immature Granulocytes: 0.01 10*3/uL (ref 0.00–0.07)
Basophils Absolute: 0 10*3/uL (ref 0.0–0.1)
Basophils Relative: 1 %
Eosinophils Absolute: 0.1 10*3/uL (ref 0.0–0.5)
Eosinophils Relative: 1 %
HCT: 38.6 % (ref 36.0–46.0)
Hemoglobin: 13.3 g/dL (ref 12.0–15.0)
Immature Granulocytes: 0 %
Lymphocytes Relative: 14 %
Lymphs Abs: 0.7 10*3/uL (ref 0.7–4.0)
MCH: 32.8 pg (ref 26.0–34.0)
MCHC: 34.5 g/dL (ref 30.0–36.0)
MCV: 95.3 fL (ref 80.0–100.0)
Monocytes Absolute: 0.7 10*3/uL (ref 0.1–1.0)
Monocytes Relative: 13 %
Neutro Abs: 3.7 10*3/uL (ref 1.7–7.7)
Neutrophils Relative %: 71 %
Platelets: 200 10*3/uL (ref 150–400)
RBC: 4.05 MIL/uL (ref 3.87–5.11)
RDW: 12.1 % (ref 11.5–15.5)
WBC: 5.2 10*3/uL (ref 4.0–10.5)
nRBC: 0 % (ref 0.0–0.2)

## 2022-06-29 LAB — MAGNESIUM: Magnesium: 1.9 mg/dL (ref 1.7–2.4)

## 2022-06-29 MED ORDER — HYDROCHLOROTHIAZIDE 12.5 MG PO CAPS: 12.5000 mg | ORAL_CAPSULE | Freq: Every day | ORAL | Status: DC

## 2022-06-29 MED ORDER — LOSARTAN POTASSIUM 50 MG PO TABS
100.0000 mg | ORAL_TABLET | Freq: Every morning | ORAL | Status: DC
  Administered 2022-06-29: 100 mg via ORAL
  Filled 2022-06-29: qty 2

## 2022-06-29 NOTE — Discharge Summary (Signed)
Physician Discharge Summary   Patient: [name] MRN: [id number] DOB: 16-Oct-1940  Admit date:     06/28/2022  Discharge date: 06/29/22  Discharge Physician: Vassie Loll   PCP: Tommie Sams, DO   Recommendations at discharge:  Repeat basic metabolic panel to follow ultralights renal function Reassessed blood pressure and adjust antihypertensive treatment as needed Patient ultrasound of patient's thyroid along with further evaluation/definitive treatment as needed.  Discharge Diagnoses: Principal Problem:   Acute hyponatremia Active Problems:   Essential hypertension   Mixed hyperlipidemia   Multinodular goiter (nontoxic)   Hypokalemia   Altered mental status/metabolic encephalopathy.  Brief Hospital admission narrative: As per H&P written by Dr. Carren Rang on 06/28/22 [name] is a 82 y.o. female with medical history significant of thyroid nodules, hypertension, hyperlipidemia, hard of hearing with hearing aids, and more presents the ED with a chief complaint of altered mental status.  Patient reports that she had a fall 3 weeks ago.  It was a mechanical fall, she got up during the night to go to the bathroom and tried to run to the bathroom as she was worried she would not make it.  And route she tripped on a rug and fell headfirst into a chest of drawers per her report.  She landed on her right shoulder.  She heard a crunch in her neck and reports that she had had whiplash.  EMTs brought her into the emergency room and scans were negative for acute changes, so patient was discharged home.  She reports that since then she has been in a brain fog.  She does not want to call it disorientation, but reports that it similar.  Her husband also says that she has not been herself per her report.  She feels unsteady on her feet.  She reports it is not dizziness it is just that she feels off balance.  She had nausea the day prior to presentation.  She had nausea and vomiting the day  of presentation.  And she has been very fatigued.  Patient reports that she normally does yoga every day, goes to yoga studio twice a week, and gardens.  It is not normal for her to be very fatigued.  She has had a decreased appetite for the last 3 weeks as well.  Patient did not do any interval of brain rest after her head injury.  She presented to the ER today because she was worried that something more severe than just a concussion was going on.  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  Workup in the ED revealed hyponatremia which is likely a contributor to her symptoms, but postconcussive syndrome is likely also contributing.   Assessment and Plan: * Acute hyponatremia -sodium down to 126 at time of admission -Patient reports dizziness, ataxia and increased fatigue -CT scan demonstrating no acute intracranial abnormalities -Symptoms significantly improved/resolved after fluid resuscitation and electrolyte stabilization -Patient has been discharged home in a stable condition -Repeat basic metabolic panel at follow-up visit to assess electrolytes trend/stability.  Altered mental status/metabolic encephalopathy. -Described by patient by brain fog and is slow to respond -TSH was normal; CT scan negative for acute intracranial normalities -Patient always oriented x 4 and without any associated focal deficits -Symptoms improved/completely resolved after electrolyte repletion -Most likely mild component of metabolic encephalopathy.  Hypokalemia -Potassium repleted -Associated with the use of diuretics and decreased oral intake -Resuming the use of losartan at discharge -Repeat  basic metabolic panel to follow electrolytes trend.  Multinodular goiter (nontoxic) -again demonstrated on CT -TSH within normal limits -Outpatient thyroid ultrasound and further evaluation Definitive management recommended.   Mixed hyperlipidemia -Continue statin -Heart healthy  diet discussed with patient.  Essential hypertension -Stable overall with negative orthostatic changes -Resume the use of losartan for now continue holding HCTZ in the setting of hyponatremia -Patient advised to maintain adequate hydration and to follow heart healthy diet -Outpatient follow-up with PCP.   Consultants: None Procedures performed: See below for x-ray report. Disposition: Home Diet recommendation: Heart healthy diet.  DISCHARGE MEDICATION: Allergies as of 06/29/2022       Reactions   Codeine Other (See Comments)   REACTION: syncope   Penicillins Rash   Can tolerate cephalosporins        Medication List     STOP taking these medications    methocarbamol 500 MG tablet Commonly known as: ROBAXIN       TAKE these medications    acetaminophen 500 MG tablet Commonly known as: TYLENOL Take by mouth.   aspirin EC 81 MG tablet Take 1 tablet (81 mg total) by mouth daily. Swallow whole.   atorvastatin 40 MG tablet Commonly known as: LIPITOR TAKE 1 TABLET ONCE DAILY.   CALCIUM 1200 PO Take 600 mg by mouth 2 (two) times daily.   Centrum Silver tablet Take 1 tablet by mouth daily.   FLAX PO Take 1 Dose by mouth every morning.   hydrochlorothiazide 12.5 MG capsule Commonly known as: MICROZIDE Take 1 capsule (12.5 mg total) by mouth daily. Start taking on: Jul 02, 2022 What changed: These instructions start on Jul 02, 2022. If you are unsure what to do until then, ask your doctor or other care provider.   losartan 100 MG tablet Commonly known as: COZAAR TAKE 1 TABLET BY MOUTH EVERY MORNING. What changed: when to take this   METAMUCIL PO Take 1 Scoop by mouth daily.   metoprolol tartrate 50 MG tablet Commonly known as: LOPRESSOR TAKE (1) TABLET TWICE DAILY. What changed: See the new instructions.   Omega 3 1000 MG Caps Take 1,000 mg by mouth at bedtime.   PROBIOTIC DAILY PO Take 1 tablet by mouth daily.        Follow-up Information      Tommie Sams, DO. Schedule an appointment as soon as possible for a visit in 10 day(s).   Specialty: Family Medicine Contact information: [address] [place] [address] [phone]                Discharge Exam: Ceasar Mons Weights   06/28/22 1452 06/28/22 2057  Weight: 51.7 kg 50.5 kg   General exam: Alert, awake, oriented x 3 Respiratory system: Clear to auscultation. Respiratory effort normal. Cardiovascular system:RRR. No murmurs, rubs, gallops. Gastrointestinal system: Abdomen is nondistended, soft and nontender. No organomegaly or masses felt. Normal bowel sounds heard. Central nervous system: Alert and oriented. No focal neurological deficits. Extremities: No C/C/E, +pedal pulses Skin: No rashes, lesions or ulcers Psychiatry: Judgement and insight appear normal. Mood & affect appropriate.    Condition at discharge: Stable and improved.  The results of significant diagnostics from this hospitalization (including imaging, microbiology, ancillary and laboratory) are listed below for reference.   Imaging Studies: CT ANGIO HEAD NECK W WO CM  Result Date: 06/28/2022 CLINICAL DATA:  Head trauma, intracranial arterial injury suspected EXAM: CT ANGIOGRAPHY HEAD AND NECK WITH AND WITHOUT CONTRAST TECHNIQUE: Multidetector CT imaging of the  head and neck was performed using the standard protocol during bolus administration of intravenous contrast. Multiplanar CT image reconstructions and MIPs were obtained to evaluate the vascular anatomy. Carotid stenosis measurements (when applicable) are obtained utilizing NASCET criteria, using the distal internal carotid diameter as the denominator. RADIATION DOSE REDUCTION: This exam was performed according to the departmental dose-optimization program which includes automated exposure control, adjustment of the mA and/or kV according to patient size and/or use of iterative reconstruction technique. CONTRAST:  75mL OMNIPAQUE IOHEXOL  350 MG/ML SOLN COMPARISON:  CT head June 04, 2022. FINDINGS: CT HEAD FINDINGS Brain: No evidence of acute infarction, hemorrhage, hydrocephalus, extra-axial collection or mass lesion/mass effect. Similar patchy white matter hypodensities, nonspecific but compatible with chronic microvascular ischemic disease. Vascular: See below. Skull: Normal. Negative for fracture or focal lesion. Sinuses/Orbits: No acute finding. Review of the MIP images confirms the above findings CTA NECK FINDINGS Aortic arch: Great vessel origins are patent. Aortic atherosclerosis. Right carotid system: No evidence of dissection, stenosis (50% or greater), or occlusion. Left carotid system: Atherosclerosis at the carotid bifurcation without greater than 50% stenosis. No evidence of dissection Vertebral arteries: Left dominant. No evidence of dissection, stenosis (50% or greater), or occlusion. Skeleton: No obvious evidence of acute abnormality on limited assessment. Please see recent CT of the cervical spine from June 04, 2022 for further evaluation. Other neck: Multiple thyroid nodules measuring up to 2.9 cm. Upper chest: Visualized lung apices are clear aside from biapical pleuroparenchymal scarring. Review of the MIP images confirms the above findings CTA HEAD FINDINGS Anterior circulation: Bilateral ICAs, MCAs, and ACAs are patent without proximal hemodynamically significant stenosis. Early right MCA bifurcation, anatomic variant. Posterior circulation: Bilateral intradural vertebral arteries, basilar artery and bilateral posterior cerebral arteries are patent without proximal hemodynamically significant stenosis. Venous sinuses: As permitted by contrast timing, patent. Review of the MIP images confirms the above findings IMPRESSION: 1. No emergent large vessel occlusion or proximal hemodynamically significant stenosis. 2. No evidence of acute arterial injury. 3. Multiple thyroid nodules, measuring up to 2.9 cm. Recommend thyroid US  (ref: J Am Coll Radiol. 2015 Feb;12(2): 143-50). Electronically Signed   By: Feliberto Harts M.D.   On: 06/28/2022 17:37   CT Thoracic Spine Wo Contrast  Result Date: 06/04/2022 CLINICAL DATA:  Back trauma with no prior imaging EXAM: CT THORACIC SPINE WITHOUT CONTRAST TECHNIQUE: Multidetector CT images of the thoracic were obtained using the standard protocol without intravenous contrast. RADIATION DOSE REDUCTION: This exam was performed according to the departmental dose-optimization program which includes automated exposure control, adjustment of the mA and/or kV according to patient size and/or use of iterative reconstruction technique. COMPARISON:  None Available. FINDINGS: Alignment: Normal. Vertebrae: No acute fracture or focal pathologic process. Paraspinal and other soft tissues: Negative. Disc levels: Ordinary degenerative thoracic disc space narrowing and mild endplate spurring IMPRESSION: No acute finding. Electronically Signed   By: Tiburcio Pea M.D.   On: 06/04/2022 07:17   CT HEAD WO CONTRAST ( )  Result Date: 06/04/2022 CLINICAL DATA:  Moderate to severe head trauma EXAM: CT HEAD WITHOUT CONTRAST CT CERVICAL SPINE WITHOUT CONTRAST TECHNIQUE: Multidetector CT imaging of the head and cervical spine was performed following the standard protocol without intravenous contrast. Multiplanar CT image reconstructions of the cervical spine were also generated. RADIATION DOSE REDUCTION: This exam was performed according to the departmental dose-optimization program which includes automated exposure control, adjustment of the mA and/or kV according to patient size and/or use of iterative reconstruction technique. COMPARISON:  None Available. FINDINGS: CT HEAD FINDINGS Brain: No evidence of acute infarction, hemorrhage, hydrocephalus, extra-axial collection or mass lesion/mass effect. Generalized cerebral volume loss. Mild chronic small vessel ischemia in the cerebral white matter. Vascular: No  hyperdense vessel or unexpected calcification. Skull: Normal. Negative for fracture or focal lesion. Sinuses/Orbits: Bilateral cataract resection CT CERVICAL SPINE FINDINGS Alignment: No traumatic malalignment Skull base and vertebrae: No acute fracture. No primary bone lesion or focal pathologic process. Soft tissues and spinal canal: No prevertebral fluid or swelling. No visible canal hematoma. Dominant right thyroid nodule measuring 2.9 cm craniocaudal. No follow-up recommended unless clinically warranted (ref: J Am Coll Radiol. 2015 Feb;12(2): 143-50). Disc levels: generalized degenerative disc space narrowing with endplate and facet spurring. C2-3 facet ankylosis. Upper chest: Negative IMPRESSION: No evidence of acute intracranial or cervical spine injury. Electronically Signed   By: Tiburcio Pea M.D.   On: 06/04/2022 07:12   CT CERVICAL SPINE WO CONTRAST  Result Date: 06/04/2022 CLINICAL DATA:  Moderate to severe head trauma EXAM: CT HEAD WITHOUT CONTRAST CT CERVICAL SPINE WITHOUT CONTRAST TECHNIQUE: Multidetector CT imaging of the head and cervical spine was performed following the standard protocol without intravenous contrast. Multiplanar CT image reconstructions of the cervical spine were also generated. RADIATION DOSE REDUCTION: This exam was performed according to the departmental dose-optimization program which includes automated exposure control, adjustment of the mA and/or kV according to patient size and/or use of iterative reconstruction technique. COMPARISON:  None Available. FINDINGS: CT HEAD FINDINGS Brain: No evidence of acute infarction, hemorrhage, hydrocephalus, extra-axial collection or mass lesion/mass effect. Generalized cerebral volume loss. Mild chronic small vessel ischemia in the cerebral white matter. Vascular: No hyperdense vessel or unexpected calcification. Skull: Normal. Negative for fracture or focal lesion. Sinuses/Orbits: Bilateral cataract resection CT CERVICAL SPINE  FINDINGS Alignment: No traumatic malalignment Skull base and vertebrae: No acute fracture. No primary bone lesion or focal pathologic process. Soft tissues and spinal canal: No prevertebral fluid or swelling. No visible canal hematoma. Dominant right thyroid nodule measuring 2.9 cm craniocaudal. No follow-up recommended unless clinically warranted (ref: J Am Coll Radiol. 2015 Feb;12(2): 143-50). Disc levels: generalized degenerative disc space narrowing with endplate and facet spurring. C2-3 facet ankylosis. Upper chest: Negative IMPRESSION: No evidence of acute intracranial or cervical spine injury. Electronically Signed   By: Tiburcio Pea M.D.   On: 06/04/2022 07:12   DG Shoulder Right  Result Date: 06/04/2022 CLINICAL DATA:  Fall with right shoulder pain EXAM: RIGHT SHOULDER - 3 VIEW COMPARISON:  None Available. FINDINGS: There is no evidence of fracture or dislocation. There is no evidence of arthropathy or other focal bone abnormality. Soft tissues are unremarkable. Mild spurring at the acromioclavicular and glenohumeral joints. IMPRESSION: No acute finding. Electronically Signed   By: Tiburcio Pea M.D.   On: 06/04/2022 07:01   DG Chest Port 1 View  Result Date: 06/04/2022 CLINICAL DATA:  Status post fall. EXAM: PORTABLE CHEST 1 VIEW COMPARISON:  11/13/2021 FINDINGS: Stable cardiomediastinal contours. No pleural fluid or interstitial edema. Chronic coarsened interstitial markings. No airspace opacity. No signs of pneumothorax. No acute osseous findings identified. IMPRESSION: 1. No acute findings. 2. Chronic interstitial coarsening. Electronically Signed   By: Signa Kell M.D.   On: 06/04/2022 07:00    Microbiology: Results for orders placed or performed during the hospital encounter of 07/08/21  Urine Culture     Status: None   Collection Time: 07/08/21 12:49 PM   Specimen: Urine, Clean Catch  Result Value Ref Range Status  Specimen Description   Final    URINE, CLEAN  CATCH Performed at [facility], [address] [place], [place] [address]    Special Requests   Final    NONE Performed at [facility], [address], [place], [place] [address]    Culture   Final    NO GROWTH Performed at [facility], [address], [place], [place] [address]    Report Status 07/09/2021 FINAL  Final    Labs: CBC: Recent Labs  Lab 06/28/22 1550 06/29/22 0742  WBC 5.9 5.2  NEUTROABS  --  3.7  HGB 13.6 13.3  HCT 39.5 38.6  MCV 94.5 95.3  PLT 223 200   Basic Metabolic Panel: Recent Labs  Lab 06/28/22 1550 06/28/22 2009 06/29/22 0208 06/29/22 0742  NA 126* 129* 133* 132*  K 3.1* 3.4* 3.6 3.5  CL 88* 91* 98 98  CO2 27 25 24 24   GLUCOSE 105* 92 90 93  BUN 11 10 8 8   CREATININE 0.57 0.50 0.47 0.57  CALCIUM 9.4 9.5 8.9 8.7*  MG  --   --  1.9  --    Liver Function Tests: Recent Labs  Lab 06/28/22 1550 06/29/22 0208  AST 20 20  ALT 22 22  ALKPHOS 71 66  BILITOT 1.5* 1.5*  PROT 6.9 6.4*  ALBUMIN 4.6 4.0   CBG: No results for input(s): "GLUCAP" in the last 168 hours.  Discharge time spent: greater than 30 minutes.  Signed: Vassie Loll, MD [facility] 06/29/2022

## 2022-06-29 NOTE — Evaluation (Signed)
Physical Therapy Evaluation Patient Details Name: [name] MRN: [id number] DOB: Nov 16, 1940 Today's Date: 06/29/2022  History of Present Illness  [name] is a 82 y.o. female with medical history significant of thyroid nodules, hypertension, hyperlipidemia, hard of hearing with hearing aids, and more presents the ED with a chief complaint of altered mental status.  Patient reports that she had a fall 3 weeks ago.  It was a mechanical fall, she got up during the night to go to the bathroom and tried to run to the bathroom as she was worried she would not make it.  And route she tripped on a rug and fell headfirst into a chest of drawers per her report.  She landed on her right shoulder.  She heard a crunch in her neck and reports that she had had whiplash.  EMTs brought her into the emergency room and scans were negative for acute changes, so patient was discharged home.  She reports that since then she has been in a brain fog.  She does not want to call it disorientation, but reports that it similar.  Her husband also says that she has not been herself per her report.  She feels unsteady on her feet.  She reports it is not dizziness it is just that she feels off balance.  She had nausea the day prior to presentation.  She had nausea and vomiting the day of presentation.  And she has been very fatigued.  Patient reports that she normally does yoga every day, goes to yoga studio twice a week, and gardens.  It is not normal for her to be very fatigued.  She has had a decreased appetite for the last 3 weeks as well.  Patient did not do any interval of brain rest after her head injury.  She presented to the ER today because she was worried that something more severe than just a concussion was going on.  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  Workup in the ED revealed hyponatremia which is likely a contributor to her symptoms, but postconcussive syndrome is likely also  contributing.   Clinical Impression  Patient functioning near baseline for functional mobility and gait other than c/o right shoulder pain when raising arm higher than 90 degrees flexion, abduction, otherwise demonstrates good return for bed mobility, transfers and ambulating in room, hallway and on stairs.  Patient encouraged to ambulate ad lib in room and hallways for length of stay.  Plan:  Patient discharged from physical therapy to care of nursing for ambulation daily as tolerated for length of stay.         Recommendations for follow up therapy are one component of a multi-disciplinary discharge planning process, led by the attending physician.  Recommendations may be updated based on patient status, additional functional criteria and insurance authorization.  Follow Up Recommendations       Assistance Recommended at Discharge    Patient can return home with the following  Other (comment) (near baseline)    Equipment Recommendations None recommended by PT  Recommendations for Other Services       Functional Status Assessment Patient has had a recent decline in their functional status and demonstrates the ability to make significant improvements in function in a reasonable and predictable amount of time.     Precautions / Restrictions Precautions Precautions: None Restrictions Weight Bearing Restrictions: No      Mobility  Bed Mobility Overal bed  mobility: Independent                  Transfers Overall transfer level: Independent                      Ambulation/Gait Ambulation/Gait assistance: Modified independent (Device/Increase time) Gait Distance (Feet): 150 Feet Assistive device: None Gait Pattern/deviations: WFL(Within Functional Limits) Gait velocity: near normal     General Gait Details: grossly WFL with good return for ambulation in room/hallway without loss of balance without need for an AD  Stairs Stairs: Yes Stairs assistance:  Modified independent (Device/Increase time) Stair Management: Alternating pattern, One rail Right, One rail Left Number of Stairs: 4 General stair comments: demonstrates good return for going up/down stairs in [place] without loss of balance using 1 side rail  Wheelchair Mobility    Modified Rankin (Stroke Patients Only)       Balance Overall balance assessment: No apparent balance deficits (not formally assessed)                                           Pertinent Vitals/Pain Pain Assessment Pain Assessment: 0-10 Pain Score: 5  Pain Location: right shoulder Pain Descriptors / Indicators: Sore, Discomfort Pain Intervention(s): Limited activity within patient's tolerance, Monitored during session, Repositioned    Home Living Family/patient expects to be discharged to:: Private residence Living Arrangements: Spouse/significant other Available Help at Discharge: Family;Available 24 hours/day Type of Home: House Home Access: Stairs to enter;Level entry Entrance Stairs-Rails: Right;Left;Can reach both Entrance Stairs-Number of Steps: 3 in front without side rails, level entry to side, 4 steps in back with bilateral side rails   Home Layout: One level Home Equipment: Agricultural consultant (2 wheels);Cane - single point      Prior Function Prior Level of Function : Independent/Modified Independent;Driving             Mobility Comments: Tourist information centre manager, drives ADLs Comments: Independent     Hand Dominance        Extremity/Trunk Assessment   Upper Extremity Assessment Upper Extremity Assessment: Overall WFL for tasks assessed;RUE deficits/detail RUE Deficits / Details: grossly 4/5 RUE: Unable to fully assess due to pain RUE Sensation: WNL RUE Coordination: WNL    Lower Extremity Assessment Lower Extremity Assessment: Overall WFL for tasks assessed    Cervical / Trunk Assessment Cervical / Trunk Assessment: Normal  Communication    Communication: HOH  Cognition Arousal/Alertness: Awake/alert Behavior During Therapy: WFL for tasks assessed/performed Overall Cognitive Status: Within Functional Limits for tasks assessed                                          General Comments      Exercises     Assessment/Plan    PT Assessment All further PT needs can be met in the next venue of care  PT Problem List Decreased strength;Decreased range of motion       PT Treatment Interventions      PT Goals (Current goals can be found in the Care Plan section)  Acute Rehab PT Goals Patient Stated Goal: return home with family to assist PT Goal Formulation: With patient Time For Goal Achievement: 06/29/22 Potential to Achieve Goals: Good    Frequency       Co-evaluation  AM-PAC PT "6 Clicks" Mobility  Outcome Measure Help needed turning from your back to your side while in a flat bed without using bedrails?: None Help needed moving from lying on your back to sitting on the side of a flat bed without using bedrails?: None Help needed moving to and from a bed to a chair (including a wheelchair)?: None Help needed standing up from a chair using your arms (e.g., wheelchair or bedside chair)?: None Help needed to walk in hospital room?: None Help needed climbing 3-5 steps with a railing? : A Little 6 Click Score: 23    End of Session   Activity Tolerance: Patient tolerated treatment well Patient left: in chair;with call bell/phone within reach Nurse Communication: Mobility status PT Visit Diagnosis: Unsteadiness on feet (R26.81);Other abnormalities of gait and mobility (R26.89);Muscle weakness (generalized) (M62.81)    Time: 1610-9604 PT Time Calculation (min) (ACUTE ONLY): 20 min   Charges:   PT Evaluation $PT Eval Moderate Complexity: 1 Mod PT Treatments $Therapeutic Activity: 8-22 mins        10:51 AM, 06/29/22 Ocie Bob, MPT Physical Therapist with  [facility] [phone] office [phone] mobile phone

## 2022-06-29 NOTE — TOC Initial Note (Signed)
Transition of Care [facility]) - Initial/Assessment Note    Patient Details  Name: [name] MRN: [id number] Date of Birth: April 04, 1940  Transition of Care [facility]) CM/SW Contact:    Annice Needy, LCSW Phone Number: 06/29/2022, 11:16 AM  Clinical Narrative:                 Patient from home with spouse. Independent at baseline, drives. PT recommends OPPT. Patient prefers OPPT at [facility] in [place] as she has had it there in the past. Referral made.   Expected Discharge Plan: OP Rehab Barriers to Discharge: No Barriers Identified   Patient Goals and CMS Choice Patient states their goals for this hospitalization and ongoing recovery are:: return home   Choice offered to / list presented to : Patient      Expected Discharge Plan and Services       Living arrangements for the past 2 months: Single Family Home                                      Prior Living Arrangements/Services Living arrangements for the past 2 months: Single Family Home Lives with:: Spouse Patient language and need for interpreter reviewed:: Yes        Need for Family Participation in Patient Care: Yes (Comment) Care giver support system in place?: Yes (comment)   Criminal Activity/Legal Involvement Pertinent to Current Situation/Hospitalization: No - Comment as needed  Activities of Daily Living Home Assistive Devices/Equipment: None ADL Screening (condition at time of admission) Patient's cognitive ability adequate to safely complete daily activities?: Yes Is the patient deaf or have difficulty hearing?: No Does the patient have difficulty seeing, even when wearing glasses/contacts?: No Does the patient have difficulty concentrating, remembering, or making decisions?: No Patient able to express need for assistance with ADLs?: Yes Does the patient have difficulty dressing or bathing?: No Independently performs ADLs?: Yes (appropriate for developmental age) Does the patient have  difficulty walking or climbing stairs?: No Weakness of Legs: None Weakness of Arms/Hands: None  Permission Sought/Granted                  Emotional Assessment     Affect (typically observed): Appropriate Orientation: : Oriented to Self, Oriented to Place, Oriented to  Time, Oriented to Situation Alcohol / Substance Use: Not Applicable Psych Involvement: No (comment)  Admission diagnosis:  Acute hyponatremia [E87.1] 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   Essential hypertension 03/25/2020   S/P knee surgery 08/30/2016   History of recurrent UTIs 09/19/2015   Osteopenia 05/21/2015   PCP:  Tommie Sams, DO Pharmacy:   [facility] [place] [place] - [address] [place] [address] Phone: [phone] Fax: [phone]     Social Determinants of Health (SDOH) Social History: SDOH Screenings   Food Insecurity: No Food Insecurity (06/28/2022)  Housing: Low Risk  (06/28/2022)  Transportation Needs: No Transportation Needs (06/28/2022)  Utilities: Not At Risk (06/28/2022)  Depression (PHQ2-9): Low Risk  (01/25/2022)  Tobacco Use: Medium Risk (06/28/2022)   SDOH Interventions:     Readmission Risk Interventions     No data to display

## 2022-07-03 ENCOUNTER — Telehealth: Payer: Self-pay | Admitting: Nurse Practitioner

## 2022-07-03 DIAGNOSIS — Z79899 Other long term (current) drug therapy: Secondary | ICD-10-CM

## 2022-07-03 DIAGNOSIS — M25511 Pain in right shoulder: Secondary | ICD-10-CM | POA: Diagnosis not present

## 2022-07-03 NOTE — Telephone Encounter (Signed)
Patient says that she was in the hospital on 06/28/22 and did a lot of blood work. Says that she would like for Sharlene Dory or nurse to look at her blood work to see if she needs to do anymore blood work for her appt on 07/06/22. Please call back

## 2022-07-04 ENCOUNTER — Other Ambulatory Visit (HOSPITAL_COMMUNITY)
Admission: RE | Admit: 2022-07-04 | Discharge: 2022-07-04 | Disposition: A | Discharge status: Home / Self Care | Payer: Medicare Other | Source: Ambulatory Visit | Attending: Nurse Practitioner | Admitting: Nurse Practitioner

## 2022-07-04 DIAGNOSIS — Z79899 Other long term (current) drug therapy: Secondary | ICD-10-CM | POA: Insufficient documentation

## 2022-07-04 LAB — BASIC METABOLIC PANEL
Anion gap: 15 (ref 5–15)
BUN: 8 mg/dL (ref 8–23)
CO2: 24 mmol/L (ref 22–32)
Calcium: 9.9 mg/dL (ref 8.9–10.3)
Chloride: 90 mmol/L — ABNORMAL LOW (ref 98–111)
Creatinine, Ser: 0.6 mg/dL (ref 0.44–1.00)
GFR, Estimated: >60 mL/min (ref >60)
Glucose, Bld: 110 mg/dL — ABNORMAL HIGH (ref 70–99)
Potassium: 3.9 mmol/L (ref 3.5–5.1)
Sodium: 129 mmol/L — ABNORMAL LOW (ref 135–145)

## 2022-07-04 LAB — MAGNESIUM: Magnesium: 1.9 mg/dL (ref 1.7–2.4)

## 2022-07-04 NOTE — Telephone Encounter (Signed)
Patient notified and verbalized understanding. Patient had no further questions or concerns.  

## 2022-07-06 ENCOUNTER — Ambulatory Visit: Payer: Medicare Other | Attending: Nurse Practitioner | Admitting: Nurse Practitioner

## 2022-07-06 ENCOUNTER — Encounter: Payer: Self-pay | Admitting: Nurse Practitioner

## 2022-07-06 VITALS — BP 180/100 | HR 61 | Ht 60.0 in | Wt 113.0 lb

## 2022-07-06 DIAGNOSIS — E871 Hypo-osmolality and hyponatremia: Secondary | ICD-10-CM | POA: Diagnosis not present

## 2022-07-06 DIAGNOSIS — I739 Peripheral vascular disease, unspecified: Secondary | ICD-10-CM | POA: Diagnosis not present

## 2022-07-06 DIAGNOSIS — I6522 Occlusion and stenosis of left carotid artery: Secondary | ICD-10-CM | POA: Diagnosis not present

## 2022-07-06 DIAGNOSIS — I771 Stricture of artery: Secondary | ICD-10-CM | POA: Diagnosis not present

## 2022-07-06 DIAGNOSIS — G458 Other transient cerebral ischemic attacks and related syndromes: Secondary | ICD-10-CM | POA: Insufficient documentation

## 2022-07-06 DIAGNOSIS — E042 Nontoxic multinodular goiter: Secondary | ICD-10-CM | POA: Insufficient documentation

## 2022-07-06 DIAGNOSIS — I10 Essential (primary) hypertension: Secondary | ICD-10-CM | POA: Insufficient documentation

## 2022-07-06 DIAGNOSIS — E7800 Pure hypercholesterolemia, unspecified: Secondary | ICD-10-CM

## 2022-07-06 DIAGNOSIS — I701 Atherosclerosis of renal artery: Secondary | ICD-10-CM | POA: Insufficient documentation

## 2022-07-06 NOTE — Patient Instructions (Signed)
Medication Instructions:  Continue all current medications.   Labwork: none  Testing/Procedures: none  Follow-Up: 6 months   Any Other Special Instructions Will Be Listed Below (If Applicable).   If you need a refill on your cardiac medications before your next appointment, please call your pharmacy.  

## 2022-07-06 NOTE — Progress Notes (Unsigned)
Office Visit    Patient Name: JI Salinas Date of Encounter: 07/06/2022  PCP:  Tommie Sams DO   [facility]  Cardiologist:  Nona Dell, MD  Advanced Practice Provider:  Sharlene Dory, NP Electrophysiologist:  None   Chief Complaint    [name] is a delightful 82 y.o. female with a hx of hypertension, carotid artery disease, right subclavian steal syndrome, PAD, lumbar stenosis, right thyroid nodule, IBS, and CKD  who presents today for preoperative cardiovascular risk assessment for pending left ring finger excision mucoid cyst and distal interphalangeal joint debridement, possible rotation flap on April 16, 2022.   Past Medical History    Past Medical History:  Diagnosis Date   Arthritis    Basal cell carcinoma 04/29/2019   right inner cheek [facility])   Basal cell carcinoma 04/29/2019   left side nose (MOHs)   Chronic kidney disease    Diverticulosis of colon    Essential hypertension    History of basal cell carcinoma (BCC) excision    02-10-2013  left cheek  s/p  moh's sx   History of palpitations    History of recurrent UTIs    History of syncope    2015-- felt to vasovagal response to pain (per cardiologist note)   IBS (irritable bowel syndrome)    Open wound of left knee    warm soaks daily /  neosprin and dressing daily / per per still has drainage   Patellar bursitis of left knee    pre-patella septic bursitis w/ foreign body   Right thyroid nodule    x2 right side incidental finding on carotid duplex 03/ 2018-- thyroid ultrasound done , benign , follow-up in one year   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/29/20201   in situ-right upper arm-anterior   Squamous cell carcinoma of skin 11/04/2019   KA-right lower leg-anterior superior   Wears hearing aid in both ears    Past Surgical History:  Procedure Laterality Date   APPENDECTOMY  age 60   BREAST BIOPSY  1962   benign   CATARACT EXTRACTION W/PHACO  06/25/2011    Procedure: CATARACT EXTRACTION PHACO AND INTRAOCULAR LENS PLACEMENT (IOC);  Surgeon: Susa Simmonds, MD;  Location: [facility];  Service: Ophthalmology;  Laterality: Right;  CDE: 8.90   CATARACT EXTRACTION W/PHACO Left 05/05/2012   Procedure: CATARACT EXTRACTION PHACO AND INTRAOCULAR LENS PLACEMENT (IOC);  Surgeon: Susa Simmonds, MD;  Location: [facility];  Service: Ophthalmology;  Laterality: Left;  CDE 12.78   COLONOSCOPY  last one 11-26-2012   CYST EXCISION Left 04/16/2022   Procedure: LEFT RING FINGER EXCISION MUCOID CYST AND DISTAL INTERPHALANGEAL JOINT DEBRIDEMENT;  Surgeon: Betha Loa, MD;  Location: [facility];  Service: Orthopedics;  Laterality: Left;   EXCISION MORTON'S NEUROMA Right 2003  approx.   INCISION AND DRAINAGE WOUND WITH FOREIGN BODY REMOVAL Left 08/30/2016   Procedure: LEFT KNEE INCISION AND DRAINAGE WOUND WITH FOREIGN BODY REMOVAL;  Surgeon: Eugenia Mcalpine, MD;  Location: [facility] [place];  Service: Orthopedics;  Laterality: Left;   KNEE ARTHROPLASTY Right 03/14/2017   Procedure: RIGHT TOTAL KNEE ARTHROPLASTY WITH COMPUTER NAVIGATION AND REMOVAL OF HARDWARE;  Surgeon: Samson Frederic, MD;  Location: [facility];  Service: Orthopedics;  Laterality: Right;  Adductor Block   KNEE ARTHROSCOPY W/ ACL RECONSTRUCTION Right 1996   KNEE CARTILAGE SURGERY Right age 27   LUMBAR FUSION  08/28/2020   MOHS SURGERY  02/10/2013   left  cheek -- [facility]   POSTERIOR REPAIR  05-21-2002   dr Emelda Fear   symptomatic recetocele   TONSILLECTOMY AND ADENOIDECTOMY  age 77   TRANSTHORACIC ECHOCARDIOGRAM  04-17-2016  dr Diona Browner   ef 60-65%/  trivial MR/ mild TR   VAGINAL HYSTERECTOMY  1979   w/  Bilateral Salpingoophorectomy    Allergies  Allergies  Allergen Reactions   Codeine Other (See Comments)    REACTION: syncope   Penicillins Rash    Can tolerate cephalosporins    History of Present Illness    [name] is a 82 y.o. female with a past medical history as  mentioned above.  Last seen by Joni Reining, NP on June 02, 2021.  Was overall doing well from a cardiac perspective.  BP was elevated, therefore losartan was increased to 100 mg daily by PCP.  Recommended to take blood pressure in left arm only due to right subclavian steal syndrome.  Today she presents for preoperative cardiovascular risk assessment.  Doing well from a cardiac perspective. Denies any chest pain, shortness of breath, palpitations, syncope, presyncope, dizziness, orthopnea, PND, swelling or significant weight changes, acute bleeding, or claudication.  Stays very active.  Admits to whitecoat hypertension.  Blood pressure log shows recent readings are overall well-controlled.  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  Enjoys doing yoga, hiking, and walking.  EKGs/Labs/Other Studies Reviewed:   The following studies were reviewed today:   EKG:  EKG is ordered today.  The ekg ordered today demonstrates sinus rhythm, 61 bpm, no acute ischemic changes.  Renal artery duplex on May 06, 2020: Right: Normal size right kidney. 1-59% stenosis of the right renal         artery. RRV flow present. Normal cortical thickness of right         kidney. Abnormal right Resistive Index.  Left:  Normal size of left kidney. Abnormal left Resistive Index. No         evidence of left renal artery stenosis. LRV flow present.         Normal cortical thickness of the left kidney.  Mesenteric:  Normal Superior Mesenteric artery findings. 70 to 99% stenosis in the  celiac artery.  Echocardiogram on April 29, 2020: 1. Left ventricular ejection fraction, by estimation, is 70 to 75%. The  left ventricle has hyperdynamic function. The left ventricle has no  regional wall motion abnormalities. Left ventricular diastolic parameters  were normal.   2. Right ventricular systolic function is normal. The right ventricular  size is normal. There is normal pulmonary artery systolic  pressure.   3. The mitral valve is normal in structure. Mild mitral valve  regurgitation.   4. The aortic valve is tricuspid. Aortic valve regurgitation is not  visualized. Mild aortic valve sclerosis is present, with no evidence of  aortic valve stenosis.   5. The inferior vena cava is normal in size with greater than 50%  respiratory variability, suggesting right atrial pressure of 3 mmHg.   Comparison(s): The left ventricular function is unchanged.  Bilateral carotid duplex on April 19, 2020: Summary:  Right Carotid: There is no evidence of stenosis in the right ICA. The                 extracranial vessels were near-normal with only minimal  wall                thickening or plaque. Decrease velocities in the RICA  compared to  prior exam.   Left Carotid: Velocities in the left ICA are consistent with a 1-39%  stenosis.               Decrease velocities in the LICA compared to prior exam.   Vertebrals:  Bilateral vertebral arteries demonstrate antegrade flow.  Subclavians: Normal flow hemodynamics were seen in the left subclavian  artery.              Monophasic flow noted in the right subclavian artery and  right              innominate suggesting a high grade stenosis more proximally.   Recent Labs: 06/28/2022: TSH 1.437 06/29/2022: ALT 22; Hemoglobin 13.3; Platelets 200 07/04/2022: BUN 8; Creatinine, Ser 0.60; Magnesium 1.9; Potassium 3.9; Sodium 129  Recent Lipid Panel    Component Value Date/Time   CHOL 180 05/28/2022 0810   CHOL 198 05/30/2021 0825   TRIG 43 05/28/2022 0810   HDL 97 05/28/2022 0810   HDL 102 05/30/2021 0825   CHOLHDL 1.9 05/28/2022 0810   VLDL 9 05/28/2022 0810   LDLCALC 74 05/28/2022 0810   LDLCALC 84 05/30/2021 0825   Home Medications   No outpatient medications have been marked as taking for the 07/06/22 encounter (Appointment) with Sharlene Dory, NP.     Review of Systems    All other systems reviewed and are  otherwise negative except as noted above.  Physical Exam    VS:  LMP  (LMP Unknown)  , BMI There is no height or weight on file to calculate BMI.  Wt Readings from Last 3 Encounters:  06/28/22 111 lb 5.3 oz (50.5 kg)  06/04/22 119 lb 0.8 oz (54 kg)  06/01/22 118 lb (53.5 kg)     GEN: Well nourished, well developed, in no acute distress. HEENT: normal. Neck: Supple, no JVD, carotid bruits, or masses. Cardiac: S1/S2, RRR, no murmurs, rubs, or gallops. No clubbing, cyanosis, edema.  Radials/PT 2+ and equal bilaterally.  Respiratory:  Respirations regular and unlabored, clear to auscultation bilaterally. GI: Soft, nontender, nondistended. MS: Arthritis noted to bilateral phalanges, mucoid cyst noted to left ring finger Skin: Thin skin, warm and dry, no rash. Neuro:  Strength and sensation are intact. Psych: Normal affect.  Assessment & Plan    Pre-operative cardiovascular risk assessment [name]'s perioperative risk of a major cardiac event is  % according to the Revised Cardiac Risk Index (RCRI).  Therefore, she is at low risk for perioperative complications.   Her functional capacity is excellent at   METs according to the Duke Activity Status Index (DASI). Recommendations: According to ACC/AHA guidelines, no further cardiovascular testing needed.  The patient may proceed to surgery at acceptable risk.   Antiplatelet and/or Anticoagulation Recommendations: Prefer aspirin to be continued perioperatively unless surgeon feels bleeding risk is too high.  If bleeding risk is felt to be too high, okay to hold aspirin 7 days prior to procedure.  Does not require SBE prophylaxis.  Will route note to requesting party.  2.  Hypertension Blood pressure on arrival 164/98, repeat BP 142/80.  Admits to whitecoat hypertension.  BP log shows overall well-controlled blood pressures. Discussed to monitor BP at home at least 2 hours after medications and sitting for 5-10 minutes.  Encouraged to  continue to log blood pressures.  Discussed with her if SBP consistently greater than 140, to notify office for titration of antihypertensives.  If blood pressure not controlled in future, recommend considering chlorthalidone initiation.  Continue current medical management. Heart healthy diet and regular cardiovascular exercise encouraged.   3.  Carotid artery stenosis, right subclavian steal, renal artery stenosis, celiac artery stenosis (PAD) No evidence of carotid artery stenosis on the right, 1 to 39% stenosis along the left ICA.  Monophasic flow noted to right subclavian artery, suggestive of high-grade stenosis more proximally. No BP on right arm.  Denies any concerning/red flag symptoms.  1-59% stenosis of right renal artery, no RAS noted to the left renal artery.  70-99% stenosis along celiac artery.  Denies any symptoms, continue current medication regimen.  Will continue to monitor. Heart healthy diet and regular cardiovascular exercise encouraged.  If symptoms were to develop, consider referral to [facility]. ED precautions discussed.    Disposition: Follow up in 1 year(s) with Nona Dell, MD or APP.  Signed, Sharlene Dory, NP 07/06/2022, 8:39 AM [facility]

## 2022-07-09 ENCOUNTER — Other Ambulatory Visit: Payer: Self-pay

## 2022-07-09 ENCOUNTER — Ambulatory Visit (HOSPITAL_COMMUNITY): Payer: Medicare Other | Admitting: Occupational Therapy

## 2022-07-09 ENCOUNTER — Ambulatory Visit (HOSPITAL_COMMUNITY): Payer: Medicare Other | Attending: Nurse Practitioner | Admitting: Occupational Therapy

## 2022-07-09 ENCOUNTER — Encounter (HOSPITAL_COMMUNITY): Payer: Self-pay | Admitting: Occupational Therapy

## 2022-07-09 DIAGNOSIS — R29898 Other symptoms and signs involving the musculoskeletal system: Secondary | ICD-10-CM | POA: Diagnosis not present

## 2022-07-09 DIAGNOSIS — M25511 Pain in right shoulder: Secondary | ICD-10-CM | POA: Diagnosis not present

## 2022-07-09 DIAGNOSIS — H0279 Other degenerative disorders of eyelid and periocular area: Secondary | ICD-10-CM | POA: Diagnosis not present

## 2022-07-09 DIAGNOSIS — H02532 Eyelid retraction right lower eyelid: Secondary | ICD-10-CM | POA: Diagnosis not present

## 2022-07-09 DIAGNOSIS — H02135 Senile ectropion of left lower eyelid: Secondary | ICD-10-CM | POA: Diagnosis not present

## 2022-07-09 DIAGNOSIS — H04523 Eversion of bilateral lacrimal punctum: Secondary | ICD-10-CM | POA: Diagnosis not present

## 2022-07-09 DIAGNOSIS — H02115 Cicatricial ectropion of left lower eyelid: Secondary | ICD-10-CM | POA: Diagnosis not present

## 2022-07-09 DIAGNOSIS — H16213 Exposure keratoconjunctivitis, bilateral: Secondary | ICD-10-CM | POA: Diagnosis not present

## 2022-07-09 DIAGNOSIS — H04123 Dry eye syndrome of bilateral lacrimal glands: Secondary | ICD-10-CM | POA: Diagnosis not present

## 2022-07-09 DIAGNOSIS — H02535 Eyelid retraction left lower eyelid: Secondary | ICD-10-CM | POA: Diagnosis not present

## 2022-07-09 DIAGNOSIS — H04522 Eversion of left lacrimal punctum: Secondary | ICD-10-CM | POA: Diagnosis not present

## 2022-07-09 DIAGNOSIS — H02132 Senile ectropion of right lower eyelid: Secondary | ICD-10-CM | POA: Diagnosis not present

## 2022-07-09 DIAGNOSIS — H02112 Cicatricial ectropion of right lower eyelid: Secondary | ICD-10-CM | POA: Diagnosis not present

## 2022-07-09 DIAGNOSIS — H04521 Eversion of right lacrimal punctum: Secondary | ICD-10-CM | POA: Diagnosis not present

## 2022-07-09 NOTE — Therapy (Signed)
OUTPATIENT OCCUPATIONAL THERAPY ORTHO EVALUATION  Patient Name: [name] MRN: [id number] DOB:[birth year], 82 y.o., female Today's Date: 07/09/2022   END OF SESSION:  OT End of Session - 07/09/22 1610     Visit Number 1    Number of Visits 5    Date for OT Re-Evaluation 08/08/22    Authorization Type [employer] Medicare, $20 copay    Progress Note Due on Visit 10    OT Start Time 1530    OT Stop Time 1600    OT Time Calculation (min) 30 min    Activity Tolerance Patient tolerated treatment well    Behavior During Therapy [facility] for tasks assessed/performed             Past Medical History:  Diagnosis Date   Arthritis    Basal cell carcinoma 04/29/2019   right inner cheek [facility])   Basal cell carcinoma 04/29/2019   left side nose (MO[facility])   Chronic kidney disease    Diverticulosis of colon    Essential hypertension    History of basal cell carcinoma (BCC) excision    02-10-2013  left cheek  s/p  moh's sx   History of palpitations    History of recurrent UTIs    History of syncope    2015-- felt to vasovagal response to pain (per cardiologist note)   IBS (irritable bowel syndrome)    Open wound of left knee    warm soaks daily /  neosprin and dressing daily / per per still has drainage   Patellar bursitis of left knee    pre-patella septic bursitis w/ foreign body   Right thyroid nodule    x2 right side incidental finding on carotid duplex 03/ 2018-- thyroid ultrasound done , benign , follow-up in one year   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/29/20201   in situ-right upper arm-anterior   Squamous cell carcinoma of skin 11/04/2019   KA-right lower leg-anterior superior   Wears hearing aid in both ears    Past Surgical History:  Procedure Laterality Date   APPENDECTOMY  age 75   BREAST BIOPSY  1962   benign   CATARACT EXTRACTION W/PHACO  06/25/2011   Procedure: CATARACT EXTRACTION PHACO AND INTRAOCULAR LENS PLACEMENT (IOC);  Surgeon: Susa Simmonds, MD;  Location:  [facility];  Service: Ophthalmology;  Laterality: Right;  CDE: 8.90   CATARACT EXTRACTION W/PHACO Left 05/05/2012   Procedure: CATARACT EXTRACTION PHACO AND INTRAOCULAR LENS PLACEMENT (IOC);  Surgeon: Susa Simmonds, MD;  Location: [facility];  Service: Ophthalmology;  Laterality: Left;  CDE 12.78   COLONOSCOPY  last one 11-26-2012   CYST EXCISION Left 04/16/2022   Procedure: LEFT RING FINGER EXCISION MUCOID CYST AND DISTAL INTERPHALANGEAL JOINT DEBRIDEMENT;  Surgeon: Betha Loa, MD;  Location: [facility];  Service: Orthopedics;  Laterality: Left;   EXCISION MORTON'S NEUROMA Right 2003  approx.   INCISION AND DRAINAGE WOUND WITH FOREIGN BODY REMOVAL Left 08/30/2016   Procedure: LEFT KNEE INCISION AND DRAINAGE WOUND WITH FOREIGN BODY REMOVAL;  Surgeon: Eugenia Mcalpine, MD;  Location: [facility];  Service: Orthopedics;  Laterality: Left;   KNEE ARTHROPLASTY Right 03/14/2017   Procedure: RIGHT TOTAL KNEE ARTHROPLASTY WITH COMPUTER NAVIGATION AND REMOVAL OF HARDWARE;  Surgeon: Samson Frederic, MD;  Location: [facility];  Service: Orthopedics;  Laterality: Right;  Adductor Block   KNEE ARTHROSCOPY W/ ACL RECONSTRUCTION Right 1996   KNEE CARTILAGE SURGERY Right age 31   LUMBAR FUSION  08/28/2020  MOHS SURGERY  02/10/2013   left cheek -- 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 Everlene Other  REFERRING PROVIDER: Dr. Ramond Marrow  ONSET DATE: 06/04/22  REFERRING DIAG: Right partial RC Tear  THERAPY DIAG:  Acute pain of right shoulder  Other symptoms and signs involving the musculoskeletal system  Rationale for Evaluation and Treatment: Rehabilitation  SUBJECTIVE:   SUBJECTIVE STATEMENT: S: I had a terrible fall.  Pt accompanied by: self  PERTINENT HISTORY: Pt is an 82 y/o female who had a fall when trying to get to the restroom overnight. Pt had an MRI which shows a partial RC tear.   PRECAUTIONS: None  WEIGHT BEARING RESTRICTIONS: No  PAIN:  Are you having pain? No  FALLS: Has patient fallen in last 6 months? Yes. Number of falls 2  PLOF: Independent  PATIENT GOALS: To have more strength and improved use of the right arm.   NEXT MD VISIT: In about a month-no appt made yet  OBJECTIVE:   HAND DOMINANCE: Left  ADLs: Overall ADLs: Pt has difficulty with overhead reaching and lifting. Pt reports some pain, more at night, tries not to roll over on the right side.   FUNCTIONAL OUTCOME MEASURES: FOTO: 59/100  UPPER EXTREMITY ROM:       Assessed in sitting, er/IR adducted  Active ROM Right eval  Shoulder flexion 135  Shoulder abduction 153  Shoulder internal rotation 90  Shoulder external rotation 90  (Blank rows = not tested)    UPPER EXTREMITY MMT:     Assessed in sitting, er/IR adducted  MMT Right eval  Shoulder flexion 5/5  Shoulder abduction 4-/5  Shoulder internal rotation 5/5  Shoulder external rotation 4+/5  (Blank rows = not tested)   COGNITION: Overall cognitive status: Within functional limits for tasks assessed  OBSERVATIONS: min/mod fascial restrictions in right upper arm, anterior shoulder, and trapezius regions   TODAY'S TREATMENT:                                                                                                                              DATE:  N/A-eval only     PATIENT EDUCATION: Education details: shoulder stretches, A/ROM Person educated: Patient Education method: Explanation, Demonstration, and Handouts Education comprehension: verbalized understanding and returned  demonstration  HOME EXERCISE PROGRAM: 6/3: shoulder stretches, A/ROM  GOALS: Goals reviewed with patient? Yes   SHORT TERM GOALS: Target date: 08/07/22  Pt will be provided with and educated on HEP to improve mobility in RUE required for use during ADL completion.   Goal status: INITIAL  2.  Pt will increase RUE A/ROM by 10 degrees flexion to improve ability to use RUE during dressing tasks with minimal compensatory techniques.   Goal status: INITIAL  3.  Pt will increase RUE strength to 4+/5 throughout to improve ability to use RUE during strenuous tasks like kayaking and yardwork.  Goal status: INITIAL  4.  Pt will decrease pain in RUE to 2/10 or less to improve ability to use RUE for housework and yardwork tasks.   Goal status: INITIAL  Pt will decrease RUE fascial restrictions to min amounts or less to improve mobility required for functional reaching tasks.   Goal status: INITIAL    ASSESSMENT:  CLINICAL IMPRESSION: Patient is a 82 y.o. female who was seen today for occupational therapy evaluation for right RC tear. Pt presents with increased pain and fascial restrictions, decreased ROM, strength, and functional use of the RUE. Pt has been working on her own exercises slowly and carefully and demonstrates improved ROM compared to the initial injury. Pt continues to have pain and soreness intermittently in her RUE, as well as decreased ROM in the flexion plane, and decreased strength for abduction and er. Pt is limited in her kayaking, yardwork, and housework activities requiring lifting, pulling, or increased stress on the RUE.   PERFORMANCE DEFICITS: in functional skills including in functional skills including ADLs, IADLs, coordination, tone,  ROM, strength, pain, fascial restrictions, muscle spasms, and UE functional use  IMPAIRMENTS: are limiting patient from ADLs, IADLs, and leisure.   COMORBIDITIES: may have co-morbidities  that affects occupational performance. Patient will benefit from skilled OT to address above impairments and improve overall function.  MODIFICATION OR ASSISTANCE TO COMPLETE EVALUATION: No modification of tasks or assist necessary to complete an evaluation.  OT OCCUPATIONAL PROFILE AND HISTORY: Problem focused assessment: Including review of records relating to presenting problem.  CLINICAL DECISION MAKING: LOW - limited treatment options, no task modification necessary  REHAB POTENTIAL: Good  EVALUATION COMPLEXITY: Low      PLAN:  OT FREQUENCY: 1x/week  OT DURATION: 4 weeks  PLANNED INTERVENTIONS: self care/ADL training, therapeutic exercise, therapeutic activity, neuromuscular re-education, manual therapy, passive range of motion, splinting, electrical stimulation, ultrasound, moist heat, cryotherapy, patient/family education, and DME and/or AE instructions  RECOMMENDED OTHER SERVICES: None  CONSULTED AND AGREED WITH PLAN OF CARE: Patient  PLAN FOR NEXT SESSION: Follow up on HEP, begin strengthening for shoulder and scapular stability    Ezra Sites, OTR/L  [phone] 07/09/2022, 4:11 PM

## 2022-07-09 NOTE — Patient Instructions (Signed)
  1) Flexion Wall Stretch    Face wall, place affected handon wall in front of you. Slide hand up the wall  and lean body in towards the wall. Hold for 10 seconds. Repeat 3-5 times. 1-2 times/day.     2) Corner Stretch    Stand at a corner of a wall, place your arms on the walls with elbows bent. Lean into the corner until a stretch is felt along the front of your chest and/or shoulders. Hold for 10 seconds. Repeat 3-5X, 1-2 times/day.    3) Posterior Capsule Stretch    Bring the involved arm across chest. Grasp elbow and pull toward chest until you feel a stretch in the back of the upper arm and shoulder. Hold 10 seconds. Repeat 3-5X. Complete 1-2 times/day.    ROM Exercises: Repeat all exercises 10-15 times, 1-2 times per day.  1) Shoulder Protraction    Begin with elbows by your side, slowly "punch" straight out in front of you.      2) Shoulder Flexion  Standing:         Begin with arms at your side with thumbs pointed up, slowly raise both arms up and forward towards overhead.               3) Horizontal abduction/adduction  Standing:           Begin with arms straight out in front of you, bring out to the side in at "T" shape. Keep arms straight entire time.                 4) Internal & External Rotation  Standing:   Stand with elbows at the side and elbows bent 90 degrees. Move your forearms away from your body, then bring back inward toward the body.     5) Shoulder Abduction  Supine:     Standing:       Slowly move your arms out to the side so that they go overhead, in a jumping jack or snow angel movement.

## 2022-07-10 ENCOUNTER — Other Ambulatory Visit: Payer: Self-pay | Admitting: *Deleted

## 2022-07-10 ENCOUNTER — Ambulatory Visit (INDEPENDENT_AMBULATORY_CARE_PROVIDER_SITE_OTHER): Payer: Medicare Other | Admitting: Family Medicine

## 2022-07-10 ENCOUNTER — Encounter (HOSPITAL_COMMUNITY): Payer: Self-pay

## 2022-07-10 ENCOUNTER — Other Ambulatory Visit: Payer: Self-pay

## 2022-07-10 ENCOUNTER — Encounter (HOSPITAL_COMMUNITY): Payer: Medicare Other | Admitting: Occupational Therapy

## 2022-07-10 ENCOUNTER — Other Ambulatory Visit (HOSPITAL_COMMUNITY)
Admission: RE | Admit: 2022-07-10 | Discharge: 2022-07-10 | Disposition: A | Discharge status: Home / Self Care | Payer: Medicare Other | Source: Ambulatory Visit | Attending: Family Medicine | Admitting: Family Medicine

## 2022-07-10 VITALS — BP 146/88 | HR 73 | Ht 60.0 in | Wt 112.0 lb

## 2022-07-10 DIAGNOSIS — I10 Essential (primary) hypertension: Secondary | ICD-10-CM

## 2022-07-10 DIAGNOSIS — E876 Hypokalemia: Secondary | ICD-10-CM

## 2022-07-10 DIAGNOSIS — E042 Nontoxic multinodular goiter: Secondary | ICD-10-CM

## 2022-07-10 DIAGNOSIS — E871 Hypo-osmolality and hyponatremia: Secondary | ICD-10-CM

## 2022-07-10 LAB — BASIC METABOLIC PANEL
Anion gap: 7 (ref 5–15)
BUN: 9 mg/dL (ref 8–23)
CO2: 30 mmol/L (ref 22–32)
Calcium: 9.6 mg/dL (ref 8.9–10.3)
Chloride: 90 mmol/L — ABNORMAL LOW (ref 98–111)
Creatinine, Ser: 0.67 mg/dL (ref 0.44–1.00)
GFR, Estimated: >60 mL/min (ref >60)
Glucose, Bld: 98 mg/dL (ref 70–99)
Potassium: 3.8 mmol/L (ref 3.5–5.1)
Sodium: 127 mmol/L — ABNORMAL LOW (ref 135–145)

## 2022-07-10 NOTE — Progress Notes (Signed)
Subjective:  Patient ID: [name], female    DOB: 05-25-1940  Age: 82 y.o. MRN: [id number]  CC: Chief Complaint  Patient presents with   Hospitalization Follow-up    Fall- head, neck and shoulder injury Concussion, hyponatremia, confusion, vomiting, right rotator cuff tear    HPI:  82 year old female with hypertension, multinodular goiter, hyperlipidemia, osteopenia, GERD presents for hospital follow-up.  Patient recently admitted from 5/23 to 5/24.  She had suffered a fall prior to admission.  She had imaging which was unremarkable.  She presented with feeling as if she was in a brain fog.  She was found to be hyponatremic with a sodium of 126.  She was treated with fluids and electrolytes were stabilized.  She was subsequently discharged home in stable condition.  Of note, it was recommended that she have follow-up imaging regarding toxic multinodular goiter.  Patient presents today for follow-up.  She states that she is still not back to her baseline.  She still feels "foggy".  She is undergoing physical therapy for rotator cuff tear.  She needs a repeat evaluation of her sodium today.  The discharge summary recommended that she stop HCTZ but she is taking the medication currently.  She is also taking losartan and metoprolol for hypertension.  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   Essential hypertension 03/25/2020   S/P knee surgery 08/30/2016   History of recurrent UTIs 09/19/2015   Osteopenia 05/21/2015    Social Hx   Social History   Socioeconomic History   Marital status: Married    Spouse name: Not on file   Number of children: 2   Years of education: Not on file   Highest education level: Not on file  Occupational History    Occupation: retired  Tobacco Use   Smoking status: Former    Packs/day: 0.50    Years: 10.00    Additional pack years: 0.00    Total pack years: 5.00    Types: Cigarettes    Quit date: 04/30/1962    Years since quitting: 60.2   Smokeless tobacco: Never  Vaping Use   Vaping Use: Never used  Substance and Sexual Activity   Alcohol use: Yes    Alcohol/week: 14.0 standard drinks of alcohol    Types: 14 Glasses of wine per week    Comment: 2 wine daily   Drug use: No   Sexual activity: Yes    Birth control/protection: Surgical  Other Topics Concern   Not on file  Social History Narrative   Not on file   Social Determinants of Health   Financial Resource Strain: Not on file  Food Insecurity: No Food Insecurity (06/28/2022)   Hunger Vital Sign    Worried About Running Out of Food in the Last Year: Never true    Ran Out of Food in the Last Year: Never true  Transportation Needs: No Transportation Needs (06/28/2022)   PRAPARE - Administrator, Civil Service (Medical): No    Lack of Transportation (Non-Medical): No  Physical Activity: Not on file  Stress: Not on file  Social Connections: Not on file    Review of Systems Per HPI  Objective:  BP (!) 146/88   Pulse 73   Ht 5' (1.524 m)   [employer]  112 lb (50.8 kg)   LMP  (LMP Unknown)   SpO2 100%   BMI 21.87 kg/m      07/10/2022   10:50 AM 07/06/2022   10:52 AM 06/29/2022    9:44 AM  BP/Weight  Systolic BP 146 180 140  Diastolic BP 88 100 85  Wt. (Lbs) 112 113   BMI 21.87 kg/m2 22.07 kg/m2     Physical Exam Vitals and nursing note reviewed.  Constitutional:      General: She is not in acute distress.    Appearance: Normal appearance.  HENT:     Head: Normocephalic and atraumatic.  Cardiovascular:     Rate and Rhythm: Normal rate and regular rhythm.  Pulmonary:     Effort: Pulmonary effort is normal.     Breath sounds: Normal breath sounds.  Neurological:     General: No focal deficit present.      Mental Status: She is alert.  Psychiatric:        Mood and Affect: Mood normal.        Behavior: Behavior normal.     Lab Results  Component Value Date   WBC 5.2 06/29/2022   HGB 13.3 06/29/2022   HCT 38.6 06/29/2022   PLT 200 06/29/2022   GLUCOSE 98 07/10/2022   CHOL 180 05/28/2022   TRIG 43 05/28/2022   HDL 97 05/28/2022   LDLCALC 74 05/28/2022   ALT 22 06/29/2022   AST 20 06/29/2022   NA 127 (L) 07/10/2022   K 3.8 07/10/2022   CL 90 (L) 07/10/2022   CREATININE 0.67 07/10/2022   BUN 9 07/10/2022   CO2 30 07/10/2022   TSH 1.437 06/28/2022   INR 0.97 02/26/2017     Assessment & Plan:   Problem List Items Addressed This Visit       Cardiovascular and Mediastinum   Essential hypertension    Fair control given age.  Continue losartan and metoprolol.  May need to discontinue HCTZ if hyponatremia continues to be problematic.        Endocrine   Multinodular goiter (nontoxic)    Ultrasound ordered for evaluation.      Relevant Orders   US THYROID     Other   Acute hyponatremia - Primary    Sodium returned at 127.  Stopping HCTZ.  Repeat metabolic panel in 1 week.  Avoid excessive hydration.       Relevant Orders   Basic Metabolic Panel   Follow-up:  Return in about 1 month (around 08/09/2022).  Everlene Other DO [facility]

## 2022-07-10 NOTE — Assessment & Plan Note (Signed)
Fair control given age.  Continue losartan and metoprolol.  May need to discontinue HCTZ if hyponatremia continues to be problematic.

## 2022-07-10 NOTE — Assessment & Plan Note (Addendum)
Sodium returned at 127.  Stopping HCTZ.  Repeat metabolic panel in 1 week.  Avoid excessive hydration.

## 2022-07-10 NOTE — Patient Instructions (Signed)
Lab today.  [place] ordered.  Follow up in 1 month.

## 2022-07-10 NOTE — Assessment & Plan Note (Signed)
Ultrasound ordered for evaluation  

## 2022-07-13 ENCOUNTER — Ambulatory Visit (HOSPITAL_COMMUNITY)
Admission: RE | Admit: 2022-07-13 | Discharge: 2022-07-13 | Disposition: A | Discharge status: Home / Self Care | Payer: Medicare Other | Source: Ambulatory Visit | Attending: Family Medicine | Admitting: Family Medicine

## 2022-07-13 DIAGNOSIS — E042 Nontoxic multinodular goiter: Secondary | ICD-10-CM | POA: Diagnosis not present

## 2022-07-13 DIAGNOSIS — E041 Nontoxic single thyroid nodule: Secondary | ICD-10-CM | POA: Diagnosis not present

## 2022-07-17 DIAGNOSIS — E876 Hypokalemia: Secondary | ICD-10-CM | POA: Diagnosis not present

## 2022-07-18 ENCOUNTER — Ambulatory Visit (HOSPITAL_COMMUNITY): Payer: Medicare Other | Admitting: Occupational Therapy

## 2022-07-18 ENCOUNTER — Encounter (HOSPITAL_COMMUNITY): Payer: Self-pay | Admitting: Occupational Therapy

## 2022-07-18 DIAGNOSIS — M25511 Pain in right shoulder: Secondary | ICD-10-CM | POA: Diagnosis not present

## 2022-07-18 DIAGNOSIS — R29898 Other symptoms and signs involving the musculoskeletal system: Secondary | ICD-10-CM

## 2022-07-18 LAB — BASIC METABOLIC PANEL
BUN/Creatinine Ratio: 11 — ABNORMAL LOW (ref 12–28)
BUN: 8 mg/dL (ref 8–27)
CO2: 25 mmol/L (ref 20–29)
Calcium: 10.1 mg/dL (ref 8.7–10.3)
Chloride: 93 mmol/L — ABNORMAL LOW (ref 96–106)
Creatinine, Ser: 0.74 mg/dL (ref 0.57–1.00)
Glucose: 98 mg/dL (ref 70–99)
Potassium: 4.2 mmol/L (ref 3.5–5.2)
Sodium: 133 mmol/L — ABNORMAL LOW (ref 134–144)
eGFR: 80 mL/min/{1.73_m2} (ref >59)

## 2022-07-18 NOTE — Therapy (Signed)
OUTPATIENT OCCUPATIONAL THERAPY ORTHO TREATMENT  Patient Name: [name] MRN: [id number] DOB:05/19/1940, 82 y.o., female Today's Date: 07/18/2022   END OF SESSION:  OT End of Session - 07/18/22 1033     Visit Number 2    Number of Visits 5    Date for OT Re-Evaluation 08/08/22    Authorization Type [employer] Medicare, $20 copay    Progress Note Due on Visit 10    OT Start Time 1034    OT Stop Time 1115    OT Time Calculation (min) 41 min    Activity Tolerance Patient tolerated treatment well    Behavior During Therapy [facility] for tasks assessed/performed             Past Medical History:  Diagnosis Date   Arthritis    Basal cell carcinoma 04/29/2019   right inner cheek [facility])   Basal cell carcinoma 04/29/2019   left side nose (MOHs)   Chronic kidney disease    Diverticulosis of colon    Essential hypertension    History of basal cell carcinoma (BCC) excision    02-10-2013  left cheek  s/p  moh's sx   History of palpitations    History of recurrent UTIs    History of syncope    2015-- felt to vasovagal response to pain (per cardiologist note)   IBS (irritable bowel syndrome)    Open wound of left knee    warm soaks daily /  neosprin and dressing daily / per per still has drainage   Patellar bursitis of left knee    pre-patella septic bursitis w/ foreign body   Right thyroid nodule    x2 right side incidental finding on carotid duplex 03/ 2018-- thyroid ultrasound done , benign , follow-up in one year   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/29/20201   in situ-right upper arm-anterior   Squamous cell carcinoma of skin 11/04/2019   KA-right lower leg-anterior superior   Wears hearing aid in both ears    Past Surgical History:  Procedure Laterality Date   APPENDECTOMY  age 89   BREAST BIOPSY  1962   benign   CATARACT EXTRACTION W/PHACO  06/25/2011   Procedure: CATARACT EXTRACTION PHACO AND INTRAOCULAR LENS PLACEMENT (IOC);  Surgeon: Susa Simmonds, MD;  [facility];  Service: Ophthalmology;  Laterality: Right;  CDE: 8.90   CATARACT EXTRACTION W/PHACO Left 05/05/2012   Procedure: CATARACT EXTRACTION PHACO AND INTRAOCULAR LENS PLACEMENT (IOC);  Surgeon: Susa Simmonds, MD;  Location: [facility];  Service: Ophthalmology;  Laterality: Left;  CDE 12.78   COLONOSCOPY  last one 11-26-2012   CYST EXCISION Left 04/16/2022   Procedure: LEFT RING FINGER EXCISION MUCOID CYST AND DISTAL INTERPHALANGEAL JOINT DEBRIDEMENT;  Surgeon: Betha Loa, MD;  Location: [facility];  Service: Orthopedics;  Laterality: Left;   EXCISION MORTON'S NEUROMA Right 2003  approx.   INCISION AND DRAINAGE WOUND WITH FOREIGN BODY REMOVAL Left 08/30/2016   Procedure: LEFT KNEE INCISION AND DRAINAGE WOUND WITH FOREIGN BODY REMOVAL;  Surgeon: Eugenia Mcalpine, MD;  Location: [facility];  Service: Orthopedics;  Laterality: Left;   KNEE ARTHROPLASTY Right 03/14/2017   Procedure: RIGHT TOTAL KNEE ARTHROPLASTY WITH COMPUTER NAVIGATION AND REMOVAL OF HARDWARE;  Surgeon: Samson Frederic, MD;  Location: [facility];  Service: Orthopedics;  Laterality: Right;  Adductor Block   KNEE ARTHROSCOPY W/ ACL RECONSTRUCTION Right 1996   KNEE CARTILAGE SURGERY Right age 64   LUMBAR FUSION  08/28/2020  MOHS SURGERY  02/10/2013   left cheek -- [facility]   POSTERIOR REPAIR  05-21-2002   dr Emelda Fear   symptomatic recetocele   TONSILLECTOMY AND ADENOIDECTOMY  age 83   TRANSTHORACIC ECHOCARDIOGRAM  04-17-2016  dr Diona Browner   ef 60-65%/  trivial MR/ mild TR   VAGINAL HYSTERECTOMY  1979   w/  Bilateral Salpingoophorectomy   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 Everlene Other  REFERRING PROVIDER: Dr. Ramond Marrow  ONSET DATE: 06/04/22  REFERRING DIAG: Right partial RC Tear  THERAPY DIAG:  Acute pain of right shoulder  Other symptoms and signs involving the musculoskeletal system  Rationale for Evaluation and Treatment: Rehabilitation  SUBJECTIVE:   SUBJECTIVE STATEMENT: S: "I think you'll be surprised with my mobility."  PERTINENT HISTORY: Pt is an 82 y/o female who had a fall when trying to get to the restroom overnight. Pt had an MRI which shows a partial RC tear.   PRECAUTIONS: None  WEIGHT BEARING RESTRICTIONS: No  PAIN:  Are you having pain? No  FALLS: Has patient fallen in last 6 months? Yes. Number of falls 2  PLOF: Independent  PATIENT GOALS: To have more strength and improved use of the right arm.   NEXT MD VISIT: In about a month-no appt made yet  OBJECTIVE:   HAND DOMINANCE: Left  ADLs: Overall ADLs: Pt has difficulty with overhead reaching and lifting. Pt reports some pain, more at night, tries not to roll over on the right side.   FUNCTIONAL OUTCOME MEASURES: FOTO: 59/100  UPPER EXTREMITY ROM:       Assessed in sitting, er/IR adducted  Active ROM Right eval  Shoulder flexion 135  Shoulder abduction 153  Shoulder internal rotation 90  Shoulder external rotation 90  (Blank rows = not tested)    UPPER EXTREMITY MMT:     Assessed in sitting, er/IR adducted  MMT Right eval  Shoulder flexion 5/5  Shoulder abduction 4-/5  Shoulder internal rotation 5/5  Shoulder external rotation 4+/5  (Blank rows = not tested)   OBSERVATIONS: min/mod fascial restrictions in right upper arm, anterior shoulder, and trapezius regions   TODAY'S TREATMENT:                                                                                                                              DATE:  07/18/22 -Myofascial release to right upper arm, anterior shoulder, trapezius  regions to decrease pain and fascial restrictions and improve joint ROM -P/ROM: supine-flexion, abduction, er/IR, horizontal abduction, 5 reps -Strengthening: supine, 2#-protraction, flexion, horizontal abduction, er/IR, 1# abduction, 10 reps -  Strengthening: standing, 1#-protraction, flexion, horizontal abduction, er/IR, abduction, 10 reps   PATIENT EDUCATION: Education details: strengthening-add 1 or 2# weight to A/ROM Person educated: Patient Education method: Programmer, multimedia, Facilities manager, and Handouts Education comprehension: verbalized understanding and returned demonstration  HOME EXERCISE PROGRAM: 6/3: shoulder stretches, A/ROM 6/12: add 1 or 2# weight to A/ROM for strengthening  GOALS: Goals reviewed with patient? Yes   SHORT TERM GOALS: Target date: 08/07/22  Pt will be provided with and educated on HEP to improve mobility in RUE required for use during ADL completion.   Goal status: IN PROGRESS  2.  Pt will increase RUE A/ROM by 10 degrees flexion to improve ability to use RUE during dressing tasks with minimal compensatory techniques.   Goal status: IN PROGRESS  3.  Pt will increase RUE strength to 4+/5 throughout to improve ability to use RUE during strenuous tasks like kayaking and yardwork.  Goal status: IN PROGRESS  4.  Pt will decrease pain in RUE to 2/10 or less to improve ability to use RUE for housework and yardwork tasks.   Goal status: IN PROGRESS  Pt will decrease RUE fascial restrictions to min amounts or less to improve mobility required for functional reaching tasks.   Goal status: IN PROGRESS    ASSESSMENT:  CLINICAL IMPRESSION: Pt reports she has been completing her HEP and her mobility has improved. Initiated manual techniques to address fascial restrictions. Pt with ROM WNL during passive stretching, progressed to strengthening with pt using 1 and 2# weights in supine and standing as was able to maintain good form. Pt completing scapular  theraband today. Updated HEP for strengthening, verbal cuing for form and technique during session.   PERFORMANCE DEFICITS: in functional skills including in functional skills including ADLs, IADLs, coordination, tone, ROM, strength, pain, fascial restrictions, muscle spasms, and UE functional use   PLAN:  OT FREQUENCY: 1x/week  OT DURATION: 4 weeks  PLANNED INTERVENTIONS: self care/ADL training, therapeutic exercise, therapeutic activity, neuromuscular re-education, manual therapy, passive range of motion, splinting, electrical stimulation, ultrasound, moist heat, cryotherapy, patient/family education, and DME and/or AE instructions  CONSULTED AND AGREED WITH PLAN OF CARE: Patient  PLAN FOR NEXT SESSION: Follow up on HEP, continue with strengthening and activity tolerance, update HEP for scapular theraband   Ezra Sites, OTR/L  [phone] 07/18/2022, 11:43 AM

## 2022-07-23 ENCOUNTER — Telehealth: Payer: Self-pay | Admitting: Nurse Practitioner

## 2022-07-23 DIAGNOSIS — I10 Essential (primary) hypertension: Secondary | ICD-10-CM

## 2022-07-23 DIAGNOSIS — E871 Hypo-osmolality and hyponatremia: Secondary | ICD-10-CM

## 2022-07-23 NOTE — Telephone Encounter (Signed)
Reports she and her husband are currently traveling and was in a bad area causing previous phone call to be disconnected. Reports she is wants treatment for her hyponatremia and doesn't feel that her PCP is helping her. Requesting that [name] treat her for hyponatremia. Advised that she could transfer to a different PCP for treatment of hyponatremia if she is unhappy with current provider. Request a referral to [facility]. Advised that she could contact them directly for appointment. Advised that message would be sent to provider per her request.

## 2022-07-23 NOTE — Telephone Encounter (Signed)
Pt called in asking to speak to Lanora Manis, NP about some medication changes made by the hospital. She wants to make sure they are all still on the same page.

## 2022-07-23 NOTE — Telephone Encounter (Signed)
Referral placed.

## 2022-07-24 ENCOUNTER — Telehealth: Payer: Self-pay | Admitting: Nurse Practitioner

## 2022-07-24 NOTE — Telephone Encounter (Signed)
LMOVM - REGARDING- Reports she and her husband are currently traveling and was in a bad area causing previous phone call to be disconnected. Reports she is wants treatment for her hyponatremia and doesn't feel that her PCP is helping her. Requesting that Lanora Manis treat her for hyponatremia. Advised that she could transfer to a different PCP for treatment of hyponatremia if she is unhappy with current provider. Request a referral to [facility]. Advised that she could contact them directly for appointment. Advised that message would be sent to provider per her request.

## 2022-07-25 ENCOUNTER — Ambulatory Visit (HOSPITAL_COMMUNITY): Payer: Medicare Other

## 2022-07-25 ENCOUNTER — Encounter (HOSPITAL_COMMUNITY): Payer: Medicare Other | Admitting: Occupational Therapy

## 2022-07-25 ENCOUNTER — Encounter (HOSPITAL_COMMUNITY): Payer: Self-pay

## 2022-07-27 ENCOUNTER — Ambulatory Visit (HOSPITAL_COMMUNITY): Payer: Medicare Other | Admitting: Occupational Therapy

## 2022-07-27 DIAGNOSIS — M25511 Pain in right shoulder: Secondary | ICD-10-CM

## 2022-07-27 DIAGNOSIS — R29898 Other symptoms and signs involving the musculoskeletal system: Secondary | ICD-10-CM

## 2022-07-27 NOTE — Therapy (Signed)
OUTPATIENT OCCUPATIONAL THERAPY ORTHO TREATMENT  Patient Name: [name] MRN: [id number] DOB:[birth year], 82 y.o., female Today's Date: 07/27/2022   END OF SESSION:  OT End of Session - 07/27/22 2152     Visit Number 3    Number of Visits 5    Date for OT Re-Evaluation 08/08/22    Authorization Type [employer] Medicare, $20 copay    Progress Note Due on Visit 10    OT Start Time 1434    OT Stop Time 1514    OT Time Calculation (min) 40 min    Activity Tolerance Patient tolerated treatment well    Behavior During Therapy [facility] for tasks assessed/performed              Past Medical History:  Diagnosis Date   Arthritis    Basal cell carcinoma 04/29/2019   right inner cheek [facility])   Basal cell carcinoma 04/29/2019   left side nose (MOHs)   Chronic kidney disease    Diverticulosis of colon    Essential hypertension    History of basal cell carcinoma (BCC) excision    02-10-2013  left cheek  s/p  moh's sx   History of palpitations    History of recurrent UTIs    History of syncope    2015-- felt to vasovagal response to pain (per cardiologist note)   IBS (irritable bowel syndrome)    Open wound of left knee    warm soaks daily /  neosprin and dressing daily / per per still has drainage   Patellar bursitis of left knee    pre-patella septic bursitis w/ foreign body   Right thyroid nodule    x2 right side incidental finding on carotid duplex 03/ 2018-- thyroid ultrasound done , benign , follow-up in one year   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/29/20201   in situ-right upper arm-anterior   Squamous cell carcinoma of skin 11/04/2019   KA-right lower leg-anterior superior   Wears hearing aid in both ears    Past Surgical History:  Procedure Laterality Date   APPENDECTOMY  age 4   BREAST BIOPSY  1962   benign   CATARACT EXTRACTION W/PHACO  06/25/2011   Procedure: CATARACT EXTRACTION PHACO AND INTRAOCULAR LENS PLACEMENT (IOC);  Surgeon: Susa Simmonds, MD;  Location:  [facility];  Service: Ophthalmology;  Laterality: Right;  CDE: 8.90   CATARACT EXTRACTION W/PHACO Left 05/05/2012   Procedure: CATARACT EXTRACTION PHACO AND INTRAOCULAR LENS PLACEMENT (IOC);  Surgeon: Susa Simmonds, MD;  Location: [facility];  Service: Ophthalmology;  Laterality: Left;  CDE 12.78   COLONOSCOPY  last one 11-26-2012   CYST EXCISION Left 04/16/2022   Procedure: LEFT RING FINGER EXCISION MUCOID CYST AND DISTAL INTERPHALANGEAL JOINT DEBRIDEMENT;  Surgeon: Betha Loa, MD;  Location: [facility];  Service: Orthopedics;  Laterality: Left;   EXCISION MORTON'S NEUROMA Right 2003  approx.   INCISION AND DRAINAGE WOUND WITH FOREIGN BODY REMOVAL Left 08/30/2016   Procedure: LEFT KNEE INCISION AND DRAINAGE WOUND WITH FOREIGN BODY REMOVAL;  Surgeon: Eugenia Mcalpine, MD;  Location: [facility];  Service: Orthopedics;  Laterality: Left;   KNEE ARTHROPLASTY Right 03/14/2017   Procedure: RIGHT TOTAL KNEE ARTHROPLASTY WITH COMPUTER NAVIGATION AND REMOVAL OF HARDWARE;  Surgeon: Samson Frederic, MD;  Location: [facility];  Service: Orthopedics;  Laterality: Right;  Adductor Block   KNEE ARTHROSCOPY W/ ACL RECONSTRUCTION Right 1996   KNEE CARTILAGE SURGERY Right age 75   LUMBAR FUSION  08/28/2020  MOHS SURGERY  02/10/2013   left cheek -- [facility]   POSTERIOR REPAIR  05-21-2002   dr Emelda Fear   symptomatic recetocele   TONSILLECTOMY AND ADENOIDECTOMY  age 84   TRANSTHORACIC ECHOCARDIOGRAM  04-17-2016  dr Diona Browner   ef 60-65%/  trivial MR/ mild TR   VAGINAL HYSTERECTOMY  1979   w/  Bilateral Salpingoophorectomy   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 Everlene Other  REFERRING PROVIDER: Dr. Ramond Marrow  ONSET DATE: 06/04/22  REFERRING DIAG: Right partial RC Tear  THERAPY DIAG:  Acute pain of right shoulder  Other symptoms and signs involving the musculoskeletal system  Rationale for Evaluation and Treatment: Rehabilitation  SUBJECTIVE:   SUBJECTIVE STATEMENT: S: "I feel like my arm is getting better."  PERTINENT HISTORY: Pt is an 82 y/o female who had a fall when trying to get to the restroom overnight. Pt had an MRI which shows a partial RC tear.   PRECAUTIONS: None  WEIGHT BEARING RESTRICTIONS: No  PAIN:  Are you having pain? No  FALLS: Has patient fallen in last 6 months? Yes. Number of falls 2  PLOF: Independent  PATIENT GOALS: To have more strength and improved use of the right arm.   NEXT MD VISIT: In about a month-no appt made yet  OBJECTIVE:   HAND DOMINANCE: Left  ADLs: Overall ADLs: Pt has difficulty with overhead reaching and lifting. Pt reports some pain, more at night, tries not to roll over on the right side.   FUNCTIONAL OUTCOME MEASURES: FOTO: 59/100  UPPER EXTREMITY ROM:       Assessed in sitting, er/IR adducted  Active ROM Right eval  Shoulder flexion 135  Shoulder abduction 153  Shoulder internal rotation 90  Shoulder external rotation 90  (Blank rows = not tested)    UPPER EXTREMITY MMT:     Assessed in sitting, er/IR adducted  MMT Right eval  Shoulder flexion 5/5  Shoulder abduction 4-/5  Shoulder internal rotation 5/5  Shoulder external rotation 4+/5  (Blank rows = not tested)   OBSERVATIONS: min/mod fascial restrictions in right upper arm, anterior shoulder, and trapezius regions   TODAY'S TREATMENT:                                                                                                                              DATE:  07/27/22 -Myofascial release to right upper arm, anterior shoulder, trapezius regions to  decrease pain and fascial restrictions and improve joint ROM -P/ROM: supine-flexion, abduction, er/IR, horizontal abduction, 5 reps -A/ROM: seated, flexion, abduction, protraction, horizontal abduction, er/IR, x10 -Strengthening: standing,  2#-protraction, flexion, horizontal abduction, er/IR, abduction, 10 reps -Scapular Strengthening: green band, extension, retraction, protraction, rows, x15  07/18/22 -Myofascial release to right upper arm, anterior shoulder, trapezius regions to decrease pain and fascial restrictions and improve joint ROM -P/ROM: supine-flexion, abduction, er/IR, horizontal abduction, 5 reps -Strengthening: supine, 2#-protraction, flexion, horizontal abduction, er/IR, 1# abduction, 10 reps -Strengthening: standing, 1#-protraction, flexion, horizontal abduction, er/IR, abduction, 10 reps   PATIENT EDUCATION: Education details: Publishing rights manager Person educated: Patient Education method: Explanation, Demonstration, and Handouts Education comprehension: verbalized understanding and returned demonstration  HOME EXERCISE PROGRAM: 6/3: shoulder stretches, A/ROM 6/12: add 1 or 2# weight to A/ROM for strengthening 6/21: Scapular Strengthening  GOALS: Goals reviewed with patient? Yes   SHORT TERM GOALS: Target date: 08/07/22  Pt will be provided with and educated on HEP to improve mobility in RUE required for use during ADL completion.   Goal status: IN PROGRESS  2.  Pt will increase RUE A/ROM by 10 degrees flexion to improve ability to use RUE during dressing tasks with minimal compensatory techniques.   Goal status: IN PROGRESS  3.  Pt will increase RUE strength to 4+/5 throughout to improve ability to use RUE during strenuous tasks like kayaking and yardwork.  Goal status: IN PROGRESS  4.  Pt will decrease pain in RUE to 2/10 or less to improve ability to use RUE for housework and yardwork tasks.   Goal status: IN PROGRESS  Pt will decrease RUE fascial  restrictions to min amounts or less to improve mobility required for functional reaching tasks.   Goal status: IN PROGRESS    ASSESSMENT:  CLINICAL IMPRESSION: Pt's ROM and strength are continuing to improve well. She demonstrated good movement pattern throughout session. OT added scapular strengthening for continued strengthening and stabilizing. Verbal and visual cuing provided through for improved technique and positioning.   PERFORMANCE DEFICITS: in functional skills including in functional skills including ADLs, IADLs, coordination, tone, ROM, strength, pain, fascial restrictions, muscle spasms, and UE functional use   PLAN:  OT FREQUENCY: 1x/week  OT DURATION: 4 weeks  PLANNED INTERVENTIONS: self care/ADL training, therapeutic exercise, therapeutic activity, neuromuscular re-education, manual therapy, passive range of motion, splinting, electrical stimulation, ultrasound, moist heat, cryotherapy, patient/family education, and DME and/or AE instructions  CONSULTED AND AGREED WITH PLAN OF CARE: Patient  PLAN FOR NEXT SESSION: Follow up on HEP, continue with strengthening and activity tolerance, update HEP for scapular Cathren Harsh, OTR/L [phone] 07/27/2022, 9:58 PM

## 2022-07-27 NOTE — Patient Instructions (Signed)

## 2022-07-30 ENCOUNTER — Emergency Department (HOSPITAL_COMMUNITY)
Admission: EM | Admit: 2022-07-30 | Discharge: 2022-07-30 | Disposition: A | Discharge status: Home / Self Care | Payer: Medicare Other | Attending: Student | Admitting: Student

## 2022-07-30 ENCOUNTER — Other Ambulatory Visit: Payer: Self-pay

## 2022-07-30 ENCOUNTER — Encounter (HOSPITAL_COMMUNITY): Payer: Self-pay | Admitting: Emergency Medicine

## 2022-07-30 DIAGNOSIS — R404 Transient alteration of awareness: Secondary | ICD-10-CM | POA: Diagnosis not present

## 2022-07-30 DIAGNOSIS — R42 Dizziness and giddiness: Secondary | ICD-10-CM | POA: Diagnosis not present

## 2022-07-30 DIAGNOSIS — Z85828 Personal history of other malignant neoplasm of skin: Secondary | ICD-10-CM | POA: Diagnosis not present

## 2022-07-30 DIAGNOSIS — Z87891 Personal history of nicotine dependence: Secondary | ICD-10-CM | POA: Insufficient documentation

## 2022-07-30 DIAGNOSIS — I129 Hypertensive chronic kidney disease with stage 1 through stage 4 chronic kidney disease, or unspecified chronic kidney disease: Secondary | ICD-10-CM | POA: Insufficient documentation

## 2022-07-30 DIAGNOSIS — S0990XS Unspecified injury of head, sequela: Secondary | ICD-10-CM | POA: Insufficient documentation

## 2022-07-30 DIAGNOSIS — Z7982 Long term (current) use of aspirin: Secondary | ICD-10-CM | POA: Insufficient documentation

## 2022-07-30 DIAGNOSIS — N189 Chronic kidney disease, unspecified: Secondary | ICD-10-CM | POA: Insufficient documentation

## 2022-07-30 DIAGNOSIS — Z6821 Body mass index (BMI) 21.0-21.9, adult: Secondary | ICD-10-CM | POA: Diagnosis not present

## 2022-07-30 DIAGNOSIS — W19XXXS Unspecified fall, sequela: Secondary | ICD-10-CM | POA: Diagnosis not present

## 2022-07-30 DIAGNOSIS — Z79899 Other long term (current) drug therapy: Secondary | ICD-10-CM | POA: Diagnosis not present

## 2022-07-30 DIAGNOSIS — R410 Disorientation, unspecified: Secondary | ICD-10-CM | POA: Diagnosis not present

## 2022-07-30 LAB — COMPREHENSIVE METABOLIC PANEL
ALT: 24 U/L (ref 0–44)
AST: 23 U/L (ref 15–41)
Albumin: 4.9 g/dL (ref 3.5–5.0)
Alkaline Phosphatase: 58 U/L (ref 38–126)
Anion gap: 10 (ref 5–15)
BUN: 11 mg/dL (ref 8–23)
CO2: 26 mmol/L (ref 22–32)
Calcium: 9.6 mg/dL (ref 8.9–10.3)
Chloride: 98 mmol/L (ref 98–111)
Creatinine, Ser: 0.64 mg/dL (ref 0.44–1.00)
GFR, Estimated: >60 mL/min (ref >60)
Glucose, Bld: 99 mg/dL (ref 70–99)
Potassium: 3.8 mmol/L (ref 3.5–5.1)
Sodium: 134 mmol/L — ABNORMAL LOW (ref 135–145)
Total Bilirubin: 1.3 mg/dL — ABNORMAL HIGH (ref 0.3–1.2)
Total Protein: 7.3 g/dL (ref 6.5–8.1)

## 2022-07-30 LAB — CBC WITH DIFFERENTIAL/PLATELET
Abs Immature Granulocytes: 0.01 10*3/uL (ref 0.00–0.07)
Basophils Absolute: 0 10*3/uL (ref 0.0–0.1)
Basophils Relative: 1 %
Eosinophils Absolute: 0.1 10*3/uL (ref 0.0–0.5)
Eosinophils Relative: 1 %
HCT: 40.4 % (ref 36.0–46.0)
Hemoglobin: 13.8 g/dL (ref 12.0–15.0)
Immature Granulocytes: 0 %
Lymphocytes Relative: 24 %
Lymphs Abs: 1.4 10*3/uL (ref 0.7–4.0)
MCH: 33.3 pg (ref 26.0–34.0)
MCHC: 34.2 g/dL (ref 30.0–36.0)
MCV: 97.3 fL (ref 80.0–100.0)
Monocytes Absolute: 0.8 10*3/uL (ref 0.1–1.0)
Monocytes Relative: 13 %
Neutro Abs: 3.5 10*3/uL (ref 1.7–7.7)
Neutrophils Relative %: 61 %
Platelets: 204 10*3/uL (ref 150–400)
RBC: 4.15 MIL/uL (ref 3.87–5.11)
RDW: 12.1 % (ref 11.5–15.5)
WBC: 5.7 10*3/uL (ref 4.0–10.5)
nRBC: 0 % (ref 0.0–0.2)

## 2022-07-30 LAB — NA AND K (SODIUM & POTASSIUM), RAND UR
Potassium Urine: 32 mmol/L
Sodium, Ur: 35 mmol/L

## 2022-07-30 LAB — TSH: TSH: 1.945 u[IU]/mL (ref 0.350–4.500)

## 2022-07-30 NOTE — ED Notes (Signed)
Introduced self to pt Pt stated that she has been lacking energy Denies CP & SOB Denies ABD pain and fever. Pt stated she has been peeing and pooping normal. Denies cough or recent illness Feel 4/29, had concussion and has not been the same since

## 2022-07-30 NOTE — ED Provider Notes (Signed)
[place] EMERGENCY DEPARTMENT AT [facility] Provider Note  CSN: [id number] Arrival date & time: 07/30/22 1526  Chief Complaint(s) Abnormal Lab  HPI [name] is a 82 y.o. female with PMH HTN, multinodular goiter, HLD, osteopenia, GERD, whitecoat hypertension who presents emergency department for evaluation of concern for hyponatremia.  Patient states that she suffered a fall with head trauma on 06/28/2022 where she was found to be very hyponatremic and ultimately was discharged with outpatient follow-up after sodium improved.  She has since been seen in follow-up with last sodium 133 on 07/17/2022.  She states that she has a persistent "brain fog" and went to urgent care requesting sodium testing but for an unknown reason they became very concerned about the patient and demanded that she comes to the ER in an ambulance.  Patient is overall asymptomatic with no neurologic complaints and it is unclear as to why she was emergently transferred to the emergency department for routine blood work today.  She does not arrive with any documentation from urgent care.  Here in the emergency department, she denies any numbness, tingling, weakness or neurologic complaints.  Denies chest pain, shortness of breath, abdominal pain, nausea, vomiting or other systemic complaints.  She states that she took her blood pressure this morning and systolics were in the 140s and she has a known history of whitecoat hypertension.  She states that her blood pressures are always significantly elevated whenever seen in the healthcare setting and patient does arrive hypertensive here in the ER today.   Past Medical History Past Medical History:  Diagnosis Date   Arthritis    Basal cell carcinoma 04/29/2019   right inner cheek [facility])   Basal cell carcinoma 04/29/2019   left side nose (MOHs)   Chronic kidney disease    Diverticulosis of colon    Essential hypertension    History of basal cell carcinoma (BCC)  excision    02-10-2013  left cheek  s/p  moh's sx   History of palpitations    History of recurrent UTIs    History of syncope    2015-- felt to vasovagal response to pain (per cardiologist note)   IBS (irritable bowel syndrome)    Open wound of left knee    warm soaks daily /  neosprin and dressing daily / per per still has drainage   Patellar bursitis of left knee    pre-patella septic bursitis w/ foreign body   Right thyroid nodule    x2 right side incidental finding on carotid duplex 03/ 2018-- thyroid ultrasound done , benign , follow-up in one year   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/29/20201   in situ-right upper arm-anterior   Squamous cell carcinoma of skin 11/04/2019   KA-right lower leg-anterior superior   Wears hearing aid in both ears    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   Essential hypertension 03/25/2020   S/P knee surgery 08/30/2016   History of recurrent UTIs 09/19/2015   Osteopenia 05/21/2015   Home Medication(s) Prior to Admission medications   Medication Sig Start Date End Date Taking? Authorizing Provider  acetaminophen (TYLENOL) 500 MG tablet Take by mouth. 06/04/17   [provider]  aspirin EC 81 MG tablet Take 1 tablet (81 mg total) by mouth daily.  Swallow whole. 07/15/20   Jodelle Gross, NP  atorvastatin (LIPITOR) 40 MG tablet TAKE 1 TABLET ONCE DAILY. 06/26/22   Tommie Sams, DO  Calcium Carbonate-Vit D-Min (CALCIUM 1200 PO) Take 600 mg by mouth 2 (two) times daily.    [provider]  Flaxseed, Linseed, (FLAX PO) Take 1 Dose by mouth every morning.     [provider]  losartan (COZAAR) 100 MG tablet TAKE 1 TABLET BY MOUTH EVERY MORNING. Patient taking differently: Take 100 mg by  mouth daily. 06/26/22   Tommie Sams, DO  metoprolol tartrate (LOPRESSOR) 50 MG tablet TAKE (1) TABLET TWICE DAILY. Patient taking differently: Take 50 mg by mouth 2 (two) times daily. 06/28/22   Tommie Sams, DO  Multiple Vitamins-Minerals (CENTRUM SILVER) tablet Take 1 tablet by mouth daily.    [provider]  OMEGA 3 1000 MG CAPS Take 1,000 mg by mouth at bedtime.     [provider]  Probiotic Product (PROBIOTIC DAILY PO) Take 1 tablet by mouth daily.    [provider]  Psyllium (METAMUCIL PO) Take 1 Scoop by mouth daily.    [provider]                                                                                                                                    Past Surgical History Past Surgical History:  Procedure Laterality Date   APPENDECTOMY  age 76   BREAST BIOPSY  1962   benign   CATARACT EXTRACTION W/PHACO  06/25/2011   Procedure: CATARACT EXTRACTION PHACO AND INTRAOCULAR LENS PLACEMENT (IOC);  Surgeon: Susa Simmonds, MD;  Location: [facility];  Service: Ophthalmology;  Laterality: Right;  CDE: 8.90   CATARACT EXTRACTION W/PHACO Left 05/05/2012   Procedure: CATARACT EXTRACTION PHACO AND INTRAOCULAR LENS PLACEMENT (IOC);  Surgeon: Susa Simmonds, MD;  Location: [facility];  Service: Ophthalmology;  Laterality: Left;  CDE 12.78   COLONOSCOPY  last one 11-26-2012   CYST EXCISION Left 04/16/2022   Procedure: LEFT RING FINGER EXCISION MUCOID CYST AND DISTAL INTERPHALANGEAL JOINT DEBRIDEMENT;  Surgeon: Betha Loa, MD;  Location: [facility];  Service: Orthopedics;  Laterality: Left;   EXCISION MORTON'S NEUROMA Right 2003  approx.   INCISION AND DRAINAGE WOUND WITH FOREIGN BODY REMOVAL Left 08/30/2016   Procedure: LEFT KNEE INCISION AND DRAINAGE WOUND WITH FOREIGN BODY REMOVAL;  Surgeon: Eugenia Mcalpine, MD;  Location: [facility];  Service: Orthopedics;  Laterality: Left;   KNEE ARTHROPLASTY Right 03/14/2017    Procedure: RIGHT TOTAL KNEE ARTHROPLASTY WITH COMPUTER NAVIGATION AND REMOVAL OF HARDWARE;  Surgeon: Samson Frederic, MD;  Location: [facility];  Service: Orthopedics;  Laterality: Right;  Adductor Block   KNEE ARTHROSCOPY W/ ACL RECONSTRUCTION Right 1996   KNEE CARTILAGE SURGERY Right age 29   LUMBAR FUSION  08/28/2020   MOHS SURGERY  02/10/2013   left cheek -- BCC  POSTERIOR REPAIR  05-21-2002   dr Emelda Fear   symptomatic recetocele   TONSILLECTOMY AND ADENOIDECTOMY  age 48   TRANSTHORACIC ECHOCARDIOGRAM  04-17-2016  dr Diona Browner   ef 60-65%/  trivial MR/ mild TR   VAGINAL HYSTERECTOMY  1979   w/  Bilateral Salpingoophorectomy   Family History Family History  Problem Relation Age of Onset   Hypertension Mother    CVA Mother    Throat cancer Father    Pseudochol deficiency Neg Hx    Malignant hyperthermia Neg Hx    Hypotension Neg Hx    Anesthesia problems Neg Hx    Colon cancer Neg Hx    Esophageal cancer Neg Hx     Social History Social History   Tobacco Use   Smoking status: Former    Packs/day: 0.50    Years: 10.00    Additional pack years: 0.00    Total pack years: 5.00    Types: Cigarettes    Quit date: 04/30/1962    Years since quitting: 60.2   Smokeless tobacco: Never  Vaping Use   Vaping Use: Never used  Substance Use Topics   Alcohol use: Yes    Alcohol/week: 14.0 standard drinks of alcohol    Types: 14 Glasses of wine per week    Comment: 2 wine daily   Drug use: No   Allergies Codeine and Penicillins  Review of Systems Review of Systems  All other systems reviewed and are negative.   Physical Exam Vital Signs  I have reviewed the triage vital signs BP (!) 214/116 (BP Location: Left Arm)   Pulse 71   Temp 99 F (37.2 C) (Oral)   Resp 18   Ht 5' (1.524 m)   Wt 50.8 kg   LMP  (LMP Unknown)   SpO2 100%   BMI 21.87 kg/m   Physical Exam Vitals and nursing note reviewed.  Constitutional:      General: She is not in acute distress.     Appearance: She is well-developed.  HENT:     Head: Normocephalic and atraumatic.  Eyes:     Conjunctiva/sclera: Conjunctivae normal.  Cardiovascular:     Rate and Rhythm: Normal rate and regular rhythm.     Heart sounds: No murmur heard. Pulmonary:     Effort: Pulmonary effort is normal. No respiratory distress.     Breath sounds: Normal breath sounds.  Abdominal:     Palpations: Abdomen is soft.     Tenderness: There is no abdominal tenderness.  Musculoskeletal:        General: No swelling.     Cervical back: Neck supple.  Skin:    General: Skin is warm and dry.     Capillary Refill: Capillary refill takes less than 2 seconds.  Neurological:     Mental Status: She is alert.  Psychiatric:        Mood and Affect: Mood normal.     ED Results and Treatments Labs (all labs ordered are listed, but only abnormal results are displayed) Labs Reviewed  CBC WITH DIFFERENTIAL/PLATELET  COMPREHENSIVE METABOLIC PANEL  NA AND K (SODIUM & POTASSIUM), RAND UR  Radiology No results found.  Pertinent labs & imaging results that were available during my care of the patient were reviewed by me and considered in my medical decision making (see MDM for details).  Medications Ordered in ED Medications - No data to display                                                                                                                                   Procedures Procedures  (including critical care time)  Medical Decision Making / ED Course   This patient presents to the ED for concern of ***, this involves an extensive number of treatment options, and is a complaint that carries with it a high risk of complications and morbidity.  The differential diagnosis includes ***  MDM: ***   Additional history obtained: -Additional history obtained from *** -External  records from outside source obtained and reviewed including: Chart review including previous notes, labs, imaging, consultation notes   Lab Tests: -I ordered, reviewed, and interpreted labs.   The pertinent results include:   Labs Reviewed  CBC WITH DIFFERENTIAL/PLATELET  COMPREHENSIVE METABOLIC PANEL  NA AND K (SODIUM & POTASSIUM), RAND UR      EKG ***  EKG Interpretation  Date/Time:    Ventricular Rate:    PR Interval:    QRS Duration:   QT Interval:    QTC Calculation:   R Axis:     Text Interpretation:           Imaging Studies ordered: I ordered imaging studies including *** I independently visualized and interpreted imaging. I agree with the radiologist interpretation   Medicines ordered and prescription drug management: No orders of the defined types were placed in this encounter.   -I have reviewed the patients home medicines and have made adjustments as needed  Critical interventions ***  Consultations Obtained: I requested consultation with the ***,  and discussed lab and imaging findings as well as pertinent plan - they recommend: ***   Cardiac Monitoring: The patient was maintained on a cardiac monitor.  I personally viewed and interpreted the cardiac monitored which showed an underlying rhythm of: ***  Social Determinants of Health:  Factors impacting patients care include: ***   Reevaluation: After the interventions noted above, I reevaluated the patient and found that they have :{resolved/improved/worsened:23923::"improved"}  Co morbidities that complicate the patient evaluation  Past Medical History:  Diagnosis Date   Arthritis    Basal cell carcinoma 04/29/2019   right inner cheek [facility])   Basal cell carcinoma 04/29/2019   left side nose (MOHs)   Chronic kidney disease    Diverticulosis of colon    Essential hypertension    History of basal cell carcinoma (BCC) excision    02-10-2013  left cheek  s/p  moh's sx   History of  palpitations    History of recurrent UTIs    History of syncope  2015-- felt to vasovagal response to pain (per cardiologist note)   IBS (irritable bowel syndrome)    Open wound of left knee    warm soaks daily /  neosprin and dressing daily / per per still has drainage   Patellar bursitis of left knee    pre-patella septic bursitis w/ foreign body   Right thyroid nodule    x2 right side incidental finding on carotid duplex 03/ 2018-- thyroid ultrasound done , benign , follow-up in one year   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/29/20201   in situ-right upper arm-anterior   Squamous cell carcinoma of skin 11/04/2019   KA-right lower leg-anterior superior   Wears hearing aid in both ears       Dispostion: I considered admission for this patient, ***     Final Clinical Impression(s) / ED Diagnoses Final diagnoses:  None     @PCDICTATION @

## 2022-07-30 NOTE — ED Triage Notes (Signed)
Pt via POV c/o profound fatigue ongoing with occasional periods of "brain fog" x 1 month. Pt was recently treated for hyponatremia and says current symptoms are the same. She has been drinking gatorade and similar electrolyte replacement drinks but symptoms have not improved. Pt denies pain today.

## 2022-07-30 NOTE — ED Notes (Signed)
Pt able to walk to and from restroom without difficulty or assistance UA provided IV access established 12 lead sent

## 2022-08-01 ENCOUNTER — Encounter (HOSPITAL_COMMUNITY): Payer: Self-pay | Admitting: Occupational Therapy

## 2022-08-01 ENCOUNTER — Ambulatory Visit (HOSPITAL_COMMUNITY): Payer: Medicare Other | Admitting: Occupational Therapy

## 2022-08-01 DIAGNOSIS — M25511 Pain in right shoulder: Secondary | ICD-10-CM

## 2022-08-01 DIAGNOSIS — R29898 Other symptoms and signs involving the musculoskeletal system: Secondary | ICD-10-CM

## 2022-08-01 NOTE — Therapy (Signed)
OUTPATIENT OCCUPATIONAL THERAPY ORTHO TREATMENT  Patient Name: [name] MRN: [id number] DOB:August 13, 1940, 82 y.o., female Today's Date: 08/01/2022   END OF SESSION:  OT End of Session - 08/01/22 1335     Visit Number 4    Number of Visits 5    Date for OT Re-Evaluation 08/08/22    Authorization Type [employer] Medicare, $20 copay    Progress Note Due on Visit 10    OT Start Time 1304    OT Stop Time 1344    OT Time Calculation (min) 40 min    Activity Tolerance Patient tolerated treatment well    Behavior During Therapy [facility] for tasks assessed/performed               Past Medical History:  Diagnosis Date   Arthritis    Basal cell carcinoma 04/29/2019   right inner cheek [facility])   Basal cell carcinoma 04/29/2019   left side nose (MOHs)   Chronic kidney disease    Diverticulosis of colon    Essential hypertension    History of basal cell carcinoma (BCC) excision    02-10-2013  left cheek  s/p  moh's sx   History of palpitations    History of recurrent UTIs    History of syncope    2015-- felt to vasovagal response to pain (per cardiologist note)   IBS (irritable bowel syndrome)    Open wound of left knee    warm soaks daily /  neosprin and dressing daily / per per still has drainage   Patellar bursitis of left knee    pre-patella septic bursitis w/ foreign body   Right thyroid nodule    x2 right side incidental finding on carotid duplex 03/ 2018-- thyroid ultrasound done , benign , follow-up in one year   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/29/20201   in situ-right upper arm-anterior   Squamous cell carcinoma of skin 11/04/2019   KA-right lower leg-anterior superior   Wears hearing aid in both ears    Past Surgical History:  Procedure Laterality Date   APPENDECTOMY  age 31   BREAST BIOPSY  1962   benign   CATARACT EXTRACTION W/PHACO  06/25/2011   Procedure: CATARACT EXTRACTION PHACO AND INTRAOCULAR LENS PLACEMENT (IOC);  Surgeon: Susa Simmonds, MD;   Location: [facility];  Service: Ophthalmology;  Laterality: Right;  CDE: 8.90   CATARACT EXTRACTION W/PHACO Left 05/05/2012   Procedure: CATARACT EXTRACTION PHACO AND INTRAOCULAR LENS PLACEMENT (IOC);  Surgeon: Susa Simmonds, MD;  Location: [facility];  Service: Ophthalmology;  Laterality: Left;  CDE 12.78   COLONOSCOPY  last one 11-26-2012   CYST EXCISION Left 04/16/2022   Procedure: LEFT RING FINGER EXCISION MUCOID CYST AND DISTAL INTERPHALANGEAL JOINT DEBRIDEMENT;  Surgeon: Betha Loa, MD;  Location: [facility];  Service: Orthopedics;  Laterality: Left;   EXCISION MORTON'S NEUROMA Right 2003  approx.   INCISION AND DRAINAGE WOUND WITH FOREIGN BODY REMOVAL Left 08/30/2016   Procedure: LEFT KNEE INCISION AND DRAINAGE WOUND WITH FOREIGN BODY REMOVAL;  Surgeon: Eugenia Mcalpine, MD;  Location: [facility];  Service: Orthopedics;  Laterality: Left;   KNEE ARTHROPLASTY Right 03/14/2017   Procedure: RIGHT TOTAL KNEE ARTHROPLASTY WITH COMPUTER NAVIGATION AND REMOVAL OF HARDWARE;  Surgeon: Samson Frederic, MD;  Location: [facility];  Service: Orthopedics;  Laterality: Right;  Adductor Block   KNEE ARTHROSCOPY W/ ACL RECONSTRUCTION Right 1996   KNEE CARTILAGE SURGERY Right age 53   LUMBAR FUSION  08/28/2020  MOHS SURGERY  02/10/2013   left cheek -- [facility]   POSTERIOR REPAIR  05-21-2002   dr Emelda Fear   symptomatic recetocele   TONSILLECTOMY AND ADENOIDECTOMY  age 56   TRANSTHORACIC ECHOCARDIOGRAM  04-17-2016  dr Diona Browner   ef 60-65%/  trivial MR/ mild TR   VAGINAL HYSTERECTOMY  1979   w/  Bilateral Salpingoophorectomy   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   Essential hypertension 03/25/2020   S/P knee surgery 08/30/2016   History of  recurrent UTIs 09/19/2015   Osteopenia 05/21/2015    PCP: Dr. Everlene Other  REFERRING PROVIDER: Dr. Ramond Marrow  ONSET DATE: 06/04/22  REFERRING DIAG: Right partial RC Tear  THERAPY DIAG:  Acute pain of right shoulder  Other symptoms and signs involving the musculoskeletal system  Rationale for Evaluation and Treatment: Rehabilitation  SUBJECTIVE:   SUBJECTIVE STATEMENT: S: "I had to go back to the hospital"  PERTINENT HISTORY: Pt is an 82 y/o female who had a fall when trying to get to the restroom overnight. Pt had an MRI which shows a partial RC tear.   PRECAUTIONS: None  WEIGHT BEARING RESTRICTIONS: No  PAIN:  Are you having pain? No  FALLS: Has patient fallen in last 6 months? Yes. Number of falls 2  PLOF: Independent  PATIENT GOALS: To have more strength and improved use of the right arm.   NEXT MD VISIT: In about a month-no appt made yet  OBJECTIVE:   HAND DOMINANCE: Left  ADLs: Overall ADLs: Pt has difficulty with overhead reaching and lifting. Pt reports some pain, more at night, tries not to roll over on the right side.   FUNCTIONAL OUTCOME MEASURES: FOTO: 59/100  UPPER EXTREMITY ROM:       Assessed in sitting, er/IR adducted  Active ROM Right eval  Shoulder flexion 135  Shoulder abduction 153  Shoulder internal rotation 90  Shoulder external rotation 90  (Blank rows = not tested)    UPPER EXTREMITY MMT:     Assessed in sitting, er/IR adducted  MMT Right eval  Shoulder flexion 5/5  Shoulder abduction 4-/5  Shoulder internal rotation 5/5  Shoulder external rotation 4+/5  (Blank rows = not tested)   OBSERVATIONS: min/mod fascial restrictions in right upper arm, anterior shoulder, and trapezius regions   TODAY'S TREATMENT:                                                                                                                              DATE:  08/01/22 -Myofascial release to right upper arm, anterior shoulder, trapezius  regions to decrease pain and fascial restrictions and improve joint ROM -P/ROM: supine-flexion, abduction, er/IR, horizontal abduction, 5 reps -Strengthening: supine, 2#-protraction, flexion, horizontal abduction, er/IR, abduction, 10 reps -Proximal  shoulder strengthening: supine-2#, paddles, criss cross, circles each direction, 10 reps -Strengthening: green theraband-protraction, flexion, horizontal abduction, er/IR, abduction, 10 reps -Ball on wall: 1' flexion, 1' abduction -Overhead lacing: 2# wrist weight-seated, lacing from top down then reversing -UBE: level 2, 3' forward 3' reverse, pace: 8.0  07/27/22 -Myofascial release to right upper arm, anterior shoulder, trapezius regions to decrease pain and fascial restrictions and improve joint ROM -P/ROM: supine-flexion, abduction, er/IR, horizontal abduction, 5 reps -A/ROM: seated, flexion, abduction, protraction, horizontal abduction, er/IR, x10 -Strengthening: standing, 2#-protraction, flexion, horizontal abduction, er/IR, abduction, 10 reps -Scapular Strengthening: green band, extension, retraction, protraction, rows, x15  07/18/22 -Myofascial release to right upper arm, anterior shoulder, trapezius regions to decrease pain and fascial restrictions and improve joint ROM -P/ROM: supine-flexion, abduction, er/IR, horizontal abduction, 5 reps -Strengthening: supine, 2#-protraction, flexion, horizontal abduction, er/IR, 1# abduction, 10 reps -Strengthening: standing, 1#-protraction, flexion, horizontal abduction, er/IR, abduction, 10 reps   PATIENT EDUCATION: Education details: theraband strengthening Person educated: Patient Education method: Explanation, Demonstration, and Handouts Education comprehension: verbalized understanding and returned demonstration  HOME EXERCISE PROGRAM: 6/3: shoulder stretches, A/ROM 6/12: add 1 or 2# weight to A/ROM for strengthening 6/21: Scapular Strengthening 6/26: theraband  strengthening-green  GOALS: Goals reviewed with patient? Yes   SHORT TERM GOALS: Target date: 08/07/22  Pt will be provided with and educated on HEP to improve mobility in RUE required for use during ADL completion.   Goal status: IN PROGRESS  2.  Pt will increase RUE A/ROM by 10 degrees flexion to improve ability to use RUE during dressing tasks with minimal compensatory techniques.   Goal status: IN PROGRESS  3.  Pt will increase RUE strength to 4+/5 throughout to improve ability to use RUE during strenuous tasks like kayaking and yardwork.  Goal status: IN PROGRESS  4.  Pt will decrease pain in RUE to 2/10 or less to improve ability to use RUE for housework and yardwork tasks.   Goal status: IN PROGRESS  Pt will decrease RUE fascial restrictions to min amounts or less to improve mobility required for functional reaching tasks.   Goal status: IN PROGRESS    ASSESSMENT:  CLINICAL IMPRESSION: Pt reports she is using 2# weights for her HEP. Continued with manual techniques, strengthening and stability work. Added green theraband strengthening, ball on wall, overhead lacing, and UBE today. Pt reports occasional pulling in bicep with flexion strengthening. Reports fatigue in the forearm versus the shoulder at end of session. Verbal cuing for form and technique throughout session.   PERFORMANCE DEFICITS: in functional skills including in functional skills including ADLs, IADLs, coordination, tone, ROM, strength, pain, fascial restrictions, muscle spasms, and UE functional use   PLAN:  OT FREQUENCY: 1x/week  OT DURATION: 4 weeks  PLANNED INTERVENTIONS: self care/ADL training, therapeutic exercise, therapeutic activity, neuromuscular re-education, manual therapy, passive range of motion, splinting, electrical stimulation, ultrasound, moist heat, cryotherapy, patient/family education, and DME and/or AE instructions  CONSULTED AND AGREED WITH PLAN OF CARE: Patient  PLAN FOR NEXT  SESSION: Reassess & discharge with HEP   Ezra Sites, OTR/L  [phone] 08/01/2022, 1:44 PM

## 2022-08-01 NOTE — Patient Instructions (Signed)

## 2022-08-03 ENCOUNTER — Encounter: Payer: Self-pay | Admitting: Physician Assistant

## 2022-08-06 ENCOUNTER — Telehealth: Payer: Self-pay

## 2022-08-06 NOTE — Telephone Encounter (Signed)
Transition Care Management Follow-up Telephone Call Date of discharge and from where: 07/30/2022 [facility] How have you been since you were released from the hospital? Patient is feeling better Any questions or concerns? No  Items Reviewed: Did the pt receive and understand the discharge instructions provided? Yes  Medications obtained and verified? Yes  Other? No  Any new allergies since your discharge? No  Dietary orders reviewed? Yes Do you have support at home? Yes   Follow up appointments reviewed:  PCP Hospital f/u appt confirmed? No  Scheduled to see  on  @ . Specialist Hospital f/u appt confirmed? Yes  Scheduled to see Marlowe Kays, PA-C on 08/08/2022 @ [facility] Neurology. Are transportation arrangements needed? No  If their condition worsens, is the pt aware to call PCP or go to the Emergency Dept.? Yes Was the patient provided with contact information for the PCP's office or ED? Yes Was to pt encouraged to call back with questions or concerns? Yes  Pius Byrom [facility]  [facility] Population Health Community Resource Care Guide   [email or web address]  ?? [phone]   Website: [email or web address]  [email or web address]

## 2022-08-08 ENCOUNTER — Encounter (HOSPITAL_COMMUNITY): Payer: Self-pay | Admitting: Occupational Therapy

## 2022-08-08 ENCOUNTER — Encounter: Payer: Self-pay | Admitting: Physician Assistant

## 2022-08-08 ENCOUNTER — Other Ambulatory Visit (INDEPENDENT_AMBULATORY_CARE_PROVIDER_SITE_OTHER): Payer: Medicare Other

## 2022-08-08 ENCOUNTER — Ambulatory Visit (HOSPITAL_COMMUNITY): Payer: Medicare Other | Attending: Internal Medicine | Admitting: Occupational Therapy

## 2022-08-08 ENCOUNTER — Ambulatory Visit: Payer: Medicare Other | Admitting: Physician Assistant

## 2022-08-08 VITALS — BP 124/84 | HR 78 | Resp 18 | Ht 61.0 in | Wt 111.0 lb

## 2022-08-08 DIAGNOSIS — R29898 Other symptoms and signs involving the musculoskeletal system: Secondary | ICD-10-CM | POA: Diagnosis present

## 2022-08-08 DIAGNOSIS — E871 Hypo-osmolality and hyponatremia: Secondary | ICD-10-CM

## 2022-08-08 DIAGNOSIS — R404 Transient alteration of awareness: Secondary | ICD-10-CM | POA: Diagnosis not present

## 2022-08-08 DIAGNOSIS — E876 Hypokalemia: Secondary | ICD-10-CM | POA: Diagnosis not present

## 2022-08-08 DIAGNOSIS — M25511 Pain in right shoulder: Secondary | ICD-10-CM | POA: Diagnosis present

## 2022-08-08 LAB — COMPREHENSIVE METABOLIC PANEL
ALT: 20 U/L (ref 0–35)
AST: 18 U/L (ref 0–37)
Albumin: 5.1 g/dL (ref 3.5–5.2)
Alkaline Phosphatase: 59 U/L (ref 39–117)
BUN: 10 mg/dL (ref 6–23)
CO2: 31 mEq/L (ref 19–32)
Calcium: 10.5 mg/dL (ref 8.4–10.5)
Chloride: 97 mEq/L (ref 96–112)
Creatinine, Ser: 0.7 mg/dL (ref 0.40–1.20)
GFR: 80.16 mL/min (ref >60.00)
Glucose, Bld: 96 mg/dL (ref 70–99)
Potassium: 4.2 mEq/L (ref 3.5–5.1)
Sodium: 135 mEq/L (ref 135–145)
Total Bilirubin: 1.1 mg/dL (ref 0.2–1.2)
Total Protein: 7.7 g/dL (ref 6.0–8.3)

## 2022-08-08 LAB — HEMOGLOBIN A1C: Hgb A1c MFr Bld: 5.5 % (ref 4.6–6.5)

## 2022-08-08 NOTE — Patient Instructions (Addendum)
It was a pleasure to see you today at our office.   Recommendations:    MRI of the brain, the radiology office will call you to arrange you appointment   EEG Hemoglobin A1C, CMP  Follow up in 2 months No driving for now till the results from EEG    For psychiatric meds, mood meds: Please have your primary care physician manage these medications.  If you have any severe symptoms of a stroke, or other severe issues such as confusion,severe chills or fever, etc call 911 or go to the ER as you may need to be evaluated further    For assessment of decision of mental capacity and competency:  Call Dr. Erick Blinks, geriatric psychiatrist at [phone]  Counseling regarding caregiver distress, including caregiver depression, anxiety and issues regarding community resources, adult day care programs, adult living facilities, or memory care questions:  please contact your  Primary Doctor's Social Worker   Whom to call: Memory  decline, memory medications: Call our office [phone]    [email or web address]   RECOMMENDATIONS FOR ALL PATIENTS WITH MEMORY PROBLEMS: 1. Continue to exercise (Recommend 30 minutes of walking everyday, or 3 hours every week) 2. Increase social interactions - continue going to [place] and enjoy social gatherings with friends and family 3. Eat healthy, avoid fried foods and eat more fruits and vegetables 4. Maintain adequate blood pressure, blood sugar, and blood cholesterol level. Reducing the risk of stroke and cardiovascular disease also helps promoting better memory. 5. Avoid stressful situations. Live a simple life and avoid aggravations. Organize your time and prepare for the next day in anticipation. 6. Sleep well, avoid any interruptions of sleep and avoid any distractions in the bedroom that may interfere with adequate sleep quality 7. Avoid sugar, avoid sweets as there is a strong link between  excessive sugar intake, diabetes, and cognitive impairment We discussed the Mediterranean diet, which has been shown to help patients reduce the risk of progressive memory disorders and reduces cardiovascular risk. This includes eating fish, eat fruits and green leafy vegetables, nuts like almonds and hazelnuts, walnuts, and also use olive oil. Avoid fast foods and fried foods as much as possible. Avoid sweets and sugar as sugar use has been linked to worsening of memory function.  There is always a concern of gradual progression of memory problems. If this is the case, then we may need to adjust level of care according to patient needs. Support, both to the patient and caregiver, should then be put into place.         DRIVING: Regarding driving, in patients with progressive memory problems, driving will be impaired. We advise to have someone else do the driving if trouble finding directions or if minor accidents are reported. Independent driving assessment is available to determine safety of driving.   If you are interested in the driving assessment, you can contact the following:  [employer] in [place] [phone]  [facility] [phone]  [facility] [phone]  [facility] [phone] or [phone]   FALL PRECAUTIONS: Be cautious when walking. Scan the area for obstacles that may increase the risk of trips and falls. When getting up in the mornings, sit up at the edge of the bed for a few minutes before getting out of bed. Consider elevating the bed at the head end to avoid drop of blood pressure when getting up. Walk always in a well-lit room (use night lights in the walls). Avoid area rugs or power  cords from appliances in the middle of the walkways. Use a walker or a cane if necessary and consider physical therapy for balance exercise. Get your eyesight checked regularly.  FINANCIAL OVERSIGHT: Supervision, especially oversight  when making financial decisions or transactions is also recommended.  HOME SAFETY: Consider the safety of the kitchen when operating appliances like stoves, microwave oven, and blender. Consider having supervision and share cooking responsibilities until no longer able to participate in those. Accidents with firearms and other hazards in the house should be identified and addressed as well.   ABILITY TO BE LEFT ALONE: If patient is unable to contact 911 operator, consider using LifeLine, or when the need is there, arrange for someone to stay with patients. Smoking is a fire hazard, consider supervision or cessation. Risk of wandering should be assessed by caregiver and if detected at any point, supervision and safe proof recommendations should be instituted.  MEDICATION SUPERVISION: Inability to self-administer medication needs to be constantly addressed. Implement a mechanism to ensure safe administration of the medications.      Mediterranean Diet A Mediterranean diet refers to food and lifestyle choices that are based on the traditions of countries located on the [employer]. This way of eating has been shown to help prevent certain conditions and improve outcomes for people who have chronic diseases, like kidney disease and heart disease. What are tips for following this plan? Lifestyle  Cook and eat meals together with your family, when possible. Drink enough fluid to keep your urine clear or pale yellow. Be physically active every day. This includes: Aerobic exercise like running or swimming. Leisure activities like gardening, walking, or housework. Get 7-8 hours of sleep each night. If recommended by your health care provider, drink red wine in moderation. This means 1 glass a day for nonpregnant women and 2 glasses a day for men. A glass of wine equals 5 oz (150 mL). Reading food labels  Check the serving size of packaged foods. For foods such as rice and pasta, the serving  size refers to the amount of cooked product, not dry. Check the total fat in packaged foods. Avoid foods that have saturated fat or trans fats. Check the ingredients list for added sugars, such as corn syrup. Shopping  At the grocery store, buy most of your food from the areas near the walls of the store. This includes: Fresh fruits and vegetables (produce). Grains, beans, nuts, and seeds. Some of these may be available in unpackaged forms or large amounts (in bulk). Fresh seafood. Poultry and eggs. Low-fat dairy products. Buy whole ingredients instead of prepackaged foods. Buy fresh fruits and vegetables in-season from local farmers markets. Buy frozen fruits and vegetables in resealable bags. If you do not have access to quality fresh seafood, buy precooked frozen shrimp or canned fish, such as tuna, salmon, or sardines. Buy small amounts of raw or cooked vegetables, salads, or olives from the deli or salad bar at your store. Stock your pantry so you always have certain foods on hand, such as olive oil, canned tuna, canned tomatoes, rice, pasta, and beans. Cooking  Cook foods with extra-virgin olive oil instead of using butter or other vegetable oils. Have meat as a side dish, and have vegetables or grains as your main dish. This means having meat in small portions or adding small amounts of meat to foods like pasta or stew. Use beans or vegetables instead of meat in common dishes like chili or lasagna. Experiment with different cooking methods.  Try roasting or broiling vegetables instead of steaming or sauteing them. Add frozen vegetables to soups, stews, pasta, or rice. Add nuts or seeds for added healthy fat at each meal. You can add these to yogurt, salads, or vegetable dishes. Marinate fish or vegetables using olive oil, lemon juice, garlic, and fresh herbs. Meal planning  Plan to eat 1 vegetarian meal one day each week. Try to work up to 2 vegetarian meals, if possible. Eat seafood  2 or more times a week. Have healthy snacks readily available, such as: Vegetable sticks with hummus. Greek yogurt. Fruit and nut trail mix. Eat balanced meals throughout the week. This includes: Fruit: 2-3 servings a day Vegetables: 4-5 servings a day Low-fat dairy: 2 servings a day Fish, poultry, or lean meat: 1 serving a day Beans and legumes: 2 or more servings a week Nuts and seeds: 1-2 servings a day Whole grains: 6-8 servings a day Extra-virgin olive oil: 3-4 servings a day Limit red meat and sweets to only a few servings a month What are my food choices? Mediterranean diet Recommended Grains: Whole-grain pasta. Brown rice. Bulgar wheat. Polenta. Couscous. Whole-wheat bread. Orpah Cobb. Vegetables: Artichokes. Beets. Broccoli. Cabbage. Carrots. Eggplant. Green beans. Chard. Kale. Spinach. Onions. Leeks. Peas. Squash. Tomatoes. Peppers. Radishes. Fruits: Apples. Apricots. Avocado. Berries. Bananas. Cherries. Dates. Figs. Grapes. Lemons. Melon. Oranges. Peaches. Plums. Pomegranate. Meats and other protein foods: Beans. Almonds. Sunflower seeds. Pine nuts. Peanuts. Cod. Salmon. Scallops. Shrimp. Tuna. Tilapia. Clams. Oysters. Eggs. Dairy: Low-fat milk. Cheese. Greek yogurt. Beverages: Water. Red wine. Herbal tea. Fats and oils: Extra virgin olive oil. Avocado oil. Grape seed oil. Sweets and desserts: [place] yogurt with honey. Baked apples. Poached pears. Trail mix. Seasoning and other foods: Basil. Cilantro. Coriander. Cumin. Mint. Parsley. Sage. Rosemary. Tarragon. Garlic. Oregano. Thyme. Pepper. Balsalmic vinegar. Tahini. Hummus. Tomato sauce. Olives. Mushrooms. Limit these Grains: Prepackaged pasta or rice dishes. Prepackaged cereal with added sugar. Vegetables: Deep fried potatoes (french fries). Fruits: Fruit canned in syrup. Meats and other protein foods: Beef. Pork. Lamb. Poultry with skin. Hot dogs. Tomasa Blase. Dairy: Ice cream. Sour cream. Whole milk. Beverages: Juice.  Sugar-sweetened soft drinks. Beer. Liquor and spirits. Fats and oils: Butter. Canola oil. Vegetable oil. Beef fat (tallow). Lard. Sweets and desserts: Cookies. Cakes. Pies. Candy. Seasoning and other foods: Mayonnaise. Premade sauces and marinades. The items listed may not be a complete list. Talk with your dietitian about what dietary choices are right for you. Summary The Mediterranean diet includes both food and lifestyle choices. Eat a variety of fresh fruits and vegetables, beans, nuts, seeds, and whole grains. Limit the amount of red meat and sweets that you eat. Talk with your health care provider about whether it is safe for you to drink red wine in moderation. This means 1 glass a day for nonpregnant women and 2 glasses a day for men. A glass of wine equals 5 oz (150 mL). This information is not intended to replace advice given to you by your health care provider. Make sure you discuss any questions you have with your health care provider. Document Released: 09/15/2015 Document Revised: 10/18/2015 Document Reviewed: 09/15/2015 Elsevier Interactive Patient Education  2017 [employer].       Labs today [address] [facility] [phone]

## 2022-08-08 NOTE — Progress Notes (Signed)
[name] Clinic Note - Initial Visit   Date: 08/08/22  [name] MRN: [id number] DOB: 15-Mar-1940      IMPRESSION/PLAN:  Transient Alteration of Awareness, concern for Post Concussion Syndrome Recent Hyponatremia   [name] is a 82 y.o. L-handed female with a history of thyroid nodules, hyperlipidemia, hard of hearing with hearing aids, white coat hypertension, recurrent UTIs, IBS, and recent presentation to the ED on 06/29/2022 with AMS, 3 weeks after a concussion after mechanical fall with a whiplash and imbalance, nausea and vomiting.  At the time, she had been seen at the ED, with workup remarkable for hyponatremia (126), and a negative CT scan of the head.  The rest of the workup was negative. She was seen again on 07/30/2022  due to "persistent brain fog ".  At the ED, her workup was again negative, without neurological findings.  Sodium at the time was unremarkable at 134, normal TSH, CBC and other electrolytes.  She was referred here to further evaluate transient alteration of awareness, versus postconcussion syndrome. MoCA today is 22/30. She reports that her confusional state has improved since last ED discharge. Patient is able to participate on his IADLs Other workup is in progress.    Recommendations    MRI brain to rule out structural abnormalities and to evaluate vascular load. EEG for Transient alteration of awareness. No driving till results of EEG. No history of seizures A1C, B1, CMP Recommend good control of cardiovascular risk factors. BP very elevated this visit Return to clinic in 2 months.   Total time spent: 53 min    History of Present Illness:  R or L handed? L handed  Initial symptoms  "I had to urinate at 5 am and was trying to run to the bathroom and hit the dresser", hitting the frontoparietal area and felt a pop in the neck, "like whip splash". She also had a partial tear of the R rotator cuff   Witnessed?  No Tremor:  No  Voice: After the fall she feels she had hoarseness Hallucinations: No   visual distortions No             Auditory hallucinations? No   Taste of blood or metallic taste? No Nausea and Vomiting? After the fall , only one time Lightheadedness/ Dizziness? "Not really". "Just felt not right"  Syncope? No   Diplopia or other visual changes? No  History of encephalitis or meningitis? No  History of Headaches? "Only when I was menstruating" Loss of smell:  No Loss of taste :  No  Urinary or Bowel Incontinence :Denies Difficulty Swallowing ?No  Trouble with ADL's?"I have arthritis otherwise, no"  Trouble buttoning clothing? No   Mood Changes? Depression, more situational  Memory changes: "during that time big time"-husband says . "She was foggy brained during that time, she was more forgetful but now is way improved". During the time following the episode she retained her personal identity Sleep:  Sleeps well. How many hours? 8               Rested upon waking up? Yes, lately. ETOH? 2 glasses at night  Tobacco? No  Recreational Drugs? No   Caffeine? No  Active: Does yoga, gardening, etc, is very active            Out-side paper records, electronic medical record, and images have been reviewed where available and summarized as:   No results found for: "HGBA1C" No results found for: "VITAMINB12"  Lab Results  Component Value Date   TSH 1.945 07/30/2022   No results found for: "ESRSEDRATE", "POCTSEDRATE"  CT of the head and CT of the neck 06/04/2022, personally reviewed taken after head trauma, without evidence of acute intracranial or cervical spine injury.  CT of the head showed mild chronic small vessel ischemia in the cerebral white matter, no acute infarction, hemorrhage, hydrocephalus or any fluid collection or mass.  Past Medical History:  Diagnosis Date   Arthritis    Basal cell carcinoma 04/29/2019   right inner cheek [facility])   Basal cell carcinoma  04/29/2019   left side nose [facility])   Chronic kidney disease    Diverticulosis of colon    Essential hypertension    History of basal cell carcinoma (BCC) excision    02-10-2013  left cheek  s/p  moh's sx   History of palpitations    History of recurrent UTIs    History of syncope    2015-- felt to vasovagal response to pain (per cardiologist note)   IBS (irritable bowel syndrome)    Open wound of left knee    warm soaks daily /  neosprin and dressing daily / per per still has drainage   Patellar bursitis of left knee    pre-patella septic bursitis w/ foreign body   Right thyroid nodule    x2 right side incidental finding on carotid duplex 03/ 2018-- thyroid ultrasound done , benign , follow-up in one year   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/29/20201   in situ-right upper arm-anterior   Squamous cell carcinoma of skin 11/04/2019   KA-right lower leg-anterior superior   Wears hearing aid in both ears     Past Surgical History:  Procedure Laterality Date   APPENDECTOMY  age 67   BREAST BIOPSY  1962   benign   CATARACT EXTRACTION W/PHACO  06/25/2011   Procedure: CATARACT EXTRACTION PHACO AND INTRAOCULAR LENS PLACEMENT (IOC);  Surgeon: Susa Simmonds, MD;  Location: [facility];  Service: Ophthalmology;  Laterality: Right;  CDE: 8.90   CATARACT EXTRACTION W/PHACO Left 05/05/2012   Procedure: CATARACT EXTRACTION PHACO AND INTRAOCULAR LENS PLACEMENT (IOC);  Surgeon: Susa Simmonds, MD;  Location: [facility];  Service: Ophthalmology;  Laterality: Left;  CDE 12.78   COLONOSCOPY  last one 11-26-2012   CYST EXCISION Left 04/16/2022   Procedure: LEFT RING FINGER EXCISION MUCOID CYST AND DISTAL INTERPHALANGEAL JOINT DEBRIDEMENT;  Surgeon: Betha Loa, MD;  Location: [facility];  Service: Orthopedics;  Laterality: Left;   EXCISION MORTON'S NEUROMA Right 2003  approx.   INCISION AND DRAINAGE WOUND WITH FOREIGN BODY REMOVAL Left 08/30/2016   Procedure: LEFT KNEE INCISION AND  DRAINAGE WOUND WITH FOREIGN BODY REMOVAL;  Surgeon: Eugenia Mcalpine, MD;  Location: [facility];  Service: Orthopedics;  Laterality: Left;   KNEE ARTHROPLASTY Right 03/14/2017   Procedure: RIGHT TOTAL KNEE ARTHROPLASTY WITH COMPUTER NAVIGATION AND REMOVAL OF HARDWARE;  Surgeon: Samson Frederic, MD;  Location: [facility];  Service: Orthopedics;  Laterality: Right;  Adductor Block   KNEE ARTHROSCOPY W/ ACL RECONSTRUCTION Right 1996   KNEE CARTILAGE SURGERY Right age 72   LUMBAR FUSION  08/28/2020   MOHS SURGERY  02/10/2013   left cheek -- [facility]   POSTERIOR REPAIR  05-21-2002   dr Emelda Fear   symptomatic recetocele   TONSILLECTOMY AND ADENOIDECTOMY  age 74   TRANSTHORACIC ECHOCARDIOGRAM  04-17-2016  dr Diona Browner   ef 60-65%/  trivial MR/ mild TR  VAGINAL HYSTERECTOMY  1979   w/  Bilateral Salpingoophorectomy     Medications:  Outpatient Encounter Medications as of 08/08/2022  Medication Sig   acetaminophen (TYLENOL) 500 MG tablet Take by mouth.   aspirin EC 81 MG tablet Take 1 tablet (81 mg total) by mouth daily. Swallow whole.   atorvastatin (LIPITOR) 40 MG tablet TAKE 1 TABLET ONCE DAILY.   Calcium Carbonate-Vit D-Min (CALCIUM 1200 PO) Take 600 mg by mouth 2 (two) times daily.   Flaxseed, Linseed, (FLAX PO) Take 1 Dose by mouth every morning.    losartan (COZAAR) 100 MG tablet TAKE 1 TABLET BY MOUTH EVERY MORNING. (Patient taking differently: Take 100 mg by mouth daily.)   metoprolol tartrate (LOPRESSOR) 50 MG tablet TAKE (1) TABLET TWICE DAILY. (Patient taking differently: Take 50 mg by mouth 2 (two) times daily.)   Multiple Vitamins-Minerals (CENTRUM SILVER) tablet Take 1 tablet by mouth daily.   OMEGA 3 1000 MG CAPS Take 1,000 mg by mouth at bedtime.    Probiotic Product (PROBIOTIC DAILY PO) Take 1 tablet by mouth daily.   Psyllium (METAMUCIL PO) Take 1 Scoop by mouth daily.   No facility-administered encounter medications on file as of 08/08/2022.    Allergies:  Allergies   Allergen Reactions   Codeine Other (See Comments)    REACTION: syncope   Penicillins Rash    Can tolerate cephalosporins    Family History: Family History  Problem Relation Age of Onset   Hypertension Mother    CVA Mother    Throat cancer Father    Pseudochol deficiency Neg Hx    Malignant hyperthermia Neg Hx    Hypotension Neg Hx    Anesthesia problems Neg Hx    Colon cancer Neg Hx    Esophageal cancer Neg Hx     Social History: Social History   Tobacco Use   Smoking status: Former    Packs/day: 0.50    Years: 10.00    Additional pack years: 0.00    Total pack years: 5.00    Types: Cigarettes    Quit date: 04/30/1962    Years since quitting: 60.3   Smokeless tobacco: Never  Vaping Use   Vaping Use: Never used  Substance Use Topics   Alcohol use: Yes    Alcohol/week: 14.0 standard drinks of alcohol    Types: 14 Glasses of wine per week    Comment: 2 wine daily   Drug use: No   Social History   Social History Narrative   Married to her husband Ed   Retired EMT   Right handed   Drinks tea   Two story home   Two grown children, very involved with there family    Vital Signs:  BP (!) 198/117   Pulse 78   Resp 18   Ht 5\' 1"  (1.549 m)   Wt 111 lb (50.3 kg)   LMP  (LMP Unknown)   SpO2 98%   BMI 20.97 kg/m    General Medical Exam:    General:  Well appearing, comfortable.   Eyes/ENT: see cranial nerve examination.   Neck:   No carotid bruits. Respiratory:  Clear to auscultation, good air entry bilaterally.   Cardiac:  Regular rate and rhythm, no murmur.   Extremities:  No deformities, edema, or skin discoloration.  Skin:  No rashes or lesions.  Neurological Exam: MENTAL STATUS including orientation to time, place, person, recent and remote memory, attention span and concentration, language, and fund of knowledge is  normal.   Speech is not dysarthric.  CRANIAL NERVES: II:  No visual field defects.  Unremarkable fundi.   III-IV-VI: Pupils equal  round and reactive to light.  Normal conjugate, extra-ocular eye movements in all directions of gaze.  No nystagmus.  No ptosis.   V:  Normal facial sensation.    VII:  Normal facial symmetry and movements.   VIII:  Normal hearing and vestibular function.   IX-X:  Normal palatal movement.   XI:  Normal shoulder shrug and head rotation.   XII:  Normal tongue strength and range of motion, no deviation or fasciculation.  MOTOR:  No atrophy, fasciculations or abnormal movements.  No pronator drift.    SENSORY:  Normal and symmetric perception of light touch, pinprick, vibration, and proprioception.  Romberg's sign absent.   COORDINATION/GAIT:  finger-to- nose-finger and heel-to-shin. rapid alternating movements bilaterally.  Able to rise from a chair without using arms.  Gait narrow based and stable. Tandem and stressed gait intact.    Thank you for allowing me to participate in patient's care.  If I can answer any additional questions, I would be pleased to do so.    Sincerely,    Marlowe Kays

## 2022-08-08 NOTE — Therapy (Signed)
OUTPATIENT OCCUPATIONAL THERAPY ORTHO REASSESSMENT & DISCHARGE SUMMARY  Patient Name: [name] MRN: [id number] DOB:1940/09/29, 82 y.o., female Today's Date: 08/08/2022   END OF SESSION:  OT End of Session - 08/08/22 1334     Visit Number 5    Number of Visits 5    Date for OT Re-Evaluation 08/08/22    Authorization Type [employer] Medicare, $20 copay    Progress Note Due on Visit 10    OT Start Time 1305    OT Stop Time 1334    OT Time Calculation (min) 29 min    Activity Tolerance Patient tolerated treatment well    Behavior During Therapy WFL for tasks assessed/performed                Past Medical History:  Diagnosis Date   Arthritis    Basal cell carcinoma 04/29/2019   right inner cheek [facility])   Basal cell carcinoma 04/29/2019   left side nose (MOHs)   Chronic kidney disease    Diverticulosis of colon    Essential hypertension    History of basal cell carcinoma (BCC) excision    02-10-2013  left cheek  s/p  moh's sx   History of palpitations    History of recurrent UTIs    History of syncope    2015-- felt to vasovagal response to pain (per cardiologist note)   IBS (irritable bowel syndrome)    Open wound of left knee    warm soaks daily /  neosprin and dressing daily / per per still has drainage   Patellar bursitis of left knee    pre-patella septic bursitis w/ foreign body   Right thyroid nodule    x2 right side incidental finding on carotid duplex 03/ 2018-- thyroid ultrasound done , benign , follow-up in one year   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/29/20201   in situ-right upper arm-anterior   Squamous cell carcinoma of skin 11/04/2019   KA-right lower leg-anterior superior   Wears hearing aid in both ears    Past Surgical History:  Procedure Laterality Date   APPENDECTOMY  age 84   BREAST BIOPSY  1962   benign   CATARACT EXTRACTION W/PHACO  06/25/2011   Procedure: CATARACT EXTRACTION PHACO AND INTRAOCULAR LENS PLACEMENT (IOC);  Surgeon:  Susa Simmonds, MD;  Location: [facility];  Service: Ophthalmology;  Laterality: Right;  CDE: 8.90   CATARACT EXTRACTION W/PHACO Left 05/05/2012   Procedure: CATARACT EXTRACTION PHACO AND INTRAOCULAR LENS PLACEMENT (IOC);  Surgeon: Susa Simmonds, MD;  Location: [facility];  Service: Ophthalmology;  Laterality: Left;  CDE 12.78   COLONOSCOPY  last one 11-26-2012   CYST EXCISION Left 04/16/2022   Procedure: LEFT RING FINGER EXCISION MUCOID CYST AND DISTAL INTERPHALANGEAL JOINT DEBRIDEMENT;  Surgeon: Betha Loa, MD;  Location: [facility];  Service: Orthopedics;  Laterality: Left;   EXCISION MORTON'S NEUROMA Right 2003  approx.   INCISION AND DRAINAGE WOUND WITH FOREIGN BODY REMOVAL Left 08/30/2016   Procedure: LEFT KNEE INCISION AND DRAINAGE WOUND WITH FOREIGN BODY REMOVAL;  Surgeon: Eugenia Mcalpine, MD;  Location: [facility];  Service: Orthopedics;  Laterality: Left;   KNEE ARTHROPLASTY Right 03/14/2017   Procedure: RIGHT TOTAL KNEE ARTHROPLASTY WITH COMPUTER NAVIGATION AND REMOVAL OF HARDWARE;  Surgeon: Samson Frederic, MD;  Location: [facility];  Service: Orthopedics;  Laterality: Right;  Adductor Block   KNEE ARTHROSCOPY W/ ACL RECONSTRUCTION Right 1996   KNEE CARTILAGE SURGERY Right age 35  LUMBAR FUSION  08/28/2020   MOHS SURGERY  02/10/2013   left cheek -- [facility]   POSTERIOR REPAIR  05-21-2002   dr Emelda Fear   symptomatic recetocele   TONSILLECTOMY AND ADENOIDECTOMY  age 89   TRANSTHORACIC ECHOCARDIOGRAM  04-17-2016  dr Diona Browner   ef 60-65%/  trivial MR/ mild TR   VAGINAL HYSTERECTOMY  1979   w/  Bilateral Salpingoophorectomy   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 Everlene Other  REFERRING PROVIDER: Dr. Ramond Marrow  ONSET DATE: 06/04/22  REFERRING DIAG: Right partial RC Tear  THERAPY DIAG:  Acute pain of right shoulder  Other symptoms and signs involving the musculoskeletal system  Rationale for Evaluation and Treatment: Rehabilitation  SUBJECTIVE:   SUBJECTIVE STATEMENT: S: "I did yoga for the first time yesterday."   PERTINENT HISTORY: Pt is an 82 y/o female who had a fall when trying to get to the restroom overnight. Pt had an MRI which shows a partial RC tear.   PRECAUTIONS: None  WEIGHT BEARING RESTRICTIONS: No  PAIN:  Are you having pain? No  FALLS: Has patient fallen in last 6 months? Yes. Number of falls 2  PLOF: Independent  PATIENT GOALS: To have more strength and improved use of the right arm.   NEXT MD VISIT: In about a month-no appt made yet  OBJECTIVE:   HAND DOMINANCE: Left  ADLs: Overall ADLs: Pt has difficulty with overhead reaching and lifting. Pt reports some pain, more at night, tries not to roll over on the right side.   FUNCTIONAL OUTCOME MEASURES: FOTO: 59/100 08/08/22: 72/100  UPPER EXTREMITY ROM:       Assessed in sitting, er/IR adducted  Active ROM Right eval Right 08/08/22  Shoulder flexion 135 174  Shoulder abduction 153 165  Shoulder internal rotation 90 90  Shoulder external rotation 90 90  (Blank rows = not tested)    UPPER EXTREMITY MMT:     Assessed in sitting, er/IR adducted  MMT Right eval Right 08/08/22  Shoulder flexion 5/5 5/5  Shoulder abduction 4-/5 5/5  Shoulder internal rotation 5/5 5/5  Shoulder external rotation 4+/5 5/5  (Blank rows = not tested)   OBSERVATIONS: min/mod fascial restrictions in right upper arm, anterior shoulder, and trapezius regions   TODAY'S TREATMENT:                                                                                                                               DATE:  08/08/22 -Bicep curls, hammer curls, 3# 10 reps each -Shoulder strengthening: 2#, abduction, horizontal abduction, 10 reps -Therapy ball  strengthening: overhead press, chest press, circles each direction, flexion, PNF patterns, 10 reps  08/01/22 -Myofascial release to right upper arm, anterior shoulder, trapezius regions to decrease pain and fascial restrictions and improve joint ROM -P/ROM: supine-flexion, abduction, er/IR, horizontal abduction, 5 reps -Strengthening: supine, 2#-protraction, flexion, horizontal abduction, er/IR, abduction, 10 reps -Proximal shoulder strengthening: supine-2#, paddles, criss cross, circles each direction, 10 reps -Strengthening: green theraband-protraction, flexion, horizontal abduction, er/IR, abduction, 10 reps -Ball on wall: 1' flexion, 1' abduction -Overhead lacing: 2# wrist weight-seated, lacing from top down then reversing -UBE: level 2, 3' forward 3' reverse, pace: 8.0  07/27/22 -Myofascial release to right upper arm, anterior shoulder, trapezius regions to decrease pain and fascial restrictions and improve joint ROM -P/ROM: supine-flexion, abduction, er/IR, horizontal abduction, 5 reps -A/ROM: seated, flexion, abduction, protraction, horizontal abduction, er/IR, x10 -Strengthening: standing, 2#-protraction, flexion, horizontal abduction, er/IR, abduction, 10 reps -Scapular Strengthening: green band, extension, retraction, protraction, rows, x15   PATIENT EDUCATION: Education details: bicep strengthening, therapy ball strengthening Person educated: Patient Education method: Explanation, Demonstration, and Handouts Education comprehension: verbalized understanding and returned demonstration  HOME EXERCISE PROGRAM: 6/3: shoulder stretches, A/ROM 6/12: add 1 or 2# weight to A/ROM for strengthening 6/21: Scapular Strengthening 6/26: theraband strengthening-green 7/3: bicep strengthening, therapy ball  strengthening  GOALS: Goals reviewed with patient? Yes   SHORT TERM GOALS: Target date: 08/07/22  Pt will be provided with and educated on HEP to improve mobility in RUE required for use during ADL completion.   Goal status: MET  2.  Pt will increase RUE A/ROM by 10 degrees flexion to improve ability to use RUE during dressing tasks with minimal compensatory techniques.   Goal status: MET  3.  Pt will increase RUE strength to 4+/5 throughout to improve ability to use RUE during strenuous tasks like kayaking and yardwork.  Goal status: MET  4.  Pt will decrease pain in RUE to 2/10 or less to improve ability to use RUE for housework and yardwork tasks.   Goal status: MET  Pt will decrease RUE fascial restrictions to min amounts or less to improve mobility required for functional reaching tasks.   Goal status: MET    ASSESSMENT:  CLINICAL IMPRESSION: Pt reports she did yoga yesterday for the first time, no difficulties. Reassessment completed this session, pt is demonstrating full ROM and strength in the RUE, and has met all goals. She is completing all preferred tasks without difficulty, occasional bicep twinges of pain with strenuous tasks.  Continued with strengthening this session and updated HEP. Pt is agreeable to discharge with HEP today.   PERFORMANCE DEFICITS: in functional skills including in functional skills including ADLs, IADLs, coordination, tone, ROM, strength, pain, fascial restrictions, muscle spasms, and UE functional use   PLAN:  OT FREQUENCY: 1x/week  OT DURATION: 4 weeks  PLANNED INTERVENTIONS: self care/ADL training, therapeutic exercise, therapeutic activity, neuromuscular re-education, manual therapy, passive range of motion, splinting, electrical stimulation, ultrasound, moist heat, cryotherapy, patient/family education, and DME and/or AE instructions  CONSULTED AND AGREED WITH PLAN OF CARE: Patient  PLAN FOR NEXT SESSION: Discharge  today  OCCUPATIONAL THERAPY DISCHARGE SUMMARY  Visits from Start of Care: 5  Current functional level related to goals / functional outcomes: See above. Pt has met all goals and is completing all functional tasks without difficulty.    Remaining deficits: Occasional bicep pain   Education / Equipment: HEP for strengthening   Patient agrees to discharge. Patient goals were met. Patient is being discharged due to meeting  the stated rehab goals.Ezra Sites, OTR/L  [phone] 08/08/2022, 1:35 PM

## 2022-08-08 NOTE — Patient Instructions (Signed)
Strengthening Exercises: Complete 1x/day  1) Elbow flexion and extension Holding a _3__ pound weight, bend and straighten the elbow.  -Complete for bicep curls with palms forward -Complete in hammer curl formation with thumbs forward  (like you're holding a hammer)     Therapy Ball Strengthening Exercises: Complete all exercises 10-15X each, 1x/day.   1) Overhead press: Hold a medicine ball or other ball at your chest. Next, extend your elbows and push the ball over your head as shown. Return to starting position and repeat.     2) Chest press: Hold a medicine ball or other ball at your chest. Next, extend your elbows and push the ball outward in front of your body as shown. Return to starting position and repeat.     3) Ball circles:  Hold a medicine ball or other ball at your chest. Next, extend your elbows and push the ball outward in front of your body as shown. Then, move the ball in a circular pattern for several revolutions and then reverse the direction.     4) Flexion: Start holding an exercise ball out in front of your body with elbows extended. Then, slowly lift the ball up over head. Return the ball to starting position and repeat.     5) Ball diagonals: Begin holding a ball with both hands and then move it through a diagonal pattern from your hip to over your opposite shoulder as shown.

## 2022-08-13 ENCOUNTER — Ambulatory Visit (HOSPITAL_COMMUNITY)
Admission: RE | Admit: 2022-08-13 | Discharge: 2022-08-13 | Disposition: A | Discharge status: Home / Self Care | Payer: Medicare Other | Source: Ambulatory Visit | Attending: Physician Assistant | Admitting: Physician Assistant

## 2022-08-13 DIAGNOSIS — R404 Transient alteration of awareness: Secondary | ICD-10-CM | POA: Diagnosis present

## 2022-08-14 NOTE — Progress Notes (Signed)
MRI brain without acute findings. Mild atrophy, likely age related, and mild chronic age related changes in the circulation. Some arthritis in the C3-C4 area to follow with her primary.Thanks .

## 2022-08-15 ENCOUNTER — Ambulatory Visit: Payer: Medicare Other | Admitting: Neurology

## 2022-08-15 ENCOUNTER — Encounter (HOSPITAL_COMMUNITY): Payer: Medicare Other | Admitting: Occupational Therapy

## 2022-08-15 DIAGNOSIS — R404 Transient alteration of awareness: Secondary | ICD-10-CM

## 2022-08-15 NOTE — Progress Notes (Signed)
EEG complete - results pending 

## 2022-08-22 ENCOUNTER — Ambulatory Visit: Payer: Medicare Other | Admitting: Family Medicine

## 2022-08-22 NOTE — Procedures (Signed)
ELECTROENCEPHALOGRAM REPORT  Date of Study: 08/15/2022  Patient's Name: [name] MRN: [id number] Date of Birth: 11/02/1940  Referring Provider: Marlowe Kays, PA-C  Clinical History: This is an 82 year old woman with an episode of confusion/brain fog. EEG for classification.  Medications: Aspirin, Cozaar, Lipitor  Technical Summary: A multichannel digital EEG recording measured by the international 10-20 system with electrodes applied with paste and impedances below 5000 ohms performed in our laboratory with EKG monitoring in an awake and asleep patient.  Hyperventilation was not performed. Photic stimulation was performed.  The digital EEG was referentially recorded, reformatted, and digitally filtered in a variety of bipolar and referential montages for optimal display.    Description: The patient is awake and asleep during the recording.  During maximal wakefulness, there is a symmetric, medium voltage 9 Hz posterior dominant rhythm that attenuates with eye opening.  The record is symmetric.  During drowsiness and sleep, there is an increase in theta slowing of the background.  Vertex waves and symmetric sleep spindles were seen. Photic stimulation did not elicit any abnormalities.  There were no epileptiform discharges or electrographic seizures seen.    EKG lead was unremarkable.  Impression: This awake and asleep EEG is normal.    Clinical Correlation: A normal EEG does not exclude a clinical diagnosis of epilepsy.  If further clinical questions remain, prolonged EEG may be helpful.  Clinical correlation is advised.   Patrcia Dolly, M.D.

## 2022-08-23 ENCOUNTER — Telehealth: Payer: Self-pay | Admitting: Physician Assistant

## 2022-08-23 NOTE — Telephone Encounter (Signed)
Patient called to receive results/KB

## 2022-10-27 ENCOUNTER — Other Ambulatory Visit: Payer: Self-pay | Admitting: Family Medicine

## 2022-10-27 DIAGNOSIS — I499 Cardiac arrhythmia, unspecified: Secondary | ICD-10-CM

## 2022-10-27 DIAGNOSIS — I10 Essential (primary) hypertension: Secondary | ICD-10-CM

## 2022-10-29 ENCOUNTER — Encounter: Payer: Self-pay | Admitting: Physician Assistant

## 2022-10-29 ENCOUNTER — Ambulatory Visit: Payer: Medicare Other | Admitting: Physician Assistant

## 2022-10-29 VITALS — BP 198/96 | HR 80 | Resp 18 | Ht 61.0 in

## 2022-10-29 DIAGNOSIS — R404 Transient alteration of awareness: Secondary | ICD-10-CM

## 2022-10-29 NOTE — Progress Notes (Signed)
Assessment/Plan:   Post Concussion Syndrome, resolved   [name] is a very pleasant 82 y.o. LH female  with a history of thyroid nodules, hyperlipidemia, hard of hearing with hearing aids, white coat hypertension, recurrent UTIs, IBS, and recent presentation to the ED on 06/29/2022 with AMS, 3 weeks after a concussion after mechanical fall with a whiplash and imbalance, nausea and vomiting.  At the time, she had been seen at the ED, with workup remarkable for hyponatremia (126), and a negative CT scan of the head.  The rest of the workup was negative. She was seen again on 07/30/2022  due to "persistent brain fog ".  At the ED, her workup was again negative, without neurological findings.  She was then referred to [place] for further evaluation of transient alteration of awareness, versus postconcussion syndrome.  Her MoCA at the time (08/08/2022) was 22/30.  At the time of evaluation, her symptoms had improved and was able to participate on her ADLs without difficulty.  EEG was normal, without any seizure activity.  Labs were normal.  MRI of the brain, personally reviewed was remarkable for mild for age generalized cerebral atrophy, and mild chronic small vessel ischemic changes within the cerebral white matter, without evidence of acute intracranial abnormality.  There was some C3-C4 grade 1 anterolisthesis, which is being followed as an outpatient. In today's visit, the patient reports some episodes of "brain fog", but she attributes this to "low sodium". "When I take some electrolytes it improves". Patient is able to participate on his ADLs  without difficulties Discussed with patient repeating the EEG (24 h) for  further evaluation of these episodes. If negative, may consider neuropsych evaluation to rule out a cognitive issue. She is going out of town, will proceed with all these studies after her return. Recommend no driving till her EEG is normal.   Follow up in 2  months. 24 h EEG for  TAA Recommend good control of cardiovascular risk factors. She has been informed of her high BP readings today.  Continue to control mood as per PCP      History of Present Illness:   R or L handed? L handed  Initial symptoms  "I had to urinate at 5 am and was trying to run to the bathroom and hit the dresser", hitting the frontoparietal area and felt a pop in the neck, "like whip splash". She also had a partial tear of the R rotator cuff   Witnessed? No Tremor:  No  Voice: After the fall she feels she had hoarseness Hallucinations: No              visual distortions No             Auditory hallucinations? No   Taste of blood or metallic taste? No Nausea and Vomiting? After the fall , only one time Lightheadedness/ Dizziness? "Not really". "Just felt not right"             Syncope? No    Diplopia or other visual changes? No  History of encephalitis or meningitis? No  History of Headaches? "Only when I was menstruating" Loss of smell:  No Loss of taste :  No  Urinary or Bowel Incontinence :Denies Difficulty Swallowing ?No  Trouble with ADL's?"I have arthritis otherwise, no"             Trouble buttoning clothing? No   Mood Changes? Depression, more situational  Memory changes: "during that time big time"-husband says . "  She was foggy brained during that time, she was more forgetful but now is way improved". During the time following the episode she retained her personal identity Sleep:  Sleeps well. How many hours? 8               Rested upon waking up? Yes, lately. ETOH? 2 glasses at night  Tobacco? No  Recreational Drugs? No   Caffeine? No  Active: Does yoga, gardening, etc, is very active             CURRENT MEDICATIONS:  Outpatient Encounter Medications as of 10/29/2022  Medication Sig   acetaminophen (TYLENOL) 500 MG tablet Take by mouth.   aspirin EC 81 MG tablet Take 1 tablet (81 mg total) by mouth daily. Swallow whole.   atorvastatin (LIPITOR) 40 MG tablet TAKE 1  TABLET ONCE DAILY.   Calcium Carbonate-Vit D-Min (CALCIUM 1200 PO) Take 600 mg by mouth 2 (two) times daily.   Flaxseed, Linseed, (FLAX PO) Take 1 Dose by mouth every morning.    losartan (COZAAR) 100 MG tablet TAKE 1 TABLET BY MOUTH EVERY MORNING. (Patient taking differently: Take 100 mg by mouth daily.)   metoprolol tartrate (LOPRESSOR) 50 MG tablet TAKE (1) TABLET TWICE DAILY. (Patient taking differently: Take 50 mg by mouth 2 (two) times daily.)   Multiple Vitamins-Minerals (CENTRUM SILVER) tablet Take 1 tablet by mouth daily.   OMEGA 3 1000 MG CAPS Take 1,000 mg by mouth at bedtime.    Probiotic Product (PROBIOTIC DAILY PO) Take 1 tablet by mouth daily.   Psyllium (METAMUCIL PO) Take 1 Scoop by mouth daily.   No facility-administered encounter medications on file as of 10/29/2022.        No data to display            08/08/2022    5:00 PM  Montreal Cognitive Assessment   Visuospatial/ Executive (0/5) 2  Naming (0/3) 3  Attention: Read list of digits (0/2) 2  Attention: Read list of letters (0/1) 1  Attention: Serial 7 subtraction starting at 100 (0/3) 3  Language: Repeat phrase (0/2) 2  Language : Fluency (0/1) 1  Abstraction (0/2) 0  Delayed Recall (0/5) 2  Orientation (0/6) 6  Total 22  Adjusted Score (based on education) 22   Thank you for allowing [place] the opportunity to participate in the care of this nice patient. Please do not hesitate to contact us for any questions or concerns.   Total time spent on today's visit was 33 minutes dedicated to this patient today, preparing to see patient, examining the patient, ordering tests and/or medications and counseling the patient, documenting clinical information in the EHR or other health record, independently interpreting results and communicating results to the patient/family, discussing treatment and goals, answering patient's questions and coordinating care.  Cc:  Yaakov Guthrie [facility] 10/29/2022 6:38 AM

## 2022-10-29 NOTE — Patient Instructions (Signed)
It was a pleasure to see you today at our office.   Recommendations:      24 EEG Follow up in 2 months No driving for now till the results from EEG    For psychiatric meds, mood meds: Please have your primary care physician manage these medications.  If you have any severe symptoms of a stroke, or other severe issues such as confusion,severe chills or fever, etc call 911 or go to the ER as you may need to be evaluated further    For assessment of decision of mental capacity and competency:  Call Dr. Erick Blinks, geriatric psychiatrist at [phone]  Counseling regarding caregiver distress, including caregiver depression, anxiety and issues regarding community resources, adult day care programs, adult living facilities, or memory care questions:  please contact your  Primary Doctor's Social Worker   Whom to call: Memory  decline, memory medications: Call our office [phone]    [email or web address]   RECOMMENDATIONS FOR ALL PATIENTS WITH MEMORY PROBLEMS: 1. Continue to exercise (Recommend 30 minutes of walking everyday, or 3 hours every week) 2. Increase social interactions - continue going to [place] and enjoy social gatherings with friends and family 3. Eat healthy, avoid fried foods and eat more fruits and vegetables 4. Maintain adequate blood pressure, blood sugar, and blood cholesterol level. Reducing the risk of stroke and cardiovascular disease also helps promoting better memory. 5. Avoid stressful situations. Live a simple life and avoid aggravations. Organize your time and prepare for the next day in anticipation. 6. Sleep well, avoid any interruptions of sleep and avoid any distractions in the bedroom that may interfere with adequate sleep quality 7. Avoid sugar, avoid sweets as there is a strong link between excessive sugar intake, diabetes, and cognitive impairment We discussed the Mediterranean diet, which has  been shown to help patients reduce the risk of progressive memory disorders and reduces cardiovascular risk. This includes eating fish, eat fruits and green leafy vegetables, nuts like almonds and hazelnuts, walnuts, and also use olive oil. Avoid fast foods and fried foods as much as possible. Avoid sweets and sugar as sugar use has been linked to worsening of memory function.  There is always a concern of gradual progression of memory problems. If this is the case, then we may need to adjust level of care according to patient needs. Support, both to the patient and caregiver, should then be put into place.         DRIVING: Regarding driving, in patients with progressive memory problems, driving will be impaired. We advise to have someone else do the driving if trouble finding directions or if minor accidents are reported. Independent driving assessment is available to determine safety of driving.   If you are interested in the driving assessment, you can contact the following:  [facility] [employer] [facility] in [place] [phone]  [facility] [phone]  [facility] [phone]  [facility] [phone] or [phone]   FALL PRECAUTIONS: Be cautious when walking. Scan the area for obstacles that may increase the risk of trips and falls. When getting up in the mornings, sit up at the edge of the bed for a few minutes before getting out of bed. Consider elevating the bed at the head end to avoid drop of blood pressure when getting up. Walk always in a well-lit room (use night lights in the walls). Avoid area rugs or power cords from appliances in the middle of the walkways. Use a walker or a cane if necessary  and consider physical therapy for balance exercise. Get your eyesight checked regularly.  FINANCIAL OVERSIGHT: Supervision, especially oversight when making financial decisions or transactions is also recommended.  HOME SAFETY: Consider the safety  of the kitchen when operating appliances like stoves, microwave oven, and blender. Consider having supervision and share cooking responsibilities until no longer able to participate in those. Accidents with firearms and other hazards in the house should be identified and addressed as well.   ABILITY TO BE LEFT ALONE: If patient is unable to contact 911 operator, consider using LifeLine, or when the need is there, arrange for someone to stay with patients. Smoking is a fire hazard, consider supervision or cessation. Risk of wandering should be assessed by caregiver and if detected at any point, supervision and safe proof recommendations should be instituted.  MEDICATION SUPERVISION: Inability to self-administer medication needs to be constantly addressed. Implement a mechanism to ensure safe administration of the medications.      Mediterranean Diet A Mediterranean diet refers to food and lifestyle choices that are based on the traditions of countries located on the [place]. This way of eating has been shown to help prevent certain conditions and improve outcomes for people who have chronic diseases, like kidney disease and heart disease. What are tips for following this plan? Lifestyle  Cook and eat meals together with your family, when possible. Drink enough fluid to keep your urine clear or pale yellow. Be physically active every day. This includes: Aerobic exercise like running or swimming. Leisure activities like gardening, walking, or housework. Get 7-8 hours of sleep each night. If recommended by your health care provider, drink red wine in moderation. This means 1 glass a day for nonpregnant women and 2 glasses a day for men. A glass of wine equals 5 oz (150 mL). Reading food labels  Check the serving size of packaged foods. For foods such as rice and pasta, the serving size refers to the amount of cooked product, not dry. Check the total fat in packaged foods. Avoid foods  that have saturated fat or trans fats. Check the ingredients list for added sugars, such as corn syrup. Shopping  At the grocery store, buy most of your food from the areas near the walls of the store. This includes: Fresh fruits and vegetables (produce). Grains, beans, nuts, and seeds. Some of these may be available in unpackaged forms or large amounts (in bulk). Fresh seafood. Poultry and eggs. Low-fat dairy products. Buy whole ingredients instead of prepackaged foods. Buy fresh fruits and vegetables in-season from local farmers markets. Buy frozen fruits and vegetables in resealable bags. If you do not have access to quality fresh seafood, buy precooked frozen shrimp or canned fish, such as tuna, salmon, or sardines. Buy small amounts of raw or cooked vegetables, salads, or olives from the deli or salad bar at your store. Stock your pantry so you always have certain foods on hand, such as olive oil, canned tuna, canned tomatoes, rice, pasta, and beans. Cooking  Cook foods with extra-virgin olive oil instead of using butter or other vegetable oils. Have meat as a side dish, and have vegetables or grains as your main dish. This means having meat in small portions or adding small amounts of meat to foods like pasta or stew. Use beans or vegetables instead of meat in common dishes like chili or lasagna. Experiment with different cooking methods. Try roasting or broiling vegetables instead of steaming or sauteing them. Add frozen vegetables to soups, stews,  pasta, or rice. Add nuts or seeds for added healthy fat at each meal. You can add these to yogurt, salads, or vegetable dishes. Marinate fish or vegetables using olive oil, lemon juice, garlic, and fresh herbs. Meal planning  Plan to eat 1 vegetarian meal one day each week. Try to work up to 2 vegetarian meals, if possible. Eat seafood 2 or more times a week. Have healthy snacks readily available, such as: Vegetable sticks with  hummus. Greek yogurt. Fruit and nut trail mix. Eat balanced meals throughout the week. This includes: Fruit: 2-3 servings a day Vegetables: 4-5 servings a day Low-fat dairy: 2 servings a day Fish, poultry, or lean meat: 1 serving a day Beans and legumes: 2 or more servings a week Nuts and seeds: 1-2 servings a day Whole grains: 6-8 servings a day Extra-virgin olive oil: 3-4 servings a day Limit red meat and sweets to only a few servings a month What are my food choices? Mediterranean diet Recommended Grains: Whole-grain pasta. Brown rice. Bulgar wheat. Polenta. Couscous. Whole-wheat bread. Orpah Cobb. Vegetables: Artichokes. Beets. Broccoli. Cabbage. Carrots. Eggplant. Green beans. Chard. Kale. Spinach. Onions. Leeks. Peas. Squash. Tomatoes. Peppers. Radishes. Fruits: Apples. Apricots. Avocado. Berries. Bananas. Cherries. Dates. Figs. Grapes. Lemons. Melon. Oranges. Peaches. Plums. Pomegranate. Meats and other protein foods: Beans. Almonds. Sunflower seeds. Pine nuts. Peanuts. Cod. Salmon. Scallops. Shrimp. Tuna. Tilapia. Clams. Oysters. Eggs. Dairy: Low-fat milk. Cheese. Greek yogurt. Beverages: Water. Red wine. Herbal tea. Fats and oils: Extra virgin olive oil. Avocado oil. Grape seed oil. Sweets and desserts: [place] yogurt with honey. Baked apples. Poached pears. Trail mix. Seasoning and other foods: Basil. Cilantro. Coriander. Cumin. Mint. Parsley. Sage. Rosemary. Tarragon. Garlic. Oregano. Thyme. Pepper. Balsalmic vinegar. Tahini. Hummus. Tomato sauce. Olives. Mushrooms. Limit these Grains: Prepackaged pasta or rice dishes. Prepackaged cereal with added sugar. Vegetables: Deep fried potatoes (french fries). Fruits: Fruit canned in syrup. Meats and other protein foods: Beef. Pork. Lamb. Poultry with skin. Hot dogs. Tomasa Blase. Dairy: Ice cream. Sour cream. Whole milk. Beverages: Juice. Sugar-sweetened soft drinks. Beer. Liquor and spirits. Fats and oils: Butter. Canola oil.  Vegetable oil. Beef fat (tallow). Lard. Sweets and desserts: Cookies. Cakes. Pies. Candy. Seasoning and other foods: Mayonnaise. Premade sauces and marinades. The items listed may not be a complete list. Talk with your dietitian about what dietary choices are right for you. Summary The Mediterranean diet includes both food and lifestyle choices. Eat a variety of fresh fruits and vegetables, beans, nuts, seeds, and whole grains. Limit the amount of red meat and sweets that you eat. Talk with your health care provider about whether it is safe for you to drink red wine in moderation. This means 1 glass a day for nonpregnant women and 2 glasses a day for men. A glass of wine equals 5 oz (150 mL). This information is not intended to replace advice given to you by your health care provider. Make sure you discuss any questions you have with your health care provider. Document Released: 09/15/2015 Document Revised: 10/18/2015 Document Reviewed: 09/15/2015 Elsevier Interactive Patient Education  2017 [employer].       Labs today [address] [facility] [phone]

## 2022-10-31 ENCOUNTER — Other Ambulatory Visit: Payer: Self-pay | Admitting: Family Medicine

## 2022-10-31 DIAGNOSIS — I10 Essential (primary) hypertension: Secondary | ICD-10-CM

## 2022-10-31 DIAGNOSIS — I499 Cardiac arrhythmia, unspecified: Secondary | ICD-10-CM

## 2022-11-27 ENCOUNTER — Ambulatory Visit: Payer: Medicare Other | Admitting: Neurology

## 2022-11-27 ENCOUNTER — Telehealth: Payer: Self-pay | Admitting: Physician Assistant

## 2022-11-27 DIAGNOSIS — R404 Transient alteration of awareness: Secondary | ICD-10-CM

## 2022-11-27 NOTE — Telephone Encounter (Signed)
Patient is asking for lab work orders, when she comes in for her next appointment

## 2022-11-27 NOTE — Progress Notes (Signed)
Ambulatory EEG hooked up and running. Push button tested. Camera and event log explained. Batteries explained. Patient understood.

## 2022-11-28 NOTE — Telephone Encounter (Signed)
I advised patient to call PCP, she thanked me for calling back

## 2022-11-29 NOTE — Progress Notes (Signed)
 AMB EEG discontinued. No skin breakdown at [facility]. *Diary returned*

## 2022-12-14 ENCOUNTER — Telehealth: Payer: Self-pay | Admitting: Physician Assistant

## 2022-12-14 NOTE — Telephone Encounter (Signed)
The 24-hour EEG done 10/22 was normal. The episodes of "brain fuzzy" and lightheaded did not show any EEG changes.

## 2022-12-14 NOTE — Procedures (Signed)
ELECTROENCEPHALOGRAM REPORT  Dates of Recording: 11/27/2022 1:56PM to 11/28/2022 3:47PM  Patient's Name: [name] MRN: [id number] Date of Birth: 05/03/1940  Referring Provider: Marlowe Kays, PA-C  Procedure: 26-hour ambulatory video EEG  History:  This is an 82 year old woman with an episode of confusion/brain fog. EEG for classification.   Medications: Aspirin, Cozaar, Lipitor  Technical Summary: This is a 26-hour multichannel digital EEG recording measured by the international 10-20 system with electrodes applied with paste and impedances below 5000 ohms performed as portable with EKG monitoring.  The digital EEG was referentially recorded, reformatted, and digitally filtered in a variety of bipolar and referential montages for optimal display.    DESCRIPTION OF RECORDING: During maximal wakefulness, the background activity consisted of a symmetric 9 Hz posterior dominant rhythm which was reactive to eye opening.  There were no epileptiform discharges or focal slowing seen in wakefulness.  During the recording, the patient progresses through wakefulness, drowsiness, and Stage 2 sleep.  Again, there were no epileptiform discharges seen.  Events: On 10/23 at 0905 hours, she is having that "fuzzy brain" feeling. No video recorded. Electrographically, there were no EEG or EKG changes seen.  On 10/23 at 1232 hours, she feels lightheaded. No video. Electrographically, there were no EEG or EKG changes seen.   There were no electrographic seizures seen.  EKG lead was unremarkable.  IMPRESSION: This 26-hour ambulatory EEG study is normal.    CLINICAL CORRELATION: A normal EEG does not exclude a clinical diagnosis of epilepsy. Episodes of "fuzzy brain" and lightheadedness did not show any EEG correlate. If further clinical questions remain, inpatient video EEG monitoring may be helpful.   Patrcia Dolly, M.D.

## 2022-12-14 NOTE — Telephone Encounter (Signed)
Pt called in wanting to see if her EEG results were in? She is trying to make holiday plans and is wanting to get her results before making any.

## 2022-12-14 NOTE — Telephone Encounter (Signed)
Patient advised of EEG, voiced understanding and thanked me for calling.

## 2023-01-08 ENCOUNTER — Telehealth: Payer: Self-pay | Admitting: Cardiology

## 2023-01-08 NOTE — Telephone Encounter (Signed)
Pt c/o BP issue: STAT if pt c/o blurred vision, one-sided weakness or slurred speech  1. What are your last 5 BP readings?  12/03: 181/97  2. Are you having any other symptoms (ex. Dizziness, headache, blurred vision, passed out)?  Headaches, mild lightheadedness   3. What is your BP issue?   Patient states her BP has been elevated, but she has also had excess stress in her personal life recently.

## 2023-01-10 ENCOUNTER — Ambulatory Visit: Payer: Medicare Other | Admitting: Physician Assistant

## 2023-01-10 ENCOUNTER — Encounter: Payer: Self-pay | Admitting: Physician Assistant

## 2023-01-10 VITALS — BP 194/100 | HR 95 | Resp 20 | Ht 61.0 in

## 2023-01-10 DIAGNOSIS — F0781 Postconcussional syndrome: Secondary | ICD-10-CM | POA: Diagnosis not present

## 2023-01-10 DIAGNOSIS — R404 Transient alteration of awareness: Secondary | ICD-10-CM

## 2023-01-10 NOTE — Telephone Encounter (Signed)
I spoke with patient. She had recently lost her beloved cat over Thanksgiving. She got a new kitten and her stress has gone down considerably. Her repeated BP was 147/74

## 2023-01-10 NOTE — Progress Notes (Signed)
Assessment/Plan:   Post Concussion Syndrome, resolved Memory Concerns   [name] is a very pleasant 82 y.o. LH female  with a history of thyroid nodules, hyperlipidemia, hard of hearing with hearing aids, white coat hypertension, recurrent UTIs, IBS, and  prior presentation to the ED on 06/29/2022 with AMS, 3 weeks after a concussion after mechanical fall with a whiplash and imbalance, nausea and vomiting.  At the time, she had been seen at the ED, with workup remarkable for hyponatremia (126), and a negative CT scan of the head.  The rest of the workup was negative. She was seen again on 07/30/2022  due to "persistent brain fog ".  At the ED, her workup was again negative, without neurological findings.  She was then referred to Neurology. Her MoCA at the time (08/08/2022) was 22/30.  At the time of evaluation, her symptoms had improved and was able to participate on her ADLs without difficulty.  EEG was normal, without any seizure activity.   Most recent 26-hour ambulatory video EEG was negative for seizures.  Labs were normal.  MRI of the brain, personally reviewed was remarkable for mild for age generalized cerebral atrophy, and mild chronic small vessel ischemic changes within the cerebral white matter, without evidence of acute intracranial abnormality.  There was some C3-C4 grade 1 anterolisthesis, which is being followed as an outpatient. Symptoms began to improve since, and in today's visit, she reports that these have resolved "no more foggy brain". In view of symptom resolution, without other cognitive complaints and an MMSE 30/30, no further workup is indicated at this time unless memory worsened in the near future.    Recommend good control of cardiovascular risk factors. She has been informed of her high BP readings today (white coat syndrome).  Continue to control mood as per PCP     Update   Any changes in memory since last visit? "It is better", husband agrees, names . No  more foggy brain she says. Patient denies any difficulty remembering recent conversations or names  repeats oneself?  Endorsed, occasionally Disoriented when walking into a room?  Patient denies  Leaving objects in unusual places?  Patient denies   Wandering behavior?   denies   Any personality changes since last visit? Denies.   Any worsening depression?:  Lost her beloved cat which has brought sadness to her, she is experiencing situational depression.   Hallucinations or paranoia?  Denies.   Seizures?   denies    Any sleep changes?  Sleeps well . Denies  vivid dreams, REM behavior or sleepwalking   Sleep apnea?   denies   Any hygiene concerns?   denies   Independent of bathing and dressing?  Endorsed  Does the patient needs help with medications? Patient is in charge   Who is in charge of the finances?  Patient is in charge     Any changes in appetite?  Denies.     Patient have trouble swallowing?  denies .  Does the patient cook? Yes , denies forgetting common recipes . Any kitchen accidents such as leaving the stove on?   denies   Any headaches?    Denies.   Vision changes? Denies. Chronic back pain  denies.   Ambulates with difficulty?    Denies.  Continues to do Yoga which has improved balance   Recent falls or head injuries? denies     Unilateral weakness, numbness or tingling? Denies.   Any tremors?  Denies.  Any anosmia?    Denies.   Any incontinence of urine? Denies    Any bowel dysfunction?  denies      Patient lives with husband  Does the patient drive?yes, denies getting lost.       History of Present Illness:   R or L handed? L handed  Initial symptoms  "I had to urinate at 5 am and was trying to run to the bathroom and hit the dresser", hitting the frontoparietal area and felt a pop in the neck, "like whip splash". She also had a partial tear of the R rotator cuff   Witnessed? No Tremor:  No  Voice: After the fall she feels she had hoarseness Hallucinations: No               visual distortions No             Auditory hallucinations? No   Taste of blood or metallic taste? No Nausea and Vomiting? After the fall , only one time Lightheadedness/ Dizziness? "Not really". "Just felt not right"             Syncope? No    Diplopia or other visual changes? No  History of encephalitis or meningitis? No  History of Headaches? "Only when I was menstruating" Loss of smell:  No Loss of taste :  No  Urinary or Bowel Incontinence :Denies Difficulty Swallowing ?No  Trouble with ADL's?"I have arthritis otherwise, no"             Trouble buttoning clothing? No   Mood Changes? Depression, more situational  Memory changes: "during that time big time"-husband says . "She was foggy brained during that time, she was more forgetful but now is way improved". During the time following the episode she retained her personal identity Sleep:  Sleeps well. How many hours? 8               Rested upon waking up? Yes, lately. ETOH? 2 glasses at night  Tobacco? No  Recreational Drugs? No   Caffeine? No  Active: Does yoga, gardening, etc, is very active             CURRENT MEDICATIONS:  Outpatient Encounter Medications as of 01/10/2023  Medication Sig   acetaminophen (TYLENOL) 500 MG tablet Take by mouth.   aspirin EC 81 MG tablet Take 1 tablet (81 mg total) by mouth daily. Swallow whole.   atorvastatin (LIPITOR) 40 MG tablet TAKE 1 TABLET ONCE DAILY.   Calcium Carbonate-Vit D-Min (CALCIUM 1200 PO) Take 600 mg by mouth 2 (two) times daily.   Flaxseed, Linseed, (FLAX PO) Take 1 Dose by mouth every morning.    losartan (COZAAR) 100 MG tablet TAKE 1 TABLET BY MOUTH EVERY MORNING. (Patient taking differently: Take 100 mg by mouth daily.)   metoprolol tartrate (LOPRESSOR) 50 MG tablet take (1) tablet twice daily.   Multiple Vitamins-Minerals (CENTRUM SILVER) tablet Take 1 tablet by mouth daily.   OMEGA 3 1000 MG CAPS Take 1,000 mg by mouth at bedtime.    Probiotic Product  (PROBIOTIC DAILY PO) Take 1 tablet by mouth daily.   Psyllium (METAMUCIL PO) Take 1 Scoop by mouth daily.   No facility-administered encounter medications on file as of 01/10/2023.       01/10/2023    5:00 PM  MMSE - Mini Mental State Exam  Orientation to time 5  Orientation to Place 5  Registration 3  Attention/ Calculation 5  Recall 3  Language- name 2  objects 2  Language- repeat 1  Language- follow 3 step command 3  Language- read & follow direction 1  Write a sentence 1  Copy design 1  Total score 30      08/08/2022    5:00 PM  Montreal Cognitive Assessment   Visuospatial/ Executive (0/5) 2  Naming (0/3) 3  Attention: Read list of digits (0/2) 2  Attention: Read list of letters (0/1) 1  Attention: Serial 7 subtraction starting at 100 (0/3) 3  Language: Repeat phrase (0/2) 2  Language : Fluency (0/1) 1  Abstraction (0/2) 0  Delayed Recall (0/5) 2  Orientation (0/6) 6  Total 22  Adjusted Score (based on education) 22   Thank you for allowing [place] the opportunity to participate in the care of this nice patient. Please do not hesitate to contact us for any questions or concerns.   Total time spent on today's visit was 38 minutes dedicated to this patient today, preparing to see patient, examining the patient, ordering tests and/or medications and counseling the patient, documenting clinical information in the EHR or other health record, independently interpreting results and communicating results to the patient/family, discussing treatment and goals, answering patient's questions and coordinating care.  Cc:  Tisovec, Adelfa Koh, MD  Marlowe Kays 01/10/2023 5:37 PM

## 2023-01-10 NOTE — Patient Instructions (Addendum)
It was a pleasure to see you today at our office.   Recommendations:     Follow up as needed     For psychiatric meds, mood meds: Please have your primary care physician manage these medications.  If you have any severe symptoms of a stroke, or other severe issues such as confusion,severe chills or fever, etc call 911 or go to the ER as you may need to be evaluated further    For assessment of decision of mental capacity and competency:  Call Dr. Erick Blinks, geriatric psychiatrist at [phone]  Counseling regarding caregiver distress, including caregiver depression, anxiety and issues regarding community resources, adult day care programs, adult living facilities, or memory care questions:  please contact your  Primary Doctor's Social Worker   Whom to call: Memory  decline, memory medications: Call our office [phone]    [email or web address]   RECOMMENDATIONS FOR ALL PATIENTS WITH MEMORY PROBLEMS: 1. Continue to exercise (Recommend 30 minutes of walking everyday, or 3 hours every week) 2. Increase social interactions - continue going to [place] and enjoy social gatherings with friends and family 3. Eat healthy, avoid fried foods and eat more fruits and vegetables 4. Maintain adequate blood pressure, blood sugar, and blood cholesterol level. Reducing the risk of stroke and cardiovascular disease also helps promoting better memory. 5. Avoid stressful situations. Live a simple life and avoid aggravations. Organize your time and prepare for the next day in anticipation. 6. Sleep well, avoid any interruptions of sleep and avoid any distractions in the bedroom that may interfere with adequate sleep quality 7. Avoid sugar, avoid sweets as there is a strong link between excessive sugar intake, diabetes, and cognitive impairment We discussed the Mediterranean diet, which has been shown to help patients reduce the risk of  progressive memory disorders and reduces cardiovascular risk. This includes eating fish, eat fruits and green leafy vegetables, nuts like almonds and hazelnuts, walnuts, and also use olive oil. Avoid fast foods and fried foods as much as possible. Avoid sweets and sugar as sugar use has been linked to worsening of memory function.  There is always a concern of gradual progression of memory problems. If this is the case, then we may need to adjust level of care according to patient needs. Support, both to the patient and caregiver, should then be put into place.         DRIVING: Regarding driving, in patients with progressive memory problems, driving will be impaired. We advise to have someone else do the driving if trouble finding directions or if minor accidents are reported. Independent driving assessment is available to determine safety of driving.   If you are interested in the driving assessment, you can contact the following:  [facility] [employer] [facility] in [place] [phone]  [facility] [phone]  [facility] [phone]  [facility] [phone] or [phone]   FALL PRECAUTIONS: Be cautious when walking. Scan the area for obstacles that may increase the risk of trips and falls. When getting up in the mornings, sit up at the edge of the bed for a few minutes before getting out of bed. Consider elevating the bed at the head end to avoid drop of blood pressure when getting up. Walk always in a well-lit room (use night lights in the walls). Avoid area rugs or power cords from appliances in the middle of the walkways. Use a walker or a cane if necessary and consider physical therapy for balance exercise. Get your eyesight checked regularly.  FINANCIAL OVERSIGHT: Supervision, especially oversight when making financial decisions or transactions is also recommended.  HOME SAFETY: Consider the safety of the kitchen when operating appliances like  stoves, microwave oven, and blender. Consider having supervision and share cooking responsibilities until no longer able to participate in those. Accidents with firearms and other hazards in the house should be identified and addressed as well.   ABILITY TO BE LEFT ALONE: If patient is unable to contact 911 operator, consider using LifeLine, or when the need is there, arrange for someone to stay with patients. Smoking is a fire hazard, consider supervision or cessation. Risk of wandering should be assessed by caregiver and if detected at any point, supervision and safe proof recommendations should be instituted.  MEDICATION SUPERVISION: Inability to self-administer medication needs to be constantly addressed. Implement a mechanism to ensure safe administration of the medications.      Mediterranean Diet A Mediterranean diet refers to food and lifestyle choices that are based on the traditions of countries located on the [place]. This way of eating has been shown to help prevent certain conditions and improve outcomes for people who have chronic diseases, like kidney disease and heart disease. What are tips for following this plan? Lifestyle  Cook and eat meals together with your family, when possible. Drink enough fluid to keep your urine clear or pale yellow. Be physically active every day. This includes: Aerobic exercise like running or swimming. Leisure activities like gardening, walking, or housework. Get 7-8 hours of sleep each night. If recommended by your health care provider, drink red wine in moderation. This means 1 glass a day for nonpregnant women and 2 glasses a day for men. A glass of wine equals 5 oz (150 mL). Reading food labels  Check the serving size of packaged foods. For foods such as rice and pasta, the serving size refers to the amount of cooked product, not dry. Check the total fat in packaged foods. Avoid foods that have saturated fat or trans fats. Check  the ingredients list for added sugars, such as corn syrup. Shopping  At the grocery store, buy most of your food from the areas near the walls of the store. This includes: Fresh fruits and vegetables (produce). Grains, beans, nuts, and seeds. Some of these may be available in unpackaged forms or large amounts (in bulk). Fresh seafood. Poultry and eggs. Low-fat dairy products. Buy whole ingredients instead of prepackaged foods. Buy fresh fruits and vegetables in-season from local farmers markets. Buy frozen fruits and vegetables in resealable bags. If you do not have access to quality fresh seafood, buy precooked frozen shrimp or canned fish, such as tuna, salmon, or sardines. Buy small amounts of raw or cooked vegetables, salads, or olives from the deli or salad bar at your store. Stock your pantry so you always have certain foods on hand, such as olive oil, canned tuna, canned tomatoes, rice, pasta, and beans. Cooking  Cook foods with extra-virgin olive oil instead of using butter or other vegetable oils. Have meat as a side dish, and have vegetables or grains as your main dish. This means having meat in small portions or adding small amounts of meat to foods like pasta or stew. Use beans or vegetables instead of meat in common dishes like chili or lasagna. Experiment with different cooking methods. Try roasting or broiling vegetables instead of steaming or sauteing them. Add frozen vegetables to soups, stews, pasta, or rice. Add nuts or seeds for added healthy fat at each  meal. You can add these to yogurt, salads, or vegetable dishes. Marinate fish or vegetables using olive oil, lemon juice, garlic, and fresh herbs. Meal planning  Plan to eat 1 vegetarian meal one day each week. Try to work up to 2 vegetarian meals, if possible. Eat seafood 2 or more times a week. Have healthy snacks readily available, such as: Vegetable sticks with hummus. Greek yogurt. Fruit and nut trail mix. Eat  balanced meals throughout the week. This includes: Fruit: 2-3 servings a day Vegetables: 4-5 servings a day Low-fat dairy: 2 servings a day Fish, poultry, or lean meat: 1 serving a day Beans and legumes: 2 or more servings a week Nuts and seeds: 1-2 servings a day Whole grains: 6-8 servings a day Extra-virgin olive oil: 3-4 servings a day Limit red meat and sweets to only a few servings a month What are my food choices? Mediterranean diet Recommended Grains: Whole-grain pasta. Brown rice. Bulgar wheat. Polenta. Couscous. Whole-wheat bread. Orpah Cobb. Vegetables: Artichokes. Beets. Broccoli. Cabbage. Carrots. Eggplant. Green beans. Chard. Kale. Spinach. Onions. Leeks. Peas. Squash. Tomatoes. Peppers. Radishes. Fruits: Apples. Apricots. Avocado. Berries. Bananas. Cherries. Dates. Figs. Grapes. Lemons. Melon. Oranges. Peaches. Plums. Pomegranate. Meats and other protein foods: Beans. Almonds. Sunflower seeds. Pine nuts. Peanuts. Cod. Salmon. Scallops. Shrimp. Tuna. Tilapia. Clams. Oysters. Eggs. Dairy: Low-fat milk. Cheese. Greek yogurt. Beverages: Water. Red wine. Herbal tea. Fats and oils: Extra virgin olive oil. Avocado oil. Grape seed oil. Sweets and desserts: [place] yogurt with honey. Baked apples. Poached pears. Trail mix. Seasoning and other foods: Basil. Cilantro. Coriander. Cumin. Mint. Parsley. Sage. Rosemary. Tarragon. Garlic. Oregano. Thyme. Pepper. Balsalmic vinegar. Tahini. Hummus. Tomato sauce. Olives. Mushrooms. Limit these Grains: Prepackaged pasta or rice dishes. Prepackaged cereal with added sugar. Vegetables: Deep fried potatoes (french fries). Fruits: Fruit canned in syrup. Meats and other protein foods: Beef. Pork. Lamb. Poultry with skin. Hot dogs. Tomasa Blase. Dairy: Ice cream. Sour cream. Whole milk. Beverages: Juice. Sugar-sweetened soft drinks. Beer. Liquor and spirits. Fats and oils: Butter. Canola oil. Vegetable oil. Beef fat (tallow). Lard. Sweets and desserts:  Cookies. Cakes. Pies. Candy. Seasoning and other foods: Mayonnaise. Premade sauces and marinades. The items listed may not be a complete list. Talk with your dietitian about what dietary choices are right for you. Summary The Mediterranean diet includes both food and lifestyle choices. Eat a variety of fresh fruits and vegetables, beans, nuts, seeds, and whole grains. Limit the amount of red meat and sweets that you eat. Talk with your health care provider about whether it is safe for you to drink red wine in moderation. This means 1 glass a day for nonpregnant women and 2 glasses a day for men. A glass of wine equals 5 oz (150 mL). This information is not intended to replace advice given to you by your health care provider. Make sure you discuss any questions you have with your health care provider. Document Released: 09/15/2015 Document Revised: 10/18/2015 Document Reviewed: 09/15/2015 Elsevier Interactive Patient Education  2017 [employer].       Labs today [address] [facility] [phone]

## 2023-01-11 ENCOUNTER — Telehealth: Payer: Self-pay | Admitting: Physician Assistant

## 2023-01-11 NOTE — Telephone Encounter (Signed)
Pt called in stating she was told to let us know how her blood pressure was. She took it last night at 7:30 PM and it was 146/87 and her heart rate was 78. This morning at 10:00 AM her blood pressure was 130/78 and heart rate was 79.

## 2023-01-14 ENCOUNTER — Ambulatory Visit: Payer: Medicare Other | Admitting: Cardiology

## 2023-01-23 ENCOUNTER — Ambulatory Visit: Payer: Medicare Other | Admitting: Cardiology

## 2023-02-14 LAB — LAB REPORT - SCANNED: EGFR: 79.9

## 2023-02-21 ENCOUNTER — Other Ambulatory Visit (HOSPITAL_COMMUNITY): Payer: Self-pay | Admitting: Internal Medicine

## 2023-02-21 DIAGNOSIS — M810 Age-related osteoporosis without current pathological fracture: Secondary | ICD-10-CM

## 2023-03-04 ENCOUNTER — Other Ambulatory Visit (HOSPITAL_COMMUNITY): Admission: RE | Admit: 2023-03-04 | Payer: Medicare Other | Source: Ambulatory Visit

## 2023-03-04 ENCOUNTER — Ambulatory Visit: Payer: Medicare Other | Attending: Cardiology | Admitting: Cardiology

## 2023-03-04 ENCOUNTER — Encounter: Payer: Self-pay | Admitting: Cardiology

## 2023-03-04 ENCOUNTER — Ambulatory Visit (HOSPITAL_COMMUNITY)
Admission: RE | Admit: 2023-03-04 | Discharge: 2023-03-04 | Disposition: A | Discharge status: Home / Self Care | Payer: Medicare Other | Source: Ambulatory Visit | Attending: Internal Medicine | Admitting: Internal Medicine

## 2023-03-04 VITALS — BP 158/86 | HR 85 | Ht 61.0 in | Wt 117.0 lb

## 2023-03-04 DIAGNOSIS — E782 Mixed hyperlipidemia: Secondary | ICD-10-CM | POA: Diagnosis not present

## 2023-03-04 DIAGNOSIS — I10 Essential (primary) hypertension: Secondary | ICD-10-CM | POA: Diagnosis not present

## 2023-03-04 DIAGNOSIS — M810 Age-related osteoporosis without current pathological fracture: Secondary | ICD-10-CM | POA: Insufficient documentation

## 2023-03-04 DIAGNOSIS — I6522 Occlusion and stenosis of left carotid artery: Secondary | ICD-10-CM

## 2023-03-04 NOTE — Patient Instructions (Addendum)
Medication Instructions:  Your physician recommends that you continue on your current medications as directed. Please refer to the Current Medication list given to you today.  Labwork: none  Testing/Procedures: Your physician has requested that you have a carotid duplex in May 2025. This test is an ultrasound of the carotid arteries in your neck. It looks at blood flow through these arteries that supply the brain with blood. Allow one hour for this exam. There are no restrictions or special instructions.  Follow-Up: Your physician recommends that you schedule a follow-up appointment in: 6 months  Any Other Special Instructions Will Be Listed Below (If Applicable).  If you need a refill on your cardiac medications before your next appointment, please call your pharmacy.

## 2023-03-04 NOTE — Progress Notes (Signed)
    Cardiology Office Note  Date: 03/04/2023   ID: [name], DOB 10-05-1940, MRN [id number]  History of Present Illness: [name] is an 83 y.o. female last seen in May 2024 by Ms. Philis Nettle NP, I reviewed the note (I last saw her in 2021).  She is here for a routine visit.  Reports no major recurrent falls since last year.  She does yoga regularly and also stretches and core exercises in the mornings.  Remains functional with ADLs.  No exertional chest pain or palpitations, no recurrent syncope.  I reviewed her medications.  Current regimen includes Lipitor, Cozaar, Lopressor, and omega-3 supplements.  I reviewed her lab work from PCP done back in December 2024 and noted below.  LDL was 60 at that time.  She did have a head and neck CTA done back in May 2024 after her fall which demonstrated no significant RICA stenosis and less than 50% LICA stenosis.  We discussed getting a follow-up carotid Doppler this may for surveillance.  She does track blood pressure at home and has component of whitecoat hypertension.  Most recent measurement was 130/78.  Physical Exam: VS:  BP (!) 158/86   Pulse 85   Ht 5\' 1"  (1.549 m)   Wt 117 lb (53.1 kg)   LMP  (LMP Unknown)   SpO2 96%   BMI 22.11 kg/m , BMI Body mass index is 22.11 kg/m.  Wt Readings from Last 3 Encounters:  03/04/23 117 lb (53.1 kg)  08/08/22 111 lb (50.3 kg)  07/30/22 112 lb (50.8 kg)    General: Patient appears comfortable at rest. HEENT: Conjunctiva and lids normal. Neck: Supple, no elevated JVP or carotid bruits. Lungs: Clear to auscultation, nonlabored breathing at rest. Cardiac: Regular rate and rhythm, no S3 or significant systolic murmur. Extremities: No pitting edema.  ECG:  An ECG dated 07/30/2022 was personally reviewed today and demonstrated:  Sinus rhythm.  Labwork: 07/04/2022: Magnesium 1.9 07/30/2022: Hemoglobin 13.8; Platelets 204; TSH 1.945 08/08/2022: ALT 20; AST 18; BUN 10; Creatinine, Ser 0.70;  Potassium 4.2; Sodium 135     Component Value Date/Time   CHOL 180 05/28/2022 0810   CHOL 198 05/30/2021 0825   TRIG 43 05/28/2022 0810   HDL 97 05/28/2022 0810   HDL 102 05/30/2021 0825   CHOLHDL 1.9 05/28/2022 0810   VLDL 9 05/28/2022 0810   LDLCALC 74 05/28/2022 0810   LDLCALC 84 05/30/2021 0825  December 2024: Cholesterol 174, triglycerides 48, HDL 104, LDL 60, BUN 14, creatinine 0.7, AST 18, ALT 17, potassium 4.4, sodium 138, Hgb 14.2, platelets 210  Other Studies Reviewed Today:  No interval cardiac testing for review today.  Assessment and Plan:  1.  Primary hypertension.  For now would continue current doses of Cozaar and Lopressor.  She continues to track blood pressure at home and also has a component of whitecoat hypertension.  Could consider addition of either low-dose diuretic or amlodipine if additional control necessary.  Keep follow-up with PCP.  2.  Carotid artery disease.  Asymptomatic.  Obtain follow-up carotid Dopplers in May of this year.  She continues on Lipitor with recent LDL 60.  3.  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  No clear indication for reimaging at this time.  Disposition:  Follow up  6 months.  Signed, Jonelle Sidle, M.D., F.A.C.C. [facility]

## 2023-06-17 ENCOUNTER — Other Ambulatory Visit: Payer: Self-pay | Admitting: Internal Medicine

## 2023-06-17 ENCOUNTER — Telehealth: Payer: Self-pay

## 2023-06-17 ENCOUNTER — Other Ambulatory Visit: Payer: Self-pay

## 2023-06-17 DIAGNOSIS — M810 Age-related osteoporosis without current pathological fracture: Secondary | ICD-10-CM | POA: Insufficient documentation

## 2023-06-17 NOTE — Telephone Encounter (Signed)
 Auth Submission: NO AUTH NEEDED Site of care: Site of care: [facility] Payer: [employer] medicare Medication & CPT/J Code(s) submitted: Reclast (Zolendronic acid) [id number] Route of submission (phone, fax, portal): portal Phone # Fax # Auth type: Buy/Bill PB Units/visits requested: 1 dose, 5mg  Reference number: [id number] Approval from: 06/17/23 to 02/05/24

## 2023-06-20 ENCOUNTER — Encounter: Attending: Gastroenterology | Admitting: Emergency Medicine

## 2023-06-20 VITALS — BP 166/85 | HR 58 | Temp 99.0°F | Resp 16

## 2023-06-20 DIAGNOSIS — M810 Age-related osteoporosis without current pathological fracture: Secondary | ICD-10-CM | POA: Diagnosis not present

## 2023-06-20 MED ORDER — DIPHENHYDRAMINE HCL 25 MG PO CAPS
25.0000 mg | ORAL_CAPSULE | Freq: Once | ORAL | Status: AC
Start: 2023-06-20 — End: 2023-06-20
  Administered 2023-06-20: 25 mg via ORAL

## 2023-06-20 MED ORDER — ACETAMINOPHEN 325 MG PO TABS
650.0000 mg | ORAL_TABLET | Freq: Once | ORAL | Status: AC
  Administered 2023-06-20: 650 mg via ORAL

## 2023-06-20 MED ORDER — ZOLEDRONIC ACID 5 MG/100ML IV SOLN
5.0000 mg | Freq: Once | INTRAVENOUS | Status: AC
  Administered 2023-06-20: 5 mg via INTRAVENOUS

## 2023-06-20 NOTE — Progress Notes (Signed)
 Diagnosis: Osteoporosis  Provider:  Samantha Cress MD  Procedure: IV Infusion  IV Type: Peripheral, IV Location: L Antecubital  Reclast (Zolendronic Acid), Dose: 5 mg  Infusion Start Time: 1035  Infusion Stop Time: 1107  Post Infusion IV Care: Observation period completed and Peripheral IV Discontinued  Discharge: Condition: Good, Destination: Home . AVS Provided  Performed by:  Rico Charters, RN

## 2023-06-25 ENCOUNTER — Ambulatory Visit: Payer: Medicare Other | Attending: Cardiology

## 2023-06-25 DIAGNOSIS — I6522 Occlusion and stenosis of left carotid artery: Secondary | ICD-10-CM | POA: Diagnosis not present

## 2023-06-27 ENCOUNTER — Ambulatory Visit: Payer: Self-pay | Admitting: Nurse Practitioner

## 2023-08-27 ENCOUNTER — Encounter: Payer: Self-pay | Admitting: Emergency Medicine

## 2023-08-27 ENCOUNTER — Other Ambulatory Visit: Payer: Self-pay

## 2023-08-27 ENCOUNTER — Ambulatory Visit
Admission: EM | Admit: 2023-08-27 | Discharge: 2023-08-27 | Disposition: A | Discharge status: Home / Self Care | Attending: Family Medicine | Admitting: Family Medicine

## 2023-08-27 DIAGNOSIS — N390 Urinary tract infection, site not specified: Secondary | ICD-10-CM | POA: Insufficient documentation

## 2023-08-27 LAB — POCT URINALYSIS DIP (MANUAL ENTRY)
Bilirubin, UA: NEGATIVE
Glucose, UA: NEGATIVE mg/dL
Ketones, POC UA: NEGATIVE mg/dL
Nitrite, UA: NEGATIVE
Spec Grav, UA: 1.015 (ref 1.010–1.025)
Urobilinogen, UA: 0.2 U/dL
pH, UA: 7.5 (ref 5.0–8.0)

## 2023-08-27 MED ORDER — SULFAMETHOXAZOLE-TRIMETHOPRIM 800-160 MG PO TABS: 1.0000 | ORAL_TABLET | Freq: Two times a day (BID) | ORAL | 0 refills | Status: AC

## 2023-08-27 MED ORDER — SULFAMETHOXAZOLE-TRIMETHOPRIM 800-160 MG PO TABS: 1.0000 | ORAL_TABLET | Freq: Two times a day (BID) | ORAL | 0 refills | Status: DC

## 2023-08-27 NOTE — ED Triage Notes (Signed)
 Pt reports dysuria, urinary urgency and stress incontinence for last several days. Pt reports hx of similar. Pt denies abd pain, fever, nausea.

## 2023-08-27 NOTE — ED Provider Notes (Addendum)
 [facility]    CSN: [id number] Arrival date & time: 08/27/23  1451      History   Chief Complaint Chief Complaint  Patient presents with   Dysuria    HPI [name] is a 83 y.o. female.   Patient seen today with several day history of dysuria, urgency, stress incontinence.  Denies fever, chills, abdominal pain, nausea, vomiting, flank pain.  So far not trying anything over-the-counter for symptoms.  History of urinary tract infections.    Past Medical History:  Diagnosis Date   Arthritis    Basal cell carcinoma 04/29/2019   right inner cheek [facility])   Basal cell carcinoma 04/29/2019   left side nose (MOHs)   Chronic kidney disease    Diverticulosis of colon    Essential hypertension    History of basal cell carcinoma (BCC) excision    02-10-2013  left cheek  s/p  moh's sx   History of palpitations    History of recurrent UTIs    History of syncope    2015-- felt to vasovagal response to pain (per cardiologist note)   IBS (irritable bowel syndrome)    Open wound of left knee    warm soaks daily /  neosprin and dressing daily / per per still has drainage   Patellar bursitis of left knee    pre-patella septic bursitis w/ foreign body   Right thyroid  nodule    x2 right side incidental finding on carotid duplex 03/ 2018-- thyroid  ultrasound done , benign , follow-up in one year   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/29/20201   in situ-right upper arm-anterior   Squamous cell carcinoma of skin 11/04/2019   KA-right lower leg-anterior superior   Wears hearing aid in both ears     Patient Active Problem List   Diagnosis Date Noted   Senile osteoporosis 06/17/2023   Acute hyponatremia 06/28/2022   Hypokalemia 06/28/2022   Altered mental status 06/28/2022   GERD (gastroesophageal reflux disease) 09/28/2021   Mixed hyperlipidemia 06/25/2020   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   Essential hypertension 03/25/2020   S/P knee surgery 08/30/2016    History of recurrent UTIs 09/19/2015   Osteopenia 05/21/2015    Past Surgical History:  Procedure Laterality Date   APPENDECTOMY  age 16   BREAST BIOPSY  1962   benign   CATARACT EXTRACTION W/PHACO  06/25/2011   Procedure: CATARACT EXTRACTION PHACO AND INTRAOCULAR LENS PLACEMENT (IOC);  Surgeon: Dow JULIANNA Burke, MD;  Location: [facility];  Service: Ophthalmology;  Laterality: Right;  CDE: 8.90   CATARACT EXTRACTION W/PHACO Left 05/05/2012   Procedure: CATARACT EXTRACTION PHACO AND INTRAOCULAR LENS PLACEMENT (IOC);  Surgeon: Dow JULIANNA Burke, MD;  Location: [facility];  Service: Ophthalmology;  Laterality: Left;  CDE 12.78   COLONOSCOPY  last one 11-26-2012   CYST EXCISION Left 04/16/2022   Procedure: LEFT RING FINGER EXCISION MUCOID CYST AND DISTAL INTERPHALANGEAL JOINT DEBRIDEMENT;  Surgeon: Murrell Drivers, MD;  Location: [facility];  Service: Orthopedics;  Laterality: Left;   EXCISION MORTON'S NEUROMA Right 2003  approx.   INCISION AND DRAINAGE WOUND WITH FOREIGN BODY REMOVAL Left 08/30/2016   Procedure: LEFT KNEE INCISION AND DRAINAGE WOUND WITH FOREIGN BODY REMOVAL;  Surgeon: Gerome Charleston, MD;  Location: [facility];  Service: Orthopedics;  Laterality: Left;   KNEE ARTHROPLASTY Right 03/14/2017   Procedure: RIGHT TOTAL KNEE ARTHROPLASTY WITH COMPUTER NAVIGATION AND REMOVAL OF HARDWARE;  Surgeon: Fidel Rogue, MD;  Location: [facility];  Service: Orthopedics;  Laterality: Right;  Adductor Block   KNEE ARTHROSCOPY W/ ACL RECONSTRUCTION Right 1996   KNEE CARTILAGE SURGERY Right age 12   LUMBAR FUSION  08/28/2020   MOHS SURGERY  02/10/2013   left cheek -- [facility]   POSTERIOR REPAIR  05-21-2002   dr edsel   symptomatic recetocele   TONSILLECTOMY AND ADENOIDECTOMY  age 58   TRANSTHORACIC ECHOCARDIOGRAM  04-17-2016  dr debera   ef 60-65%/  trivial MR/ mild TR   VAGINAL HYSTERECTOMY  1979   w/  Bilateral Salpingoophorectomy    OB History   No obstetric history on  file.      Home Medications    Prior to Admission medications   Medication Sig Start Date End Date Taking? Authorizing Provider  acetaminophen  (TYLENOL ) 500 MG tablet Take by mouth. 06/04/17   [provider]  atorvastatin  (LIPITOR) 40 MG tablet TAKE 1 TABLET ONCE DAILY. 06/26/22   Cook, Jayce G, DO  Calcium  Carbonate-Vit D-Min (CALCIUM  1200 PO) Take 600 mg by mouth 2 (two) times daily.    [provider]  Flaxseed, Linseed, (FLAX PO) Take 1 Dose by mouth every morning.     [provider]  losartan  (COZAAR ) 100 MG tablet TAKE 1 TABLET BY MOUTH EVERY MORNING. Patient taking differently: Take 100 mg by mouth daily. 06/26/22   Cook, Jayce G, DO  metoprolol  tartrate (LOPRESSOR ) 50 MG tablet take (1) tablet twice daily. 10/29/22   Cook, Jayce G, DO  Multiple Vitamins-Minerals (CENTRUM SILVER) tablet Take 1 tablet by mouth daily.    [provider]  OMEGA 3 1000 MG CAPS Take 1,000 mg by mouth at bedtime.     [provider]  Probiotic Product (PROBIOTIC DAILY PO) Take 1 tablet by mouth daily.    [provider]  Psyllium (METAMUCIL PO) Take 1 Scoop by mouth daily.    [provider]  sulfamethoxazole -trimethoprim  (BACTRIM  DS) 800-160 MG tablet Take 1 tablet by mouth 2 (two) times daily for 3 days. 08/27/23 08/30/23  Stuart Vernell Norris, PA-C    Family History Family History  Problem Relation Age of Onset   Hypertension Mother    CVA Mother    Throat cancer Father    Pseudochol deficiency Neg Hx    Malignant hyperthermia Neg Hx    Hypotension Neg Hx    Anesthesia problems Neg Hx    Colon cancer Neg Hx    Esophageal cancer Neg Hx     Social History Social History   Tobacco Use   Smoking status: Former    Current packs/day: 0.00    Average packs/day: 0.5 packs/day for 10.0 years (5.0 ttl pk-yrs)    Types: Cigarettes    Start date: 04/29/1952    Quit date: 04/30/1962    Years since quitting: 61.3   Smokeless tobacco:  Never  Vaping Use   Vaping status: Never Used  Substance Use Topics   Alcohol  use: Yes    Alcohol /week: 14.0 standard drinks of alcohol     Types: 14 Glasses of wine per week    Comment: 2 wine daily   Drug use: No     Allergies   Other, Codeine, Hydrochlorothiazide , and Penicillins   Review of Systems Review of Systems Per HPI  Physical Exam Triage Vital Signs ED Triage Vitals  Encounter Vitals Group     BP 08/27/23 1507 (!) 176/84     Girls Systolic BP Percentile --      Girls Diastolic BP Percentile --  Boys Systolic BP Percentile --      Boys Diastolic BP Percentile --      Pulse Rate 08/27/23 1507 69     Resp 08/27/23 1507 20     Temp 08/27/23 1507 97.7 F (36.5 C)     Temp Source 08/27/23 1507 Oral     SpO2 08/27/23 1507 96 %     Weight --      Height --      Head Circumference --      Peak Flow --      Pain Score 08/27/23 1509 2     Pain Loc --      Pain Education --      Exclude from Growth Chart --    No data found.  Updated Vital Signs BP (!) 176/84 (BP Location: Right Arm)   Pulse 69   Temp 97.7 F (36.5 C) (Oral)   Resp 20   LMP  (LMP Unknown)   SpO2 96%   Visual Acuity Right Eye Distance:   Left Eye Distance:   Bilateral Distance:    Right Eye Near:   Left Eye Near:    Bilateral Near:     Physical Exam Vitals and nursing note reviewed.  Constitutional:      Appearance: Normal appearance. She is not ill-appearing.  HENT:     Head: Atraumatic.     Mouth/Throat:     Mouth: Mucous membranes are moist.  Eyes:     Extraocular Movements: Extraocular movements intact.     Conjunctiva/sclera: Conjunctivae normal.  Cardiovascular:     Rate and Rhythm: Normal rate.  Pulmonary:     Effort: Pulmonary effort is normal.  Abdominal:     General: Bowel sounds are normal. There is no distension.     Palpations: Abdomen is soft.     Tenderness: There is no abdominal tenderness. There is no right CVA tenderness, left CVA tenderness or  guarding.  Musculoskeletal:        General: Normal range of motion.     Cervical back: Normal range of motion and neck supple.  Skin:    General: Skin is warm and dry.  Neurological:     Mental Status: She is alert and oriented to person, place, and time.  Psychiatric:        Mood and Affect: Mood normal.        Thought Content: Thought content normal.        Judgment: Judgment normal.    UC Treatments / Results  Labs (all labs ordered are listed, but only abnormal results are displayed) Labs Reviewed  POCT URINALYSIS DIP (MANUAL ENTRY) - Abnormal; Notable for the following components:      Result Value   Clarity, UA cloudy (*)    Blood, UA trace-intact (*)    Protein Ur, POC trace (*)    Leukocytes, UA Large (3+) (*)    All other components within normal limits  URINE CULTURE    EKG  Radiology No results found.  Procedures Procedures (including critical care time)  Medications Ordered in UC Medications - No data to display  Initial Impression / Assessment and Plan / UC Course  I have reviewed the triage vital signs and the nursing notes.  Pertinent labs & imaging results that were available during my care of the patient were reviewed by me and considered in my medical decision making (see chart for details).     Urinalysis today with evidence of a possible UTI.  She with Bactrim , fluids, await urine culture and adjust if needed.  Return for worsening symptoms.  Final Clinical Impressions(s) / UC Diagnoses   Final diagnoses:  Acute lower UTI   Discharge Instructions   None    ED Prescriptions     Medication Sig Dispense Auth. Provider   sulfamethoxazole -trimethoprim  (BACTRIM  DS) 800-160 MG tablet  (Status: Discontinued) Take 1 tablet by mouth 2 (two) times daily for 3 days. 6 tablet Stuart Vernell Norris, PA-C   sulfamethoxazole -trimethoprim  (BACTRIM  DS) 800-160 MG tablet Take 1 tablet by mouth 2 (two) times daily for 3 days. 6 tablet Stuart Vernell Norris, [place]      PDMP not reviewed this encounter.   Stuart Vernell Norris, [place] 08/27/23 1552    Stuart Vernell Norris, [place] 08/27/23 1554

## 2023-08-29 ENCOUNTER — Ambulatory Visit (HOSPITAL_COMMUNITY): Payer: Self-pay

## 2023-08-29 LAB — URINE CULTURE: Culture: >=100000 — AB

## 2023-11-12 ENCOUNTER — Ambulatory Visit: Attending: Cardiology | Admitting: Cardiology

## 2023-11-12 ENCOUNTER — Encounter: Payer: Self-pay | Admitting: Cardiology

## 2023-11-12 VITALS — BP 148/86 | HR 72 | Ht 68.0 in | Wt 114.6 lb

## 2023-11-12 DIAGNOSIS — E782 Mixed hyperlipidemia: Secondary | ICD-10-CM | POA: Diagnosis not present

## 2023-11-12 DIAGNOSIS — Z79899 Other long term (current) drug therapy: Secondary | ICD-10-CM | POA: Diagnosis not present

## 2023-11-12 DIAGNOSIS — I10 Essential (primary) hypertension: Secondary | ICD-10-CM

## 2023-11-12 NOTE — Progress Notes (Signed)
    Cardiology Office Note  Date: 11/12/2023   ID: ARAYLA KRUSCHKE, DOB 1940-12-08, MRN [id number]  History of Present Illness: [name] is an 83 y.o. female last seen in January.  She is here for a follow-up visit.  Reports no exertional chest pain or unusual shortness of breath.  Continues to enjoy yoga.  She tracks her blood pressure at home, recent systolics in the 130s to 140s.  I reviewed her medications, no change in antihypertensive regimen or lipid-lowering regimen.  She did have lab work back in January at which point her HDL was 104 and LDL 60.  No interval lab work for review.  I reviewed her ECG today which shows normal sinus rhythm with decreased R wave progression.  Physical Exam: VS:  BP (!) 148/86 (BP Location: Left Arm)   Pulse 72   Ht 5' 8 (1.727 m)   Wt 114 lb 9.6 oz (52 kg)   LMP  (LMP Unknown)   BMI 17.42 kg/m , BMI Body mass index is 17.42 kg/m.  Wt Readings from Last 3 Encounters:  11/12/23 114 lb 9.6 oz (52 kg)  03/04/23 117 lb (53.1 kg)  08/08/22 111 lb (50.3 kg)    General: Patient appears comfortable at rest. HEENT: Conjunctiva and lids normal. Neck: Supple, no elevated JVP or carotid bruits. Lungs: Clear to auscultation, nonlabored breathing at rest. Cardiac: Regular rate and rhythm, no S3 or significant systolic murmur. Extremities: No pitting edema.  ECG:  An ECG dated 07/30/2022 was personally reviewed today and demonstrated:  Sinus rhythm.  Labwork:    Component Value Date/Time   CHOL 180 05/28/2022 0810   CHOL 198 05/30/2021 0825   TRIG 43 05/28/2022 0810   HDL 97 05/28/2022 0810   HDL 102 05/30/2021 0825   CHOLHDL 1.9 05/28/2022 0810   VLDL 9 05/28/2022 0810   LDLCALC 74 05/28/2022 0810   LDLCALC 84 05/30/2021 0825  January 2025: Cholesterol 174, triglycerides 48, HDL 104, LDL 60  Other Studies Reviewed Today:  No interval cardiac testing for review today.  Assessment and Plan:  1.  Primary hypertension.  Also  component of whitecoat hypertension.  At this point plan to continue Cozaar  100 mg daily and Lopressor  50 mg twice daily.  Check BMET.   2.  Carotid artery disease.  Asymptomatic.  Carotid Dopplers in May showed no significant RICA stenosis and 1 to 39% LICA stenosis.  Continue Lipitor 40 mg daily.  LDL 60 in January.   3.  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  Disposition:  Follow up 6 months.  Signed, Jayson JUDITHANN Sierras, M.D., F.A.C.C. [facility]

## 2023-11-12 NOTE — Patient Instructions (Addendum)
 Medication Instructions:  Your physician recommends that you continue on your current medications as directed. Please refer to the Current Medication list given to you today.  Labwork: BMET Non-fasting [facility] ([address] [place])  Testing/Procedures: none  Follow-Up: Your physician recommends that you schedule a follow-up appointment in: 6 months  Any Other Special Instructions Will Be Listed Below (If Applicable).  If you need a refill on your cardiac medications before your next appointment, please call your pharmacy.

## 2023-11-14 ENCOUNTER — Other Ambulatory Visit (HOSPITAL_COMMUNITY): Payer: Self-pay | Admitting: Internal Medicine

## 2023-11-14 DIAGNOSIS — Z1231 Encounter for screening mammogram for malignant neoplasm of breast: Secondary | ICD-10-CM

## 2023-11-15 ENCOUNTER — Ambulatory Visit (HOSPITAL_COMMUNITY)
Admission: RE | Admit: 2023-11-15 | Discharge: 2023-11-15 | Disposition: A | Discharge status: Home / Self Care | Source: Ambulatory Visit | Attending: Internal Medicine | Admitting: Internal Medicine

## 2023-11-15 DIAGNOSIS — Z1231 Encounter for screening mammogram for malignant neoplasm of breast: Secondary | ICD-10-CM | POA: Diagnosis present

## 2023-11-16 ENCOUNTER — Ambulatory Visit: Payer: Self-pay | Admitting: Cardiology

## 2023-11-16 LAB — BASIC METABOLIC PANEL WITH GFR
BUN/Creatinine Ratio: 18 (ref 12–28)
BUN: 13 mg/dL (ref 8–27)
CO2: 25 mmol/L (ref 20–29)
Calcium: 9.9 mg/dL (ref 8.7–10.3)
Chloride: 96 mmol/L (ref 96–106)
Creatinine, Ser: 0.73 mg/dL (ref 0.57–1.00)
Glucose: 87 mg/dL (ref 70–99)
Potassium: 4.4 mmol/L (ref 3.5–5.2)
Sodium: 136 mmol/L (ref 134–144)
eGFR: 81 mL/min/1.73 (ref >59)

## 2023-12-01 ENCOUNTER — Telehealth: Payer: Self-pay

## 2023-12-01 NOTE — Telephone Encounter (Signed)
 45F w/ HTN, mild cartoid stenosis, nonobstructive renal artery stenosis, GERD who paged regarding diarrhea and hypertension. Reports diarrhea starting this morning (6 episodes), otherwise felt well. Was able to stay hydrated with water . She checked her BP at 3pm which was 185/104 with HR in 60s, then on repeat 168/104; at 5:45pm, BP was 210/113 and repeat 201/111 with HR 63. Usual BP at home is 120-130s systolic. Denies abdominal pain, headache, numbness/tingling, weakness, chest pain, dyspnea. Able to get down PO (including Powerade). Took her metoprolol  this AM; due for metoprolol  and losartan  now.   Advised patient to take metoprolol  and losartan  now and repeat BP in 1 hour; if SBP >180 or 190, advised she go to ER. Also advised her to go to the ER if she develops any chest pain, dyspnea, weakness, numbness/tingling, headache, or she is unable to keep down PO. Advised her to visit her PCP this week to follow up on the diarrhea and HTN as well. She expressed understanding.

## 2023-12-26 ENCOUNTER — Encounter: Payer: Self-pay | Admitting: Family Medicine

## 2023-12-26 ENCOUNTER — Ambulatory Visit (INDEPENDENT_AMBULATORY_CARE_PROVIDER_SITE_OTHER): Admitting: Family Medicine

## 2023-12-26 VITALS — BP 135/76 | HR 66 | Temp 98.4°F | Ht 68.0 in | Wt 115.8 lb

## 2023-12-26 DIAGNOSIS — M47816 Spondylosis without myelopathy or radiculopathy, lumbar region: Secondary | ICD-10-CM | POA: Insufficient documentation

## 2023-12-26 DIAGNOSIS — J069 Acute upper respiratory infection, unspecified: Secondary | ICD-10-CM

## 2023-12-26 DIAGNOSIS — I6523 Occlusion and stenosis of bilateral carotid arteries: Secondary | ICD-10-CM

## 2023-12-26 DIAGNOSIS — K551 Chronic vascular disorders of intestine: Secondary | ICD-10-CM | POA: Insufficient documentation

## 2023-12-26 DIAGNOSIS — I10 Essential (primary) hypertension: Secondary | ICD-10-CM | POA: Diagnosis not present

## 2023-12-26 DIAGNOSIS — E042 Nontoxic multinodular goiter: Secondary | ICD-10-CM

## 2023-12-26 DIAGNOSIS — E782 Mixed hyperlipidemia: Secondary | ICD-10-CM

## 2023-12-26 DIAGNOSIS — M810 Age-related osteoporosis without current pathological fracture: Secondary | ICD-10-CM

## 2023-12-26 DIAGNOSIS — I739 Peripheral vascular disease, unspecified: Secondary | ICD-10-CM | POA: Diagnosis not present

## 2023-12-26 LAB — LIPID PANEL

## 2023-12-26 MED ORDER — LEVOCETIRIZINE DIHYDROCHLORIDE 5 MG PO TABS: 2.5000 mg | ORAL_TABLET | Freq: Every evening | ORAL | 1 refills | Status: DC

## 2023-12-26 NOTE — Progress Notes (Signed)
 New Patient Office Visit  Subjective    Patient ID: [name], female    DOB: Nov 14, 1940  Age: 83 y.o. MRN: [id number]  CC:  Chief Complaint  Patient presents with   Medical Management of Chronic Issues    HPI [name] presents to establish care  History of Present Illness   [name] is an 83 year old female who presents with sinus congestion.  Upper respiratory symptoms - Sinus congestion for the past 48 hours - Associated with runny nose, sneezing, and post-nasal drip - No ear pain, sore throat, fever, chills, chest pain, or shortness of breath - No over-the-counter medications taken for these symptoms  Hypertension and cardiovascular monitoring - Hypertension well-controlled at home, but elevated in the office - Regular home blood pressure monitoring - Followed by cardiologist for carotid artery stenosis, monitored with Doppler studies - Takes losartan  and metoprolol  for blood pressure management - Hx of white coat syndrome  Thyroid  nodules and electrolyte imbalance - History of thyroid  nodules, previously biopsied and non-cancerous - Thyroid  function tests normal as of January 2025 - History of hyponatremia, managed with electrolyte drinks and salty foods - Experiences cognitive fog when sodium levels are low  Atherosclerotic disease, PAD - Takes atorvastatin  for cholesterol management due to carotid and abdominal artery stenosis - History of peripheral artery disease  Osteoporosis and musculoskeletal injuries - Received Reclast  infusion for osteoporosis in May 2025 - Takes 600 mg calcium  daily  - Does weight bearing exercise daily       Outpatient Encounter Medications as of 12/26/2023  Medication Sig   acetaminophen  (TYLENOL ) 500 MG tablet Take by mouth.   atorvastatin  (LIPITOR) 40 MG tablet TAKE 1 TABLET ONCE DAILY.   Calcium  Carbonate-Vit D-Min (CALCIUM  1200 PO) Take 600 mg by mouth 2 (two) times daily.   Flaxseed, Linseed, (FLAX PO)  Take 1 Dose by mouth every morning.    losartan  (COZAAR ) 100 MG tablet TAKE 1 TABLET BY MOUTH EVERY MORNING.   metoprolol  tartrate (LOPRESSOR ) 50 MG tablet take (1) tablet twice daily.   Multiple Vitamins-Minerals (CENTRUM SILVER) tablet Take 1 tablet by mouth daily.   OMEGA 3 1000 MG CAPS Take 1,000 mg by mouth at bedtime.    Probiotic Product (PROBIOTIC DAILY PO) Take 1 tablet by mouth daily.   Psyllium (METAMUCIL PO) Take 1 Scoop by mouth daily.   No facility-administered encounter medications on file as of 12/26/2023.    Past Medical History:  Diagnosis Date   Arthritis    Basal cell carcinoma 04/29/2019   right inner cheek [facility])   Basal cell carcinoma 04/29/2019   left side nose [facility])   Cataract    Chronic kidney disease    Diverticulosis of colon    Essential hypertension    History of basal cell carcinoma (BCC) excision    02-10-2013  left cheek  s/p  moh's sx   History of palpitations    History of recurrent UTIs    History of syncope    2015-- felt to vasovagal response to pain (per cardiologist note)   Hyperlipidemia    IBS (irritable bowel syndrome)    Open wound of left knee    warm soaks daily /  neosprin and dressing daily / per per still has drainage   Osteoporosis    Patellar bursitis of left knee    pre-patella septic bursitis w/ foreign body   Right thyroid  nodule    x2 right side incidental finding on carotid  duplex 03/ 2018-- thyroid  ultrasound done , benign , follow-up in one year   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/29/20201   in situ-right upper arm-anterior   Squamous cell carcinoma of skin 11/04/2019   KA-right lower leg-anterior superior   Wears hearing aid in both ears     Past Surgical History:  Procedure Laterality Date   APPENDECTOMY  age 57   BREAST BIOPSY  1962   benign   CATARACT EXTRACTION W/PHACO  06/25/2011   Procedure: CATARACT EXTRACTION PHACO AND INTRAOCULAR LENS PLACEMENT (IOC);  Surgeon: Dow JULIANNA Burke, MD;  Location: [facility];  Service: Ophthalmology;  Laterality: Right;  CDE: 8.90   CATARACT EXTRACTION W/PHACO Left 05/05/2012   Procedure: CATARACT EXTRACTION PHACO AND INTRAOCULAR LENS PLACEMENT (IOC);  Surgeon: Dow JULIANNA Burke, MD;  Location: [facility];  Service: Ophthalmology;  Laterality: Left;  CDE 12.78   COLONOSCOPY  last one 11-26-2012   CYST EXCISION Left 04/16/2022   Procedure: LEFT RING FINGER EXCISION MUCOID CYST AND DISTAL INTERPHALANGEAL JOINT DEBRIDEMENT;  Surgeon: Murrell Drivers, MD;  Location: [facility];  Service: Orthopedics;  Laterality: Left;   EXCISION MORTON'S NEUROMA Right 2003  approx.   INCISION AND DRAINAGE WOUND WITH FOREIGN BODY REMOVAL Left 08/30/2016   Procedure: LEFT KNEE INCISION AND DRAINAGE WOUND WITH FOREIGN BODY REMOVAL;  Surgeon: Gerome Charleston, MD;  Location: [facility];  Service: Orthopedics;  Laterality: Left;   KNEE ARTHROPLASTY Right 03/14/2017   Procedure: RIGHT TOTAL KNEE ARTHROPLASTY WITH COMPUTER NAVIGATION AND REMOVAL OF HARDWARE;  Surgeon: Fidel Rogue, MD;  Location: [facility];  Service: Orthopedics;  Laterality: Right;  Adductor Block   KNEE ARTHROSCOPY W/ ACL RECONSTRUCTION Right 1996   KNEE CARTILAGE SURGERY Right age 75   LUMBAR FUSION  08/28/2020   MOHS SURGERY  02/10/2013   left cheek -- [facility]   POSTERIOR REPAIR  05-21-2002   dr edsel   symptomatic recetocele   TONSILLECTOMY AND ADENOIDECTOMY  age 2   TRANSTHORACIC ECHOCARDIOGRAM  04-17-2016  dr debera   ef 60-65%/  trivial MR/ mild TR   VAGINAL HYSTERECTOMY  1979   w/  Bilateral Salpingoophorectomy    Family History  Problem Relation Age of Onset   Hypertension Mother    CVA Mother    Throat cancer Father    Heart attack Maternal Grandfather    Pseudochol deficiency Neg Hx    Malignant hyperthermia Neg Hx    Hypotension Neg Hx    Anesthesia problems Neg Hx    Colon cancer Neg Hx    Esophageal cancer Neg Hx     Social History   Socioeconomic History   Marital  status: Married    Spouse name: Not on file   Number of children: 2   Years of education: 14   Highest education level: Associate degree: academic program  Occupational History   Occupation: retired  Tobacco Use   Smoking status: Former    Current packs/day: 0.00    Average packs/day: 0.5 packs/day for 10.0 years (5.0 ttl pk-yrs)    Types: Cigarettes    Start date: 04/29/1952    Quit date: 04/30/1962    Years since quitting: 61.6   Smokeless tobacco: Never  Vaping Use   Vaping status: Never Used  Substance and Sexual Activity   Alcohol  use: Yes    Alcohol /week: 14.0 standard drinks of alcohol     Types: 14 Glasses of wine per week    Comment: 2 wine daily   Drug  use: No   Sexual activity: Yes    Birth control/protection: Surgical  Other Topics Concern   Not on file  Social History Narrative   Married to her husband [name]   Retired EMT   Right handed   Drinks tea   Two story home   Two grown children, very involved with there family   Social Drivers of Corporate Investment Banker Strain: Low Risk  (12/25/2023)   Overall Financial Resource Strain (CARDIA)    Difficulty of Paying Living Expenses: Not hard at all  Food Insecurity: No Food Insecurity (12/25/2023)   Hunger Vital Sign    Worried About Running Out of Food in the Last Year: Never true    Ran Out of Food in the Last Year: Never true  Transportation Needs: No Transportation Needs (12/25/2023)   PRAPARE - Administrator, Civil Service (Medical): No    Lack of Transportation (Non-Medical): No  Physical Activity: Sufficiently Active (12/25/2023)   Exercise Vital Sign    Days of Exercise per Week: 6 days    Minutes of Exercise per Session: 60 min  Stress: No Stress Concern Present (12/25/2023)   [employer] of Occupational Health - Occupational Stress Questionnaire    Feeling of Stress: Only a little  Social Connections: Moderately Isolated (12/25/2023)   Social Connection and Isolation Panel     Frequency of Communication with Friends and Family: More than three times a week    Frequency of Social Gatherings with Friends and Family: More than three times a week    Attends Religious Services: Never    Database Administrator or Organizations: No    Attends Engineer, Structural: Not on file    Marital Status: Married  Catering Manager Violence: Not At Risk (06/28/2022)   Humiliation, Afraid, Rape, and Kick questionnaire    Fear of Current or Ex-Partner: No    Emotionally Abused: No    Physically Abused: No    Sexually Abused: No    ROS As per HPI.      Objective    BP 135/76 Comment: at home reading per pt  Pulse 66   Temp 98.4 F (36.9 C) (Temporal)   Ht 5' 8 (1.727 m)   Wt 115 lb 12.8 oz (52.5 kg)   LMP  (LMP Unknown)   SpO2 99%   BMI 17.61 kg/m   Physical Exam Vitals and nursing note reviewed.  Constitutional:      General: She is not in acute distress.    Appearance: She is not ill-appearing, toxic-appearing or diaphoretic.  HENT:     Right Ear: Tympanic membrane, ear canal and external ear normal.     Left Ear: Ear canal and external ear normal.     Nose: Congestion present.     Mouth/Throat:     Pharynx: Oropharynx is clear. No oropharyngeal exudate or posterior oropharyngeal erythema.  Eyes:     General:        Right eye: No discharge.        Left eye: No discharge.     Conjunctiva/sclera: Conjunctivae normal.  Cardiovascular:     Rate and Rhythm: Normal rate and regular rhythm.     Pulses: Normal pulses.     Heart sounds: Normal heart sounds. No murmur heard. Pulmonary:     Effort: Pulmonary effort is normal. No respiratory distress.     Breath sounds: Normal breath sounds.  Abdominal:     General: Bowel sounds are  normal. There is no distension.     Palpations: Abdomen is soft. There is no mass.     Tenderness: There is no abdominal tenderness. There is no guarding or rebound.  Musculoskeletal:     Cervical back: Neck supple. No  tenderness.     Right lower leg: No edema.     Left lower leg: No edema.  Lymphadenopathy:     Cervical: No cervical adenopathy.  Skin:    General: Skin is warm and dry.  Neurological:     General: No focal deficit present.     Mental Status: She is alert and oriented to person, place, and time.  Psychiatric:        Mood and Affect: Mood normal.        Behavior: Behavior normal.         Assessment & Plan:   [name] was seen today for medical management of chronic issues.  Diagnoses and all orders for this visit:  Primary hypertension -     CBC with Differential/Platelet -     CMP14+EGFR  Mixed hyperlipidemia -     Lipid panel  Multinodular goiter (nontoxic) -     TSH + free T4  PAD (peripheral artery disease)  Bilateral carotid artery stenosis  Acute URI -     levocetirizine (XYZAL ) 5 MG tablet; Take 0.5 tablets (2.5 mg total) by mouth every evening.  Senile osteoporosis -     AMB Referral [facility] Care Management   Assessment and Plan    Acute upper respiratory infection Symptoms consistent with viral infection, likely to resolve in 7-10 days. - Prescribed Xyzal , half a tablet in the evening for symptomatic relief. - Advised to monitor symptoms and consider antibiotics if symptoms persist beyond a week.  Primary hypertension Blood pressure elevated in office, likely due to white coat syndrome. Home readings well-controlled. - Continue current antihypertensive regimen with losartan  and metoprolol .  Mixed hyperlipidemia with carotid and abdominal artery stenosis PAD Managed with atorvastatin . - Continue atorvastatin . - Ordered lab work to check cholesterol levels.  Nontoxic multinodular goiter Thyroid  nodules non-cancerous. Last TSH and free T4 levels normal. - Ordered lab work to check thyroid  levels.  Age-related osteoporosis, status post Reclast  infusion Received Reclast  infusion in May 2025. Discussed alternative treatment options including Prolia. -  Referred to clinical pharmacist Mliss for discussion of osteoporosis treatment options and insurance approval.  Hyponatremia Intermittent hyponatremia managed with electrolyte drinks and dietary salt intake. - Ordered lab work to check sodium levels. - Continue current management with electrolyte drinks and dietary salt.  General Health Maintenance Engages in regular physical activity and maintains a healthy diet. - Continue current exercise and dietary regimen.       Return in about 6 months (around 06/24/2024) for chronic follow up.   Melissa CHRISTELLA Search, FNP

## 2023-12-27 ENCOUNTER — Ambulatory Visit: Payer: Self-pay | Admitting: Family Medicine

## 2023-12-27 DIAGNOSIS — M5416 Radiculopathy, lumbar region: Secondary | ICD-10-CM | POA: Insufficient documentation

## 2023-12-27 LAB — CMP14+EGFR
ALT: 16 IU/L (ref 0–32)
AST: 20 IU/L (ref 0–40)
Albumin: 5 g/dL — ABNORMAL HIGH (ref 3.7–4.7)
Alkaline Phosphatase: 61 IU/L (ref 48–129)
BUN/Creatinine Ratio: 16 (ref 12–28)
BUN: 11 mg/dL (ref 8–27)
Bilirubin Total: 1 mg/dL (ref 0.0–1.2)
CO2: 26 mmol/L (ref 20–29)
Calcium: 10 mg/dL (ref 8.7–10.3)
Chloride: 97 mmol/L (ref 96–106)
Creatinine, Ser: 0.67 mg/dL (ref 0.57–1.00)
Globulin, Total: 2.1 g/dL (ref 1.5–4.5)
Glucose: 92 mg/dL (ref 70–99)
Potassium: 4.3 mmol/L (ref 3.5–5.2)
Sodium: 137 mmol/L (ref 134–144)
Total Protein: 7.1 g/dL (ref 6.0–8.5)
eGFR: 86 mL/min/1.73 (ref >59)

## 2023-12-27 LAB — CBC WITH DIFFERENTIAL/PLATELET
Basophils Absolute: 0 x10E3/uL (ref 0.0–0.2)
Basos: 1 %
EOS (ABSOLUTE): 0.1 x10E3/uL (ref 0.0–0.4)
Eos: 1 %
Hematocrit: 45.1 % (ref 34.0–46.6)
Hemoglobin: 14.8 g/dL (ref 11.1–15.9)
Immature Grans (Abs): 0 x10E3/uL (ref 0.0–0.1)
Immature Granulocytes: 0 %
Lymphocytes Absolute: 1.2 x10E3/uL (ref 0.7–3.1)
Lymphs: 20 %
MCH: 32.8 pg (ref 26.6–33.0)
MCHC: 32.8 g/dL (ref 31.5–35.7)
MCV: 100 fL — ABNORMAL HIGH (ref 79–97)
Monocytes Absolute: 0.9 x10E3/uL (ref 0.1–0.9)
Monocytes: 16 %
Neutrophils Absolute: 3.6 x10E3/uL (ref 1.4–7.0)
Neutrophils: 61 %
Platelets: 203 x10E3/uL (ref 150–450)
RBC: 4.51 x10E6/uL (ref 3.77–5.28)
RDW: 12 % (ref 11.7–15.4)
WBC: 5.8 x10E3/uL (ref 3.4–10.8)

## 2023-12-27 LAB — TSH+FREE T4
Free T4: 1.33 ng/dL (ref 0.82–1.77)
TSH: 2.09 u[IU]/mL (ref 0.450–4.500)

## 2023-12-27 LAB — LIPID PANEL
Chol/HDL Ratio: 1.6 ratio (ref 0.0–4.4)
Cholesterol, Total: 188 mg/dL (ref 100–199)
HDL: 114 mg/dL (ref >39)
LDL Chol Calc (NIH): 62 mg/dL (ref 0–99)
Triglycerides: 66 mg/dL (ref 0–149)
VLDL Cholesterol Cal: 12 mg/dL (ref 5–40)

## 2023-12-30 ENCOUNTER — Encounter: Payer: Self-pay | Admitting: Family Medicine

## 2024-01-07 ENCOUNTER — Ambulatory Visit

## 2024-01-07 VITALS — BP 135/76 | HR 66 | Ht 68.0 in | Wt 115.0 lb

## 2024-01-07 DIAGNOSIS — Z0000 Encounter for general adult medical examination without abnormal findings: Secondary | ICD-10-CM

## 2024-01-07 NOTE — Progress Notes (Signed)
 Chief Complaint  Patient presents with   Medicare Wellness     Subjective:   [name] is a 83 y.o. female who presents for a Medicare Annual Wellness Visit.  Visit info / Clinical Intake: Medicare Wellness Visit Type:: Subsequent Annual Wellness Visit Persons participating in visit and providing information:: patient Medicare Wellness Visit Mode:: Telephone If telephone:: video declined Since this visit was completed virtually, some vitals may be partially provided or unavailable. Missing vitals are due to the limitations of the virtual format.: Documented vitals are patient reported If Telephone or Video please confirm:: I connected with patient using audio/video enable telemedicine. I verified patient identity with two identifiers, discussed telehealth limitations, and patient agreed to proceed. Patient Location:: home Provider Location:: home office Interpreter Needed?: No Pre-visit prep was completed: yes AWV questionnaire completed by patient prior to visit?: yes Date:: 01/07/24 Living arrangements:: lives with spouse/significant other Patient's Overall Health Status Rating: very good Typical amount of pain: none Does pain affect daily life?: no Are you currently prescribed opioids?: no  Dietary Habits and Nutritional Risks How many meals a day?: 3 Eats fruit and vegetables daily?: yes Most meals are obtained by: preparing own meals In the last 2 weeks, have you had any of the following?: none Diabetic:: no  Functional Status Activities of Daily Living (to include ambulation/medication): Independent Ambulation: Independent Medication Administration: Independent Home Management (perform basic housework or laundry): Independent Manage your own finances?: yes Primary transportation is: driving Concerns about hearing?: no  Fall Screening Falls in the past year?: 1 Number of falls in past year: 1 Was there an injury with Fall?: 1 Fall Risk Category  Calculator: 3 Patient Fall Risk Level: High Fall Risk  Fall Risk Patient at Risk for Falls Due to: Impaired balance/gait; Impaired mobility Fall risk Follow up: Falls evaluation completed; Education provided  Home and Transportation Safety: All rugs have non-skid backing?: yes All stairs or steps have railings?: yes Grab bars in the bathtub or shower?: yes Have non-skid surface in bathtub or shower?: yes Good home lighting?: yes Regular seat belt use?: yes Hospital stays in the last year:: no  Cognitive Assessment Difficulty concentrating, remembering, or making decisions? : no Will 6CIT or Mini Cog be Completed: yes What year is it?: 0 points What month is it?: 0 points Give patient an address phrase to remember (5 components): [address] [place], [place] About what time is it?: 0 points Count backwards from 20 to 1: 0 points Say the months of the year in reverse: 0 points Repeat the address phrase from earlier: 0 points 6 CIT Score: 0 points  Advance Directives (For Healthcare) Does Patient Have a Medical Advance Directive?: Yes Does patient want to make changes to medical advance directive?: No - Patient declined Type of Advance Directive: Healthcare Power of House; Living will Copy of Healthcare Power of Attorney in Chart?: Yes - validated most recent copy scanned in chart (See row information) Copy of Living Will in Chart?: Yes - validated most recent copy scanned in chart (See row information)  Reviewed/Updated  Reviewed/Updated: Reviewed All (Medical, Surgical, Family, Medications, Allergies, Care Teams, Patient Goals); Medical History; Surgical History; Family History; Medications; Allergies; Care Teams; Patient Goals    Allergies (verified) Other, Codeine, Hydrochlorothiazide , and Penicillins   Current Medications (verified) Outpatient Encounter Medications as of 01/07/2024  Medication Sig   acetaminophen  (TYLENOL ) 500 MG tablet Take by mouth.   atorvastatin   (LIPITOR) 40 MG tablet TAKE 1 TABLET ONCE DAILY.  Calcium  Carbonate-Vit D-Min (CALCIUM  1200 PO) Take 600 mg by mouth 2 (two) times daily.   Flaxseed, Linseed, (FLAX PO) Take 1 Dose by mouth every morning.    levocetirizine (XYZAL ) 5 MG tablet Take 0.5 tablets (2.5 mg total) by mouth every evening.   losartan  (COZAAR ) 100 MG tablet TAKE 1 TABLET BY MOUTH EVERY MORNING.   metoprolol  tartrate (LOPRESSOR ) 50 MG tablet take (1) tablet twice daily.   Multiple Vitamins-Minerals (CENTRUM SILVER) tablet Take 1 tablet by mouth daily.   OMEGA 3 1000 MG CAPS Take 1,000 mg by mouth at bedtime.    Probiotic Product (PROBIOTIC DAILY PO) Take 1 tablet by mouth daily.   Psyllium (METAMUCIL PO) Take 1 Scoop by mouth daily.   No facility-administered encounter medications on file as of 01/07/2024.    History: Past Medical History:  Diagnosis Date   Allergy    Arthritis    Basal cell carcinoma 04/29/2019   right inner cheek [facility])   Basal cell carcinoma 04/29/2019   left side nose [facility])   Cataract    Chronic kidney disease    Diverticulosis of colon    Essential hypertension    GERD (gastroesophageal reflux disease) 01/28/21   History of basal cell carcinoma (BCC) excision    02-10-2013  left cheek  s/p  moh's sx   History of palpitations    History of recurrent UTIs    History of syncope    2015-- felt to vasovagal response to pain (per cardiologist note)   Hyperlipidemia    IBS (irritable bowel syndrome)    Open wound of left knee    warm soaks daily /  neosprin and dressing daily / per per still has drainage   Osteoporosis    Patellar bursitis of left knee    pre-patella septic bursitis w/ foreign body   Right thyroid  nodule    x2 right side incidental finding on carotid duplex 03/ 2018-- thyroid  ultrasound done , benign , follow-up in one year   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/29/20201   in situ-right upper arm-anterior   Squamous cell carcinoma of skin 11/04/2019   [facility]-right lower  leg-anterior superior   Wears hearing aid in both ears    Past Surgical History:  Procedure Laterality Date   APPENDECTOMY  age 14   BREAST BIOPSY  1962   benign   CATARACT EXTRACTION W/PHACO  06/25/2011   Procedure: CATARACT EXTRACTION PHACO AND INTRAOCULAR LENS PLACEMENT (IOC);  Surgeon: Dow JULIANNA Burke, MD;  Location: [facility];  Service: Ophthalmology;  Laterality: Right;  CDE: 8.90   CATARACT EXTRACTION W/PHACO Left 05/05/2012   Procedure: CATARACT EXTRACTION PHACO AND INTRAOCULAR LENS PLACEMENT (IOC);  Surgeon: Dow JULIANNA Burke, MD;  Location: [facility];  Service: Ophthalmology;  Laterality: Left;  CDE 12.78   COLONOSCOPY  last one 11-26-2012   CYST EXCISION Left 04/16/2022   Procedure: LEFT RING FINGER EXCISION MUCOID CYST AND DISTAL INTERPHALANGEAL JOINT DEBRIDEMENT;  Surgeon: Murrell Drivers, MD;  Location: [facility];  Service: Orthopedics;  Laterality: Left;   EXCISION MORTON'S NEUROMA Right 2003  approx.   EYE SURGERY     INCISION AND DRAINAGE WOUND WITH FOREIGN BODY REMOVAL Left 08/30/2016   Procedure: LEFT KNEE INCISION AND DRAINAGE WOUND WITH FOREIGN BODY REMOVAL;  Surgeon: Gerome Charleston, MD;  Location: [facility];  Service: Orthopedics;  Laterality: Left;   JOINT REPLACEMENT  2019   KNEE ARTHROPLASTY Right 03/14/2017   Procedure: RIGHT TOTAL KNEE ARTHROPLASTY WITH COMPUTER  NAVIGATION AND REMOVAL OF HARDWARE;  Surgeon: Fidel Rogue, MD;  Location: [facility];  Service: Orthopedics;  Laterality: Right;  Adductor Block   KNEE ARTHROSCOPY W/ ACL RECONSTRUCTION Right 1996   KNEE CARTILAGE SURGERY Right age 82   LUMBAR FUSION  08/28/2020   MOHS SURGERY  02/10/2013   left cheek -- [facility]   POSTERIOR REPAIR  05-21-2002   dr edsel   symptomatic recetocele   SPINE SURGERY  2022   TONSILLECTOMY AND ADENOIDECTOMY  age 7   TRANSTHORACIC ECHOCARDIOGRAM  04-17-2016  dr debera   ef 60-65%/  trivial MR/ mild TR   VAGINAL HYSTERECTOMY  1979   w/  Bilateral  Salpingoophorectomy   Family History  Problem Relation Age of Onset   Hypertension Mother    CVA Mother    Arthritis Mother    Stroke Mother    Vision loss Mother    Throat cancer Father    Alcohol  abuse Father    Cancer Father    Heart attack Maternal Grandfather    Drug abuse Brother    Early death Brother    Pseudochol deficiency Neg Hx    Malignant hyperthermia Neg Hx    Hypotension Neg Hx    Anesthesia problems Neg Hx    Colon cancer Neg Hx    Esophageal cancer Neg Hx    Social History   Occupational History   Occupation: retired  Tobacco Use   Smoking status: Former    Current packs/day: 0.00    Average packs/day: 0.5 packs/day for 10.0 years (5.0 ttl pk-yrs)    Types: Cigarettes    Start date: 04/29/1952    Quit date: 04/30/1962    Years since quitting: 61.7   Smokeless tobacco: Never  Vaping Use   Vaping status: Never Used  Substance and Sexual Activity   Alcohol  use: Yes    Alcohol /week: 14.0 standard drinks of alcohol     Types: 14 Glasses of wine per week    Comment: 2 wine daily   Drug use: No   Sexual activity: Yes    Birth control/protection: Surgical   Tobacco Counseling Counseling given: Yes  SDOH Screenings   Food Insecurity: No Food Insecurity (01/07/2024)  Housing: Low Risk  (01/07/2024)  Transportation Needs: No Transportation Needs (01/07/2024)  Utilities: Not At Risk (01/07/2024)  Alcohol  Screen: Low Risk  (12/25/2023)  Depression (PHQ2-9): Low Risk  (01/07/2024)  Financial Resource Strain: Low Risk  (12/25/2023)  Physical Activity: Sufficiently Active (01/07/2024)  Social Connections: Moderately Isolated (01/07/2024)  Stress: No Stress Concern Present (01/07/2024)  Tobacco Use: Medium Risk (01/07/2024)  Health Literacy: Adequate Health Literacy (01/07/2024)   See flowsheets for full screening details  Depression Screen PHQ 2 & 9 Depression Scale- Over the past 2 weeks, how often have you been bothered by any of the following  problems? Little interest or pleasure in doing things: 0 Feeling down, depressed, or hopeless (PHQ Adolescent also includes...irritable): 1 PHQ-2 Total Score: 1 Trouble falling or staying asleep, or sleeping too much: 0 Feeling tired or having little energy: 0 Poor appetite or overeating (PHQ Adolescent also includes...weight loss): 0 Feeling bad about yourself - or that you are a failure or have let yourself or your family down: 0 Trouble concentrating on things, such as reading the newspaper or watching television (PHQ Adolescent also includes...like school work): 0 Moving or speaking so slowly that other people could have noticed. Or the opposite - being so fidgety or restless that you have been moving around a  lot more than usual: 0 Thoughts that you would be better off dead, or of hurting yourself in some way: 0 PHQ-9 Total Score: 1 If you checked off any problems, how difficult have these problems made it for you to do your work, take care of things at home, or get along with other people?: Not difficult at all  Depression Treatment Depression Interventions/Treatment : EYV7-0 Score <4 Follow-up Not Indicated     Goals Addressed             This Visit's Progress    Prevent Falls and Injury               Objective:    Today's Vitals   01/07/24 1449  BP: 135/76  Pulse: 66  Weight: 115 lb (52.2 kg)  Height: 5' 8 (1.727 m)   Body mass index is 17.49 kg/m.  Hearing/Vision screen Hearing Screening - Comments:: Pt wear hearing aids Vision Screening - Comments:: Pt denies vision dif/pt goes Dr. Octavia in [place], [place]/last ov 2025 Immunizations and Health Maintenance Health Maintenance  Topic Date Due   COVID-19 Vaccine (7 - Moderna risk 2025-26 season) 01/10/2025 (Originally 05/20/2024)   Medicare Annual Wellness (AWV)  01/06/2025   DTaP/Tdap/Td (4 - Td or Tdap) 11/17/2033   Pneumococcal Vaccine: 50+ Years  Completed   Influenza Vaccine  Completed   Bone Density  Scan  Completed   Meningococcal B Vaccine  Aged Out   Zoster Vaccines- Shingrix  Discontinued        Assessment/Plan:  This is a routine wellness examination for Jaeleah.  Patient Care Team: Joesph Annabella HERO, FNP as PCP - General (Family Medicine) Debera Jayson MATSU, MD as PCP - Cardiology (Cardiology) Livingston Rigg, MD as Consulting Physician (Dermatology) Miriam Norris, NP as Nurse Practitioner (Cardiology) Dina Camie FORBES DEVONNA (Neurology)  I have personally reviewed and noted the following in the patient's chart:   Medical and social history Use of alcohol , tobacco or illicit drugs  Current medications and supplements including opioid prescriptions. Functional ability and status Nutritional status Physical activity Advanced directives List of other physicians Hospitalizations, surgeries, and ER visits in previous 12 months Vitals Screenings to include cognitive, depression, and falls Referrals and appointments  No orders of the defined types were placed in this encounter.  In addition, I have reviewed and discussed with patient certain preventive protocols, quality metrics, and best practice recommendations. A written personalized care plan for preventive services as well as general preventive health recommendations were provided to patient.   Ozie Ned, CMA   01/07/2024   Return in 1 year (on 01/06/2025).  After Visit Summary: (MyChart) Due to this being a telephonic visit, the after visit summary with patients personalized plan was offered to patient via MyChart   Nurse Notes: N/A

## 2024-01-08 ENCOUNTER — Telehealth: Payer: Self-pay

## 2024-01-08 NOTE — Progress Notes (Signed)
 Care Guide Pharmacy Note  01/08/2024 Name: [name] MRN: [id number] DOB: 1940-11-10  Referred By: Joesph Annabella HERO, FNP Reason for referral: Complex Care Management (Outreach to schedule with Pharm d )   [name] is a 83 y.o. year old female who is a primary care patient of Joesph Annabella HERO, FNP.  [name] was referred to the pharmacist for assistance related to: osteoporosis  Successful contact was made with the patient to discuss pharmacy services including being ready for the pharmacist to call at least 5 minutes before the scheduled appointment time and to have medication bottles and any blood pressure readings ready for review. The patient agreed to meet with the pharmacist via telephone visit on (date/time).02/13/2023  Jeoffrey Buffalo , RMA     [place]  [facility], [facility] Guide  Direct Dial: [phone]  Website: [email or web address]

## 2024-01-08 NOTE — Progress Notes (Signed)
 Care Guide Pharmacy Note  01/08/2024 Name: [name] MRN: [id number] DOB: 1940/02/07  Referred By: Joesph Annabella HERO, FNP Reason for referral: Complex Care Management (Outreach to schedule with Pharm d )   [name] is a 83 y.o. year old female who is a primary care patient of Joesph Annabella HERO, FNP.  [name] was referred to the pharmacist for assistance related to: osteoporosis  An unsuccessful telephone outreach was attempted today to contact the patient who was referred to the pharmacy team for assistance with medication management. Additional attempts will be made to contact the patient.  Jeoffrey Buffalo , RMA     [facility]  [facility], [facility] Guide  Direct Dial: [phone]  Website: [email or web address]

## 2024-01-09 ENCOUNTER — Telehealth: Payer: Self-pay | Admitting: Family Medicine

## 2024-01-09 ENCOUNTER — Other Ambulatory Visit: Payer: Self-pay

## 2024-01-09 DIAGNOSIS — I10 Essential (primary) hypertension: Secondary | ICD-10-CM

## 2024-01-09 DIAGNOSIS — I499 Cardiac arrhythmia, unspecified: Secondary | ICD-10-CM

## 2024-01-09 MED ORDER — METOPROLOL TARTRATE 50 MG PO TABS: ORAL_TABLET | ORAL | 0 refills | Status: AC

## 2024-01-09 MED ORDER — LOSARTAN POTASSIUM 100 MG PO TABS: 100.0000 mg | ORAL_TABLET | Freq: Every morning | ORAL | 1 refills | Status: AC

## 2024-01-09 MED ORDER — ATORVASTATIN CALCIUM 40 MG PO TABS: 40.0000 mg | ORAL_TABLET | Freq: Every day | ORAL | 1 refills | Status: AC

## 2024-01-09 NOTE — Telephone Encounter (Signed)
 I spoke to pt and she states she just had her meds refilled and still has a refill on them so doesn't need anything at this moment.

## 2024-01-09 NOTE — Telephone Encounter (Signed)
 Copied from CRM [id number]. Topic: Clinical - Medication Question >> Jan 08, 2024  3:39 PM [name] wrote: Reason for CRM: Patient called.. wants to know if her medication can be renewed and changed to be under [name] to be notified to [facility].  Pls call patient to review

## 2024-01-09 NOTE — Telephone Encounter (Signed)
 Refilled and pt notified. LS

## 2024-01-09 NOTE — Telephone Encounter (Signed)
  Prescription Request  01/09/2024  Is this a Controlled Substance medicine? No  Have you seen your PCP in the last 2 weeks? Yes, pt was seen 12/26/2023 she did not ask for refill since she had refills on previous prescriptions but she was told at pharmacy that she needs new rx from new pcp  If YES, route message to pool  -  If NO, patient needs to be scheduled for appointment.  What is the name of the medication or equipment? losartan  potassium100 MG tablet  Atorvastatin  40mg  tablet metoprolol  tartrate (LOPRESSOR ) 50 MG tablet    Have you contacted your pharmacy to request a refill? yes   Which pharmacy would you like this sent to? [facility]    Patient notified that their request is being sent to the clinical staff for review and that they should receive a response within 2 business days.

## 2024-01-18 ENCOUNTER — Other Ambulatory Visit: Payer: Self-pay | Admitting: Family Medicine

## 2024-01-18 DIAGNOSIS — J069 Acute upper respiratory infection, unspecified: Secondary | ICD-10-CM

## 2024-02-03 NOTE — Progress Notes (Signed)
 "   Mild Cognitive Impairment    [name] is a very pleasant 83 y.o. 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 Prior neurological and neuropsychological workup was negative. She is not on antidementia medication. She was last seen on 01/10/23.  Memory is ***. MMSE today is  /30. Patient is able to participate on ADLs and to drive without difficulties. Mood is ***. This patient is accompanied in the office by her husband***  who supplements the history. Previous records as well as any outside records available were reviewed prior to todays visit   No antidementia medication indicated at this time Continue to control mood as per PCP Recommend good control of cardiovascular risk factors and of hyponatremia Folllow up ***     Discussed the use of AI scribe software for clinical note transcription with the patient, who gave verbal consent to proceed.  History of Present Illness   Any changes in memory since last visit? It is better, husband agrees, names . No more foggy brain she says. Patient denies any difficulty remembering recent conversations or names  repeats oneself?  Endorsed, occasionally Disoriented when walking into a room?  Patient denies  Leaving objects in unusual places?  Patient denies   Wandering behavior?   denies   Any personality changes since last visit? Denies.   Any worsening depression?:  Lost her beloved cat which has brought sadness to her, she is experiencing situational depression.   Hallucinations or paranoia?  Denies.   Seizures?   denies    Any sleep changes?  Sleeps well . Denies  vivid dreams, REM behavior or sleepwalking   Sleep apnea?   denies    Any hygiene concerns?   denies   Independent of bathing and dressing?  Endorsed  Does the patient needs help with medications? Patient is in charge   Who is in charge of the finances?  Patient is in charge     Any changes in appetite?  Denies.     Patient have trouble swallowing?  denies .  Does the patient cook? Yes , denies forgetting common recipes . Any kitchen accidents such as leaving the stove on?   denies   Any headaches?    Denies.   Vision changes? Denies. Chronic back pain  denies.   Ambulates with difficulty?    Denies.  Continues to do Yoga which has improved balance   Recent falls or head injuries? denies     Unilateral weakness, numbness or tingling? Denies.   Any tremors?  Denies.   Any anosmia?    Denies.   Any incontinence of urine? Denies    Any bowel dysfunction?  denies      Patient lives with husband  Does the patient drive?yes, denies getting lost.       History of Present Illness 10/29/22     [name] is a very pleasant 83 y.o. LH female  with a history of thyroid  nodules, hyperlipidemia, hard of hearing with hearing aids, white coat hypertension, recurrent UTIs, IBS, and recent presentation to the ED on 06/29/2022 with AMS, 3 weeks after a concussion after mechanical fall with a whiplash and imbalance, nausea and vomiting.  At the time, she had been seen  at the ED, with workup remarkable for hyponatremia (126), and a negative CT scan of the head.  The rest of the workup was negative. She was seen again on 07/30/2022  due to persistent brain fog .  At the ED, her workup was again negative, without neurological findings.  She was then referred to us  for further evaluation of transient alteration of awareness, versus postconcussion syndrome.  Her MoCA at the time (08/08/2022) was 22/30.  At the time of evaluation, her symptoms had improved and was able to participate on her ADLs without difficulty.  EEG was normal, without any seizure activity.  Labs were normal.   MRI of the brain, personally reviewed was remarkable for mild for age generalized cerebral atrophy, and mild chronic small vessel ischemic changes within the cerebral white matter, without evidence of acute intracranial abnormality.  There was some C3-C4 grade 1 anterolisthesis, which is being followed as an outpatient. In today's visit, the patient reports some episodes of brain fog, but she attributes this to low sodium. When I take some electrolytes it improves. Patient is able to participate on his ADLs  without difficulties Discussed with patient repeating the EEG (24 h) for  further evaluation of these episodes. If negative, may consider neuropsych evaluation to rule out a cognitive issue. She is going out of town, will proceed with all these studies after her return. Recommend no driving till her EEG is normal.   R or L handed? L handed  Initial symptoms  I had to urinate at 5 am and was trying to run to the bathroom and hit the dresser, hitting the frontoparietal area and felt a pop in the neck, like whip splash. She also had a partial tear of the R rotator cuff   Witnessed? No Tremor:  No  Voice: After the fall she feels she had hoarseness Hallucinations: No              visual distortions No             Auditory hallucinations? No   Taste of blood or metallic taste? No Nausea and Vomiting? After the fall , only one time Lightheadedness/ Dizziness? Not really. Just felt not right             Syncope? No    Diplopia or other visual changes? No  History of encephalitis or meningitis? No  History of Headaches? Only when I was menstruating Loss of smell:  No Loss of taste :  No  Urinary or Bowel Incontinence :Denies Difficulty Swallowing ?No  Trouble with ADL's?I have arthritis otherwise, no             Trouble buttoning clothing? No   Mood Changes? Depression, more situational  Memory changes: during that time big time-husband says . She was foggy brained during  that time, she was more forgetful but now is way improved. During the time following the episode she retained her personal identity Sleep:  Sleeps well. How many hours? 8               Rested upon waking up? Yes, lately. ETOH? 2 glasses at night  Tobacco? No  Recreational Drugs? No   Caffeine? No  Active: Does yoga, gardening, etc, is very active         Past Medical History:  Diagnosis Date   Allergy    Arthritis    Basal cell carcinoma 04/29/2019   right inner cheek [facility])   Basal cell carcinoma 04/29/2019  left side nose (MOHs)   Cataract    Chronic kidney disease    Diverticulosis of colon    Essential hypertension    GERD (gastroesophageal reflux disease) 01/28/21   History of basal cell carcinoma (BCC) excision    02-10-2013  left cheek  s/p  moh's sx   History of palpitations    History of recurrent UTIs    History of syncope    2015-- felt to vasovagal response to pain (per cardiologist note)   Hyperlipidemia    IBS (irritable bowel syndrome)    Open wound of left knee    warm soaks daily /  neosprin and dressing daily / per per still has drainage   Osteoporosis    Patellar bursitis of left knee    pre-patella septic bursitis w/ foreign body   Right thyroid  nodule    x2 right side incidental finding on carotid duplex 03/ 2018-- thyroid  ultrasound done , benign , follow-up in one year   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/29/20201   in situ-right upper arm-anterior   Squamous cell carcinoma of skin 11/04/2019   KA-right lower leg-anterior superior   Wears hearing aid in both ears      Past Surgical History:  Procedure Laterality Date   APPENDECTOMY  age 43   BREAST BIOPSY  1962   benign   CATARACT EXTRACTION W/PHACO  06/25/2011   Procedure: CATARACT EXTRACTION PHACO AND INTRAOCULAR LENS PLACEMENT (IOC);  Surgeon: Dow JULIANNA Burke, MD;  Location: [facility];  Service: Ophthalmology;  Laterality: Right;  CDE: 8.90   CATARACT EXTRACTION W/PHACO Left 05/05/2012    Procedure: CATARACT EXTRACTION PHACO AND INTRAOCULAR LENS PLACEMENT (IOC);  Surgeon: Dow JULIANNA Burke, MD;  Location: [facility];  Service: Ophthalmology;  Laterality: Left;  CDE 12.78   COLONOSCOPY  last one 11-26-2012   CYST EXCISION Left 04/16/2022   Procedure: LEFT RING FINGER EXCISION MUCOID CYST AND DISTAL INTERPHALANGEAL JOINT DEBRIDEMENT;  Surgeon: Murrell Drivers, MD;  Location: [facility];  Service: Orthopedics;  Laterality: Left;   EXCISION MORTON'S NEUROMA Right 2003  approx.   EYE SURGERY     INCISION AND DRAINAGE WOUND WITH FOREIGN BODY REMOVAL Left 08/30/2016   Procedure: LEFT KNEE INCISION AND DRAINAGE WOUND WITH FOREIGN BODY REMOVAL;  Surgeon: Gerome Charleston, MD;  Location: [facility] [place];  Service: Orthopedics;  Laterality: Left;   JOINT REPLACEMENT  2019   KNEE ARTHROPLASTY Right 03/14/2017   Procedure: RIGHT TOTAL KNEE ARTHROPLASTY WITH COMPUTER NAVIGATION AND REMOVAL OF HARDWARE;  Surgeon: Fidel Rogue, MD;  Location: [facility];  Service: Orthopedics;  Laterality: Right;  Adductor Block   KNEE ARTHROSCOPY W/ ACL RECONSTRUCTION Right 1996   KNEE CARTILAGE SURGERY Right age 39   LUMBAR FUSION  08/28/2020   MOHS SURGERY  02/10/2013   left cheek -- [facility]   POSTERIOR REPAIR  05-21-2002   dr edsel   symptomatic recetocele   SPINE SURGERY  2022   TONSILLECTOMY AND ADENOIDECTOMY  age 34   TRANSTHORACIC ECHOCARDIOGRAM  04-17-2016  dr debera   ef 60-65%/  trivial MR/ mild TR   VAGINAL HYSTERECTOMY  1979   w/  Bilateral Salpingoophorectomy         Objective:     PHYSICAL EXAMINATION:    VITALS:  There were no vitals filed for this visit.  GEN:  The patient appears stated age and is in NAD. HEENT:  Normocephalic, atraumatic.   Neurological examination:  General: NAD, well-groomed, appears stated age. Orientation:  The patient is alert. Oriented to person, place and not to date.*** Cranial nerves: There is good facial symmetry.The speech is  fluent and clear. No aphasia or dysarthria. Fund of knowledge is appropriate. Recent memory impaired and remote memory is normal.  Attention and concentration are normal.  Able to name objects and repeat phrases.  Hearing is intact to conversational tone ***.   Delayed recall *** Sensation: Sensation is intact to light touch throughout Motor: Strength is at least antigravity x4. DTR's 2/4 in UE/LE      08/08/2022    5:00 PM  Montreal Cognitive Assessment   Visuospatial/ Executive (0/5) 2  Naming (0/3) 3  Attention: Read list of digits (0/2) 2  Attention: Read list of letters (0/1) 1  Attention: Serial 7 subtraction starting at 100 (0/3) 3  Language: Repeat phrase (0/2) 2  Language : Fluency (0/1) 1  Abstraction (0/2) 0  Delayed Recall (0/5) 2  Orientation (0/6) 6  Total 22  Adjusted Score (based on education) 22       01/10/2023    5:00 PM  MMSE - Mini Mental State Exam  Orientation to time 5  Orientation to Place 5  Registration 3  Attention/ Calculation 5  Recall 3  Language- name 2 objects 2  Language- repeat 1  Language- follow 3 step command 3  Language- read & follow direction 1  Write a sentence 1  Copy design 1  Total score 30      Movement examination: Tone: There is normal tone in the UE/LE Abnormal movements:  no tremor.  No myoclonus.  No asterixis.   Coordination:  There is no decremation with RAM's. Normal finger to nose  Gait and Station: The patient has no difficulty arising out of a deep-seated chair without the use of the hands. The patient's stride length is good.  Gait is cautious and narrow.   Thank you for allowing us  the opportunity to participate in the care of this nice patient. Please do not hesitate to contact us  for any questions or concerns.   Total time spent on today's visit was *** minutes dedicated to this patient today, preparing to see patient, examining the patient, ordering tests and/or medications and counseling the patient,  documenting clinical information in the EHR or other health record, independently interpreting results and communicating results to the patient/family, discussing treatment and goals, answering patient's questions and coordinating care.  Cc:  Joesph Annabella HERO, FNP  Camie Sevin 02/03/2024 5:40 PM      "

## 2024-02-04 ENCOUNTER — Ambulatory Visit: Admitting: Physician Assistant

## 2024-02-04 ENCOUNTER — Encounter: Payer: Self-pay | Admitting: Physician Assistant

## 2024-02-04 ENCOUNTER — Other Ambulatory Visit

## 2024-02-04 DIAGNOSIS — E871 Hypo-osmolality and hyponatremia: Secondary | ICD-10-CM | POA: Diagnosis not present

## 2024-02-04 DIAGNOSIS — R413 Other amnesia: Secondary | ICD-10-CM

## 2024-02-04 LAB — SODIUM: Sodium: 137 mmol/L (ref 135–146)

## 2024-02-04 NOTE — Patient Instructions (Addendum)
 It was a pleasure to see you today at our office.   Recommendations:  Neurocognitive evaluation at our office   Check labs today  [address] Follow up in  months  Alcohol  reduction Recommend using the hearing aids in an effort to improve comprehension   [email or web address]/[email or web address]-[email or web address]-apps-[email or web address]-[email or web address]   RECOMMENDATIONS FOR ALL PATIENTS WITH MEMORY PROBLEMS: 1. Continue to exercise (Recommend 30 minutes of walking everyday, or 3 hours every week) 2. Increase social interactions - continue going to [place] and enjoy social gatherings with friends and family 3. Eat healthy, avoid fried foods and eat more fruits and vegetables 4. Maintain adequate blood pressure, blood sugar, and blood cholesterol level. Reducing the risk of stroke and cardiovascular disease also helps promoting better memory. 5. Avoid stressful situations. Live a simple life and avoid aggravations. Organize your time and prepare for the next day in anticipation. 6. Sleep well, avoid any interruptions of sleep and avoid any distractions in the bedroom that may interfere with adequate sleep quality 7. Avoid sugar, avoid sweets as there is a strong link between excessive sugar intake, diabetes, and cognitive impairment We discussed the Mediterranean diet, which has been shown to help patients reduce the risk of progressive memory disorders and reduces cardiovascular risk. This includes eating fish, eat fruits and green leafy vegetables, nuts like almonds and hazelnuts, walnuts, and also use olive oil. Avoid fast foods and fried foods as much as possible. Avoid sweets and sugar as sugar use has been linked to worsening of memory function.  There is always a concern of gradual progression of memory problems. If this is the case, then we may need to adjust level of care according to patient needs. Support, both to the patient and caregiver, should then be put into place.      You have been referred for a  neuropsychological evaluation (i.e., evaluation of memory and thinking abilities). Please bring someone with you to this appointment if possible, as it is helpful for the doctor to hear from both you and another adult who knows you well. Please bring eyeglasses and hearing aids if you wear them.    The evaluation will take approximately 3 hours and has two parts:   The first part is a clinical interview with the neuropsychologist (Dr. Richie or Dr. Gayland). During the interview, the neuropsychologist will speak with you and the individual you brought to the appointment.    The second part of the evaluation is testing with the doctor's technician Neal or Luke). During the testing, the technician will ask you to remember different types of material, solve problems, and answer some questionnaires. Your family member will not be present for this portion of the evaluation.   Please note: We must reserve several hours of the neuropsychologist's time and the psychometrician's time for your evaluation appointment. As such, there is a No-Show fee of $100. If you are unable to attend any of your appointments, please contact our office as soon as possible to reschedule.      DRIVING: Regarding driving, in patients with progressive memory problems, driving will be impaired. We advise to have someone else do the driving if trouble finding directions or if minor accidents are reported. Independent driving assessment is available to determine safety of driving.   If you are interested in the driving assessment, you can contact the following:  [employer] in [place] [phone]  [facility] [phone]  [facility] [phone]  [facility] [phone] or [phone]   FALL  PRECAUTIONS: Be cautious when walking. Scan the area for obstacles that may increase the risk of trips and falls. When getting up in the mornings, sit up at the edge of the bed for a  few minutes before getting out of bed. Consider elevating the bed at the head end to avoid drop of blood pressure when getting up. Walk always in a well-lit room (use night lights in the walls). Avoid area rugs or power cords from appliances in the middle of the walkways. Use a walker or a cane if necessary and consider physical therapy for balance exercise. Get your eyesight checked regularly.  FINANCIAL OVERSIGHT: Supervision, especially oversight when making financial decisions or transactions is also recommended.  HOME SAFETY: Consider the safety of the kitchen when operating appliances like stoves, microwave oven, and blender. Consider having supervision and share cooking responsibilities until no longer able to participate in those. Accidents with firearms and other hazards in the house should be identified and addressed as well.   ABILITY TO BE LEFT ALONE: If patient is unable to contact 911 operator, consider using LifeLine, or when the need is there, arrange for someone to stay with patients. Smoking is a fire hazard, consider supervision or cessation. Risk of wandering should be assessed by caregiver and if detected at any point, supervision and safe proof recommendations should be instituted.  MEDICATION SUPERVISION: Inability to self-administer medication needs to be constantly addressed. Implement a mechanism to ensure safe administration of the medications.      Mediterranean Diet A Mediterranean diet refers to food and lifestyle choices that are based on the traditions of countries located on the [employer] [place]. This way of eating has been shown to help prevent certain conditions and improve outcomes for people who have chronic diseases, like kidney disease and heart disease. What are tips for following this plan? Lifestyle  Cook and eat meals together with your family, when possible. Drink enough fluid to keep your urine clear or pale yellow. Be physically active every day.  This includes: Aerobic exercise like running or swimming. Leisure activities like gardening, walking, or housework. Get 7-8 hours of sleep each night. If recommended by your health care provider, drink red wine in moderation. This means 1 glass a day for nonpregnant women and 2 glasses a day for men. A glass of wine equals 5 oz (150 mL). Reading food labels  Check the serving size of packaged foods. For foods such as rice and pasta, the serving size refers to the amount of cooked product, not dry. Check the total fat in packaged foods. Avoid foods that have saturated fat or trans fats. Check the ingredients list for added sugars, such as corn syrup. Shopping  At the grocery store, buy most of your food from the areas near the walls of the store. This includes: Fresh fruits and vegetables (produce). Grains, beans, nuts, and seeds. Some of these may be available in unpackaged forms or large amounts (in bulk). Fresh seafood. Poultry and eggs. Low-fat dairy products. Buy whole ingredients instead of prepackaged foods. Buy fresh fruits and vegetables in-season from local farmers markets. Buy frozen fruits and vegetables in resealable bags. If you do not have access to quality fresh seafood, buy precooked frozen shrimp or canned fish, such as tuna, salmon, or sardines. Buy small amounts of raw or cooked vegetables, salads, or olives from the deli or salad bar at your store. Stock your pantry so you always have certain foods on hand, such as olive oil, canned  tuna, canned tomatoes, rice, pasta, and beans. Cooking  Cook foods with extra-virgin olive oil instead of using butter or other vegetable oils. Have meat as a side dish, and have vegetables or grains as your main dish. This means having meat in small portions or adding small amounts of meat to foods like pasta or stew. Use beans or vegetables instead of meat in common dishes like chili or lasagna. Experiment with different cooking methods.  Try roasting or broiling vegetables instead of steaming or sauteing them. Add frozen vegetables to soups, stews, pasta, or rice. Add nuts or seeds for added healthy fat at each meal. You can add these to yogurt, salads, or vegetable dishes. Marinate fish or vegetables using olive oil, lemon juice, garlic, and fresh herbs. Meal planning  Plan to eat 1 vegetarian meal one day each week. Try to work up to 2 vegetarian meals, if possible. Eat seafood 2 or more times a week. Have healthy snacks readily available, such as: Vegetable sticks with hummus. Greek yogurt. Fruit and nut trail mix. Eat balanced meals throughout the week. This includes: Fruit: 2-3 servings a day Vegetables: 4-5 servings a day Low-fat dairy: 2 servings a day Fish, poultry, or lean meat: 1 serving a day Beans and legumes: 2 or more servings a week Nuts and seeds: 1-2 servings a day Whole grains: 6-8 servings a day Extra-virgin olive oil: 3-4 servings a day Limit red meat and sweets to only a few servings a month What are my food choices? Mediterranean diet Recommended Grains: Whole-grain pasta. Brown rice. Bulgar wheat. Polenta. Couscous. Whole-wheat bread. [place] Made[place]. Vegetables: Artichokes. Beets. Broccoli. Cabbage. Carrots. Eggplant. Green beans. Chard. Kale. Spinach. Onions. Leeks. Peas. Squash. Tomatoes. Peppers. Radishes. Fruits: Apples. Apricots. Avocado. Berries. Bananas. Cherries. Dates. Figs. Grapes. Lemons. Melon. Oranges. Peaches. Plums. Pomegranate. Meats and other protein foods: Beans. Almonds. Sunflower seeds. Pine nuts. Peanuts. Cod. Salmon. Scallops. Shrimp. Tuna. Tilapia. Clams. Oysters. Eggs. Dairy: Low-fat milk. Cheese. Greek yogurt. Beverages: Water . Red wine. Herbal tea. Fats and oils: Extra virgin olive oil. Avocado oil. Grape seed oil. Sweets and desserts: Greek yogurt with honey. Baked apples. Poached pears. Trail mix. Seasoning and other foods: Basil. Cilantro. Coriander. Cumin. Mint.  Parsley. Sage. Rosemary. Tarragon. Garlic. Oregano. Thyme. Pepper. Balsalmic vinegar. Tahini. Hummus. Tomato sauce. Olives. Mushrooms. Limit these Grains: Prepackaged pasta or rice dishes. Prepackaged cereal with added sugar. Vegetables: Deep fried potatoes (french fries). Fruits: Fruit canned in syrup. Meats and other protein foods: Beef. Pork. Lamb. Poultry with skin. Hot dogs. [place]. Dairy: Ice cream. Sour cream. Whole milk. Beverages: Juice. Sugar-sweetened soft drinks. Beer. Liquor and spirits. Fats and oils: Butter. Canola oil. Vegetable oil. Beef fat (tallow). Lard. Sweets and desserts: Cookies. Cakes. Pies. Candy. Seasoning and other foods: Mayonnaise. Premade sauces and marinades. The items listed may not be a complete list. Talk with your dietitian about what dietary choices are right for you. Summary The Mediterranean diet includes both food and lifestyle choices. Eat a variety of fresh fruits and vegetables, beans, nuts, seeds, and whole grains. Limit the amount of red meat and sweets that you eat. Talk with your health care provider about whether it is safe for you to drink red wine in moderation. This means 1 glass a day for nonpregnant women and 2 glasses a day for men. A glass of wine equals 5 oz (150 mL). This information is not intended to replace advice given to you by your health care provider. Make sure you discuss any questions you have  with your health care provider. Document Released: 09/15/2015 Document Revised: 10/18/2015 Document Reviewed: 09/15/2015 Elsevier Interactive Patient Education  2017 [employer].

## 2024-02-05 ENCOUNTER — Ambulatory Visit: Payer: Self-pay | Admitting: Physician Assistant

## 2024-02-05 DIAGNOSIS — R413 Other amnesia: Secondary | ICD-10-CM | POA: Insufficient documentation

## 2024-02-06 LAB — VITAMIN B12: Vitamin B-12: 1166 pg/mL — ABNORMAL HIGH (ref 200–1100)

## 2024-02-06 LAB — VITAMIN B1: Vitamin B1 (Thiamine): 22 nmol/L (ref 8–30)

## 2024-02-06 LAB — TSH: TSH: 2.64 m[IU]/L (ref 0.40–4.50)

## 2024-02-10 ENCOUNTER — Institutional Professional Consult (permissible substitution): Admitting: Psychology

## 2024-02-10 ENCOUNTER — Ambulatory Visit

## 2024-02-10 DIAGNOSIS — F0670 Mild neurocognitive disorder due to known physiological condition without behavioral disturbance: Secondary | ICD-10-CM

## 2024-02-10 DIAGNOSIS — R4189 Other symptoms and signs involving cognitive functions and awareness: Secondary | ICD-10-CM

## 2024-02-10 NOTE — Progress Notes (Signed)
" ° °  Psychometrician Note   Cognitive testing was administered to Arriyah G Oppenheimer by Lonell Jude, B.S. (psychometrist) under the supervision of Dr. Renda Beckwith, Psy.D., licensed psychologist on 02/10/2024. Ms. Reitan did not appear overtly distressed by the testing session per behavioral observation or responses across self-report questionnaires. Rest breaks were offered.   The battery of tests administered was selected by Dr. Renda Beckwith, Psy.D. with consideration to Ms. Foor's current level of functioning, the nature of her symptoms, emotional and behavioral responses during interview, level of literacy, observed level of motivation/effort, and the nature of the referral question. This battery was communicated to the psychometrist. Communication between Dr. Renda Beckwith, Psy.D. and the psychometrist was ongoing throughout the evaluation and Dr. Renda Beckwith, Psy.D. was immediately accessible at all times. Dr. Renda Beckwith, Psy.D. provided supervision to the psychometrist on the date of this service to the extent necessary to assure the quality of all services provided.    Breuna G Heavrin will return within approximately 1-2 weeks for an interactive feedback session with Dr. Beckwith at which time her test performances, clinical impressions, and treatment recommendations will be reviewed in detail. Ms. Tolosa understands she can contact our office should she require our assistance before this time.  A total of 105 minutes of billable time were spent face-to-face with Ms. Salamone by the psychometrist. This includes both test administration and scoring time. Billing for these services is reflected in the clinical report generated by Dr. Renda Beckwith, Psy.D.  This note reflects time spent with the psychometrician and does not include test scores or any clinical interpretations made by Dr. Beckwith. The full report will follow in a separate note. "

## 2024-02-10 NOTE — Progress Notes (Signed)
 "  NEUROPSYCHOLOGICAL EVALUATION [place]. [facility]  [place] Department of Neurology  Date of Evaluation: 02/10/2024  REASON FOR REFERRAL   [name] is an 84 year old, left-handed, White female with 14 years of formal education. She was referred for neuropsychological evaluation by Camie Sevin, PA-C, to assess current neurocognitive functioning, document potential cognitive deficits, and assist with treatment planning. This is her first neuropsychological evaluation.  SUMMARY OF RESULTS   Premorbid cognitive abilities are estimated to be in the above average range based on word reading and sociodemographic factors. Relative to this baseline estimate, current performance was largely within age-related expectations, apart from verbal fluency and aspects of memory. Memory difficulties were primarily retrieval based, as observed in the recall of a word list, short stories, and shapes. Immediate recall and recognition were otherwise generally adequate. The only exception was weak recognition of the word list, which may have been influenced by the amount of material initially encoded.  Performance across remaining cognitive domains, including working memory, processing speed, executive functioning, and visuospatial abilities, was intact and frequently exceeded age-based expectations.  On self-report questionnaires, she did not endorse significant symptoms of depression or anxiety.  DIAGNOSTIC IMPRESSION   Results of the current evaluation indicated primary deficits in verbal fluency and memory retrieval (with otherwise generally intact immediate recall and recognition). 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   Although her hearing loss is significant and likely contributes to cognitive difficulties in daily life, the pattern of cognitive weaknesses observed on evaluation is  unlikely to be fully explained by hearing loss alone. There may be some contribution from white matter changes; however, neuroimaging revealed only mild pathology at this time. Additional contributing factors may include low mood, difficulty adjusting to recent health changes, and reduced cognitive and social engagement.   While the current cognitive profile is not strongly suggestive of a classic Alzheimers disease pattern, the presence of memory weaknesses remains a concern. Ongoing monitoring is therefore recommended to evaluate for potential progression and to rule out the very early stages of a neurodegenerative process.  ICD-10 Codes: F06.70 Mild neurocognitive disorder (mild cognitive impairment)  RECOMMENDATIONS   A repeat neuropsychological evaluation in 18-24 months (or sooner if functional decline is noted) is recommended.  Discuss with your neurologist the risks and benefits of starting a medication that can help slow memory decline.  Patient is not currently engaged in treatment for depression but has expressed interest in exploring both medication and counseling options. She is strongly encouraged to pursue these options, particularly given that emotional distress can exacerbate cognitive difficulties. She may consider discussing potential medication options with a prescribing provider to determine whether pharmacologic treatment would be beneficial. In addition, counseling or psychotherapy may provide valuable support in managing depressive symptoms. To locate a counselor who accepts her insurance, she may contact her insurance company directly--either through the customer service number on her insurance card or via the insurers website--to access an publishing rights manager. Online resources such as Psychology Today also teacher, english as a foreign language that allow filtering by location, specialty, and accepted insurance.  Patient was encouraged by neurology to reduce her alcohol   consumption. Given her age, memory impairment, and history of falls, limiting alcohol  use is strongly recommended.  Prioritize physical health through diet, exercise, and sleep. Regular physical activity supports cardiovascular health, improves mood, and helps preserve mobility and independence. Aim for at least 150 minutes of moderate aerobic exercise per week (e.g.,  brisk walking, swimming, gardening). A brain-healthy diet such as the Mediterranean or MIND diet is rich in fruits, vegetables, whole grains, healthy fats, and lean proteins, and has been associated with reduced risk of cognitive decline. Additionally, getting adequate, quality sleep and managing chronic conditions with the help of healthcare providers are essential components of healthy aging.  Continue to stay socially and mentally engaged. Maintaining strong social connections and regularly stimulating your brain can help protect against cognitive decline. This includes staying connected with friends and family, volunteering, or participating in community groups. Mentally engaging activities--such as reading, doing puzzles, playing strategy games, or learning a new language or musical instrument--promote brain plasticity. If you are interested in activities to support cognitive engagement, this site offers a variety of apps and games organized by difficulty level:  https://www.barrowneuro.org/get-to-know-barrow/centers-programs/neurorehabilitation-center/neuro-rehab-apps-and-games/  Consider implementing compensatory strategies to maximize independence and maintain daily functioning. Examples include:  Adhere to routine. Compensatory strategies work best when they are used consistently. Use a planner, calendar, or white board that has the schedule and important events for the day clearly listed to reference and cross off when tasks are complete.  Ask for written information, especially if it is new or unfamiliar (e.g., information  provided at a doctor's appointment).  Create an organized environment. Keep items that can be easily misplaced in a sensible location and get into the habit of always returning the items to those places. Pay attention and reduce distractions. Make a point of focusing attention on information you want to remember. One-on-one interaction is more likely to facilitate attention and minimize distraction. Make eye contact and repeat the information out loud after you hear it. Reduce interruptions or distractions especially when attempting to learn new information.  Create associations. When learning something new, think about and understand the information. Explain it in your own words or try to associate it with something you already know. Take notes to help remember important details. Evaluate goals and plan accordingly. When confronted by many different tasks, begin by making a list that prioritizes each task and estimates the time it will take to complete. Break down complicated tasks into smaller, more manageable steps. Focus on one task at a time and complete each task before starting another. Avoid multitasking.  Performance across neurocognitive testing is not a strong predictor of an individual's safety operating a motor vehicle. Should her family wish to pursue a formalized driving evaluation, they could reach out to the following agencies:  [employer] in [place]: [phone] [facility] in [place]: [phone] [facility] in [place]: [phone] [facility] in [place]: [phone] or [phone]  If patient requires legal assistance with durable powers of attorney, medical decision making, long-term care resource access, or other aspects of estate planning, they may consider contacting [employer] at [phone] for a free consultation.  DISPOSITION   Patient will follow up with the referring provider, Ms. Wertman.  She should return for repeat neuropsychological testing in 18-24 months to monitor her course and assist with diagnosis and treatment planning. She and her husband will be provided verbal feedback in approximately one week regarding the findings and impression during this visit.  The remainder of the report includes the details of the patient's background and a table of results from the current evaluation, which support the summary and recommendations described above.  BACKGROUND   History of Presenting Illness: The following information was obtained from a review of medical records and an interview with the patient and her husband,  Edward. Briefly, the patient has been followed by Camie Sevin, PA-C, at [facility] since July 2024. She initially presented with persistent post-concussive symptoms following a mechanical fall in May of that year; symptoms have since resolved. She also underwent two EEGs at that time for episodes of brain fuzziness, both of which were unremarkable. More recently, she has expressed concerns regarding short-term memory decline. At her most recent neurology visit on 02/04/2024, she reported increased forgetfulness and repetition in conversations, which her husband has also begun to notice. She notes some improvement with reminders and prompts and remains largely functionally independent despite these concerns. MoCA = 24/30. She was referred for neuropsychological evaluation accordingly.  Cognitive Functioning: During todays appointment, the patient and her husband reported cognitive changes that have been present for over a year and are progressively worsening. Patient noted that symptoms began around the time of a concussion in April 2024, but she feels they are now persistent due to significant hearing loss and reduced cognitive and social stimulation. Currently, cognitive concerns are primarily related to short-term memory, such as misplacing items or forgetting  details of conversations; she notes that memory for conversations varies depending on the importance of the information. Her husband also reports occasionally filling in the blanks for her due to word-finding difficulties. They do not report significant issues with attention, processing speed, visuospatial abilities, or executive functioning.  Physical Functioning: Patient reports no difficulties with sleep initiation or maintenance. Appetite is stable, with no changes in taste or smell. Vision is stable following cataract surgery. She has significant hearing loss and uses hearing aids; however, she notes this is often insufficient. She often carries a microphone connected to her hearing aids to improve communication in noisy environments. She has a history of multiple falls but attributes them to factors other than balance, stating that her balance is generally good; she consistently practices yoga and stretching throughout the week. She denies any tremors.  Emotional Functioning: Patient reports feeling down lately. She states that she misses social interactions and feels that her life is less fulfilling. Her husband notes that she is generally a social person. Much of this appears related to her hearing difficulties, which make holding conversations challenging. She also feels less cognitively stimulated. While she has attempted strategies to remain engaged, her hearing limitations continue to restrict her activities. For example, she would like to volunteer locally but is concerned about interacting effectively with others due to her hearing challenges.  Neuroimaging: MRI of the brain (08/13/2022) documented mild chronic small vessel ischemic changes within the cerebral white matter and mild generalized cerebral atrophy. CTA of the head and neck (06/28/2022) was unremarkable, without evidence of emergent large vessel occlusion, proximal hemodynamically significant stenosis, or acute arterial  injury.  Other Relevant Medical History: Remarkable for hypertension, hyperlipidemia, peripheral artery disease, multinodular goiter, lumbar radiculopathy, lumbar spondylosis, osteoporosis, and gastroesophageal reflux disease. Please refer to the medical record for a more comprehensive problem list. Patient also reported sustaining a concussion without loss of consciousness in April 2024 after falling into her dresser while walking to the restroom at night. She denied any history of stroke, CNS infection, or seizure.  Current Medications: Per record, acetaminophen , atorvastatin , calcium -vitamin D, Centrum Silver, flaxseed, levocetirizine, losartan , metoprolol , omega-3, probiotic, and psyllium.   Functional Status: Patient independently performs all basic and instrumental (e.g., driving, finances, medication) activities of daily living without reported difficulty.   Family Neurological History: Remarkable for Parkinson's disease and a maternal grandfather or great-grandfather (patient unsure which).  Psychiatric History: Remarkable for depression. Symptoms have reportedly developed recently in relation to physical health changes that have limited her social functioning. She occasionally experiences suicidal thoughts but adamantly denies any plan or intent. She has not yet pursued treatment for these symptoms. She otherwise denied a history of anxiety, prior mental health treatment, hallucinations, and psychiatric hospitalizations.  Substance Use History: Patient reported consuming an average of two or more alcoholic drinks per day but has been cutting back since Ms. Wertman recommended doing so, currently attempting to reduce intake to one drink per day. She denied current use of nicotine, marijuana, or illicit substances. There is no reported history of past problematic substance use.  Social and Developmental History: Patient was born in [place], [place]. History of perinatal complications and  developmental delays was not reported. She is married and resides with her husband. They have a son and a daughter.  Educational and Occupational History: No history of childhood learning disability, special education services, or grade retention was reported. 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 She earned an associate degree in language and literature. 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  BEHAVIORAL OBSERVATIONS   Patient arrived on time and was accompanied by her husband, [place]. She ambulated independently and without gait disturbance. She was alert and oriented with the exception of the exact date. She was appropriately groomed and dressed for the setting. No significant motor abnormalities were observed. Vision and hearing (with hearing aids) were adequate for testing purposes, though her hearing was noticeably impaired, as she had indicated. Speech was of normal rate, prosody, and volume. No conversational word-finding difficulties, paraphasic errors, or dysarthria were observed. Comprehension was conversationally intact. Thought processes were linear, logical, and coherent. Thought content was organized and devoid of delusions. Insight appeared appropriate. Affect was even and congruent with euthymic mood, though she became momentarily tearful when discussing limitations in her life related to hearing loss. She was cooperative and appeared to give adequate effort during testing, including on embedded measures of performance validity. Results are thought to accurately reflect her cognitive functioning at this time.  NEUROPSYCHOLOGICAL TESTING RESULTS   Tests Administered: Animal Naming Test; Brief Visuospatial Memory Test-Revised (BVMT-R) - Form 1; Controlled Oral Word Association Test (COWAT): FAS; Delis-Kaplan Executive Function System (D-KEFS) -  Subtest(s): Color-Word Interference Test; Geriatric Anxiety Scale-10 Item (GAS-10); Geriatric Depression Scale Short Form (GDS-SF); Hopkins Verbal Learning Test-Revised (HVLT-R) - From 1; Neuropsychological Assessment Battery (NAB) - Subtest(s): Naming Form 1; Repeatable Battery for the Assessment of Neuropsychological Status Update (RBANS Update) Form A - Subtest(s): Line Orientation; Test of Premorbid Functioning (TOPF); Trail Making Test (TMT); Wechsler Adult Intelligence Scale Fifth Edition (WAIS-5) - Subtest(s): Similarities, Clinical Cytogeneticist, Digits Forward, Digit Sequencing, Coding, Symbol Search, Digits Backward; and Wechsler Memory Scale Fourth Edition (WMS-IV) - Subtest(s): Logical Memory (LM).  Test results are provided in the table below. Whenever possible, the patient's scores were compared against age-, sex-, and education-corrected normative samples. Interpretive descriptions are based on the AACN consensus conference statement on uniform labeling (Guilmette et al., 2020).  PREMORBID FUNCTIONING RAW  RANGE  TOPF 66 StdS=127 Above Average  ATTENTION & WORKING MEMORY RAW  RANGE  WAIS-5 Digits Forward -- ss=18 Exceptionally High  WAIS-5 Digits Backward -- ss=15 Above Average  WAIS-5 Digit Sequencing -- ss=13 High Average  PROCESSING SPEED RAW  RANGE  Trails A  20''0e T=69 Above Average  WAIS-5 Coding  -- ss=14 High Average  WAIS-5 Symbol Search -- ss=12 High Average  DKEFS CWIT Color Naming 23''0e ss=15 Above Average  DKEFS CWIT Word Reading 16''0e ss=15 Above Average  EXECUTIVE FUNCTION RAW  RANGE  Trails B 69''0e T=61 High Average  WAIS-5 Similarities -- ss=9 Average  COWAT Letter Fluency 6+6+8 T=32 Below Average  DKEFS CWIT Inhibition 57''0e ss=14 High Average  DKEFS CWIT Inhibition/Switching 52''1e ss=16 Exceptionally High  LANGUAGE RAW  RANGE  COWAT Letter Fluency 6+6+8 T=32 Below Average  Animal Naming Test 12 T=38 Low Average  NAB Naming Test 30/31 T=63 WNL  VISUOSPATIAL RAW   RANGE  RBANS Line Orientation -- 26-50%ile Average  WAIS-5 Block Design -- ss=9 Average  BVMT-R Copy Trial 12/12 -- WNL  VERBAL LEARNING & MEMORY RAW  RANGE  HVLT-R Learning Trials (6+7+8)/36 T=47 Average  HVLT-R Delayed Recall 1/12 T=26 Exceptionally Low  HVLT-R Recognition Hits 10 -- --  HVLT-R Recognition False Positives 2 -- --  HVLT-R Discrimination Index 8 T=34 Below Average  WMS-IV LM-I  (9+11+9)/53 ss=11 Average  WMS-IV LM-II  (3+0)/39 ss=5 Below Average  WMS-IV LM Recognition  (6+11)/23 26-50%ile Average  VISUAL LEARNING & MEMORY RAW  RANGE  BVMT-R Trial 1 1/12 T=36 Below Average  BVMT-R Trial 2 6/12 T=51 Average  BVMT-R Trial 3 6/12 T=45 Average  BVMT-R Total Recall 13/36 T=44 Average  BVMT-R Delayed Recall 0/12 T=26 Exceptionally Low  BVMT-R Recognition Hits 6 -- --  BVMT-R Recognition False Alarms 1 -- --  BVMT-R Recognition Discrimination Index 5 T=48 Average  *From Powell et al. (2022) -- -- --  QUESTIONNAIRES RAW  RANGE  GDS-SF 2 -- Minimal  GAS-10 6 -- Minimal  *Note: ss = scaled score; StdS = standard score; T = t-score; C/S = corrected raw score; WNL = within normal limits; BNL= below normal limits; D/C = discontinued. Scores from skewed distributions are typically interpreted as WNL (>=16th %ile) or BNL (<16th %ile).   INFORMED CONSENT   Patient was provided with a verbal description of the nature and purpose of the neuropsychological evaluation. Also reviewed were the foreseeable risks and/or discomforts and benefits of the procedure, limits of confidentiality, and mandatory reporting requirements of this provider. Patient was given the opportunity to have their questions answered. Oral consent to participate was provided by the patient.   This report was prepared as part of a clinical evaluation and is not intended for forensic use.  SERVICE   This evaluation was conducted by Renda Beckwith, Psy.D. In addition to time spent directly with the patient, total  professional time (120 minutes) includes record review, integration of relevant medical history, test selection, interpretation of findings, and report preparation. A technician, Lonell Jude, B.S., provided testing and scoring assistance (105 minutes).  Psychiatric Diagnostic Evaluation Services (Professional): [phone] x 1 Neuropsychological Testing Evaluation Services (Professional): [phone] x 1 Neuropsychological Testing Evaluation Services (Professional): [phone] x 1 Neuropsychological Test Administration and Scoring Radiographer, Therapeutic): [phone] x 1 Neuropsychological Test Administration and Scoring (Technician): [phone] x 2  This report was generated using voice recognition software. While this document has been carefully reviewed, transcription errors may be present. I apologize in advance for any inconvenience. Please contact me if further clarification is needed.            Renda Beckwith, Psy.D.             Neuropsychologist  "

## 2024-02-10 NOTE — Telephone Encounter (Signed)
 Pt left voicemail requesting referral to local provider as hour travel is hard for her. Advised Dr. Dena did not know anyone local to her. She requests to stay in [facility] system. Advised we did not have anyone affiliated with [facility] local to her. She would like to make appt with Dr. Dena as she had a fall over Christmas and is feeling good but would like someone to eval. Offered to send referral to outside spine practice if she would like or make appt with Dr. Dena. She would like to schedule with Dr. Dena and cancel if she feels she does not need appt. Message sent to [facility] for scheduling assistance.

## 2024-02-13 ENCOUNTER — Ambulatory Visit (INDEPENDENT_AMBULATORY_CARE_PROVIDER_SITE_OTHER): Admitting: Pharmacist

## 2024-02-13 DIAGNOSIS — E559 Vitamin D deficiency, unspecified: Secondary | ICD-10-CM

## 2024-02-13 DIAGNOSIS — M8580 Other specified disorders of bone density and structure, unspecified site: Secondary | ICD-10-CM

## 2024-02-13 MED ORDER — VITAMIN D3 50 MCG (2000 UT) PO CAPS: 2000.0000 [IU] | ORAL_CAPSULE | Freq: Every day | ORAL | Status: AC

## 2024-02-13 NOTE — Progress Notes (Signed)
 "  02/13/2024 Name: [name] MRN: [id number] DOB: 1940/05/27  Chief Complaint  Patient presents with   Osteopenia    [name] is a 84 y.o. year old female who was referred for medication management by their primary care provider, Joesph Annabella HERO, FNP. They presented for a face to face visit today.   They were referred to the pharmacist by their PCP for assistance in managing osteopenia/bone health.    Subjective:  Very active 84 yo female presents to clinic today to discuss her current bone health treatment plan.  She was started on Reclast  (zoledronic  acid) infusion in May 2025 and tolerated the infusion well.  Prolia was discussed, but likely cost prohibitive.  She reports being a low fall risk.  She reports one past fall about 3 yrs ago, but she did not fracture any bones and recovered well.  Se reports it was an accident where she tripped and fell in the dark at night.  She is active with daily yoga and compliant with her medications.  Care Team: Primary Care Provider: Joesph Annabella HERO, FNP ; Next Scheduled Visit: 06/25/24   Medication Access/Adherence  Current Pharmacy:  [id number] - [place], [place] - [address] [place] [address] Phone: [phone] Fax: [phone]  Patient reports affordability concerns with their medications: No  Patient reports access/transportation concerns to their pharmacy: No  Patient reports adherence concerns with their medications:  No     Osteopenia  Current medications: Reclast  (started in May 2025) Medications tried in the past: n/a; no other prior therapies  Current supplements: taking calcium  600mg  daily and vitamin D 2,000 IUs  Current physical activity: yoga daily, very active 84 yo  Most recent DEXA: 03/04/23  Indications: Advanced Age, Back surgery, Caucasian, Early Menopause, Follow up Osteopenia, Low Body Weight, Parent Hip Fracture, Post Menopausal, Family Hist. (Parent hip fracture)  Fractures: Treatments: Calcium , Multivitamin, Vitamin D DENSITOMETRY RESULTS:  Dual Femur Neck Right 03/04/2023 83.6 Osteopenia -1.8 0.790 g/cm2 Dual Femur Total Mean 03/04/2023 83.6 Normal -0.7 0.923 g/cm2  Right Forearm Radius 33% 03/04/2023 83.6 Osteopenia -1.4 0.615 g/cm2  ASSESSMENT: The BMD measured at Femur Neck Right is 0.790 g/cm2 with a T-score of -1.8. This patient is considered osteopenic according to World Health Organization [facility]) criteria. The scan quality is good. Lumbar spine was excluded due to advanced degenerative changes and surgical repair. Per official position of the ISCD, it is not possible to quantitatively compare BMD or calculate a LSC between different facilities or devices. World Science Writer (WHO) criteria for post-menopausal, Caucasian Women: Normal:       T-score at or above -1 SD Osteopenia:   T-score between -1 and -2.5 SD Osteoporosis: T-score at or below -2.5 SD   Objective:  Lab Results  Component Value Date   HGBA1C 5.5 08/08/2022    Lab Results  Component Value Date   CREATININE 0.67 12/26/2023   BUN 11 12/26/2023   NA 137 02/04/2024   K 4.3 12/26/2023   CL 97 12/26/2023   CO2 26 12/26/2023    Lab Results  Component Value Date   CHOL 188 12/26/2023   HDL 114 12/26/2023   LDLCALC 62 12/26/2023   TRIG 66 12/26/2023   CHOLHDL 1.6 12/26/2023    Medications Reviewed Today     Reviewed by Billee Mliss BIRCH, RPH-CPP (Pharmacist) on 02/13/24 at 1436  Med List Status: <None>   Medication Order Taking? Sig Documenting Provider Last Dose Status Informant  acetaminophen  (  TYLENOL ) 500 MG tablet [id number] Yes Take by mouth. [provider]  Active Self, Pharmacy Records  atorvastatin  (LIPITOR) 40 MG tablet [id number] Yes Take 1 tablet (40 mg total) by mouth daily. Joesph Annabella HERO, FNP  Active   Calcium  Carbonate-Vit D-Min (CALCIUM  1200 PO) [id number] Yes Take 600 mg by mouth 2 (two) times daily. [provider]   Active Self, Pharmacy Records  Cholecalciferol (VITAMIN D3) 50 MCG (2000 UT) capsule [id number] Yes Take 1 capsule (2,000 Units total) by mouth daily. Joesph Annabella HERO, FNP  Active   Flaxseed, Linseed, (FLAX PO) [id number] Yes Take 1 Dose by mouth every morning.  [provider]  Active Self, Pharmacy Records           Med Note GENETTE, VIRGINIA A   Fri Apr 08, 2020 10:39 AM)    levocetirizine (XYZAL ) 5 MG tablet [id number] Yes TAKE 0.5 TABLETS (2.5 MG TOTAL) BY MOUTH EVERY EVENING. Joesph Annabella HERO, FNP  Active   losartan  (COZAAR ) 100 MG tablet [id number] Yes Take 1 tablet (100 mg total) by mouth every morning. Joesph Annabella HERO, FNP  Active   metoprolol  tartrate (LOPRESSOR ) 50 MG tablet [id number] Yes TAKE (1) TABLET TWICE DAILY. Joesph Annabella HERO, FNP  Active   Multiple Vitamins-Minerals (CENTRUM SILVER) tablet [id number] Yes Take 1 tablet by mouth daily. [provider]  Active Self, Pharmacy Records           Med Note BLASE LONELL MARLA Pablo Dec 15, 2018 10:29 PM)    OMEGA 3 1000 MG CAPS [id number] Yes Take 1,000 mg by mouth at bedtime.  [provider]  Active Self, Pharmacy Records           Med Note BLASE, DANA K   Mon Dec 15, 2018 10:30 PM)    Probiotic Product (PROBIOTIC DAILY PO) [id number] Yes Take 1 tablet by mouth daily. [provider]  Active Self, Pharmacy Records  Psyllium (METAMUCIL PO) [id number] Yes Take 1 Scoop by mouth daily. [provider]  Active Self, Pharmacy Records              Assessment/Plan:   Osteoporosis: - Currently appropriately managed Recommend to continue Reclast  infusion--next infusion to be schedule for 06/2024 Continue current supplementation Vitamin D level ordered for future assessment - Reviewed recommendation for daily calcium  intake of 1200 mg and vitamin D intake of [phone] units - Recommended to choose calcium  citrate formulation due to concurrent acid reflux medication - Reviewed benefits of weight  bearing exercise -reports blood pressure has improved to 130-140s/70s at home    Follow Up Plan: Reclast  for 06/2024 will order via infusion navigator  Mliss Tarry Griffin, PharmD, BCACP, CPP Clinical Pharmacist, [facility]   "

## 2024-02-14 ENCOUNTER — Encounter: Payer: Self-pay | Admitting: Family Medicine

## 2024-02-14 ENCOUNTER — Ambulatory Visit

## 2024-02-14 ENCOUNTER — Ambulatory Visit (INDEPENDENT_AMBULATORY_CARE_PROVIDER_SITE_OTHER): Admitting: Family Medicine

## 2024-02-14 VITALS — BP 176/82 | HR 73 | Ht 62.0 in | Wt 116.0 lb

## 2024-02-14 DIAGNOSIS — M545 Low back pain, unspecified: Secondary | ICD-10-CM | POA: Diagnosis not present

## 2024-02-14 DIAGNOSIS — Z981 Arthrodesis status: Secondary | ICD-10-CM

## 2024-02-14 NOTE — Progress Notes (Signed)
 "  BP (!) 176/82   Pulse 73   Ht 5' 2 (1.575 m)   Wt 116 lb (52.6 kg)   LMP  (LMP Unknown)   SpO2 98%   BMI 21.22 kg/m    Subjective:   Patient ID: [name], female    DOB: [birth year], 84 y.o.   MRN: [id number]  HPI: [name] is a 84 y.o. female presenting on 02/14/2024 for Back Pain   Discussed the use of AI scribe software for clinical note transcription with the patient, who gave verbal consent to proceed.  History of Present Illness   [name] is an 84 year old female who presents with back pain after a recent fall.  Lower back pain following fall - Onset after falling down a steep flight of stairs during the recent holiday, landing on buttocks - Pain localized to the lower back with focal tenderness, which has diminished over time - Bending and twisting exacerbate discomfort, particularly at a specific spot in the back - Initially unable to perform leg lifts due to pain, now able to do them again - No worsening of symptoms with physical activity - Pain has been improving - Occasionally takes Advil for pain relief  History of falls and spinal fusion - Spinal fusion performed four years ago with successful outcome - Multiple falls since spinal fusion, including a severe fall two years ago  Physical activity and functional status - Very active for age - Participates in studio yoga once weekly - Performs 20-minute set of core exercises daily - Improvement in ability to perform leg lifts          Relevant past medical, surgical, family and social history reviewed and updated as indicated. Interim medical history since our last visit reviewed. Allergies and medications reviewed and updated.  Review of Systems  Constitutional:  Negative for chills and fever.  Eyes:  Negative for visual disturbance.  Respiratory:  Negative for chest tightness and shortness of breath.   Cardiovascular:  Negative for chest pain and leg swelling.  Musculoskeletal:   Positive for arthralgias and back pain. Negative for gait problem.  Skin:  Negative for rash.  Neurological:  Negative for light-headedness and headaches.  Psychiatric/Behavioral:  Negative for agitation and behavioral problems.   All other systems reviewed and are negative.   Per HPI unless specifically indicated above   Allergies as of 02/14/2024       Reactions   Other Hives   Codeine Other (See Comments)   REACTION: syncope   Hydrochlorothiazide  Other (See Comments)   Penicillins Rash   Can tolerate cephalosporins        Medication List        Accurate as of February 14, 2024  8:46 AM. If you have any questions, ask your nurse or doctor.          acetaminophen  500 MG tablet Commonly known as: TYLENOL  Take by mouth.   atorvastatin  40 MG tablet Commonly known as: LIPITOR Take 1 tablet (40 mg total) by mouth daily.   CALCIUM  1200 PO Take 600 mg by mouth 2 (two) times daily.   Centrum Silver tablet Take 1 tablet by mouth daily.   FLAX PO Take 1 Dose by mouth every morning.   levocetirizine 5 MG tablet Commonly known as: XYZAL  TAKE 0.5 TABLETS (2.5 MG TOTAL) BY MOUTH EVERY EVENING.   losartan  100 MG tablet Commonly known as: COZAAR  Take 1 tablet (100 mg total) by mouth every morning.  METAMUCIL PO Take 1 Scoop by mouth daily.   metoprolol  tartrate 50 MG tablet Commonly known as: LOPRESSOR  TAKE (1) TABLET TWICE DAILY.   Omega 3 1000 MG Caps Take 1,000 mg by mouth at bedtime.   PROBIOTIC DAILY PO Take 1 tablet by mouth daily.   Vitamin D3 50 MCG (2000 UT) capsule Take 1 capsule (2,000 Units total) by mouth daily.         Objective:   BP (!) 176/82   Pulse 73   Ht 5' 2 (1.575 m)   Wt 116 lb (52.6 kg)   LMP  (LMP Unknown)   SpO2 98%   BMI 21.22 kg/m   Wt Readings from Last 3 Encounters:  02/14/24 116 lb (52.6 kg)  01/07/24 115 lb (52.2 kg)  12/26/23 115 lb 12.8 oz (52.5 kg)    Physical Exam Vitals and nursing note reviewed.   Musculoskeletal:     Lumbar back: Tenderness present. No bony tenderness. Normal range of motion. Negative right straight leg raise test and negative left straight leg raise test.       Back:    Physical Exam   MUSCULOSKELETAL: Lumbar spine non-tender to palpation with mild tenderness on twisting.         Assessment & Plan:   Problem List Items Addressed This Visit   None Visit Diagnoses       Acute right-sided low back pain without sciatica    -  Primary   Relevant Orders   DG Lumbar Spine 2-3 Views     History of spinal fusion       Relevant Orders   DG Lumbar Spine 2-3 Views          Low back pain with history of lumbar spinal fusion Chronic low back pain post-lumbar fusion, exacerbated by recent fall, improving with activity modification. - Ordered lumbar spine X-ray to assess hardware integrity. - Continue current exercise regimen, including yoga and core exercises.  Hypertension Occasional Advil use acceptable but limited due to blood pressure concerns. - Advised Tylenol  for pain management instead of Advil.     Await final read from radiology for x-ray     Follow up plan: Return if symptoms worsen or fail to improve.  Counseling provided for all of the vaccine components Orders Placed This Encounter  Procedures   DG Lumbar Spine 2-3 Views    Fonda Levins, MD [facility] 02/14/2024, 8:46 AM     "

## 2024-02-17 ENCOUNTER — Ambulatory Visit: Admitting: Psychology

## 2024-02-17 DIAGNOSIS — F0670 Mild neurocognitive disorder due to known physiological condition without behavioral disturbance: Secondary | ICD-10-CM

## 2024-02-17 NOTE — Progress Notes (Signed)
" ° °  NEUROPSYCHOLOGY FEEDBACK SESSION [place]. [facility]  [place] Department of Neurology  Date of Feedback Session: 02/17/2024  REASON FOR REFERRAL   [name] is an 84 year old, left-handed, White female with 14 years of formal education. She was referred for neuropsychological evaluation by Camie Sevin, PA-C, to assess current neurocognitive functioning, document potential cognitive deficits, and assist with treatment planning. This is her first neuropsychological evaluation.  FEEDBACK   Patient completed a comprehensive neuropsychological evaluation on 02/10/2024. Please refer to that encounter for the full report and recommendations. Briefly, results were largely within expectations, apart from verbal fluency and memory retrieval. 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 Although her hearing loss is significant and likely contributes to cognitive difficulties in daily life, the pattern of cognitive weaknesses observed on evaluation is unlikely to be fully explained by hearing loss alone. There may be some contribution from white matter changes; however, neuroimaging revealed only mild pathology at this time. Additional contributing factors may include low mood, difficulty adjusting to recent health changes, and reduced cognitive and social engagement.   Today, the patient was accompanied by her husband. They were provided verbal feedback regarding the findings and impression during this visit, and their questions were answered. A copy of the report was provided at the conclusion of the visit.  DISPOSITION   Patient will follow up with the referring provider, Ms. Wertman. She should return for repeat neuropsychological testing in 18 to 24 months to monitor her course and assist with diagnosis and treatment planning.  SERVICE   This feedback session was conducted by Renda Beckwith, Psy.D. [place] of [id number] (40 minutes) was billed for Dr. Beckwith' time spent in preparing, conducting, and documenting the current feedback session.  This report was generated using voice recognition software. While this document has been carefully reviewed, transcription errors may be present. I apologize in advance for any inconvenience. Please contact me if further clarification is needed.  "

## 2024-02-18 ENCOUNTER — Telehealth: Payer: Self-pay | Admitting: Family Medicine

## 2024-02-18 NOTE — Telephone Encounter (Signed)
 Still waiting to receive xray results. Will call patient with results once we have received them and provider has reviewed them.   Copied from CRM [id number]. Topic: Clinical - Lab/Test Results >> Feb 18, 2024  1:57 PM Mercer PEDLAR wrote: Reason for CRM: Patient is requesting a callback for back x-ray results.

## 2024-02-19 ENCOUNTER — Telehealth: Payer: Self-pay

## 2024-02-19 NOTE — Telephone Encounter (Signed)
 Advised patient that the xray has not yet been reviewed by radiology, once the results are in we will reach out to relay those results. Patient verbalized understanding.

## 2024-02-19 NOTE — Telephone Encounter (Signed)
 Copied from CRM [id number]. Topic: Clinical - Lab/Test Results >> Feb 19, 2024 12:07 PM Donna BRAVO wrote: Reason for CRM: Patient requesting to speak with nurse regarding x-ray that was done on Friday 02/14/24 >> Feb 19, 2024 12:14 PM Donna BRAVO wrote: [facility] please disregard this CRM routed incorrectly. Routing to [facility]

## 2024-02-21 ENCOUNTER — Ambulatory Visit: Payer: Self-pay | Admitting: Family Medicine

## 2024-03-02 ENCOUNTER — Institutional Professional Consult (permissible substitution): Payer: Self-pay | Admitting: Psychology

## 2024-03-02 ENCOUNTER — Ambulatory Visit: Payer: Self-pay

## 2024-03-13 ENCOUNTER — Ambulatory Visit: Payer: Self-pay

## 2024-03-13 NOTE — Telephone Encounter (Signed)
 Noted.

## 2024-03-13 NOTE — Telephone Encounter (Signed)
" °  FYI Only or Action Required?: Action required by provider: request for appointment.  Patient was last seen in primary care on 02/14/2024 by Dettinger, Fonda LABOR, MD.  Called Nurse Triage reporting Diarrhea.  Symptoms began several days ago.  Interventions attempted: OTC medications: Pepto.  Symptoms are: gradually improving.  Triage Disposition: See PCP When Office is Open (Within 3 Days)  Patient/caregiver understands and will follow disposition?: Yes  Message from Graeme ORN sent at 03/13/2024 11:59 AM EST  Summary: Diarrhea   Reason for Triage: Diarrhea lasting 5 day      Reason for Disposition  [1] MILD diarrhea (e.g., 1-3 or more stools than normal in past 24 hours) AND [2] present >  7 days  (Exception: Chronic diarrhea that is not worse.)  Answer Assessment - Initial Assessment Questions 1. DIARRHEA SEVERITY: How bad is the diarrhea? How many more stools have you had in the past 24 hours than normal?      X 2 / 24 hours  2. ONSET: When did the diarrhea begin?      X 5 days  3. STOOL DESCRIPTION:  How loose or watery is the diarrhea? What is the stool color? Is there any blood or mucous in the stool?      Formed  4. VOMITING: Are you also vomiting? If Yes, ask: How many times in the past 24 hours?      Denies  5. ABDOMEN PAIN: Are you having any abdomen pain? If Yes, ask: What does it feel like? (e.g., crampy, dull, intermittent, constant)       Low level of discomfort  6. ABDOMEN PAIN SEVERITY: If present, ask: How bad is the pain?  (e.g., Scale 1-10; mild, moderate, or severe)      Discomfort, no pain  7. ORAL INTAKE: If vomiting, Have you been able to drink liquids? How much liquids have you had in the past 24 hours?      Taking in fluids  8. HYDRATION: Pt denies dry mouth [not just dry lips], too weak to stand, dizziness, decreased urination  9. EXPOSURE: Have you traveled to a foreign country recently? Have you been exposed to  anyone with diarrhea? Could you have eaten any food that was spoiled?      Pt states she went to [place]  prior to symptoms starting  10. ANTIBIOTIC USE: Are you taking antibiotics now or have you taken antibiotics in the past 2 months?        Denies  11. OTHER SYMPTOMS: Do you have any other symptoms? (e.g., fever, blood in stool)        Denies belching/gas  Pt reports diarrhea Pt is taking OTC Pepto Soonest appointment is on 04/01/24. Routing to clinic to assist with scheduling an in clinic Pt agrees with plan of care, will call back for any worsening symptoms  Protocols used: Diarrhea-A-AH  "

## 2024-03-20 ENCOUNTER — Ambulatory Visit: Admitting: Nurse Practitioner

## 2024-03-23 ENCOUNTER — Encounter: Payer: Self-pay | Admitting: Psychology

## 2024-04-29 ENCOUNTER — Ambulatory Visit: Admitting: Cardiology

## 2024-06-23 ENCOUNTER — Ambulatory Visit

## 2024-06-24 ENCOUNTER — Ambulatory Visit

## 2024-06-25 ENCOUNTER — Ambulatory Visit: Admitting: Family Medicine

## 2025-01-07 ENCOUNTER — Ambulatory Visit
